# Patient Record
Sex: Female | Born: 1940 | Race: White | Hispanic: No | Marital: Married | Smoking: Never smoker
Health system: Southern US, Community
[De-identification: ages and names within clinical notes are randomized; demographics above are authoritative.]

## PROBLEM LIST (undated history)

## (undated) DIAGNOSIS — Z923 Personal history of irradiation: Secondary | ICD-10-CM

## (undated) DIAGNOSIS — Z9221 Personal history of antineoplastic chemotherapy: Secondary | ICD-10-CM

## (undated) DIAGNOSIS — I079 Rheumatic tricuspid valve disease, unspecified: Secondary | ICD-10-CM

## (undated) DIAGNOSIS — I10 Essential (primary) hypertension: Secondary | ICD-10-CM

## (undated) DIAGNOSIS — D649 Anemia, unspecified: Secondary | ICD-10-CM

## (undated) DIAGNOSIS — E785 Hyperlipidemia, unspecified: Secondary | ICD-10-CM

## (undated) DIAGNOSIS — H5030 Unspecified intermittent heterotropia: Secondary | ICD-10-CM

## (undated) DIAGNOSIS — E7800 Pure hypercholesterolemia, unspecified: Secondary | ICD-10-CM

## (undated) DIAGNOSIS — D508 Other iron deficiency anemias: Secondary | ICD-10-CM

## (undated) DIAGNOSIS — E039 Hypothyroidism, unspecified: Secondary | ICD-10-CM

## (undated) DIAGNOSIS — G7000 Myasthenia gravis without (acute) exacerbation: Secondary | ICD-10-CM

## (undated) DIAGNOSIS — M5116 Intervertebral disc disorders with radiculopathy, lumbar region: Secondary | ICD-10-CM

## (undated) DIAGNOSIS — Z853 Personal history of malignant neoplasm of breast: Secondary | ICD-10-CM

## (undated) DIAGNOSIS — Z96653 Presence of artificial knee joint, bilateral: Secondary | ICD-10-CM

## (undated) DIAGNOSIS — M5440 Lumbago with sciatica, unspecified side: Secondary | ICD-10-CM

## (undated) DIAGNOSIS — M1990 Unspecified osteoarthritis, unspecified site: Secondary | ICD-10-CM

## (undated) DIAGNOSIS — M5412 Radiculopathy, cervical region: Secondary | ICD-10-CM

## (undated) DIAGNOSIS — M5417 Radiculopathy, lumbosacral region: Secondary | ICD-10-CM

## (undated) DIAGNOSIS — I82401 Acute embolism and thrombosis of unspecified deep veins of right lower extremity: Secondary | ICD-10-CM

## (undated) DIAGNOSIS — Z01810 Encounter for preprocedural cardiovascular examination: Secondary | ICD-10-CM

## (undated) DIAGNOSIS — C50911 Malignant neoplasm of unspecified site of right female breast: Secondary | ICD-10-CM

## (undated) DIAGNOSIS — R7989 Other specified abnormal findings of blood chemistry: Secondary | ICD-10-CM

## (undated) DIAGNOSIS — E049 Nontoxic goiter, unspecified: Secondary | ICD-10-CM

## (undated) DIAGNOSIS — M8580 Other specified disorders of bone density and structure, unspecified site: Secondary | ICD-10-CM

## (undated) DIAGNOSIS — K2950 Unspecified chronic gastritis without bleeding: Secondary | ICD-10-CM

## (undated) MED ORDER — DICLOFENAC 1 % TOPICAL GEL
1 % | Freq: Four times a day (QID) | CUTANEOUS | Status: DC
Start: ? — End: 2014-02-10

## (undated) MED ORDER — WARFARIN 2 MG TAB
2 mg | ORAL_TABLET | ORAL | Status: DC
Start: ? — End: 2012-11-03

## (undated) MED ORDER — SYNTHROID 112 MCG TABLET
112 mcg | ORAL_TABLET | ORAL | Status: DC
Start: ? — End: 2013-05-06

## (undated) MED ORDER — HYDROCODONE-ACETAMINOPHEN 10-650 MG TAB
10-650 mg | ORAL_TABLET | ORAL | Status: DC | PRN
Start: ? — End: 2012-09-05

## (undated) MED ORDER — SYNTHROID 112 MCG TABLET
112 mcg | ORAL_TABLET | ORAL | Status: DC
Start: ? — End: 2013-05-14

## (undated) MED ORDER — HYDROCODONE-ACETAMINOPHEN 5 MG-325 MG TAB
5-325 mg | ORAL_TABLET | Freq: Four times a day (QID) | ORAL | Status: DC | PRN
Start: ? — End: 2012-09-05

## (undated) MED ORDER — LEVOTHYROXINE 112 MCG TAB
112 mcg | ORAL_TABLET | Freq: Every day | ORAL | Status: DC
Start: ? — End: 2013-07-30

## (undated) MED ORDER — COLESEVELAM 625 MG TAB
625 mg | ORAL_TABLET | Freq: Two times a day (BID) | ORAL | Status: DC
Start: ? — End: 2013-11-19

## (undated) MED ORDER — HYDROCODONE-ACETAMINOPHEN 10 MG-325 MG TAB
10-325 mg | ORAL_TABLET | ORAL | Status: DC | PRN
Start: ? — End: 2012-11-03

## (undated) MED ORDER — LEVOTHYROXINE 112 MCG TAB
112 mcg | ORAL_TABLET | ORAL | Status: DC
Start: ? — End: 2014-07-22

## (undated) MED ORDER — ONDANSETRON 4 MG TAB, RAPID DISSOLVE
4 mg | ORAL_TABLET | Freq: Three times a day (TID) | ORAL | Status: DC | PRN
Start: ? — End: 2012-11-03

## (undated) MED ORDER — SYNTHROID 112 MCG TABLET
112 mcg | ORAL_TABLET | Freq: Every day | ORAL | Status: DC
Start: ? — End: 2008-04-07

## (undated) MED ORDER — SYNTHROID 112 MCG TABLET
112 mcg | ORAL_TABLET | ORAL | Status: DC
Start: ? — End: 2012-11-03

## (undated) MED ORDER — MECLIZINE 25 MG TAB
25 mg | ORAL_TABLET | Freq: Three times a day (TID) | ORAL | Status: DC | PRN
Start: ? — End: 2012-08-15

## (undated) MED ORDER — MECLIZINE 25 MG TAB
25 mg | ORAL_TABLET | Freq: Three times a day (TID) | ORAL | Status: DC | PRN
Start: ? — End: 2012-11-03

## (undated) MED ORDER — DESONIDE 0.05 % LOTION
0.05 % | Freq: Two times a day (BID) | CUTANEOUS | Status: DC
Start: ? — End: 2013-05-06

## (undated) MED ORDER — DULOXETINE 30 MG CAP, DELAYED RELEASE
30 mg | ORAL_CAPSULE | Freq: Every day | ORAL | Status: DC
Start: ? — End: 2012-11-03

## (undated) MED ORDER — DESONIDE 0.05 % OINTMENT
0.05 % | Freq: Two times a day (BID) | CUTANEOUS | Status: AC
Start: ? — End: 2008-10-04

## (undated) MED ORDER — DESONIDE 0.05 % OINTMENT
0.05 % | Freq: Two times a day (BID) | CUTANEOUS | Status: DC
Start: ? — End: 2014-12-16

## (undated) MED ORDER — DICLOFENAC 1 % TOPICAL GEL
1 % | Freq: Four times a day (QID) | CUTANEOUS | Status: DC
Start: ? — End: 2012-09-05

## (undated) MED ORDER — WARFARIN 2 MG TAB
2 mg | ORAL_TABLET | ORAL | Status: DC
Start: ? — End: 2012-09-05

## (undated) MED ORDER — AZITHROMYCIN 250 MG TAB
250 mg | ORAL_TABLET | ORAL | Status: AC
Start: ? — End: 2013-07-04

---

## 1991-08-14 HISTORY — PX: REDUCTION MAMMAPLASTY: SUR839

## 2001-08-31 LAB — OCCULT BLOOD, STOOL: Occult Blood, External: NEGATIVE

## 2001-08-31 LAB — HM PAP SMEAR

## 2003-09-01 LAB — HM COLONOSCOPY: Colonoscopy, External: NEGATIVE

## 2005-08-31 LAB — HM DEXA SCAN

## 2006-08-31 LAB — HM MAMMOGRAPHY

## 2007-10-10 ENCOUNTER — Ambulatory Visit

## 2007-10-10 NOTE — Progress Notes (Signed)
HISTORY OF PRESENT ILLNESS  Jillian Bean is a 66 y.o. female.  HPI       Jillian Bean is seen for well-woman check.    Issues:  1.  Hypothyroidism, TSH 8.3 in December, dose increased to 0.112 MG from 0.1.  She feels well, energy is good.  Due for follow-up.     2.  Ongoing trouble with leg pain keeping her from normal exercise.  It was disrupting sleep in December.  Pain in right leg has improved with course of physical therapy.  MRI of spine showed mild disc bulge, L2-3 but nothing severe.  Dr. Lodema Hong did not think it was back etiology.  Feldene I gave her was helpful, but she is off of it now.  She still has trouble with right knee swelling.  She has seen Dr. Georgina Snell and wonders if she should pursue injections for the knee.  She still has limited exercise.     Preventative Care:  Bone density, December, 2007, T score of 0.3 in spine and -2.1 in hip.  She chose not to take Fosamax at that time.  She does take calcium and vitamin D.  Stress echo, August, 2003, normal.  Colonoscopy, July, 2005, Dr. Vivien Rossetti, normal.  Mammograms, January, 2009, negative.  Status post partial hysterectomy.  Pap smear, January, 2008, normal.  Tetanus booster, January, 2007.           REVIEW OF SYSTEMS  Review of Systems   Constitutional: Negative for fever, chills, weight loss, malaise/fatigue and diaphoresis.   HENT: Negative for hearing loss, ear pain, congestion, sore throat, neck pain and tinnitus.    Eyes: Negative for blurred vision and double vision.   Respiratory: Negative for cough, hemoptysis, sputum production, shortness of breath and wheezing.    Cardiovascular: Negative for chest pain, palpitations, orthopnea, claudication, leg swelling and PND.   Gastrointestinal: Negative for heartburn, nausea, vomiting, abdominal pain, diarrhea, constipation and blood in stool.   Genitourinary: Negative for dysuria, urgency, frequency, hematuria and flank pain.    Musculoskeletal: Positive for back pain and joint pain. Negative for myalgias.   Skin: Negative.    Neurological: Negative for dizziness, tingling, sensory change, speech change, focal weakness, seizures, loss of consciousness, weakness and headaches.   Endo/Heme/Allergies: Negative for environmental allergies and polydipsia.  Does not bruise/bleed easily.   Psychiatric/Behavioral: Negative for depression, suicidal ideas, memory loss and substance abuse.  The patient is not nervous/anxious and does not have insomnia.        PHYSICAL EXAM  Physical Exam   Nursing note and vitals reviewed.  Constitutional: She is oriented. She appears well-developed and well-nourished.   HENT:   Head: Normocephalic and atraumatic.   Right Ear: External ear normal.   Left Ear: External ear normal.   Nose: Nose normal.   Mouth/Throat: Oropharynx is clear and moist.   Eyes: Conjunctivae and extraocular motions are normal. Pupils are equal, round, and reactive to light.   Neck: Normal range of motion. Neck supple. Carotid bruit is not present. No thyromegaly present.   Cardiovascular: Normal rate, regular rhythm, S1 normal, S2 normal, normal heart sounds and intact distal pulses.    Pulmonary/Chest: Effort normal and breath sounds normal.   Abdominal: Soft. Normal appearance and bowel sounds are normal. There is no organomegaly. No tenderness.   Genitourinary:        Bilateral breast scars from prior surgery but no masses, axillary nodes or discharge   Musculoskeletal: Normal range of motion.  Right knee with swelling and crepitus but not red or warm   Neurological: She is alert and oriented.   Skin: Skin is warm, dry and intact.   Psychiatric: She has a normal mood and affect. Her behavior is normal. Judgment and thought content normal.       ASSESSMENT and PLAN  Jillian Bean was seen today for well woman.    Diagnoses and associated orders for this visit:    - Osteopenia  - Dexa w vertebral assessment -- Future    - Hypothyroidism   - Tsh -- Future  - Synthroid 112 mcg tab -- take 1 Tab by mouth daily.    - Eczema  - Desonide 0.05 % ointment -- apply 1 Each to affected area two (2) times a day. Use prn    Dyslipedemia-stable lipids indec on pravachol-will not repeat levels  Hemeoccult cards given  Arthritis-to see Dr Georgina Snell

## 2007-10-11 LAB — TSH 3RD GENERATION: TSH: 3.43 u[IU]/mL (ref 0.35–5.5)

## 2007-10-11 NOTE — Progress Notes (Signed)
Quick Note:    .not  ______

## 2008-01-08 NOTE — Progress Notes (Signed)
Quick Note:    Notified by letter.    ______

## 2008-04-07 MED ORDER — SYNTHROID 112 MCG TABLET
112 mcg | ORAL_TABLET | Freq: Every day | ORAL | Status: DC
Start: 2008-04-07 — End: 2008-04-15

## 2008-04-07 MED ORDER — PRAVASTATIN 20 MG TAB
20 mg | ORAL_TABLET | Freq: Every day | ORAL | Status: DC
Start: 2008-04-07 — End: 2008-11-05

## 2008-04-07 NOTE — Progress Notes (Signed)
HISTORY OF PRESENT ILLNESS  Jillian Bean is a 67 y.o. female.  HPI       Patient is seen for followup of hypothyroidism.   Thyroid ROS: denies fatigue, weight changes, heat/cold intolerance, bowel/skin changes or CVS symptoms. Notes hair loss has resolved in the last few months. She is tolerating pravachol with no myalgias and on co q 10.Notes redness of legs-nt painful or itchy.No new sxs. Did get steroid shots for knees and feels ok with aleve bid.      Review of Systems   Constitutional: Negative for fever, chills and weight loss.   Respiratory: Negative for cough, shortness of breath and wheezing.    Cardiovascular: Negative for chest pain, palpitations, orthopnea, leg swelling and PND.   Gastrointestinal: Negative for heartburn and nausea.   Musculoskeletal: Positive for joint pain (knees). Negative for myalgias.   Skin: Negative for rash and itching.   Neurological: Negative for dizziness and headaches.       Physical Exam   Nursing note and vitals reviewed.  Constitutional: She is oriented. She appears well-developed and well-nourished.   HENT:   Head: Normocephalic and atraumatic.   Neck: Normal range of motion. Neck supple. Carotid bruit is not present. No thyromegaly present.   Cardiovascular: Normal rate, regular rhythm, S1 normal, S2 normal, normal heart sounds and intact distal pulses.    No murmur heard.  Pulmonary/Chest: Effort normal and breath sounds normal. No respiratory distress. She has no wheezes. She has no rales.   Musculoskeletal: She exhibits no edema.   Neurological: She is alert and oriented.   Skin:        Mild venous stasis of legs, no edema and DP 2 plus   Psychiatric: She has a normal mood and affect. Her behavior is normal.       ASSESSMENT and PLAN  Jillian Bean was seen today for follow-up, medication refill and knee pain.    Diagnoses and associated orders for this visit:    - Hypercholesteremia  - Lipid panel -- Future  - Metabolic panel, comprehensive -- Future   - Pravastatin 20 mg tab -- Take 1 Tab by mouth daily.    - Oa (osteoarthritis)  - No associated orders    - Hypothyroidism  - Tsh -- Future  - Synthroid 112 mcg tab -- Take 1 Tab by mouth daily.    - Venous stasis  Reassured re diagnosis, reviewed preventive measures  appt in 6 mo

## 2008-04-15 ENCOUNTER — Encounter

## 2008-04-15 MED ORDER — SYNTHROID 112 MCG TABLET
112 mcg | ORAL_TABLET | Freq: Every day | ORAL | Status: DC
Start: 2008-04-15 — End: 2008-11-05

## 2008-04-21 NOTE — Telephone Encounter (Signed)
Haven't heard anything about recent lab results [phone]

## 2008-04-21 NOTE — Telephone Encounter (Signed)
Advised I would find out about labs and call her back.

## 2008-04-22 NOTE — Telephone Encounter (Signed)
stephaine in lab notified we have missing labs she will work on getting the results.

## 2008-04-28 LAB — METABOLIC PANEL, COMPREHENSIVE
A-G Ratio: 1.8 (ref 1.1–2.2)
ALT (SGPT): 30 U/L (ref 30–65)
AST (SGOT): 17 U/L (ref 15–37)
Albumin: 4.2 g/dL (ref 3.5–5.0)
Alk. phosphatase: 99 U/L (ref 50–136)
Anion gap: 8 mmol/L (ref 5–15)
BUN/Creatinine ratio: 14 (ref 12–20)
BUN: 14 MG/DL (ref 6–20)
Bilirubin, total: 0.5 MG/DL (ref <1.0)
CO2: 29 MMOL/L (ref 21–32)
Calcium: 9 MG/DL (ref 8.5–10.1)
Chloride: 105 MMOL/L (ref 97–108)
Creatinine: 1 MG/DL (ref 0.6–1.3)
GFR est AA: >60 mL/min/{1.73_m2} (ref >60)
GFR est non-AA: 59 mL/min/{1.73_m2} — ABNORMAL LOW (ref >60)
Globulin: 2.4 g/dL (ref 2.0–4.0)
Glucose: 93 MG/DL (ref 50–100)
Potassium: 4.5 MMOL/L (ref 3.5–5.1)
Protein, total: 6.6 g/dL (ref 6.4–8.4)
Sodium: 142 MMOL/L (ref 136–145)

## 2008-04-28 LAB — TSH 3RD GENERATION: TSH: 6.25 u[IU]/mL — ABNORMAL HIGH (ref 0.35–5.5)

## 2008-04-28 LAB — LIPID PANEL
CHOL/HDL Ratio: 3.7 (ref 0–5.0)
Cholesterol, total: 200 MG/DL — ABNORMAL HIGH (ref <200)
HDL Cholesterol: 54 MG/DL (ref 40–60)
LDL, calculated: 125.6 MG/DL — ABNORMAL HIGH (ref 0–100)
Triglyceride: 102 MG/DL (ref 30–200)
VLDL, calculated: 20.4 MG/DL

## 2008-04-29 NOTE — Progress Notes (Signed)
Quick Note:    Please let her know on current thryoid dose of 112 mcg she is slightly under-replaced-advise inc to 8 pills per week and repeat level in 3 mo-other labs are ok  ______

## 2008-04-30 NOTE — Progress Notes (Signed)
Quick Note:    Notified by letter.    ______

## 2008-05-03 NOTE — Progress Notes (Signed)
Quick Note:    Patient notified by phone and agrees to plan  ______

## 2008-07-30 ENCOUNTER — Ambulatory Visit

## 2008-07-30 MED ORDER — FEXOFENADINE 180 MG TAB
180 mg | ORAL_TABLET | Freq: Every day | ORAL | Status: DC
Start: 2008-07-30 — End: 2008-11-05

## 2008-07-30 NOTE — Progress Notes (Signed)
HISTORY OF PRESENT ILLNESS  Jillian Bean is a 67 y.o. female.  HPI       Patient is seen for followup of hypothyroidism.   Thyroid ROS: denies fatigue, weight changes, heat/cold intolerance, bowel/skin changes or CVS symptoms. Her tsh was elevated in September and she is now on synthroid 112 8 days per week.Has noted better hair less hair loss with this dose. She is having left knee pain and has djd and is controlling with aleve bid and tylenol prn. Needs allegra refill which works well.       Review of Systems   Constitutional: Negative for fever, chills and weight loss.   Respiratory: Negative for cough, shortness of breath and wheezing.    Cardiovascular: Negative for chest pain, palpitations, orthopnea, leg swelling and PND.   Gastrointestinal: Negative for heartburn and nausea.   Musculoskeletal: Positive for joint pain. Negative for myalgias.   Neurological: Negative for dizziness and headaches.       Physical Exam   Nursing note and vitals reviewed.  Constitutional: She is oriented. She appears well-developed and well-nourished.   HENT:   Head: Normocephalic and atraumatic.   Neck: Normal range of motion. Neck supple. Carotid bruit is not present. No thyromegaly present.   Cardiovascular: Normal rate, regular rhythm, S1 normal, S2 normal, normal heart sounds and intact distal pulses.    No murmur heard.  Pulmonary/Chest: Effort normal and breath sounds normal. No respiratory distress. She has no wheezes. She has no rales.   Musculoskeletal: She exhibits no edema.   Neurological: She is alert and oriented.   Psychiatric: She has a normal mood and affect. Her behavior is normal.       ASSESSMENT and PLAN  Jillian Bean was seen today for thyroid problem, cholesterol problem and medication refill.    Diagnoses and associated orders for this visit:    Hypothyroid  - TSH, 3RD GENERATION; Future    Hypercholesterolemia  - LIPID PANEL; Future  - ALT; Future  - AST; Future     Oa (osteoarthritis)-Dr Kiritsis suggested for her knee-she anticipates needing surgery-still does the elliptical     Allergic rhinitis  - fexofenadine (ALLEGRA) tablet; Take 1 Tab by mouth daily.    Other Orders  - COENZYME Q10 10 MG CAP; Take  by mouth.    appt in 6 mo sooner prn

## 2008-07-31 LAB — LIPID PANEL
CHOL/HDL Ratio: 3.1 (ref 0–5.0)
Cholesterol, total: 182 MG/DL (ref <200)
HDL Cholesterol: 58 MG/DL (ref 40–60)
LDL, calculated: 107.8 MG/DL — ABNORMAL HIGH (ref 0–100)
Triglyceride: 81 MG/DL (ref 30–200)
VLDL, calculated: 16.2 MG/DL

## 2008-07-31 LAB — ALT: ALT (SGPT): 32 U/L (ref 30–65)

## 2008-07-31 LAB — AST: AST (SGOT): 18 U/L (ref 15–37)

## 2008-07-31 LAB — TSH 3RD GENERATION: TSH: 2.2 u[IU]/mL (ref 0.35–5.5)

## 2008-07-31 NOTE — Progress Notes (Signed)
Quick Note:    Notified by letter.    ______

## 2008-09-08 NOTE — Progress Notes (Addendum)
Quick Note:    Normal paper letter sent  ______

## 2008-11-05 ENCOUNTER — Ambulatory Visit

## 2008-11-05 LAB — LIPID PANEL
CHOL/HDL Ratio: 3 (ref 0–5.0)
Cholesterol, total: 186 MG/DL (ref <200)
HDL Cholesterol: 61 MG/DL — ABNORMAL HIGH (ref 40–60)
LDL, calculated: 106 MG/DL — ABNORMAL HIGH (ref 0–100)
Triglyceride: 95 MG/DL (ref 30–200)
VLDL, calculated: 19 MG/DL

## 2008-11-05 LAB — TSH 3RD GENERATION: TSH: 0.53 u[IU]/mL (ref 0.36–3.74)

## 2008-11-05 LAB — METABOLIC PANEL, COMPREHENSIVE
A-G Ratio: 1.6 (ref 1.1–2.2)
ALT (SGPT): 37 U/L (ref 30–65)
AST (SGOT): 15 U/L (ref 15–37)
Albumin: 3.9 g/dL (ref 3.5–5.0)
Alk. phosphatase: 88 U/L (ref 50–136)
Anion gap: 9 mmol/L (ref 5–15)
BUN/Creatinine ratio: 21 — ABNORMAL HIGH (ref 12–20)
BUN: 17 MG/DL (ref 6–20)
Bilirubin, total: 0.3 MG/DL (ref 0.2–1.0)
CO2: 30 MMOL/L (ref 21–32)
Calcium: 8.8 MG/DL (ref 8.5–10.1)
Chloride: 108 MMOL/L (ref 97–108)
Creatinine: 0.8 MG/DL (ref 0.6–1.3)
GFR est AA: >60 mL/min/{1.73_m2} (ref >60)
GFR est non-AA: >60 mL/min/{1.73_m2} (ref >60)
Globulin: 2.5 g/dL (ref 2.0–4.0)
Glucose: 87 MG/DL (ref 50–100)
Potassium: 4.5 MMOL/L (ref 3.5–5.1)
Protein, total: 6.4 g/dL (ref 6.4–8.4)
Sodium: 147 MMOL/L — ABNORMAL HIGH (ref 136–145)

## 2008-11-05 LAB — CBC WITH AUTOMATED DIFF
ABS. BASOPHILS: 0 10*3/uL (ref 0.0–0.1)
ABS. EOSINOPHILS: 0.1 10*3/uL (ref 0.0–0.4)
ABS. LYMPHOCYTES: 1.1 10*3/uL (ref 0.8–3.5)
ABS. MONOCYTES: 0.4 10*3/uL (ref 0.0–1.0)
ABS. NEUTROPHILS: 3.4 10*3/uL (ref 1.8–8.0)
BASOPHILS: 0 % (ref 0–1)
EOSINOPHILS: 1 % (ref 0–7)
HCT: 36.5 % (ref 35.0–47.0)
HGB: 11.6 g/dL (ref 11.5–16.0)
LYMPHOCYTES: 23 % (ref 12–49)
MCH: 26.4 PG (ref 26.0–34.0)
MCHC: 31.8 g/dL (ref 30.0–36.5)
MCV: 83 FL (ref 80.0–99.0)
MONOCYTES: 7 % (ref 5–13)
NEUTROPHILS: 69 % (ref 32–75)
PLATELET: 346 10*3/uL (ref 150–400)
RBC: 4.4 M/uL (ref 3.80–5.20)
RDW: 14.4 % (ref 11.5–14.5)
WBC: 5 10*3/uL (ref 3.6–11.0)

## 2008-11-05 LAB — URINALYSIS W/ RFLX MICROSCOPIC
Bilirubin: NEGATIVE
Blood: NEGATIVE
Glucose: NEGATIVE MG/DL
Ketone: NEGATIVE MG/DL
Leukocyte Esterase: NEGATIVE
Nitrites: NEGATIVE
Protein: NEGATIVE MG/DL
Specific gravity: 1.018 (ref 1.003–1.030)
Urobilinogen: 0.2 EU/DL (ref 0.2–1.0)
pH (UA): 5.5 (ref 5.0–8.0)

## 2008-11-05 MED ORDER — PRAVASTATIN 20 MG TAB
20 mg | ORAL_TABLET | Freq: Every day | ORAL | Status: DC
Start: 2008-11-05 — End: 2009-09-08

## 2008-11-05 MED ORDER — SYNTHROID 112 MCG TABLET
112 mcg | ORAL_TABLET | Freq: Every day | ORAL | Status: DC
Start: 2008-11-05 — End: 2009-09-08

## 2008-11-05 MED ORDER — FEXOFENADINE 180 MG TAB
180 mg | ORAL_TABLET | Freq: Every day | ORAL | Status: DC
Start: 2008-11-05 — End: 2009-09-08

## 2008-11-05 NOTE — Progress Notes (Signed)
HISTORY OF PRESENT ILLNESS  Jillian Bean is a 68 y.o. female.  HPI  Jillian Bean is seen for a general physical and follow up on thyroid.     Preventive Care: DEXA in May 2009, T score of -.5 in spine, -2.1 in the hip, this was actually improved from prior study.  She gets regular mammograms and Pap smears, over the last years have all been normal.  Colonoscopy 2005 Dr. Vivien Rossetti, due for follow up this summer.  Tetanus booster 2007, Pneumovax 2007.    Second Issue:  Knee pain, did see Dr. Dorina Hoyer, had steroid shots which were helpful.  Thinking about SynVisc injections.  She notes occasional tingling in her feet and some trouble with hard stools, otherwise feels well.     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  She is married.  No tobacco, no alcohol.        Review of Systems   Gastrointestinal: Positive for constipation.   Neurological: Positive for tingling (in feet occasionally).   All other systems reviewed and are negative.        Physical Exam   Nursing note and vitals reviewed.  Constitutional: She is oriented. She appears well-developed and well-nourished.   HENT:   Head: Normocephalic and atraumatic.   Right Ear: External ear normal.   Left Ear: External ear normal.   Nose: Nose normal.   Mouth/Throat: Oropharynx is clear and moist.   Eyes: Conjunctivae and extraocular motions are normal. Pupils are equal, round, and reactive to light.   Neck: Normal range of motion. Neck supple. Carotid bruit is not present. No thyromegaly present.   Cardiovascular: Normal rate, regular rhythm, S1 normal, S2 normal, normal heart sounds and intact distal pulses.    Pulmonary/Chest: Effort normal and breath sounds normal.   Abdominal: Soft. Normal appearance and bowel sounds are normal. She exhibits no mass. There is no organomegaly. No tenderness.   Genitourinary:        Breast exam no masses axillary nodes or discharge    Musculoskeletal: Normal range of motion. She exhibits no edema and no tenderness.        oa changes of knees bialterally   Lymphadenopathy:     She has no cervical adenopathy.   Neurological: She is alert and oriented. She has normal reflexes. Coordination normal.   Skin: Skin is warm, dry and intact.   Psychiatric: She has a normal mood and affect. Her behavior is normal. Judgment and thought content normal.       ASSESSMENT and PLAN  Jillian Bean was seen today for physical, tingling, constipation and medication refill.    Diagnoses and associated orders for this visit:    Well woman exam (no gynecological exam)  - CBC W/ AUTOMATED DIFF; Future  - METABOLIC PANEL, COMPREHENSIVE; Future  - URINALYSIS W/ RFLX MICROSCOPIC; Future    Hypercholesteremia  - LIPID PANEL; Future  - pravastatin (PRAVACHOL) tablet; Take 1 Tab by mouth daily.    Oa (osteoarthritis)    Hypothyroidism  - TSH, 3RD GENERATION; Future  - SYNTHROID 112 MCG TAB; Take 1 Tab by mouth daily.    Doe (dyspnea on exertion)-mild and not significantly different from last year-ekg ok  - AMB POC EKG ROUTINE W/ 12 LEADS, INTER & REP  - XR CHEST PA AND LATERAL; Future    Allergic rhinitis  - fexofenadine (ALLEGRA) tablet; Take 1 Tab by mouth daily.

## 2008-11-07 NOTE — Progress Notes (Signed)
Quick Note:    Can you notify her of normal cxr  ______

## 2008-11-08 NOTE — Progress Notes (Signed)
Quick Note:    Patient notified by phone  ______

## 2008-11-13 NOTE — Progress Notes (Signed)
Quick Note:    Notified by letter.    ______

## 2009-02-23 MED ORDER — MELOXICAM 7.5 MG TAB
7.5 mg | ORAL_TABLET | Freq: Every day | ORAL | Status: DC
Start: 2009-02-23 — End: 2009-04-27

## 2009-02-23 NOTE — Progress Notes (Signed)
HISTORY OF PRESENT ILLNESS  Jillian Bean is a 68 y.o. female.  HPI  Notes started water aerobics in may-was having pain in the back of her legs and did see Dr Jillian Bean who treated her with steroids which helped he advised mri but she did not do this due to anxiety. Does note her legs are better although has known djd of knees and has seen Dr Jillian Bean who offered TKR-she tried 1/2 of a 75 mg diclofenac bid and this helped the swelling but not the pain over 1 week of use.Feldene in the past made her feel fluttery and red face. Aleve helps sxs-no falls.  Review of Systems   HENT: Positive for neck pain.    Gastrointestinal: Negative for heartburn, nausea, vomiting, abdominal pain and blood in stool.   Musculoskeletal: Positive for back pain. Negative for falls.       Physical Exam   Nursing note and vitals reviewed.  Constitutional: She is oriented to person, place, and time. She appears well-developed and well-nourished.   HENT:   Head: Normocephalic and atraumatic.   Neck: Normal range of motion. Neck supple. Carotid bruit is not present. No thyromegaly present.   Cardiovascular: Normal rate, regular rhythm, S1 normal, S2 normal, normal heart sounds and intact distal pulses.    No murmur heard.  Pulmonary/Chest: Effort normal and breath sounds normal. No respiratory distress. She has no wheezes. She has no rales.   Musculoskeletal: Normal range of motion. She exhibits no edema and no tenderness.   Neurological: She is alert and oriented to person, place, and time. She has normal reflexes. Coordination normal.   Psychiatric: She has a normal mood and affect. Her behavior is normal.       ASSESSMENT and PLAN  Jillian Bean was seen today for neck pain.    Diagnoses and associated orders for this visit:    Neck pain-suspect djd as present in knees and back-no red flags of disc rupture-trial of low dose mobic  Side effects possible reviewed in detail    - XR SPINE CERV SINGLE VIEW CROSS TABLE LAT; Future   - meloxicam (MOBIC) tablet; Take 1 Tab by mouth daily for 360 days.    Other Orders  - TUMS PO; Take  by mouth.    Follow up if signs and symptoms worsen or change. After hours number given.

## 2009-03-01 NOTE — Progress Notes (Signed)
Quick Note:    Notify nothing acute on cervical spine films  ______

## 2009-03-03 NOTE — Progress Notes (Signed)
Quick Note:    Notified husband of djd -trial of mobic and appt if not helping  ______

## 2009-03-04 NOTE — Progress Notes (Signed)
Quick Note:    Patient husband notified by phone  ______

## 2009-04-27 ENCOUNTER — Ambulatory Visit

## 2009-04-27 NOTE — Progress Notes (Signed)
HISTORY OF PRESENT ILLNESS  Jillian Bean is a 68 y.o. female.  HPI    1. Seen at follow up concerning hypothyroidism.  She is on 112 mcg dose but two tabs on Sunday.  She notes increased hair loss recently and is worried this is from the thyroid.  2. She has hypercholesterolemia and is tolerating Pravachol 20 mg daily.  Due for levels, no myalgias.  3. Osteoarthritis of knees.  Has had cortisone injection by Dr. Dorina Hoyer.  Mobic did not help as much as Aleve does.  She is anticipating joint replacement in the new year.    MedDATA/jal      Review of Systems   Constitutional: Negative for fever, chills and weight loss.   Respiratory: Negative for cough, shortness of breath and wheezing.    Cardiovascular: Negative for chest pain, palpitations, orthopnea, leg swelling and PND.   Gastrointestinal: Negative for heartburn, nausea and abdominal pain.   Musculoskeletal: Positive for joint pain. Negative for myalgias.   Neurological: Negative for dizziness and headaches.       Physical Exam   Nursing note and vitals reviewed.  Constitutional: She is oriented to person, place, and time. She appears well-developed and well-nourished.   HENT:   Head: Normocephalic and atraumatic.   Neck: Normal range of motion. Neck supple. Carotid bruit is not present. No thyromegaly present.   Cardiovascular: Normal rate, regular rhythm, S1 normal, S2 normal, normal heart sounds and intact distal pulses.    No murmur heard.  Pulmonary/Chest: Effort normal and breath sounds normal. No respiratory distress. She has no wheezes. She has no rales.   Musculoskeletal: She exhibits no edema.   Neurological: She is alert and oriented to person, place, and time.   Psychiatric: She has a normal mood and affect. Her behavior is normal.       ASSESSMENT and PLAN  Jillian Bean was seen today for follow-up, thyroid problem and cholesterol problem.    Diagnoses and associated orders for this visit:    Oa (osteoarthritis)    Hypothyroidism   - TSH, 3RD GENERATION; Future    Hypercholesterolemia  - LIPID PANEL; Future  - METABOLIC PANEL, COMPREHENSIVE; Future    Other Orders  - naproxen sodium (ALEVE) 220 mg tablet; Take 220 mg by mouth two (2) times daily (with meals).    Side effects possible reviewed in detail  appt in 84mo sooner prn  Flu shot advised this fall.

## 2009-04-28 LAB — LIPID PANEL
CHOL/HDL Ratio: 3.3 (ref 0–5.0)
Cholesterol, total: 197 MG/DL (ref <200)
HDL Cholesterol: 60 MG/DL
LDL, calculated: 113.2 MG/DL — ABNORMAL HIGH (ref 0–100)
Triglyceride: 119 MG/DL (ref <150)
VLDL, calculated: 23.8 MG/DL

## 2009-04-28 LAB — METABOLIC PANEL, COMPREHENSIVE
A-G Ratio: 1.2 (ref 1.1–2.2)
ALT (SGPT): 25 U/L (ref 12–78)
AST (SGOT): 20 U/L (ref 15–37)
Albumin: 4.1 g/dL (ref 3.5–5.0)
Alk. phosphatase: 101 U/L (ref 50–136)
Anion gap: 9 mmol/L (ref 5–15)
BUN/Creatinine ratio: 16 (ref 12–20)
BUN: 14 MG/DL (ref 6–20)
Bilirubin, total: 0.3 MG/DL (ref 0.2–1.0)
CO2: 28 MMOL/L (ref 21–32)
Calcium: 8.8 MG/DL (ref 8.5–10.1)
Chloride: 102 MMOL/L (ref 97–108)
Creatinine: 0.9 MG/DL (ref 0.6–1.3)
GFR est AA: >60 mL/min/{1.73_m2} (ref >60)
GFR est non-AA: >60 mL/min/{1.73_m2} (ref >60)
Globulin: 3.4 g/dL (ref 2.0–4.0)
Glucose: 112 MG/DL — ABNORMAL HIGH (ref 65–100)
Potassium: 4.2 MMOL/L (ref 3.5–5.1)
Protein, total: 7.5 g/dL (ref 6.4–8.2)
Sodium: 139 MMOL/L (ref 136–145)

## 2009-04-28 LAB — TSH 3RD GENERATION: TSH: 2.17 u[IU]/mL (ref 0.36–3.74)

## 2009-04-29 NOTE — Progress Notes (Signed)
Quick Note:    Notified by letter.    ______

## 2009-05-16 MED ORDER — ESOMEPRAZOLE MAGNESIUM 40 MG CAP, DELAYED RELEASE
40 mg | ORAL_CAPSULE | Freq: Every day | ORAL | Status: DC
Start: 2009-05-16 — End: 2009-07-13

## 2009-05-16 NOTE — Progress Notes (Signed)
HISTORY OF PRESENT ILLNESS  Jillian Bean is a 68 y.o. female.  HPI  Seen with GI symptoms. Notes about a week and a-half ago before traveling she was having some epigastric discomfort,  heartburn.  Took Pepto Bismol and the stools got dark, but then this cleared.  She has had some lower abdominal cramping. No blood in stool.  No nausea/vomiting, hematemesis, no fever or chills, no dysuria or hematuria.  She does take Aleve for osteoarthritis.  She has been on no recent antibiotics.  He appetite is good.     MedDATA/rac        Review of Systems   Constitutional: Negative for fever, chills and weight loss.   Gastrointestinal: Positive for heartburn and abdominal pain. Negative for vomiting, diarrhea, blood in stool and melena.   Genitourinary: Negative for dysuria and hematuria.       Physical Exam   Nursing note and vitals reviewed.  Constitutional: She is oriented to person, place, and time. She appears well-developed and well-nourished.   HENT:   Head: Normocephalic and atraumatic.   Neck: Normal range of motion. Neck supple.   Cardiovascular: Normal rate, regular rhythm, S1 normal, S2 normal, normal heart sounds and intact distal pulses.    No murmur heard.  Pulmonary/Chest: Effort normal and breath sounds normal. No respiratory distress. She has no wheezes. She has no rales.   Abdominal: Soft. Bowel sounds are normal. She exhibits no distension and no mass. Tenderness (mild in rlq and epigastrium) is present. She has no rebound.   Musculoskeletal: She exhibits no edema.   Neurological: She is alert and oriented to person, place, and time.   Psychiatric: She has a normal mood and affect. Her behavior is normal.       ASSESSMENT and PLAN  Jillian Bean was seen today for epigastric pain.    Diagnoses and associated orders for this visit:    Gerd (gastroesophageal reflux disease)-stop the aleve and start nexium  - esomeprazole (NEXIUM) 40 mg capsule; Take 1 Cap by mouth daily for 360 days.     Abdominal cramping-may be from above-colonoscopy 2 mo ago was normal and no diverticulosis seen    Oa (osteoarthritis)-tylenol prn only  Follow up if signs and symptoms worsen or change. After hours number given.

## 2009-05-26 NOTE — Telephone Encounter (Signed)
[facility] 05-26-09 9:57am mhouchens Dr Darl Pikes pt dob [id number]-2041 pt requesting the name and phone number of a neurologist. Best number to reach pt (h) [phone]

## 2009-05-26 NOTE — Telephone Encounter (Signed)
epps and gatusalo numbers given

## 2009-07-08 NOTE — Telephone Encounter (Signed)
Called pt left a message for her to contact office regarding symptoms. Left a message.

## 2009-07-13 NOTE — Telephone Encounter (Signed)
Pt called to state she stopped nexium due to possible diarrhea from that med. She will have knee surgery by Dr. Dorina Hoyer and will this office if pre op clearance is need for January surgery. Pt thinks ortho will be doing that but will call to clarify.

## 2009-07-13 NOTE — Telephone Encounter (Addendum)
I am happy to see her for preop if she needs clearance from me she needs appt-thanks

## 2009-07-25 ENCOUNTER — Encounter

## 2009-08-18 LAB — METABOLIC PANEL, COMPREHENSIVE
A-G Ratio: 1.1 (ref 1.1–2.2)
ALT (SGPT): 33 U/L (ref 12–78)
AST (SGOT): 18 U/L (ref 15–37)
Albumin: 3.9 g/dL (ref 3.5–5.0)
Alk. phosphatase: 110 U/L (ref 50–136)
Anion gap: 7 mmol/L (ref 5–15)
BUN/Creatinine ratio: 12 (ref 12–20)
BUN: 11 MG/DL (ref 6–20)
Bilirubin, total: 0.2 MG/DL (ref 0.2–1.0)
CO2: 32 MMOL/L (ref 21–32)
Calcium: 9.4 MG/DL (ref 8.5–10.1)
Chloride: 101 MMOL/L (ref 97–108)
Creatinine: 0.9 MG/DL (ref 0.6–1.3)
GFR est AA: >60 mL/min/{1.73_m2} (ref >60)
GFR est non-AA: >60 mL/min/{1.73_m2} (ref >60)
Globulin: 3.5 g/dL (ref 2.0–4.0)
Glucose: 111 MG/DL — ABNORMAL HIGH (ref 65–100)
Potassium: 3.5 MMOL/L (ref 3.5–5.1)
Protein, total: 7.4 g/dL (ref 6.4–8.2)
Sodium: 140 MMOL/L (ref 136–145)

## 2009-08-18 LAB — URINALYSIS W/ RFLX MICROSCOPIC
Bilirubin: NEGATIVE
Blood: NEGATIVE
Glucose: NEGATIVE MG/DL
Ketone: NEGATIVE MG/DL
Leukocyte Esterase: NEGATIVE
Nitrites: NEGATIVE
Protein: NEGATIVE MG/DL
Specific gravity: 1.011 (ref 1.003–1.030)
Urobilinogen: 0.2 EU/DL (ref 0.2–1.0)
pH (UA): 6.5 (ref 5.0–8.0)

## 2009-08-18 LAB — CBC WITH AUTOMATED DIFF
ABS. BASOPHILS: 0 10*3/uL (ref 0.0–0.1)
ABS. EOSINOPHILS: 0.2 10*3/uL (ref 0.0–0.4)
ABS. LYMPHOCYTES: 1.2 10*3/uL (ref 0.8–3.5)
ABS. MONOCYTES: 0.3 10*3/uL (ref 0.0–1.0)
ABS. NEUTROPHILS: 4.5 10*3/uL (ref 1.8–8.0)
BASOPHILS: 1 % (ref 0–1)
EOSINOPHILS: 3 % (ref 0–7)
HCT: 35.1 % (ref 35.0–47.0)
HGB: 10.9 g/dL — ABNORMAL LOW (ref 11.5–16.0)
LYMPHOCYTES: 19 % (ref 12–49)
MCH: 26.1 PG (ref 26.0–34.0)
MCHC: 31.1 g/dL (ref 30.0–36.5)
MCV: 84.2 FL (ref 80.0–99.0)
MONOCYTES: 5 % (ref 5–13)
NEUTROPHILS: 72 % (ref 32–75)
PLATELET: 360 10*3/uL (ref 150–400)
RBC: 4.17 M/uL (ref 3.80–5.20)
RDW: 13.6 % (ref 11.5–14.5)
WBC: 6.2 10*3/uL (ref 3.6–11.0)

## 2009-08-18 LAB — PROTHROMBIN TIME + INR
INR: 1 (ref 0.9–1.1)
Prothrombin time: 10.4 SECS (ref 9.0–11.0)

## 2009-08-18 LAB — PTT: aPTT: 28.7 s (ref 24.0–33.0)

## 2009-08-19 LAB — CULTURE, MRSA

## 2009-08-20 LAB — CULTURE, URINE
Colonies Counted: <1000
Colony Count: <1000
Culture result:: NO GROWTH
Culture: NO GROWTH

## 2009-08-22 LAB — HGB & HCT
HCT: 30.4 % — ABNORMAL LOW (ref 35.0–47.0)
HGB: 9.6 g/dL — ABNORMAL LOW (ref 11.5–16.0)

## 2009-08-22 NOTE — Op Note (Signed)
Op  Notes filed by Delfin Edis, MD at 08/24/09 0740                Author: Delfin Edis, MD  Service: --  Author Type: Physician       Filed: 08/24/09 0740  Date of Service: 08/22/09 1225  Status: Addendum          Editor: Delfin Edis, MD (Physician)          <!--EPICS--> Name:      Jillian Bean, Jillian Bean<BR> MR #:      [id number]                    Surgeon:        Waynette Buttery, M.D.<BR> Account #: [id number]                  Surgery Date:   08/22/2009<BR> DOB:       Sep 10, 1940<BR> Age:       69                           Location:       [id number]<BR> <BR>                              OPERATIVE REPORT<BR> <BR> <BR> PREOPERATIVE DIAGNOSIS:  Left knee osteoarthritis.<BR>  <BR> POSTOPERATIVE DIAGNOSIS:  Left knee osteoarthritis.<BR> <BR> OPERATIVE PROCEDURE:  Left Biomet Vanguard total knee arthroplasty with<BR> Bean-poly.<BR> <BR> SURGEON:  Blima Rich Kaion Tisdale, MD<BR> <BR> ASSISTANT:  Noah Charon, NP<BR> <BR> ANESTHESIA:   General with continuous femoral block.<BR> <BR> COMPLICATIONS:  None.<BR> <BR> ESTIMATED BLOOD LOSS:  5 mL.<BR> <BR> INDICATION:  The patient is a 69 year old female who has had a long history<BR> of left knee pain. She has failed conservative treatment  and presents for<BR> definitive operative care.<BR> <BR> PROCEDURE:  After being informed of the risks and benefits, which include,<BR> but are not limited to, bleeding, infection, neurovascular damage, wound<BR> complications, and pain and stiffness  of the knee, the patient consented<BR> for the procedure.<BR> <BR> With adequate general anesthesia, the left knee was prepped and draped in a<BR> sterile fashion. A standard midline incision was then made and a medial<BR> arthrotomy was performed. The  patella was everted and 9 mm was resected to<BR> accommodate a 34-mm patellar button. Three lug holes were then drilled and<BR> a patella trial was then placed. External tibial alignment guide was then<BR> brought proximally and pinned into  position.  The tibial cut was performed<BR> with the oscillating saw. The cut portion of the tibia was removed, and the<BR> tibia was sized to a 71. The distal femur was then entered with a drill and<BR> the distal femoral alignment rod and cutting block were pinned  into<BR> position. The distal femoral cut was then performed with the oscillating<BR> saw. The femur was sized to a 70 and the 4-in-1 cutting block and box<BR> cutting guides were used to finalize preparation of the femur. The trials<BR> were then placed  and with a 16-mm bearing shift, she had full extension,<BR> full flexion, and equal ligamentous balancing throughout the range of<BR> motion. We ended up with a 16 because we did have to re-cut the tibia. It<BR> was initially cut in a slight bit of valgus,  and we wanted to make sure<BR> that we had her anatomic. The appropriate rotation of the tibia was marked,<BR> and the keel punch was used to finalize the preparation  of the proximal<BR> tibia. All bony surfaces were then irrigated and dried meticulously.<BR>  <BR> The final implants were then cemented into position using third generation<BR> cementing techniques. The final 16-mm Bean-poly bearing was clipped into<BR> position on a clean and dry tibial blase plate. The knee was then irrigated<BR> and the extensor  retinaculum was then closed. The skin was closed in<BR> layers. Sterile dressings were applied, and the patient was awakened and<BR> taken to the recovery room in stable condition. There were no<BR> complications.<BR> <BR> <BR> <BR> <BR> Bean-Signed By<BR>  Waynette Buttery, M.D. 08/24/2009 07:40<BR> Waynette Buttery, M.D.<BR> <BR> cc:   Waynette Buttery, M.D.<BR> <BR> <BR> <BR> PGK/WMX; D: 08/22/2009  9:42 A; T: 08/22/2009 12:25 P; Doc# [id number]; Job#<BR> [id number]<BJ> <!--EPICE-->

## 2009-08-22 NOTE — Op Note (Addendum)
Name: Jillian Bean, Jillian Bean  MR #: [id number] Surgeon: Waynette Buttery, M.D.  Account #: [id number] Surgery Date: 08/22/2009  DOB: 01-Aug-1941  Age: 69 Location: [id number]     OPERATIVE REPORT      PREOPERATIVE DIAGNOSIS: Left knee osteoarthritis.    POSTOPERATIVE DIAGNOSIS: Left knee osteoarthritis.    OPERATIVE PROCEDURE: Left Biomet Vanguard total knee arthroplasty with  E-poly.    SURGEON: Gena Fray, MD    ASSISTANT: Noah Charon, NP    ANESTHESIA: General with continuous femoral block.    COMPLICATIONS: None.    ESTIMATED BLOOD LOSS: 5 mL.    INDICATION: The patient is a 69 year old female who has had a long history  of left knee pain. She has failed conservative treatment and presents for  definitive operative care.    PROCEDURE: After being informed of the risks and benefits, which include,  but are not limited to, bleeding, infection, neurovascular damage, wound  complications, and pain and stiffness of the knee, the patient consented  for the procedure.    With adequate general anesthesia, the left knee was prepped and draped in a  sterile fashion. A standard midline incision was then made and a medial  arthrotomy was performed. The patella was everted and 9 mm was resected to  accommodate a 34-mm patellar button. Three lug holes were then drilled and  a patella trial was then placed. External tibial alignment guide was then  brought proximally and pinned into position. The tibial cut was performed  with the oscillating saw. The cut portion of the tibia was removed, and the  tibia was sized to a 71. The distal femur was then entered with a drill and  the distal femoral alignment rod and cutting block were pinned into  position. The distal femoral cut was then performed with the oscillating  saw. The femur was sized to a 70 and the 4-in-1 cutting block and box  cutting guides were used to finalize preparation of the femur. The trials   were then placed and with a 16-mm bearing shift, she had full extension,  full flexion, and equal ligamentous balancing throughout the range of  motion. We ended up with a 16 because we did have to re-cut the tibia. It  was initially cut in a slight bit of valgus, and we wanted to make sure  that we had her anatomic. The appropriate rotation of the tibia was marked,  and the keel punch was used to finalize the preparation of the proximal  tibia. All bony surfaces were then irrigated and dried meticulously.    The final implants were then cemented into position using third generation  cementing techniques. The final 16-mm E-poly bearing was clipped into  position on a clean and dry tibial blase plate. The knee was then irrigated  and the extensor retinaculum was then closed. The skin was closed in  layers. Sterile dressings were applied, and the patient was awakened and  taken to the recovery room in stable condition. There were no  complications.          E-Signed By  Waynette Buttery, M.D. 08/24/2009 07:40  Waynette Buttery, M.D.    cc: Waynette Buttery, M.D.        PGK/WMX; D: 08/22/2009 9:42 A; T: 08/22/2009 12:25 P; Doc# [id number]; Job#  [id number]

## 2009-08-22 NOTE — Consults (Addendum)
Name: Jillian Bean, Jillian Bean Admitted: 08/22/2009  MR #: [id number] DOB: [birth year]  Account #: [id number] Age: 69  Consultant: Berkeley Veldman P. Robby Sermon, MD Location: [phone]     CONSULTATION REPORT    DATE OF CONSULTATION: 08/22/2009      ATTENDING PHYSICIAN: Dr. Clois Dupes    CONSULTING PHYSICIAN: Dr. Renae Fickle Kiritsis    REASON FOR CONSULT: To manage the patient's medical problems.    CHIEF COMPLAINT: Left knee pain for the past 3 years, status post left  total knee replacement, postoperative day zero.    The patient is a 69 year old, Caucasian female, status post left total knee  replacement, postop day zero. [facility] has been  consulted to manage the patient's medical problems as listed below.    PAST MEDICAL HISTORY: Osteoarthritis, hyperlipidemia, hypothyroidism, neck  spasms.    MEDICATIONS  1. Synthroid 112 mcg q.a.m.  2. Allegra 180 mg q.a.m. p.r.n. seasonal allergies.  3. Multivitamins 1 tablet p.o. daily.  4. Vitamin D3 1000 units q.a.m.  5. Tums EX 2 tabs q.a.m.  6. Coenzyme Q-10 100 mg 1 tab q.a.m.  7. Aspirin 81 mg 1 tab q.a.m.  8. Amrix 15 mg one tablet q.p.m.  9. Pravastatin 20 mg one tablet q.p.m.  10. Colace 100 mg 1 tablet q.p.m.  11. Tylenol 500 mg two tablets q.6 hours p.r.n. headache.    ALLERGIES: YELLOW DYE #5 (HIVES).    PAST SURGICAL HISTORY: Partial hysterectomy in 1973, breast reduction in  1993, left knee arthroscopy for torn meniscus in 2008, and oral surgery.    SOCIAL HISTORY: The patient is married and works as a Building control surveyor at [employer]. Denies smoking, alcohol, or substance  abuse.    FAMILY HISTORY: Mother diagnosed with breast cancer at 33 years-old, which  metastasized to the bones. Mother deceased at 85 years old. Father is  deceased at 25 year old due to MI. Brother with history of multiple  sclerosis.    PREVENTIVE MEDICINE  1. PCP is Dr. Golda Acre.  2. Immunizations are up to date as per patient.   3. Thyroid levels are checked every 6 months, due for a recheck in  February.  4. Lipids are checked every 6 months, also due for a recheck in February.  5. Bone mineral density. Patient does not remember exact date, but states  that previous one was normal.  6. Colonoscopy, August 2010, with normal findings.  7. Mammograms, January 2011, with no abnormal finding.    REVIEW OF SYSTEMS  HEAD: The patient denies headache, no acute visual changes.  EYES: Patient wears contact lenses. Denies blurriness or increased  tearing.  EARS: Denies hearing loss or dizziness.  ENT: Patient complains of sore throat due to intubation from surgery, no  swollen neck.  CARDIOVASCULAR: No history of hypertension, no murmurs, no palpitations,  or chest pain.  RESPIRATORY: Denies shortness of breath, wheezing, or coughing.  GI: Denies abdominal pain, nausea, vomiting. Bowel movements are normal  with hard stools, which the patient takes Colace for.  URINARY: Denies dysuria, frequency, or urgency.  NEUROLOGIC: Denies loss of sensation. No tremors or weakness.    PHYSICAL EXAMINATION  VITAL SIGNS: Temperature 98.4, pulse 77, respirations 16, blood pressure  148/68, O2 saturation 98% on 2 L of oxygen.  GENERAL: The patient is lying in bed comfortably, not in any acute  distress.  EYES: Normal, pink conjunctive.  LUNGS: Clear to auscultation bilaterally.  HEART: Regular rate and rhythm. Normal S1, S2. No murmurs heard.  ABDOMEN: Soft, nontender, nondistended with positive bowel sounds.  EXTREMITIES: No edema or calf tenderness, compression stockings intact  bilaterally.  NEUROLOGIC: Strength 5/5 in upper extremities bilaterally, 5/5 strength in  the right lower extremity. Left lower extremity not assessed due to current  postoperative state. Biceps DTRs 2+ bilaterally.    ASSESSMENTS AND PLANS: The patient is a 69 year old, Caucasian female,  status post left total knee replacement. Today is postoperative day zero.   [facility] will be managing patient's medical problems  including hyperlipidemia, hypothyroidism, neck spasms.  1. Hyperlipidemia. We will continue pravastatin 20 mg q.p.m. We will not  recheck lipid panel since this is followed up with PCP.  2. Hypothyroidism. We will continue Synthroid 112 mcg one tab q.a.m. The  patient will recheck thyroid levels with PCP, due for a recheck in  February.  3. Neck spasms. We will continue Amrix 15 mg q.p.m. for her neck spasms.  4. Gastrointestinal prophylaxis. We will start Protonix 40 mg p.o.    DICTATED BY: Vernia Buff. Rana Snare, MD        Reviewed on 08/22/2009 3:51 PM      E-Signed By  Perfecto Kingdom. Marlisa Caridi, MD 08/31/2009 10:27    Onnika Siebel P. Robby Sermon, MD    cc: Linda Grimmer P. Robby Sermon, MD        GPH/WMX; D: 08/22/2009 2:13 P; T: 08/22/2009 3:19 P; DOC# [id number]; Job#  [id number]

## 2009-08-23 LAB — METABOLIC PANEL, BASIC
Anion gap: 8 mmol/L (ref 5–15)
BUN/Creatinine ratio: 9 — ABNORMAL LOW (ref 12–20)
BUN: 7 MG/DL (ref 6–20)
CO2: 29 MMOL/L (ref 21–32)
Calcium: 8.3 MG/DL — ABNORMAL LOW (ref 8.5–10.1)
Chloride: 99 MMOL/L (ref 97–108)
Creatinine: 0.8 MG/DL (ref 0.6–1.3)
GFR est AA: >60 mL/min/{1.73_m2} (ref >60)
GFR est non-AA: >60 mL/min/{1.73_m2} (ref >60)
Glucose: 134 MG/DL — ABNORMAL HIGH (ref 65–100)
Potassium: 3.5 MMOL/L (ref 3.5–5.1)
Sodium: 136 MMOL/L (ref 136–145)

## 2009-08-23 LAB — CBC W/O DIFF
HCT: 26.9 % — ABNORMAL LOW (ref 35.0–47.0)
HGB: 8.5 g/dL — ABNORMAL LOW (ref 11.5–16.0)
MCH: 26.2 PG (ref 26.0–34.0)
MCHC: 31.6 g/dL (ref 30.0–36.5)
MCV: 82.8 FL (ref 80.0–99.0)
PLATELET: 245 10*3/uL (ref 150–400)
RBC: 3.25 M/uL — ABNORMAL LOW (ref 3.80–5.20)
RDW: 13.4 % (ref 11.5–14.5)
WBC: 5.8 10*3/uL (ref 3.6–11.0)

## 2009-08-23 LAB — PROTHROMBIN TIME + INR
INR: 1.1 (ref 0.9–1.1)
Prothrombin time: 11.1 SECS — ABNORMAL HIGH (ref 9.0–11.0)

## 2009-08-24 LAB — CBC W/O DIFF
HCT: 26.7 % — ABNORMAL LOW (ref 35.0–47.0)
HGB: 8.4 g/dL — ABNORMAL LOW (ref 11.5–16.0)
MCH: 26.3 PG (ref 26.0–34.0)
MCHC: 31.5 g/dL (ref 30.0–36.5)
MCV: 83.4 FL (ref 80.0–99.0)
PLATELET: 299 10*3/uL (ref 150–400)
RBC: 3.2 M/uL — ABNORMAL LOW (ref 3.80–5.20)
RDW: 13.6 % (ref 11.5–14.5)
WBC: 7.7 10*3/uL (ref 3.6–11.0)

## 2009-08-24 LAB — PROTHROMBIN TIME + INR
INR: 1.9 — ABNORMAL HIGH (ref 0.9–1.1)
Prothrombin time: 19.1 SECS — ABNORMAL HIGH (ref 9.0–11.0)

## 2009-08-24 LAB — METABOLIC PANEL, BASIC
Anion gap: 3 mmol/L — ABNORMAL LOW (ref 5–15)
BUN/Creatinine ratio: 10 — ABNORMAL LOW (ref 12–20)
BUN: 8 MG/DL (ref 6–20)
CO2: 32 MMOL/L (ref 21–32)
Calcium: 8.1 MG/DL — ABNORMAL LOW (ref 8.5–10.1)
Chloride: 100 MMOL/L (ref 97–108)
Creatinine: 0.8 MG/DL (ref 0.6–1.3)
GFR est AA: >60 mL/min/{1.73_m2} (ref >60)
GFR est non-AA: >60 mL/min/{1.73_m2} (ref >60)
Glucose: 120 MG/DL — ABNORMAL HIGH (ref 65–100)
Potassium: 3.5 MMOL/L (ref 3.5–5.1)
Sodium: 135 MMOL/L — ABNORMAL LOW (ref 136–145)

## 2009-08-26 NOTE — Procedures (Signed)
Procedures filed by Adah Salvage, MD at 08/31/09 [phone]                Author: Adah Salvage, MD  Service: --  Author Type: Physician       Filed: 08/31/09 0914  Date of Service: 08/26/09 0639  Status: Addendum          Editor: Adah Salvage, MD (Physician)            Procedure Orders        1. US DUPLEX LOWER EXT VEIN Sherre Poot [[id number]] ordered by  at 08/26/09 0639                         <!--EPICS-->                   [place] [facility]<BR>                         [phone]<BR>                             [place], [place] [address]<BR> <BR> <BR> Name:      Jillian Bean, Jillian Bean           Service Date:    08/24/2009<BR> DOB:       [birth year]                Ordered by:<BR> MR #       [id number]                 Location:        [id number]<BR> Sex:       F                          Age:             68<BR> Billing #: [id number]              Date of Adm:     08/22/2009<BR> <BR> <BR>                        LOWER EXTREMITY VENOUS DUPLEX<BR> <BR> <BR> REASON FOR PROCEDURE:  Leg pain.<BR> <BR> FINDINGS:  Pulsed  Doppler spectral analysis shows fully competent deep and<BR> superficial systems. B-mode color imaging shows fully compressible vessels<BR> throughout without thrombosis or obstruction.<BR> <BR> SUMMARY:  Negative lower extremity venous duplex for thrombosis  or<BR> significant reflux.<BR> <BR> E-Signed By<BR> Alvina Filbert, MD 08/31/2009 09:14<BR> Alvina Filbert, MD<BR> <BR> cc:   Waynette Buttery, M.D.<BR>       Alvina Filbert, MD<BR> <BR> <BR> <BR> MW/WMX; D: 08/25/2009  8:36 A; T: 08/26/2009  6:39 A; DOC# [id number];  JOB#<BR> [id number]<BR> <!--EPICE-->

## 2009-08-26 NOTE — Procedures (Addendum)
[place] [facility]   [address]   [place], [place] [address]      Name: Jillian Bean, Jillian Bean Service Date: 08/24/2009  DOB: 19-Jun-1941 Ordered by:  MR # [id number] Location: [id number]  Sex: F Age: 69  Billing #: [id number] Date of Adm: 08/22/2009       LOWER EXTREMITY VENOUS DUPLEX      REASON FOR PROCEDURE: Leg pain.    FINDINGS: Pulsed Doppler spectral analysis shows fully competent deep and  superficial systems. B-mode color imaging shows fully compressible vessels  throughout without thrombosis or obstruction.    SUMMARY: Negative lower extremity venous duplex for thrombosis or  significant reflux.    E-Signed By  Alvina Filbert, MD 08/31/2009 09:14  Alvina Filbert, MD    cc: Waynette Buttery, M.D.   Alvina Filbert, MD        MW/WMX; D: 08/25/2009 8:36 A; T: 08/26/2009 6:39 A; DOC# [id number]; JOB#  [id number]

## 2009-09-08 ENCOUNTER — Encounter

## 2009-09-08 MED ORDER — PRAVASTATIN 20 MG TAB
20 mg | ORAL_TABLET | Freq: Every day | ORAL | Status: DC
Start: 2009-09-08 — End: 2009-12-16

## 2009-09-08 MED ORDER — FEXOFENADINE 180 MG TAB
180 mg | ORAL_TABLET | Freq: Every day | ORAL | Status: AC
Start: 2009-09-08 — End: 2009-12-07

## 2009-09-08 MED ORDER — SYNTHROID 112 MCG TABLET
112 mcg | ORAL_TABLET | Freq: Every day | ORAL | Status: DC
Start: 2009-09-08 — End: 2009-12-16

## 2009-09-08 NOTE — Telephone Encounter (Signed)
Sorry we mail all rx's for mail order to patient's now. We are unable to fax them or call them

## 2009-09-08 NOTE — Telephone Encounter (Signed)
Per patient she needs the following medication to go to mail order please be sure you put the dob, address, and please put the following . Per patient she did not know if this is fax or phone no and it is  [phone] please put down [phone]

## 2009-09-29 NOTE — Progress Notes (Signed)
HISTORY OF PRESENT ILLNESS  Jillian Bean is a 69 y.o. female.  HPI  Seen at follow up.    Issues:  1. She had a left total knee replacement January 10th.  She did well.  Dr. Dorina Hoyer has been happy with her outcome.  She is able to walk downstairs but uses a cane.  She is going back to work this week.  She has no pain at this point.  Only uses Tylenol.  2. She had a cervical spine MRI by Dr. Jettie Pagan.  Has cervical DJD.  Is using muscle relaxers at night which have helped.  Had an incidental finding of her thyroid goiter and will do an ultrasound to follow up.   3. Mild anemia.  Preoperative level was 10.9.  Postop hemoglobin was 9.6.  She is on no iron because it constipates her.  She does have some fatigue.      MedDATA/leh          Review of Systems   Constitutional: Positive for malaise/fatigue. Negative for fever and chills.   Respiratory: Negative for shortness of breath.    Cardiovascular: Negative for chest pain.   Gastrointestinal: Negative for abdominal pain, blood in stool and melena.   Musculoskeletal: Positive for myalgias (around neck spasms). Negative for joint pain.   Neurological: Negative for tremors and sensory change.       Physical Exam   Nursing note and vitals reviewed.  Constitutional: She is oriented to person, place, and time. She appears well-developed and well-nourished.   HENT:   Head: Normocephalic and atraumatic.   Neck: Normal range of motion. Neck supple. Carotid bruit is not present. No thyromegaly present.   Cardiovascular: Normal rate, regular rhythm, S1 normal, S2 normal, normal heart sounds and intact distal pulses.    No murmur heard.  Pulmonary/Chest: Effort normal and breath sounds normal. No respiratory distress. She has no wheezes. She has no rales.   Musculoskeletal: She exhibits no edema.   Neurological: She is alert and oriented to person, place, and time.   Psychiatric: She has a normal mood and affect. Her behavior is normal.       ASSESSMENT and PLAN   Jillian Bean was seen today for follow-up, cholesterol problem and medication refill.    Diagnoses and associated orders for this visit:    Thyroid goiter-incidental on mri-no nodules felt- US THYROID / PARATHYROID; Future    Anemia-mild hgb 10.9 preoop and 9.7 post op-repeat levels  - CBC WITH AUTOMATED DIFF  - FERRITIN    Oa (osteoarthritis)  - METABOLIC PANEL, COMPREHENSIVE    Hypercholesterolemia  - LIPID PANEL

## 2009-09-30 LAB — METABOLIC PANEL, COMPREHENSIVE
A-G Ratio: 1.8 (ref 1.1–2.5)
ALT (SGPT): 11 IU/L (ref 0–40)
AST (SGOT): 15 IU/L (ref 0–40)
Albumin: 4.4 g/dL (ref 3.6–4.8)
Alk. phosphatase: 80 IU/L (ref 25–165)
BUN/Creatinine ratio: 16 (ref 11–26)
BUN: 14 mg/dL (ref 8–27)
Bilirubin, total: 0.3 mg/dL (ref 0.0–1.2)
CO2: 25 mmol/L (ref 20–32)
Calcium: 9.3 mg/dL (ref 8.6–10.2)
Chloride: 103 mmol/L (ref 97–108)
Creatinine: 0.86 mg/dL (ref 0.57–1.00)
GFR est AA: >59 mL/min/{1.73_m2} (ref >59)
GFR est non-AA: >59 mL/min/{1.73_m2} (ref >59)
GLOBULIN, TOTAL: 2.5 g/dL (ref 1.5–4.5)
Glucose: 99 mg/dL (ref 65–99)
Potassium: 4.1 mmol/L (ref 3.5–5.2)
Protein, total: 6.9 g/dL (ref 6.0–8.5)
Sodium: 141 mmol/L (ref 135–145)

## 2009-09-30 LAB — FERRITIN: Ferritin: 188 ng/mL — ABNORMAL HIGH (ref 13–150)

## 2009-09-30 LAB — CBC WITH AUTOMATED DIFF
ABS. BASOPHILS: 0 10*3/uL (ref 0.0–0.2)
ABS. EOSINOPHILS: 0.1 10*3/uL (ref 0.0–0.4)
ABS. IMM. GRANS.: 0 10*3/uL (ref 0.0–0.1)
ABS. LYMPHOCYTES: 1.2 10*3/uL (ref 0.7–4.5)
ABS. MONOCYTES: 0.3 10*3/uL (ref 0.1–1.0)
ABS. NEUTROPHILS: 3.8 10*3/uL (ref 1.8–7.8)
BASOPHILS: 1 % (ref 0–3)
EOSINOPHILS: 2 % (ref 0–7)
HCT: 33.5 % — ABNORMAL LOW (ref 34.0–44.0)
HGB: 10.2 g/dL — ABNORMAL LOW (ref 11.5–15.0)
IMMATURE GRANULOCYTES: 0 % (ref 0–1)
LYMPHOCYTES: 22 % (ref 14–46)
MCH: 25.7 pg — ABNORMAL LOW (ref 27.0–34.0)
MCHC: 30.4 g/dL — ABNORMAL LOW (ref 32.0–36.0)
MCV: 84 fL (ref 80–98)
MONOCYTES: 6 % (ref 4–13)
NEUTROPHILS: 69 % (ref 40–74)
PLATELET: 385 10*3/uL (ref 140–415)
RBC: 3.97 x10E6/uL (ref 3.80–5.10)
RDW: 15.7 % — ABNORMAL HIGH (ref 11.7–15.0)
WBC: 5.5 10*3/uL (ref 4.0–10.5)

## 2009-09-30 LAB — LIPID PANEL
Cholesterol, total: 168 mg/dL (ref 100–199)
HDL Cholesterol: 58 mg/dL (ref >39)
LDL, calculated: 87 mg/dL (ref 0–99)
Triglyceride: 116 mg/dL (ref 0–149)
VLDL, calculated: 23 mg/dL (ref 5–40)

## 2009-10-03 NOTE — Telephone Encounter (Signed)
Discussed anemia-recent surgery-likely still related to this-has trouble with iron causing constipation-advised dietary sources and appt in 31mo to repeat and check b12 levels as well then -other labs are good

## 2009-10-03 NOTE — Progress Notes (Addendum)
Quick Note:    Notified by letter.    ______

## 2009-10-10 NOTE — Progress Notes (Signed)
Quick Note:    Notified of dominant nodule on us-advised endocrine eval and she will make appt and let us know when so we can forward records  ______

## 2009-10-10 NOTE — Progress Notes (Signed)
Quick Note:    Ok - noted  ______

## 2009-10-21 ENCOUNTER — Telehealth

## 2009-10-21 NOTE — Telephone Encounter (Signed)
Patient has appt scheduled with Dr. Marcy Panning - needs referral and records sent to them

## 2009-11-25 NOTE — Telephone Encounter (Addendum)
This is not a referral request so I am also unsure as to what is the next step for this pt's lab results. There is not a telephone encounter indicating this information.

## 2009-11-25 NOTE — Telephone Encounter (Addendum)
Should not need a referral has ppo - faxed records as requested

## 2009-12-16 MED ORDER — PRAVASTATIN 20 MG TAB
20 mg | ORAL_TABLET | Freq: Every day | ORAL | Status: DC
Start: 2009-12-16 — End: 2010-04-13

## 2009-12-16 MED ORDER — SYNTHROID 112 MCG TABLET
112 mcg | ORAL_TABLET | Freq: Every day | ORAL | Status: DC
Start: 2009-12-16 — End: 2010-04-13

## 2009-12-16 NOTE — Progress Notes (Signed)
HISTORY OF PRESENT ILLNESS  Jillian Bean is a 69 y.o. female.  HPI  "Jillian Bean" comes in for follow up.    Issues:    1. Thyroid nodules.  She has worked with Dr. Marcy Panning, had an aspiration which was negative.  Will have a six month follow up ultrasound.  She is due for refill on her Synthroid.  2. Hypercholesterolemia.  Tolerating Pravachol well.  Due for labs.     3. Neck pain with cervical DJD.  Saw Dr. Jettie Pagan.  Has Amrix muscle relaxer she uses about once a week.  Tells me that she would like to have a full Pap smear with her physical in September.  Aware that this is outside of guidelines for screening, but I am happy to do this for her.  She is concerned because she still has intact ovaries.         MedDATA/pag       Review of Systems   Constitutional: Negative for fever, chills and weight loss.   HENT: Positive for neck pain.    Respiratory: Negative for cough, shortness of breath and wheezing.    Cardiovascular: Negative for chest pain, palpitations, orthopnea, leg swelling and PND.   Gastrointestinal: Negative for heartburn and nausea.   Musculoskeletal: Negative for myalgias.   Neurological: Negative for dizziness and headaches.       Physical Exam   Nursing note and vitals reviewed.  Constitutional: She is oriented to person, place, and time. She appears well-developed and well-nourished.   HENT:   Head: Normocephalic and atraumatic.   Neck: Normal range of motion. Neck supple. Carotid bruit is not present. No thyromegaly present.   Cardiovascular: Normal rate, regular rhythm, S1 normal, S2 normal, normal heart sounds and intact distal pulses.    No murmur heard.  Pulmonary/Chest: Effort normal and breath sounds normal. No respiratory distress. She has no wheezes. She has no rales.   Musculoskeletal: She exhibits no edema.   Neurological: She is alert and oriented to person, place, and time.   Psychiatric: She has a normal mood and affect. Her behavior is normal.       ASSESSMENT and PLAN   Alithia was seen today for follow-up, thyroid problem and medication refill.    Diagnoses and associated orders for this visit:    Hypothyroidism  - SYNTHROID 112 mcg tablet; Take 1 Tab by mouth daily for 90 doses.    Hypercholesteremia  - pravastatin (PRAVACHOL) 20 mg tablet; Take 1 Tab by mouth daily for 90 doses.  - LIPID PANEL  - METABOLIC PANEL, COMPREHENSIVE    Oa (osteoarthritis)-cervical djd-cont prn muscle relaxer    Other Orders  - Cyclobenzaprine (AMRIX) 15 mg Cp24 SR capsule; Take 15 mg by mouth daily.      appt for cpe in sept

## 2009-12-17 LAB — METABOLIC PANEL, COMPREHENSIVE
A-G Ratio: 1.8 (ref 1.1–2.5)
ALT (SGPT): 12 IU/L (ref 0–40)
AST (SGOT): 16 IU/L (ref 0–40)
Albumin: 4.3 g/dL (ref 3.5–4.8)
Alk. phosphatase: 82 IU/L (ref 25–165)
BUN/Creatinine ratio: 14 (ref 11–26)
BUN: 12 mg/dL (ref 8–27)
Bilirubin, total: 0.3 mg/dL (ref 0.0–1.2)
CO2: 24 mmol/L (ref 20–32)
Calcium: 9.1 mg/dL (ref 8.6–10.2)
Chloride: 104 mmol/L (ref 97–108)
Creatinine: 0.88 mg/dL (ref 0.57–1.00)
GFR est AA: 77 mL/min/{1.73_m2} (ref >59)
GFR est non-AA: 67 mL/min/{1.73_m2} (ref >59)
GLOBULIN, TOTAL: 2.4 g/dL (ref 1.5–4.5)
Glucose: 89 mg/dL (ref 65–99)
Potassium: 4.2 mmol/L (ref 3.5–5.2)
Protein, total: 6.7 g/dL (ref 6.0–8.5)
Sodium: 141 mmol/L (ref 135–145)

## 2009-12-17 LAB — LIPID PANEL
Cholesterol, total: 175 mg/dL (ref 100–199)
HDL Cholesterol: 59 mg/dL (ref >39)
LDL, calculated: 98 mg/dL (ref 0–99)
Triglyceride: 92 mg/dL (ref 0–149)
VLDL, calculated: 18 mg/dL (ref 5–40)

## 2009-12-20 NOTE — Progress Notes (Addendum)
Quick Note:    Message sent about labs    ______

## 2010-03-27 MED ORDER — CYCLOBENZAPRINE SR 15 MG 24 HR CAP
15 mg | ORAL_CAPSULE | Freq: Every day | ORAL | Status: DC
Start: 2010-03-27 — End: 2011-04-10

## 2010-03-27 NOTE — Progress Notes (Signed)
.Here for what she calls sciatic pain.    Current outpatient prescriptions   Medication Sig   ??? Cyclobenzaprine (AMRIX) 15 mg Cp24 SR capsule Take 15 mg by mouth daily.   ??? pravastatin (PRAVACHOL) 20 mg tablet Take 1 Tab by mouth daily for 90 doses.   ??? SYNTHROID 112 mcg tablet Take 1 Tab by mouth daily for 90 doses.   ??? fexofenadine (ALLEGRA) 180 mg tablet Take 1 Tab by mouth daily for 90 doses.   ??? CALCIUM CARBONATE, ZOX0960, (TUMS PO) Take  by mouth.   ??? coenzyme q10 (CO Q-10) 10 mg Cap Take  by mouth.   ??? cholecalciferol, vitamin d3, (VITAMIN D) 1,000 unit tablet Take  by mouth daily.   ??? ASPIR-81 PO take 81 mg by mouth daily.       Allergies   Allergen Reactions   ??? Yellow Dye Hives   ??? Celebrex (Celecoxib) Other (comments)     Aggitation   ??? Levaquin (Levofloxacin) Palpitations   ??? Zocor (Simvastatin) Other (comments)     Myalgias     ??? Nexium (Esomeprazole Magnesium) Diarrhea       History   Substance Use Topics   ??? Smoking status: Never Smoker    ??? Smokeless tobacco: Never Used   ??? Alcohol Use: No       Past Medical History   Diagnosis Date   ??? Plantar fasciitis 09/01/2007   ??? Colonic polyps 08/31/1998   ??? OA (osteoarthritis) 09/01/2007   ??? Back pain 09/01/2007   ??? Hypercholesteremia 09/01/2007   ??? Hypothyroidism 09/01/2003   ??? S/P colonoscopy 10/10/2007   ??? 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 Hx gyn      Hysterectomy in her 57's   ??? Breast surgery procedure unlisted 1993     Breast Reduction   ??? Endoscopy, colon, diagnostic      7/05 Dr Vivien Rossetti   ??? Hx orthopaedic 08/22/09     left TKR      Nice ldy I have seen in the past here for a new issue.  Here for low back pain.  Last seen in December 2010 for neck pain and we tried PT and Amrix.  Consideration was to be given for her to see Dr. Cathe Mons if things did not get better.  She was doing well until recently.  Had left knee surgery and that went well, done by Dr. Dorina Hoyer..  Now having pain right low back pain that starts in the buttocks and radiates down below the knee.  Had this 2 years ago and did therapy and got better.  Favoring the right leg when walking.  She needs the right one replaced.  Difficult getting comfortable.  When cortisone shot in knee wore off she started having this pain.      Neck better.  She has some stiffness at times, but no bad spasms.  No focal weakness.  She has had no loss of bowel or bladder.      Examination  BP 118/82   Pulse 80   Resp 16   Ht 5\' 7"  (1.702 m)   Wt 154 lb (69.854 kg)   BMI 24.12 kg/m2  Pleasant lady, appropriately dressed and groomed.  Anicteric.  Normal speech, language and cognition.  She has full strength in the extremities.  Symmetric DTR.  She does  favor that right leg when ambulating.  No tenderness to palpation of the lubar region and no point tenderness to the lumbar spine.  She is tender to palpation in the sciatic notch right.      Impression/Plan  Sciatica right exacerbated by her abnormal gait.  She is currently seeing Ortho about getting her right knee fixed.  She would like to rty th eAmrix again to see if that helps the back.  Will try that and if no help, will proceed with imaging. She will let us know how she does and we will decide how to proceed dependent upon how she does.  Rx and samples of Amrix given.  She is familiar with side effects, namelt somnolence from previous and she will take this about dinner time to minimize side effects.    Will await her call to decide upon follow-up.        Examination

## 2010-04-13 MED ORDER — ZOSTER VACCINE LIVE (PF) 19,400 UNIT SUB-Q SOLN
19400 unit/0.65 mL | Freq: Once | SUBCUTANEOUS | Status: AC
Start: 2010-04-13 — End: 2010-04-13

## 2010-04-13 MED ORDER — PRAVASTATIN 20 MG TAB
20 mg | ORAL_TABLET | Freq: Every day | ORAL | Status: DC
Start: 2010-04-13 — End: 2012-11-03

## 2010-04-13 MED ORDER — SYNTHROID 112 MCG TABLET
112 mcg | ORAL_TABLET | Freq: Every day | ORAL | Status: DC
Start: 2010-04-13 — End: 2010-07-04

## 2010-04-13 NOTE — Patient Instructions (Signed)
A Healthy Lifestyle: After Your Visit  Your Care Instructions  A healthy lifestyle can help you feel good, stay at a healthy weight, and have plenty of energy for both work and play. A healthy lifestyle is something you can share with your whole family.  A healthy lifestyle also can lower your risk for serious health problems, such as high blood pressure, heart disease, and diabetes.  You can follow a few steps listed below to improve your health and the health of your family.  Follow-up care is a key part of your treatment and safety. Be sure to make and go to all appointments, and call your doctor if you are having problems. It???s also a good idea to know your test results and keep a list of the medicines you take.  How can you care for yourself at home?  ?? Do not eat too much sugar, fat, or fast foods. You can still have dessert and treats now and then. The goal is moderation.   ?? Start small to improve your eating habits. Pay attention to portion sizes, drink less juice and soda pop, and eat more fruits and vegetables.   ?? Eat a healthy amount of food. A 3-ounce serving of meat, for example, is about the size of a deck of cards. Fill the rest of your plate with vegetables and whole grains.   ?? Limit the amount of soda and sports drinks you have every day. Drink more water when you are thirsty.   ?? Eat at least 5 servings of fruits and vegetables every day. It may seem like a lot, but it is not hard to reach this goal. A serving or helping is 1 piece of fruit, 1 cup of vegetables, or 2 cups of leafy, raw vegetables. Have an apple or some carrot sticks as an afternoon snack instead of a candy bar. Try to have fruits and/or vegetables at every meal.   ?? Make exercise part of your daily routine. You may want to start with simple activities, such as walking, bicycling, or slow swimming. Try to be active 30 to 60 minutes every day. You do not need to do all 30 to 60 minutes all at once. For example, you can exercise 3 times a day for 10 or 20 minutes. Moderate exercise is safe for most people, but it is always a good idea to talk to your doctor before starting an exercise program.   ?? Keep moving. Mow the lawn, work in the garden, or clean your house. Take the stairs instead of the elevator at work.   ?? If you smoke, quit. People who smoke have an increased risk for heart attack, stroke, cancer, and other lung illnesses. Quitting is hard, but there are ways to boost your chance of quitting tobacco for good.   ?? Use nicotine gum, patches, or lozenges.   ?? Ask your doctor about stop-smoking programs and medicines.   ?? Keep trying.  In addition to reducing your risk of diseases in the future, you will notice some benefits soon after you stop using tobacco. If you have shortness of breath or asthma symptoms, they will likely get better within a few weeks after you quit.   ?? Limit how much alcohol you drink. Moderate amounts of alcohol (up to 2 drinks a day for men, 1 drink a day for women) are okay. But drinking too much can lead to liver problems, high blood pressure, and other health problems.  Family health  If   you have a family, there are many things you can do together to improve your health.  ?? Eat meals together as a family as often as possible.   ?? Eat healthy foods. This includes fruits, vegetables, lean meats and dairy, and whole grains.   ?? Include your family in your fitness plan. Most people think of activities such as jogging or tennis as the way to fitness, but there are many ways you and your family can be more active. Anything that makes you breathe hard and gets your heart pumping is exercise. Here are some tips:    ?? Walk to do errands or to take your child to school or the bus.   ?? Go for a family bike ride after dinner instead of watching TV.    Where can you learn more?   Go to http://www.healthwise.net/[facility]  Enter U807 in the search box to learn more about "A Healthy Lifestyle: After Your Visit."   ?? 2006-2011 Healthwise[employer] Inc[employer]. Care instructions adapted under license by [facility] (which disclaims liability or warranty for this information). This care instruction is for use with your licensed healthcare professional. If you have questions about a medical condition or this instruction, always ask your healthcare professional. Healthwise, Incorporated disclaims any warranty or liability for your use of this information.  Content Version: 9.0.56994; Last Revised: May 26, 2009

## 2010-04-13 NOTE — Progress Notes (Signed)
Subjective:   69 y.o. female for annual routine Pap and checkup.      Social History: single partner, contraception - post menopausal status.  Pertinent past medical hstory: partial hysterectomy, colonoscopy 8/10, mammogram 1/11, td 2007, pneumovax 2007. dexa 5/09.    Patient Active Problem List   Diagnoses Date Noted   ??? DJD (degenerative joint disease), cervical [722.4H] 12/16/2009   ??? DJD (Degenerative Joint Disease) of Knee [715.96L] 04/07/2008   ??? S/P Colonoscopy [V45.89NH] 10/10/2007   ??? Hypercholesterolemia [272.0J] 09/01/2007   ??? OA (Osteoarthritis) [715.90BA] 09/01/2007   ??? Back Pain [724.5E] 09/01/2007   ??? Plantar Fasciitis [728.48F] 09/01/2007   ??? Hypothyroidism [244.9AP] 09/01/2003   ??? Colonic Polyps [211.3N] 08/31/1998       Current outpatient prescriptions   Medication Sig Dispense Refill   ??? varicella zoster vacine live (VARICELLA-ZOSTER VIRUS INFECTION PROPHYLAXIS) 19,400 unit SolR 1 Dose by SubCUTAneous route once for 1 dose.  1 Dose  0   ??? SYNTHROID 112 mcg tablet Take 1 Tab by mouth daily for 90 doses.  90 Tab  3   ??? pravastatin (PRAVACHOL) 20 mg tablet Take 1 Tab by mouth daily for 90 doses.  90 Tab  3   ??? Cyclobenzaprine (AMRIX) 15 mg Cp24 SR capsule Take 1 Cap by mouth daily. Take at dinner time  30 Cap  3   ??? CALCIUM CARBONATE, CAL1163, (TUMS PO) Take  by mouth.       ??? coenzyme q10 (CO Q-10) 10 mg Cap Take  by mouth.       ??? cholecalciferol, vitamin d3, (VITAMIN D) 1,000 unit tablet Take  by mouth daily.       ??? ASPIR-81 PO take 81 mg by mouth daily.       ??? DISCONTD: pravastatin (PRAVACHOL) 20 mg tablet Take 1 Tab by mouth daily for 90 doses.  90 Tab  1   ??? DISCONTD: SYNTHROID 112 mcg tablet Take 1 Tab by mouth daily for 90 doses.  90 Tab  1   ??? fexofenadine (ALLEGRA) 180 mg tablet Take 1 Tab by mouth daily for 90 doses.  90 Tab  0       Allergies   Allergen Reactions   ??? Yellow Dye Hives   ??? Celebrex (Celecoxib) Other (comments)     Aggitation   ??? Levaquin (Levofloxacin) Palpitations    ??? Zocor (Simvastatin) Other (comments)     Myalgias     ??? Nexium (Esomeprazole Magnesium) Diarrhea       Past Medical History   Diagnosis Date   ??? Plantar fasciitis 09/01/2007   ??? Colonic polyps 08/31/1998   ??? OA (osteoarthritis) 09/01/2007   ??? Back pain 09/01/2007   ??? Hypercholesteremia 09/01/2007   ??? Hypothyroidism 09/01/2003   ??? S/P colonoscopy 10/10/2007   ??? 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 Hx gyn      Hysterectomy in her 76's   ??? Breast surgery procedure unlisted 1993     Breast Reduction   ??? Endoscopy, colon, diagnostic      7/05 Dr Vivien Rossetti   ??? Hx orthopaedic 08/22/09     left TKR   ??? Hx heent 04/07/10     thyroid biopsy neg dr Suszanne Finch       Family History   Problem Relation Age of Onset   ??? Cancer  Mother      Breast   ??? Heart Failure Father      MI   ??? Seizures Brother      ms       History   Substance Use Topics   ??? Smoking status: Never Smoker    ??? Smokeless tobacco: Never Used   ??? Alcohol Use: No          ROS:  Feeling well. No dyspnea or chest pain on exertion.  No abdominal pain, change in bowel habits, black or bloody stools.  No urinary tract symptoms. GYN ROS: no breast pain or new or enlarging lumps on self exam, no vaginal bleeding, no discharge or pelvic pain. No neurological complaints.    Objective:   BP 105/64   Pulse 90   Resp 16   Ht 5\' 7"  (1.702 m)   Wt 149 lb 3.2 oz (67.677 kg)   BMI 23.37 kg/m2   LMP 04/13/2010  The patient appears well, alert, oriented x 3, in no distress.  ENT normal.  Neck supple. No adenopathy or thyromegaly. PERLA. Lungs are clear, good air entry, no wheezes, rhonchi or rales. S1 and S2 normal, no murmurs, regular rate and rhythm. Abdomen soft without tenderness, guarding, mass or organomegaly. Extremities show no edema, normal peripheral pulses. Neurological is normal, no focal findings.     BREAST EXAM: breasts appear normal, no suspicious masses, no skin or nipple changes or axillary nodes    PELVIC EXAM: VULVA: normal appearing vulva with no masses, tenderness or lesions, VAGINA: normal appearing vagina with normal color and discharge, no lesions, CERVIX: surgically absent, PAP: Pap smear done today, thin-prep method    Assessment/Plan:   well woman  mammogram  pap smear  counseled on breast self exam, mammography screening and adequate intake of calcium and vitamin D  return annually or prn  .  HISTORY OF PRESENT ILLNESS  Jillian Bean is a 69 y.o. female.  HPI  Seen for well woman visit and to follow up on several medical issues.  She has had another thyroid nodule biopsied by Dr. Marcy Panning which was fortunately benign. She feels well.  She has not had a TSH checked recently. She remains on Pravachol for lipids and is due for levels. She is using Amrix muscle relaxer from Dr. Jettie Pagan for help with muscle spasms in neck and back and this has been useful.     Social History: Still works at [employer].  Thinking of retiring. She is a Psychiatrist role. She does not smoke. No alcohol.     MedDATA/ruc      Review of Systems   Constitutional: Negative for fever, chills and weight loss.   HENT: Positive for neck pain.    Respiratory: Negative for cough, shortness of breath and wheezing.    Cardiovascular: Negative for chest pain, palpitations, orthopnea, leg swelling and PND.   Gastrointestinal: Negative for heartburn, nausea and constipation.   Musculoskeletal: Positive for myalgias.   Neurological: Negative for dizziness and headaches.   All other systems reviewed and are negative.        Physical Exam   Nursing note and vitals reviewed.  Constitutional: She is oriented to person, place, and time. She appears well-developed and well-nourished.   HENT:   Head: Normocephalic and atraumatic.   Right Ear: External ear normal.   Left Ear: External ear normal.   Nose: Nose normal.    Mouth/Throat: Oropharynx is clear and moist.   Eyes: Conjunctivae and  EOM are normal. Pupils are equal, round, and reactive to light.   Neck: Normal range of motion. Neck supple. Carotid bruit is not present. Thyromegaly (mild right greater than left lobe) present.   Cardiovascular: Normal rate, regular rhythm, S1 normal, S2 normal, normal heart sounds and intact distal pulses.    Pulmonary/Chest: Effort normal and breath sounds normal.   Abdominal: Soft. Normal appearance and bowel sounds are normal. There is no hepatosplenomegaly. No tenderness.   Musculoskeletal: Normal range of motion. She exhibits no edema.   Lymphadenopathy:     She has no cervical adenopathy.   Neurological: She is alert and oriented to person, place, and time.   Skin: Skin is warm, dry and intact.   Psychiatric: She has a normal mood and affect. Her behavior is normal. Judgment and thought content normal.       ASSESSMENT and PLAN  Jillian Bean was seen today for well woman and medication refill.    Diagnoses and associated orders for this visit:    Well woman exam with routine gynecological exam  - varicella zoster vacine live (VARICELLA-ZOSTER VIRUS INFECTION PROPHYLAXIS) 19,400 unit SolR; 1 Dose by SubCUTAneous route once for 1 dose.  - CBC WITH AUTOMATED DIFF  - PAP, LIQUID BASED, MANUAL SCREEN    Hypercholesteremia  - LIPID PANEL  - METABOLIC PANEL, COMPREHENSIVE  - pravastatin (PRAVACHOL) 20 mg tablet; Take 1 Tab by mouth daily for 90 doses.    Hypothyroidism  - TSH, 3RD GENERATION  - SYNTHROID 112 mcg tablet; Take 1 Tab by mouth daily for 90 doses.    Oa (osteoarthritis)            lab results and schedule of future lab studies reviewed with patient  reviewed diet, exercise and weight control  reviewed medications and side effects in detail

## 2010-04-14 ENCOUNTER — Telehealth

## 2010-04-14 LAB — CBC WITH AUTOMATED DIFF
ABS. BASOPHILS: 0 10*3/uL (ref 0.0–0.2)
ABS. EOSINOPHILS: 0.1 10*3/uL (ref 0.0–0.4)
ABS. IMM. GRANS.: 0 10*3/uL (ref 0.0–0.1)
ABS. LYMPHOCYTES: 1.1 10*3/uL (ref 0.7–4.5)
ABS. MONOCYTES: 0.4 10*3/uL (ref 0.1–1.0)
ABS. NEUTROPHILS: 3.8 10*3/uL (ref 1.8–7.8)
BASOPHILS: 1 % (ref 0–3)
EOSINOPHILS: 2 % (ref 0–7)
HCT: 33.7 % — ABNORMAL LOW (ref 34.0–44.0)
HGB: 10.8 g/dL — ABNORMAL LOW (ref 11.5–15.0)
IMMATURE GRANULOCYTES: 0 % (ref 0–2)
LYMPHOCYTES: 20 % (ref 14–46)
MCH: 26 pg — ABNORMAL LOW (ref 27.0–34.0)
MCHC: 32 g/dL (ref 32.0–36.0)
MCV: 81 fL (ref 80–98)
MONOCYTES: 7 % (ref 4–13)
NEUTROPHILS: 70 % (ref 40–74)
PLATELET: 382 10*3/uL (ref 140–415)
RBC: 4.15 x10E6/uL (ref 3.80–5.10)
RDW: 14.4 % (ref 11.7–15.0)
WBC: 5.4 10*3/uL (ref 4.0–10.5)

## 2010-04-14 LAB — METABOLIC PANEL, COMPREHENSIVE
A-G Ratio: 1.6 (ref 1.1–2.5)
ALT (SGPT): 10 IU/L (ref 0–40)
AST (SGOT): 17 IU/L (ref 0–40)
Albumin: 4.2 g/dL (ref 3.5–4.8)
Alk. phosphatase: 88 IU/L (ref 25–165)
BUN/Creatinine ratio: 15 (ref 11–26)
BUN: 13 mg/dL (ref 8–27)
Bilirubin, total: 0.3 mg/dL (ref 0.0–1.2)
CO2: 27 mmol/L (ref 20–32)
Calcium: 9.1 mg/dL (ref 8.6–10.2)
Chloride: 102 mmol/L (ref 97–108)
Creatinine: 0.89 mg/dL (ref 0.57–1.00)
GFR est AA: 76 mL/min/{1.73_m2} (ref >59)
GFR est non-AA: 66 mL/min/{1.73_m2} (ref >59)
GLOBULIN, TOTAL: 2.6 g/dL (ref 1.5–4.5)
Glucose: 93 mg/dL (ref 65–99)
Potassium: 3.9 mmol/L (ref 3.5–5.2)
Protein, total: 6.8 g/dL (ref 6.0–8.5)
Sodium: 141 mmol/L (ref 135–145)

## 2010-04-14 LAB — LIPID PANEL
Cholesterol, total: 172 mg/dL (ref 100–199)
HDL Cholesterol: 58 mg/dL (ref >39)
LDL, calculated: 93 mg/dL (ref 0–99)
Triglyceride: 103 mg/dL (ref 0–149)
VLDL, calculated: 21 mg/dL (ref 5–40)

## 2010-04-14 LAB — TSH 3RD GENERATION: TSH: 6.18 u[IU]/mL — ABNORMAL HIGH (ref 0.450–4.500)

## 2010-04-14 NOTE — Progress Notes (Addendum)
Quick Note:    Discussed repeat tsh and if still high then inc synthroid to 125 mcg dose  ______

## 2010-04-14 NOTE — Telephone Encounter (Signed)
Discussed she is on synthroid 112 2 on Sunday adn tsh 6-will repeat levels and if still high will inc dose to 125 mcg

## 2010-04-21 NOTE — Progress Notes (Signed)
Quick Note:    Notified by letter.    ______

## 2010-04-23 LAB — T4F: T4,Free (Direct): 1.37 ng/dL (ref 0.82–1.77)

## 2010-04-23 LAB — TSH RFX ON ABNORMAL TO FREE T4: TSH: 6.4 u[IU]/mL — ABNORMAL HIGH (ref 0.450–4.500)

## 2010-04-24 ENCOUNTER — Telehealth

## 2010-04-24 NOTE — Telephone Encounter (Addendum)
Quick Note:    Inc dose and appt in 5mo  ______

## 2010-04-24 NOTE — Telephone Encounter (Signed)
Inc to synthroid 112 every day but 2 on Tuesday and sun day as she just refilled her rx -see me in 96mo and labs then

## 2010-04-25 LAB — AMB POC FECAL BLOOD, OCCULT, QL 3 CARDS: Hemoccult (POC): NEGATIVE

## 2010-04-25 NOTE — Progress Notes (Addendum)
Addended by: Shane Crutch T on: 04/25/2010      Modules accepted: Orders

## 2010-07-04 MED ORDER — SYNTHROID 112 MCG TABLET
112 mcg | ORAL_TABLET | Freq: Every day | ORAL | Status: DC
Start: 2010-07-04 — End: 2010-08-25

## 2010-07-04 MED ORDER — DICLOFENAC 1 % TOPICAL GEL
1 % | Freq: Four times a day (QID) | CUTANEOUS | Status: DC
Start: 2010-07-04 — End: 2011-05-04

## 2010-07-04 NOTE — Progress Notes (Signed)
HISTORY OF PRESENT ILLNESS  Jillian Bean is a 69 y.o. female.  HPI  Patient is seen for followup of hypothyroidism.   Thyroid ROS: denies , weight changes, heat/cold intolerance, bowel/skin changes or CVS symptoms.notes ongoing fatigue despite inc in synthroid dose to 112 every day but 2 on 2 days a week.  Notes pain in both first mcp joints and trouble with opening jars.    Review of Systems   Constitutional: Positive for malaise/fatigue. Negative for fever, chills and weight loss.   Gastrointestinal: Negative for heartburn and nausea.   Musculoskeletal: Positive for joint pain. Negative for myalgias.   Neurological: Negative for dizziness and headaches.       Physical Exam   Nursing note and vitals reviewed.  Constitutional: She is oriented to person, place, and time. She appears well-developed and well-nourished.   Musculoskeletal:        Swelling in first mcp joints and tender over joints, not red or warm   Neurological: She is alert and oriented to person, place, and time.   Psychiatric: She has a normal mood and affect. Her behavior is normal. Judgment and thought content normal.       ASSESSMENT and PLAN  [name] was seen today for follow-up.    Diagnoses and associated orders for this visit:    Hypothyroidism-likely will need to inc dose further  - SYNTHROID 112 mcg tablet; Take 1 Tab by mouth daily for 90 doses.  - TSH, 3RD GENERATION    Djd (degenerative joint disease)  - REFERRAL TO ORTHOPEDICS  - Diclofenac Sodium (VOLTAREN) 1 % topical gel; Apply 2 g to affected area four (4) times daily.

## 2010-07-05 LAB — TSH 3RD GENERATION: TSH: 2.06 u[IU]/mL (ref 0.450–4.500)

## 2010-07-05 NOTE — Progress Notes (Addendum)
Quick Note:    Message sent about labs  e    ______

## 2010-07-19 ENCOUNTER — Telehealth

## 2010-07-19 NOTE — Telephone Encounter (Signed)
Plz call pt ver Work# RE: lab results

## 2010-07-19 NOTE — Telephone Encounter (Signed)
Plz call pt ver work# calling about a med question on how to take her meds. Call ver work#

## 2010-07-19 NOTE — Telephone Encounter (Signed)
Reviewed tsh cont dose

## 2010-07-19 NOTE — Telephone Encounter (Signed)
Patient just spoke to Dr. Darl Pikes - she is ok now -

## 2010-07-28 NOTE — Telephone Encounter (Signed)
Mrs Rosemeyer would like to speak with nurse regarding her mail order pharmacy on her synthroid. They will not fill it because they told her Dr Darl Pikes had called in 30 days already. She try to tell them she did not get the whole 30 day supply. She is getting ready to leave for christmas and she is not going have enough to last her. She will be back to work on Monday at 8 am her no will be [phone]

## 2010-07-31 NOTE — Telephone Encounter (Signed)
Patient tried sending 32 tab rx to mail order - they won't fill it - advised I will call it into [facility] - when she returns from vac she will call me and we will send a new rx to mail order - agrees to plan

## 2010-08-25 MED ORDER — DESONIDE 0.05 % LOTION
0.05 % | Freq: Two times a day (BID) | CUTANEOUS | Status: DC
Start: 2010-08-25 — End: 2011-04-10

## 2010-08-25 MED ORDER — SYNTHROID 112 MCG TABLET
112 mcg | ORAL_TABLET | Freq: Every day | ORAL | Status: DC
Start: 2010-08-25 — End: 2011-05-04

## 2010-08-25 NOTE — Progress Notes (Signed)
HISTORY OF PRESENT ILLNESS  Jillian Bean is a 70 y.o. female.  HPI  Patient is seen for followup of hypothyroidism.   Thyroid ROS: denies fatigue, weight changes, heat/cold intolerance, bowel/skin changes or CVS symptoms. Now on synthroid 112 mcg 9 pills per week and tsh 2 in nov.  Needs steroid cream for prn use for ear dermatitis.    Review of Systems   Constitutional: Negative for fever, chills, weight loss and malaise/fatigue.   Gastrointestinal: Negative for constipation.   Skin: Positive for itching. Negative for rash.       Physical Exam   Nursing note and vitals reviewed.  Constitutional: She is oriented to person, place, and time. She appears well-developed and well-nourished.   HENT:   Head: Normocephalic and atraumatic.   Neck: Normal range of motion. Neck supple. Carotid bruit is not present. No thyromegaly present.   Cardiovascular: Normal rate, regular rhythm, S1 normal, S2 normal, normal heart sounds and intact distal pulses.    No murmur heard.  Pulmonary/Chest: Effort normal and breath sounds normal. No respiratory distress. She has no wheezes. She has no rales.   Musculoskeletal: She exhibits no edema.   Neurological: She is alert and oriented to person, place, and time.   Psychiatric: She has a normal mood and affect. Her behavior is normal.       ASSESSMENT and PLAN  Jillian Bean was seen today for follow-up and thyroid problem.    Diagnoses and associated orders for this visit:    Hypothyroidism  - SYNTHROID 112 mcg tablet; Take 1 Tab by mouth daily for 90 doses. Extra pill 2 days a week for a total of 9 per week    Dermatitis  - desonide (TRIDESILON) 0.05 % topical lotion; Apply  to affected area two (2) times a day.

## 2010-09-18 MED ORDER — LORAZEPAM 1 MG TAB
1 mg | ORAL_TABLET | ORAL | Status: DC
Start: 2010-09-18 — End: 2010-10-04

## 2010-09-18 NOTE — Progress Notes (Signed)
Date:  September 18, 2010    Name:  Jillian Bean  DOB:  10/27/1940  MRN:  [id number]     PCP:  Clarene Critchley, MD    Chief Complaint   Patient presents with   ??? Follow-up     HISTORY OF PRESENT ILLNESS: Jillian Bean presents today for follow up evaluation of pain that runs down the back of both legs, the right is worse than the left. This is described as a tight feeling that is painful. It is not a sharp shooting pain. It is more like a deep aching pain. When she first wakes up in the morning it is very tight. Once she gets going it does loosen up, but will tighten back up if she sits for any length of time. It does not seem to improve much until after she takes Tylenol. It feels like a muscular issue and feels chronic. She has tried to do her physical therapy exercises for sciatica which do not seem to have helped. She has some lower back pain which started about the first of the year. She indicates that she was getting out of the car and it seemed like her lower back just caught. It has not changed since it has started. She started having pain in her legs after this.      Current Outpatient Prescriptions   Medication Sig   ??? desonide (TRIDESILON) 0.05 % topical lotion Apply  to affected area two (2) times a day.   ??? SYNTHROID 112 mcg tablet Take 1 Tab by mouth daily for 90 doses. Extra pill 2 days a week for a total of 9 per week   ??? Diclofenac Sodium (VOLTAREN) 1 % topical gel Apply 2 g to affected area four (4) times daily.   ??? Cyclobenzaprine (AMRIX) 15 mg Cp24 SR capsule Take 1 Cap by mouth daily. Take at dinner time   ??? CALCIUM CARBONATE, [id number], (TUMS PO) Take  by mouth.   ??? coenzyme q10 (CO Q-10) 10 mg Cap Take  by mouth.   ??? cholecalciferol, vitamin d3, (VITAMIN D) 1,000 unit tablet Take  by mouth daily.   ??? ASPIR-81 PO take 81 mg by mouth daily.   ??? pravastatin (PRAVACHOL) 20 mg tablet Take 1 Tab by mouth daily for 90 doses.    ??? fexofenadine (ALLEGRA) 180 mg tablet Take 1 Tab by mouth daily for 90 doses.     Allergies   Allergen Reactions   ??? Celebrex (Celecoxib) Other (comments)     Aggitation   ??? Levaquin (Levofloxacin) Palpitations   ??? Nexium (Esomeprazole Magnesium) Diarrhea   ??? Yellow Dye Hives   ??? Zocor (Simvastatin) Other (comments)     Myalgias       Past Medical History   Diagnosis Date   ??? Plantar fasciitis 09/01/2007   ??? Colonic polyps 08/31/1998   ??? OA (osteoarthritis) 09/01/2007   ??? Back pain 09/01/2007   ??? Hypercholesteremia 09/01/2007   ??? Hypothyroidism 09/01/2003   ??? S/P colonoscopy 10/10/2007   ??? 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 DJD (degenerative joint disease), cervical 12/16/2009     Past Surgical History   Procedure Date   ??? Hx gyn      Hysterectomy in her 33's   ??? Breast surgery procedure unlisted 1993     Breast Reduction   ??? Endoscopy, colon, diagnostic      7/05 Dr Vivien Rossetti   ??? Hx orthopaedic 08/22/09  left TKR   ??? Hx heent 04/07/10     thyroid biopsy neg dr Suszanne Finch     History     Social History   ??? Marital Status: Married     Spouse Name: N/A     Number of Children: N/A   ??? Years of Education: N/A     Occupational History   ??? Not on file.     Social History Main Topics   ??? Smoking status: Never Smoker    ??? Smokeless tobacco: Never Used   ??? Alcohol Use: No   ??? Drug Use: No   ??? Sexually Active: Not Currently     Other Topics Concern   ??? Not on file     Social History Narrative   ??? No narrative on file     Family History   Problem Relation Age of Onset   ??? Cancer Mother      Breast   ??? Heart Failure Father      MI   ??? Seizures Brother      ms       PHYSICAL EXAMINATION:    Visit Vitals   Item Reading   ??? BP 128/78   ??? Pulse 62   ??? Resp 12   ??? Ht 5\' 7"  (1.702 m)   ??? Wt 155 lb (70.308 kg)   ??? BMI 24.28 kg/m2   ??? SpO2 99%     General:  Well defined, nourished, and groomed individual in no acute distress.     Neck: Supple, nontender, thyroid within normal limits, no JVD, no bruits, no pain with resistance to active range of motion.    Heart: Regular rate and rhythm, no murmurs, rub, or gallop.  Normal S1S2.  Lungs:  Clear to auscultation bilaterally with equal chest expansion, no cough, no wheeze  Musculoskeletal:  Extremities revealed no edema and had full range of motion of joints.    Psych:  Good mood and bright affect    NEUROLOGICAL EXAMINATION:     Mental Status:   Alert and oriented to person, place, and time with recent and remote memory intact.  Attention span and concentration are normal. Speech is fluent with a full fund of knowledge.      Cranial Nerves:    II, III, IV, VI:  Visual acuity grossly intact. Visual fields are normal.    Pupils are equal, round, and reactive to light and accommodation.    Extra-ocular movements are full and fluid.  Fundoscopic exam was benign, no ptosis or nystagmus.   V-XII: Hearing is grossly intact.  Facial features are symmetric, with normal sensation and strength.  The palate rises symmetrically and the tongue protrudes midline.  Sternocleidomastoids 5/5.      Motor Examination: Normal tone, bulk, and strength, 5/5 muscle strength throughout.        Sensory exam:  Normal throughout to pinprick, temperature, and vibration sense.  .      Coordination:  Finger to nose and rapid arm movement testing was normal.   No resting or intention tremor    Gait and Station:  Steady while walking on toes, heels, and with tandem walking.  Normal arm swing.  No Rhomberg or pronator drift.   No muscle wasting or fasiculations noted.      Reflexes:  DTRs are asymmetrical in the lower extremities with the left being greater than the right. Toes downgoing.      ASSESSMENT AND PLAN  1. Low back pain radiating to  both legs  MRI SPINE LUMBAR WITHOUT CONTRAST, LORazepam (ATIVAN) 1 mg tablet   2. Back pain  MRI SPINE LUMBAR WITHOUT CONTRAST, LORazepam (ATIVAN) 1 mg tablet    3. Abnormal reflexes  MRI SPINE LUMBAR WITHOUT CONTRAST, LORazepam (ATIVAN) 1 mg tablet     During today's interview, Jillian Bean indicates that she developed acute back pain at the beginning of the year with worsening pain radiating down her legs over time. With this history and abnormal reflexes, she will be sent for an MRI of her lumbar spine. We did discuss the potential for additional metabolic studies pending this study since she describes the discomfort as an aching or muscular pain. She was in agreement with this plan.    Cydni Reddoch A. Chestine Spore, ACNP

## 2010-09-18 NOTE — Progress Notes (Signed)
Pt presents for leg pain.  She takes Amrix "but not every day".  She believes it to be caused by her Sciatica.

## 2010-10-04 NOTE — Progress Notes (Signed)
Patient presents today to speak with doctor about most recent MRI results.

## 2010-10-05 NOTE — Telephone Encounter (Addendum)
Called Dr. Edwin Dada office was given a new fax number [phone]. Faxed office notes manually. Called and Lm for patient that it had been done.

## 2010-10-05 NOTE — Progress Notes (Signed)
Date:  October 04, 2010    Name:  Jillian Bean  DOB:  07/08/1941  MRN:  [id number]     PCP:  Clarene Critchley, MD    Chief Complaint   Patient presents with   ??? Results     MRI     HISTORY OF PRESENT ILLNESS: Ms. Abundis presents today for further evaluation and treatment following MRI. The MRI does demonstrate mild stenosis and degenerative changes but nothing that appears surgical. She continues to have pain in her back with pain radiating into her leg.    Current Outpatient Prescriptions   Medication Sig   ??? pravastatin (PRAVACHOL) 20 mg tablet Take 20 mg by mouth daily.     ??? fexofenadine (ALLEGRA) 180 mg tablet Take  by mouth daily.     ??? acetaminophen (TYLENOL) 325 mg tablet Take  by mouth every six (6) hours as needed.     ??? desonide (TRIDESILON) 0.05 % topical lotion Apply  to affected area two (2) times a day.   ??? SYNTHROID 112 mcg tablet Take 1 Tab by mouth daily for 90 doses. Extra pill 2 days a week for a total of 9 per week   ??? Diclofenac Sodium (VOLTAREN) 1 % topical gel Apply 2 g to affected area four (4) times daily.   ??? Cyclobenzaprine (AMRIX) 15 mg Cp24 SR capsule Take 1 Cap by mouth daily. Take at dinner time   ??? CALCIUM CARBONATE, [id number], (TUMS PO) Take  by mouth daily.   ??? coenzyme q10 (CO Q-10) 10 mg Cap Take  by mouth.   ??? cholecalciferol, vitamin d3, (VITAMIN D) 1,000 unit tablet Take  by mouth daily.   ??? ASPIR-81 PO take 81 mg by mouth daily.   ??? pravastatin (PRAVACHOL) 20 mg tablet Take 1 Tab by mouth daily for 90 doses.     Allergies   Allergen Reactions   ??? Celebrex (Celecoxib) Other (comments)     Aggitation   ??? Levaquin (Levofloxacin) Palpitations   ??? Nexium (Esomeprazole Magnesium) Diarrhea   ??? Yellow Dye Hives   ??? Zocor (Simvastatin) Other (comments)     Myalgias       Past Medical History   Diagnosis Date   ??? Plantar fasciitis 09/01/2007   ??? Colonic polyps 08/31/1998   ??? OA (osteoarthritis) 09/01/2007   ??? Back pain 09/01/2007   ??? Hypercholesteremia 09/01/2007    ??? Hypothyroidism 09/01/2003   ??? S/P colonoscopy 10/10/2007   ??? 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 DJD (degenerative joint disease), cervical 12/16/2009     Past Surgical History   Procedure Date   ??? Hx gyn      Hysterectomy in her 89's   ??? Breast surgery procedure unlisted 1993     Breast Reduction   ??? Endoscopy, colon, diagnostic      7/05 Dr Vivien Rossetti   ??? Hx orthopaedic 08/22/09     left TKR   ??? Hx heent 04/07/10     thyroid biopsy neg dr Suszanne Finch     History     Social History   ??? Marital Status: Married     Spouse Name: N/A     Number of Children: N/A   ??? Years of Education: N/A     Occupational History   ??? Not on file.     Social History Main Topics   ??? Smoking status: Never Smoker    ??? Smokeless tobacco: Never Used   ??? Alcohol  Use: No   ??? Drug Use: No   ??? Sexually Active: Not Currently     Other Topics Concern   ??? Not on file     Social History Narrative   ??? No narrative on file     Family History   Problem Relation Age of Onset   ??? Cancer Mother      Breast   ??? Heart Failure Father      MI   ??? Seizures Brother      ms       PHYSICAL EXAMINATION:    Visit Vitals   Item Reading   ??? BP 120/82   ??? Pulse 94   ??? Temp(Src) 98.4 ??F (36.9 ??C) (Oral)   ??? Ht 5\' 7"  (1.702 m)   ??? Wt 156 lb (70.761 kg)   ??? BMI 24.43 kg/m2   ??? SpO2 98%   General: Well defined, nourished, and groomed individual in no acute distress.   Neck: Supple, nontender, thyroid within normal limits, no JVD, no bruits, no pain with resistance to active range of motion.   Heart: Regular rate and rhythm, no murmurs, rub, or gallop. Normal S1S2.   Lungs: Clear to auscultation bilaterally with equal chest expansion, no cough, no wheeze   Musculoskeletal: Extremities revealed no edema and had full range of motion of joints.   Psych: Good mood and bright affect   NEUROLOGICAL EXAMINATION:    Mental Status: Alert and oriented to person, place, and time with recent and remote memory intact. Attention span and concentration are normal. Speech is fluent with a full fund of knowledge.   Cranial Nerves:   II, III, IV, VI: Visual acuity grossly intact. Visual fields are normal.   Pupils are equal, round, and reactive to light and accommodation.   Extra-ocular movements are full and fluid. Fundoscopic exam was benign, no ptosis or nystagmus.   V-XII: Hearing is grossly intact. Facial features are symmetric, with normal sensation and strength. The palate rises symmetrically and the tongue protrudes midline. Sternocleidomastoids 5/5.   Motor Examination: Normal tone, bulk, and strength, 5/5 muscle strength throughout.   Sensory exam: Normal throughout to pinprick, temperature, and vibration sense. .   Coordination: Finger to nose and rapid arm movement testing was normal. No resting or intention tremor   Gait and Station: Steady while walking. Normal arm swing. No pronator drift. No muscle wasting or fasiculations noted.   Reflexes: DTRs are asymmetrical in the lower extremities with the left being greater than the right.       ASSESSMENT AND PLAN  1. Lumbar stenosis  REFERRAL TO PAIN MANAGEMENT   2. Back pain  REFERRAL TO PAIN MANAGEMENT     Ms. Chuang had an MRI of her lumbar spine completed which demonstrated some mild lumbar stenosis and degenerative changes. These issues do not appear to be surgical. We did discuss her results and she was offered physical therapy for conservative therapy. However, she indicated that she has had physical therapy in the past for this which made her knees hurt more. As such, she was referred to Dr. Jolaine Click or associate for possible ESI. Follow up as needed.    Deacon Gadbois A. Chestine Spore, ACNP

## 2010-10-05 NOTE — Telephone Encounter (Signed)
She is calling because [place] referred her to Dr. Jacqulyn Bath. His office is stating that they have not received her referral from Ms. Clark yet. She is checking on this.

## 2011-04-10 NOTE — Progress Notes (Signed)
Date:  April 10, 2011     Name:  Jillian Bean  DOB:  August 08, 1941  MRN:  [id number]     PCP:  Clarene Critchley    Chief Complaint   Patient presents with   ??? Back Pain     follow up;      Current Outpatient Prescriptions   Medication Sig   ??? pravastatin (PRAVACHOL) 20 mg tablet Take 20 mg by mouth daily.     ??? fexofenadine (ALLEGRA) 180 mg tablet Take  by mouth daily.     ??? Diclofenac Sodium (VOLTAREN) 1 % topical gel Apply 2 g to affected area four (4) times daily.   ??? CALCIUM CARBONATE, [id number], (TUMS PO) Take  by mouth daily.   ??? coenzyme q10 (CO Q-10) 10 mg Cap Take  by mouth.   ??? cholecalciferol, vitamin d3, (VITAMIN D) 1,000 unit tablet Take  by mouth daily.   ??? ASPIR-81 PO take 81 mg by mouth daily.   ??? SYNTHROID 112 mcg tablet Take 1 Tab by mouth daily for 90 doses. Extra pill 2 days a week for a total of 9 per week   ??? pravastatin (PRAVACHOL) 20 mg tablet Take 1 Tab by mouth daily for 90 doses.     Allergies   Allergen Reactions   ??? Celebrex (Celecoxib) Other (comments)     Aggitation   ??? Levaquin (Levofloxacin) Palpitations   ??? Nexium (Esomeprazole Magnesium) Diarrhea   ??? Yellow Dye Hives   ??? Zocor (Simvastatin) Other (comments)     Myalgias       Past Medical History   Diagnosis Date   ??? Plantar fasciitis 09/01/2007   ??? Colonic polyps 08/31/1998   ??? OA (osteoarthritis) 09/01/2007   ??? Back pain 09/01/2007   ??? Hypercholesteremia 09/01/2007   ??? Hypothyroidism 09/01/2003   ??? S/P colonoscopy 10/10/2007   ??? 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 DJD (degenerative joint disease), cervical 12/16/2009     Past Surgical History   Procedure Date   ??? Hx gyn      Hysterectomy in her 87's   ??? Pr breast surgery procedure unlisted 1993     Breast Reduction   ??? Endoscopy, colon, diagnostic      7/05 Dr Vivien Rossetti   ??? Hx orthopaedic 08/22/09     left TKR   ??? Hx heent 04/07/10     thyroid biopsy neg dr Suszanne Finch     History     Social History   ??? Marital Status: Married     Spouse Name: N/A      Number of Children: N/A   ??? Years of Education: N/A     Occupational History   ??? Not on file.     Social History Main Topics   ??? Smoking status: Never Smoker    ??? Smokeless tobacco: Never Used   ??? Alcohol Use: No   ??? Drug Use: No   ??? Sexually Active: Not Currently     Other Topics Concern   ??? Not on file     Social History Narrative   ??? No narrative on file     Family History   Problem Relation Age of Onset   ??? Cancer Mother      Breast   ??? Heart Failure Father      MI   ??? Seizures Brother      ms       PHYSICAL EXAMINATION:    Visit  Vitals   Item Reading   ??? BP 128/81   ??? Pulse 76   ??? Ht 5\' 7"  (1.702 m)   ??? Wt 160 lb 3.2 oz (72.666 kg)   ??? BMI 25.09 kg/m2   ??? SpO2 98%         ASSESSMENT AND PLAN  1. Chronic low back pain    2. DJD (degenerative joint disease) of knee       Jillian Bean was seen today for consultation of a possible procedure suggested by the spine specialist she has been seeing. She indicated that she has had several ESI which were not beneficial. She had a facet joint injection which helped for 4-5 days and the pain in her back that radiates down her right leg gradually came back. Due to the response to the facet joint injections, it was suggested that she try a medial branch nerve block using a radiofrequency neurotomy. We discussed this procedure and how it may provide some relief from the pain for a longer period of time. Some of her concern was that the facet joint injections were successful for such a short period time that she was concerned that this procedure would not work either. As explained to her with the best of my knowledge regarding this procedure, it sounds as though there is a good chance that this will be beneficial due to the response of the facet joint injections. If this provides her between 9 and 14 months of relief from this pain, then she may be able to concentrate more fully on having her knee pain treated. About 40 minutes was spent with Jillian Bean discussing this procedure and all of her questions were answered to the best of my ability. I would defer more detailed explanation to her pain management provider.     Havyn Ramo A. Chestine Spore, ACNP

## 2011-04-10 NOTE — Progress Notes (Signed)
Patient presented today in follow up for back pain. She has been going to [facility] for injections and would like to speak with someone about the procedure that they are recommending.

## 2011-05-01 MED ORDER — DULOXETINE 30 MG CAP, DELAYED RELEASE
30 mg | ORAL_CAPSULE | Freq: Every day | ORAL | Status: DC
Start: 2011-05-01 — End: 2011-05-04

## 2011-05-01 MED ORDER — DULOXETINE 30 MG CAP, DELAYED RELEASE
30 mg | ORAL_CAPSULE | Freq: Every day | ORAL | Status: DC
Start: 2011-05-01 — End: 2011-07-02

## 2011-05-01 NOTE — Progress Notes (Signed)
Chief Complaint   Patient presents with   ??? Back Pain     Follow up     Current Outpatient Prescriptions   Medication Sig   ??? pravastatin (PRAVACHOL) 20 mg tablet Take 20 mg by mouth daily.     ??? fexofenadine (ALLEGRA) 180 mg tablet Take  by mouth daily.     ??? Diclofenac Sodium (VOLTAREN) 1 % topical gel Apply 2 g to affected area four (4) times daily.   ??? CALCIUM CARBONATE, [id number], (TUMS PO) Take  by mouth daily.   ??? coenzyme q10 (CO Q-10) 10 mg Cap Take  by mouth.   ??? cholecalciferol, vitamin d3, (VITAMIN D) 1,000 unit tablet Take  by mouth daily.   ??? ASPIR-81 PO take 81 mg by mouth daily.   ??? SYNTHROID 112 mcg tablet Take 1 Tab by mouth daily for 90 doses. Extra pill 2 days a week for a total of 9 per week   ??? pravastatin (PRAVACHOL) 20 mg tablet Take 1 Tab by mouth daily for 90 doses.     Allergies   Allergen Reactions   ??? Celebrex (Celecoxib) Other (comments)     Aggitation   ??? Levaquin (Levofloxacin) Palpitations   ??? Nexium (Esomeprazole Magnesium) Diarrhea   ??? Yellow Dye Hives   ??? Zocor (Simvastatin) Other (comments)     Myalgias       History   Substance Use Topics   ??? Smoking status: Never Smoker    ??? Smokeless tobacco: Never Used   ??? Alcohol Use: No     Here for back pain.  Saw [facility] about a month ago  Seeing pain mgt  Getting injections  Reviewed notes from 9/11 visit when they did ESI and she felt fine for 2 days  She is complaining of knee soreness   She had multiple questions about medial branch blocks and RF ablation  We discussed stimulators as well  Taking Tylenol for pain and afraid about her liver  We discussed Cymbalta and its indications and potential side effects  Will start 30mg  Qd for now and see how that does over the next 8 weeks and make adjustments if needed at that point      All 30 minutes spent discussing above

## 2011-05-01 NOTE — Progress Notes (Signed)
Follow up back/ leg pain. Pain is 6/10 today after medication. Has been receiving injections at [facility].

## 2011-05-04 MED ORDER — SYNTHROID 112 MCG TABLET
112 mcg | ORAL_TABLET | Freq: Every day | ORAL | Status: DC
Start: 2011-05-04 — End: 2011-10-29

## 2011-05-04 MED ORDER — PRAVASTATIN 20 MG TAB
20 mg | ORAL_TABLET | Freq: Every day | ORAL | Status: DC
Start: 2011-05-04 — End: 2012-03-27

## 2011-05-04 MED ORDER — DICLOFENAC 1 % TOPICAL GEL
1 % | Freq: Four times a day (QID) | CUTANEOUS | Status: DC
Start: 2011-05-04 — End: 2012-05-06

## 2011-05-04 MED ORDER — DESONIDE 0.05 % LOTION
0.05 % | Freq: Two times a day (BID) | CUTANEOUS | Status: DC
Start: 2011-05-04 — End: 2012-05-06

## 2011-05-04 NOTE — Progress Notes (Signed)
HISTORY OF PRESENT ILLNESS  Jillian Bean is a 70 y.o. female.  HPI  Patient is seen for followup of hypothyroidism.   Thyroid ROS: denies fatigue, weight changes, heat/cold intolerance, bowel/skin changes or CVS symptoms.  Has back pain and has tried epidural and facet joint injections with dr Lasandra Beech and pain returns in several days-she is considering a block procedure but has just started cymbalta 30 mg every day to try to help with chronic pain-so far not much difference. Is using tylenol prn.very active-this summer went to [facility].  Tolerates her pravachol and no myalgias. Uses topical voltaren on hands with good results.  Review of Systems   Constitutional: Negative for fever, chills and weight loss.   Respiratory: Negative for cough, shortness of breath and wheezing.    Cardiovascular: Negative for chest pain, palpitations, orthopnea, leg swelling and PND.   Gastrointestinal: Negative for heartburn and nausea.   Musculoskeletal: Positive for back pain and joint pain. Negative for myalgias.   Neurological: Negative for dizziness and headaches.       Physical Exam   Nursing note and vitals reviewed.  Constitutional: She is oriented to person, place, and time. She appears well-developed and well-nourished.   HENT:   Head: Normocephalic and atraumatic.   Neck: Normal range of motion. Neck supple. Carotid bruit is not present. No thyromegaly present.   Cardiovascular: Normal rate, regular rhythm, S1 normal, S2 normal, normal heart sounds and intact distal pulses.    No murmur heard.  Pulmonary/Chest: Effort normal and breath sounds normal. No respiratory distress. She has no wheezes. She has no rales.   Musculoskeletal: She exhibits no edema.        Healed left tk scar but good rom of knees and hips   Neurological: She is alert and oriented to person, place, and time.   Psychiatric: She has a normal mood and affect. Her behavior is normal.       ASSESSMENT and PLAN   Jillian Bean was seen today for well woman and medication refill.    Diagnoses and associated orders for this visit:      Oa (osteoarthritis)  Chronic back pain sp injections and considering a nerve block -cont the cymbalta and discussed could titrate dose up and cont tylenol prn  Hypercholesterolemia  - METABOLIC PANEL, COMPREHENSIVE  - LIPID PANEL  - TSH, 3RD GENERATION    Hypothyroidism  - DEXA BONE DENSITY STUDY AXIAL; Future  - SYNTHROID 112 mcg tablet; Take 1 Tab by mouth daily for 90 doses. Extra pill 2 days a week for a total of 9 per week  - VITAMIN D, 25-HYDROXY, TOTAL  - CBC WITH AUTOMATED DIFF    Djd (degenerative joint disease)  - diclofenac (VOLTAREN) 1 % topical gel; Apply 2 g to affected area four (4) times daily.    Dermatitis  - desonide (TRIDESILON) 0.05 % topical lotion; Apply  to affected area two (2) times a day.    Other Orders  - pravastatin (PRAVACHOL) 20 mg tablet; Take 1 Tab by mouth daily.

## 2011-05-04 NOTE — Progress Notes (Signed)
Subjective:   69 y.o. female for annual routine Pap and checkup.  Patient's last menstrual period was 04/13/2010.    Social History: single partner, contraception - status post hysterectomy.  Pertinent past medical hstory: colonoscopy in 8/10 5 year repeat advised,mammogram 1/12,td and pneumovax 2007, last dexa in 2009.    Patient Active Problem List   Diagnoses Date Noted   ??? 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 S/P colonoscopy 10/10/2007   ??? Hypercholesterolemia 09/01/2007   ??? OA (Osteoarthritis) 09/01/2007   ??? Back Pain 09/01/2007   ??? Plantar Fasciitis 09/01/2007   ??? Hypothyroidism 09/01/2003   ??? Colonic Polyps 08/31/1998     Current Outpatient Prescriptions   Medication Sig Dispense Refill   ??? SYNTHROID 112 mcg tablet Take 1 Tab by mouth daily for 90 doses. Extra pill 2 days a week for a total of 9 per week  114 Tab  3   ??? diclofenac (VOLTAREN) 1 % topical gel Apply 2 g to affected area four (4) times daily.  100 g  2   ??? pravastatin (PRAVACHOL) 20 mg tablet Take 1 Tab by mouth daily.  90 Tab  3   ??? desonide (TRIDESILON) 0.05 % topical lotion Apply  to affected area two (2) times a day.  59 mL  2   ??? DULoxetine (CYMBALTA) 30 mg capsule Take 1 Cap by mouth daily.  30 Cap  3   ??? fexofenadine (ALLEGRA) 180 mg tablet Take  by mouth daily.         ??? CALCIUM CARBONATE, [id number], (TUMS PO) Take  by mouth daily.       ??? coenzyme q10 (CO Q-10) 10 mg Cap Take  by mouth.       ??? cholecalciferol, vitamin d3, (VITAMIN D) 1,000 unit tablet Take  by mouth daily.       ??? ASPIR-81 PO take 81 mg by mouth daily.       ??? DISCONTD: pravastatin (PRAVACHOL) 20 mg tablet Take 20 mg by mouth daily.         ??? DISCONTD: SYNTHROID 112 mcg tablet Take 1 Tab by mouth daily for 90 doses. Extra pill 2 days a week for a total of 9 per week  114 Tab  3   ??? pravastatin (PRAVACHOL) 20 mg tablet Take 1 Tab by mouth daily for 90 doses.  90 Tab  3     Allergies    Allergen Reactions   ??? Celebrex (Celecoxib) Other (comments)     Aggitation   ??? Levaquin (Levofloxacin) Palpitations   ??? Nexium (Esomeprazole Magnesium) Diarrhea   ??? Yellow Dye Hives   ??? Zocor (Simvastatin) Other (comments)     Myalgias       Past Medical History   Diagnosis Date   ??? Plantar fasciitis 09/01/2007   ??? Colonic polyps 08/31/1998   ??? OA (osteoarthritis) 09/01/2007   ??? Back pain 09/01/2007   ??? Hypercholesteremia 09/01/2007   ??? Hypothyroidism 09/01/2003   ??? S/P colonoscopy 10/10/2007   ??? 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 DJD (degenerative joint disease), cervical 12/16/2009     Past Surgical History   Procedure Date   ??? Hx gyn      Hysterectomy in her 43's   ??? Pr breast surgery procedure unlisted 1993     Breast Reduction   ??? Hx orthopaedic 08/22/09     left TKR   ???  Hx heent 04/07/10     thyroid biopsy neg dr Suszanne Finch   ??? Endoscopy, colon, diagnostic 7/12/18/08     7/05 Dr Vivien Rossetti     Family History   Problem Relation Age of Onset   ??? Cancer Mother      Breast   ??? Heart Failure Father      MI   ??? Seizures Brother      ms     History   Substance Use Topics   ??? Smoking status: Never Smoker    ??? Smokeless tobacco: Never Used   ??? Alcohol Use: No        ROS:  Feeling well. No dyspnea or chest pain on exertion.  No abdominal pain, change in bowel habits, black or bloody stools.  No urinary tract symptoms. GYN ROS: no breast pain or new or enlarging lumps on self exam, no vaginal bleeding, no discharge or pelvic pain. No neurological complaints.    Objective:   BP 122/50   Pulse 81   Temp(Src) 98.5 ??F (36.9 ??C) (Oral)   Resp 18   Ht 5\' 7"  (1.702 m)   Wt 157 lb (71.215 kg)   BMI 24.59 kg/m2   LMP 04/13/2010  The patient appears well, alert, oriented x 3, in no distress.   ENT normal.  Neck supple. No adenopathy or thyromegaly. PERLA. Lungs are clear, good air entry, no wheezes, rhonchi or rales. S1 and S2 normal, no murmurs, regular rate and rhythm. Abdomen soft without tenderness, guarding, mass or organomegaly. Extremities show no edema, normal peripheral pulses. Neurological is normal, no focal findings.    BREAST EXAM: breasts appear normal, no suspicious masses, no skin or nipple changes or axillary nodes    PELVIC EXAM: VULVA: normal appearing vulva with no masses, tenderness or lesions, VAGINA: normal appearing vagina with normal color and discharge, no lesions, CERVIX: surgically absent, UTERUS: surgically absent, vaginal cuff well healed, ADNEXA: surgically absent bilateral, PAP: Pap smear done today, thin-prep method    Assessment/Plan:   well woman  mammogram  pap smear  counseled on breast self exam and adequate intake of calcium and vitamin D  return annually or prn  Necola was seen today for well woman and medication refill.    Diagnoses and associated orders for this visit:    Well woman exam  - URINALYSIS W/ RFLX MICROSCOPIC  - AMB POC EKG ROUTINE W/ 12 LEADS, INTER & REP  - PAP, LIQUID BASED, MANUAL SCREEN  dexa ordered, flu shot in october  Oa (osteoarthritis)    Hypercholesterolemia  - METABOLIC PANEL, COMPREHENSIVE  - LIPID PANEL  - TSH, 3RD GENERATION    Hypothyroidism  - DEXA BONE DENSITY STUDY AXIAL; Future  - SYNTHROID 112 mcg tablet; Take 1 Tab by mouth daily for 90 doses. Extra pill 2 days a week for a total of 9 per week  - VITAMIN D, 25-HYDROXY, TOTAL  - CBC WITH AUTOMATED DIFF    Djd (degenerative joint disease)  - diclofenac (VOLTAREN) 1 % topical gel; Apply 2 g to affected area four (4) times daily.    Dermatitis  - desonide (TRIDESILON) 0.05 % topical lotion; Apply  to affected area two (2) times a day.    Other Orders  - pravastatin (PRAVACHOL) 20 mg tablet; Take 1 Tab by mouth daily.      Marland Kitchen

## 2011-05-05 LAB — METABOLIC PANEL, COMPREHENSIVE
A-G Ratio: 1.4 (ref 1.1–2.5)
ALT (SGPT): 12 IU/L (ref 0–40)
AST (SGOT): 14 IU/L (ref 0–40)
Albumin: 4.1 g/dL (ref 3.5–4.8)
Alk. phosphatase: 90 IU/L (ref 25–165)
BUN/Creatinine ratio: 19 (ref 11–26)
BUN: 14 mg/dL (ref 8–27)
Bilirubin, total: 0.3 mg/dL (ref 0.0–1.2)
CO2: 25 mmol/L (ref 20–32)
Calcium: 9.1 mg/dL (ref 8.6–10.2)
Chloride: 100 mmol/L (ref 97–108)
Creatinine: 0.74 mg/dL (ref 0.57–1.00)
GFR est non-AA: 82 mL/min/{1.73_m2} (ref >59)
GLOBULIN, TOTAL: 2.9 g/dL (ref 1.5–4.5)
Glucose: 97 mg/dL (ref 65–99)
Potassium: 4 mmol/L (ref 3.5–5.2)
Protein, total: 7 g/dL (ref 6.0–8.5)
Sodium: 137 mmol/L (ref 134–144)
eGFR If African American: 94 mL/min/{1.73_m2} (ref >59)

## 2011-05-05 LAB — LIPID PANEL
Cholesterol, total: 181 mg/dL (ref 100–199)
HDL Cholesterol: 63 mg/dL (ref >39)
LDL, calculated: 97 mg/dL (ref 0–99)
Triglyceride: 104 mg/dL (ref 0–149)
VLDL, calculated: 21 mg/dL (ref 5–40)

## 2011-05-05 LAB — CBC WITH AUTOMATED DIFF
ABS. BASOPHILS: 0 10*3/uL (ref 0.0–0.2)
ABS. EOSINOPHILS: 0.1 10*3/uL (ref 0.0–0.4)
ABS. IMM. GRANS.: 0 10*3/uL (ref 0.0–0.1)
ABS. MONOCYTES: 0.5 10*3/uL (ref 0.1–1.0)
ABS. NEUTROPHILS: 6.1 10*3/uL (ref 1.8–7.8)
Abs Lymphocytes: 1.1 10*3/uL (ref 0.7–4.5)
BASOPHILS: 0 % (ref 0–3)
EOSINOPHILS: 2 % (ref 0–7)
HCT: 32.7 % — ABNORMAL LOW (ref 34.0–46.6)
HGB: 9.7 g/dL — ABNORMAL LOW (ref 11.1–15.9)
IMMATURE GRANULOCYTES: 0 % (ref 0–2)
Lymphocytes: 14 % (ref 14–46)
MCH: 24.1 pg — ABNORMAL LOW (ref 26.6–33.0)
MCHC: 29.7 g/dL — ABNORMAL LOW (ref 31.5–35.7)
MCV: 81 fL (ref 79–97)
MONOCYTES: 6 % (ref 4–13)
NEUTROPHILS: 78 % — ABNORMAL HIGH (ref 40–74)
PLATELET: 445 10*3/uL — ABNORMAL HIGH (ref 140–415)
RBC: 4.03 x10E6/uL (ref 3.77–5.28)
RDW: 15.8 % — ABNORMAL HIGH (ref 12.3–15.4)
WBC: 7.9 10*3/uL (ref 4.0–10.5)

## 2011-05-05 LAB — TSH 3RD GENERATION: TSH: 2.32 u[IU]/mL (ref 0.450–4.500)

## 2011-05-07 LAB — URINALYSIS W/ RFLX MICROSCOPIC

## 2011-05-08 NOTE — Telephone Encounter (Signed)
Verdell Face at [facility] calling to advise correct ICD9 diagnosis code needed for pt's pap specimen in order to be processed. Please call Raynelle Fanning: [phone] W.

## 2011-05-08 NOTE — Telephone Encounter (Signed)
V72.31 dx code - called

## 2011-05-09 NOTE — Progress Notes (Signed)
Quick Note:    Notified by letter.    ______

## 2011-05-10 LAB — VITAMIN D, 25-HYDROXY, TOTAL: Vitamin D 25-Hydroxy: 50 ng/mL

## 2011-05-15 ENCOUNTER — Telehealth

## 2011-05-15 NOTE — Telephone Encounter (Signed)
Patient call in states she is returning Dr Verlee Monte call from Monday 05/14/11.  ver # [phone]

## 2011-05-15 NOTE — Telephone Encounter (Signed)
Notified of anemia-this is stable from 2011 but not clear why -no obvious blood loss-she will make appt with dr Vivien Rossetti for gi eval and we will send her orders for labs to check b12 and iron levels

## 2011-05-17 NOTE — Progress Notes (Signed)
Quick Note:    Notified of anemia and eval needed  ______

## 2011-06-01 NOTE — Telephone Encounter (Signed)
Pt would like a call with her dexa results [phone]

## 2011-06-01 NOTE — Telephone Encounter (Signed)
Pt advised of letter that was sent for results and verbal given from letter.

## 2011-06-01 NOTE — Telephone Encounter (Signed)
Message left for patient regarding Cymbalta. Per her last visit on 05/11/11, she is to stay on the current dose for 8 weeks.

## 2011-06-01 NOTE — Progress Notes (Signed)
Quick Note:    Notified by letter.  Vit d and calcium and appt  ______

## 2011-06-02 LAB — IRON PROFILE
Iron % saturation: 6 % — CL (ref 15–55)
Iron: 13 ug/dL — ABNORMAL LOW (ref 35–155)
TIBC: 207 ug/dL — ABNORMAL LOW (ref 250–450)
UIBC: 194 ug/dL (ref 150–375)

## 2011-06-02 LAB — VITAMIN B12 & FOLATE
Folate: 17.2 ng/mL (ref >3.0)
Vitamin B12: 1109 pg/mL — ABNORMAL HIGH (ref 211–946)

## 2011-06-02 LAB — FERRITIN: Ferritin: 169 ng/mL — ABNORMAL HIGH (ref 13–150)

## 2011-06-03 NOTE — Telephone Encounter (Signed)
Quick Note:    i have asked her to see dr sobieski-can you call her and check on when she has made that appt and send him lab results? thanks  ______

## 2011-06-07 NOTE — Telephone Encounter (Signed)
Quick Note:    Has appointment on nov 16th at 1pm - records faxed  ______

## 2011-07-02 MED ORDER — DULOXETINE 60 MG CAP, DELAYED RELEASE
60 mg | ORAL_CAPSULE | Freq: Every day | ORAL | Status: DC
Start: 2011-07-02 — End: 2011-09-27

## 2011-07-02 NOTE — Progress Notes (Signed)
Follow up for back and leg pain. Started Cymbalta last visit with minimal improvement. Is seeing pain specialist.

## 2011-07-02 NOTE — Progress Notes (Signed)
[facility]            Chief Complaint   Patient presents with   ??? Back Pain     Follow up     Current Outpatient Prescriptions   Medication Sig Dispense Refill   ??? SYNTHROID 112 mcg tablet Take 1 Tab by mouth daily for 90 doses. Extra pill 2 days a week for a total of 9 per week  114 Tab  3   ??? diclofenac (VOLTAREN) 1 % topical gel Apply 2 g to affected area four (4) times daily.  100 g  2   ??? pravastatin (PRAVACHOL) 20 mg tablet Take 1 Tab by mouth daily.  90 Tab  3   ??? desonide (TRIDESILON) 0.05 % topical lotion Apply  to affected area two (2) times a day.  59 mL  2   ??? DULoxetine (CYMBALTA) 30 mg capsule Take 1 Cap by mouth daily.  30 Cap  3   ??? fexofenadine (ALLEGRA) 180 mg tablet Take  by mouth daily.         ??? pravastatin (PRAVACHOL) 20 mg tablet Take 1 Tab by mouth daily for 90 doses.  90 Tab  3   ??? CALCIUM CARBONATE, [id number], (TUMS PO) Take  by mouth daily.       ??? coenzyme q10 (CO Q-10) 10 mg Cap Take  by mouth.       ??? cholecalciferol, vitamin d3, (VITAMIN D) 1,000 unit tablet Take  by mouth daily.       ??? ASPIR-81 PO take 81 mg by mouth daily.         Allergies   Allergen Reactions   ??? Celebrex (Celecoxib) Other (comments)     Aggitation   ??? Levaquin (Levofloxacin) Palpitations   ??? Nexium (Esomeprazole Magnesium) Diarrhea   ??? Yellow Dye Hives   ??? Zocor (Simvastatin) Other (comments)     Myalgias       History   Substance Use Topics   ??? Smoking status: Never Smoker    ??? Smokeless tobacco: Never Used   ??? Alcohol Use: No      Here for follow up for back pain.  She is also seeing pain management.  We started her on low dose Cymbalta about 8 weeks ago and plan was to see how that did and decide about increasing to 60mg  daily.  Since last visit, she notes there has been no change in her sxs.  She tolerated that well.  Still seeing pain mgt and she notes she just had 2 injections in the SI joint and she notes she still has to take the pain meds.  She has several questions regarding what her next steps would be and she wanted to know about medial branch blocks and she failed the trial.  She wonders how much of her pain is her hip.  She sees Dr. Dorina Hoyer about the hip and he did not think it was her hip.      ROS  Pain as stated.  Difficult to sleep.      Examination  BP 120/78   Pulse 81   Ht 5\' 7"  (1.702 m)   Wt 155 lb (70.308 kg)   BMI 24.28 kg/m2   SpO2 97%   LMP 04/13/2010  Awake, alert and oriented.  Pleasant.  No icterus    Imp/Plan  Chronic low back pain  Cont with Dr. Lasandra Beech for pain mgt  Try increasing Cymbalta to 60mg  daily and discussed  potential for GI side effects with increase  Follow in 8 weeks

## 2011-07-02 NOTE — Telephone Encounter (Signed)
Pt would like a name and no of orthopedic for her Right hip. Work Medical laboratory scientific officer

## 2011-07-02 NOTE — Telephone Encounter (Signed)
[facility] to call for appointment.

## 2011-09-27 MED ORDER — DULOXETINE 60 MG CAP, DELAYED RELEASE
60 mg | ORAL_CAPSULE | Freq: Every day | ORAL | Status: DC
Start: 2011-09-27 — End: 2012-03-27

## 2011-09-27 NOTE — Progress Notes (Signed)
[facility] [place], [facility]  [address]  [place], [place] [address]  [phone] Fax              Chief Complaint   Patient presents with   ??? Back Pain     Follow up     Current Outpatient Prescriptions   Medication Sig Dispense Refill   ??? PV W-O CAL/FERROUS FUMARATE/FA (M-VIT PO) Take  by mouth.         ??? DULoxetine (CYMBALTA) 60 mg capsule Take 1 Cap by mouth daily.  90 Cap  3   ??? diclofenac (VOLTAREN) 1 % topical gel Apply 2 g to affected area four (4) times daily.  100 g  2   ??? pravastatin (PRAVACHOL) 20 mg tablet Take 1 Tab by mouth daily.  90 Tab  3   ??? fexofenadine (ALLEGRA) 180 mg tablet Take  by mouth daily.         ??? CALCIUM CARBONATE, [id number], (TUMS PO) Take  by mouth daily.       ??? coenzyme q10 (CO Q-10) 10 mg Cap Take  by mouth.       ??? cholecalciferol, vitamin d3, (VITAMIN D) 1,000 unit tablet Take  by mouth daily.       ??? ASPIR-81 PO take 81 mg by mouth daily.       ??? SYNTHROID 112 mcg tablet Take 1 Tab by mouth daily for 90 doses. Extra pill 2 days a week for a total of 9 per week  114 Tab  3   ??? desonide (TRIDESILON) 0.05 % topical lotion Apply  to affected area two (2) times a day.  59 mL  2   ??? pravastatin (PRAVACHOL) 20 mg tablet Take 1 Tab by mouth daily for 90 doses.  90 Tab  3     Allergies   Allergen Reactions   ??? Celebrex (Celecoxib) Other (comments)     Aggitation   ??? Levaquin (Levofloxacin) Palpitations   ??? Nexium (Esomeprazole Magnesium) Diarrhea   ??? Yellow Dye Hives   ??? Zocor (Simvastatin) Other (comments)     Myalgias       History   Substance Use Topics   ??? Smoking status: Never Smoker    ??? Smokeless tobacco: Never Used   ??? Alcohol Use: No     Here for follow up back pain.  She has started to see Dr. Doylene Bode and notes she is doing much better. She notes she has stopped with Dr Lasandra Beech and he told her he had tried everything he could do.  She notes he goes in at a different angle with the injection and she had relief with the  1st injection and after the 2nd 75% reduction in her pain.  Still has some radic pain right but tolerable. Needs knee replacement.    Imp/Plan  Back pain chronic  Better with interventions as above  Keep Cymbalta to help with residual discomfort  Follow in about 6 months

## 2011-09-27 NOTE — Progress Notes (Signed)
Follow up for back pain. Has switched to new pain management doctor, Dr. Reed Breech and has good results.

## 2011-10-19 ENCOUNTER — Telehealth

## 2011-10-19 NOTE — Progress Notes (Signed)
HISTORY OF PRESENT ILLNESS  Jillian Bean is a 71 y.o. female.  HPI  Follow-up.     Issues:    1.  Back pain.  Now working with Dr. Doylene Bode and has now had three injections, most recently yesterday and is feeling better, also on Cymbalta for pain.    2. Hyperthyroidism.  On Synthroid 112 now due for labs.    3. Anemia.  Negative GI and bowel.  Last hemoglobin up to 10.6 with Dr. Vivien Rossetti, still on iron three times a day and will follow-up labs with him.   4. Dyslipidemia on Pravachol 20 mg, no myalgias.      MedDATA/dlr                Review of Systems   Constitutional: Negative for fever, chills and weight loss.   Respiratory: Negative for cough, shortness of breath and wheezing.    Cardiovascular: Negative for chest pain, palpitations, orthopnea, leg swelling and PND.   Gastrointestinal: Negative for heartburn, nausea, abdominal pain, diarrhea and blood in stool.   Musculoskeletal: Positive for back pain. Negative for myalgias.   Neurological: Negative for dizziness and headaches.       Physical Exam   Nursing note and vitals reviewed.  Constitutional: She is oriented to person, place, and time. She appears well-developed and well-nourished.   HENT:   Head: Normocephalic and atraumatic.   Neck: Normal range of motion. Neck supple. Carotid bruit is not present. No thyromegaly present.   Cardiovascular: Normal rate, regular rhythm, S1 normal, S2 normal, normal heart sounds and intact distal pulses.    No murmur heard.  Pulmonary/Chest: Effort normal and breath sounds normal. No respiratory distress. She has no wheezes. She has no rales.   Musculoskeletal: She exhibits no edema.   Neurological: She is alert and oriented to person, place, and time.   Psychiatric: She has a normal mood and affect. Her behavior is normal.       ASSESSMENT and PLAN  Jillian Bean was seen today for follow-up.    Diagnoses and associated orders for this visit:    Back pain    Anemia-neg gi eval-hgb 10.6 last week cont iron     Hypercholesterolemia  - METABOLIC PANEL, COMPREHENSIVE  - LIPID PANEL    Hypothyroidism  - TSH, 3RD GENERATION

## 2011-10-19 NOTE — Progress Notes (Signed)
"  REVIEWED RECORD IN PREPARATION FOR VISIT AND HAVE OBTAINED NECESSARY DOCUMENTATION"

## 2011-10-19 NOTE — Telephone Encounter (Signed)
Message copied by Joycelyn Schmid on Fri Oct 19, 2011  4:45 PM  ------       Message from: Eulah Pont       Created: Fri Oct 19, 2011  4:06 PM       Regarding: Dr. Jacquelyne Balint         Pt is requesting refills for Rx Synthroid 0.112 mg a 90 day supply for mail order sent to pt's home.  Pt can be reached at [phone].

## 2011-10-20 LAB — METABOLIC PANEL, COMPREHENSIVE
A-G Ratio: 1.5 (ref 1.1–2.5)
ALT (SGPT): 14 IU/L (ref 0–32)
AST (SGOT): 18 IU/L (ref 0–40)
Albumin: 4.1 g/dL (ref 3.5–4.8)
Alk. phosphatase: 100 IU/L (ref 25–165)
BUN/Creatinine ratio: 19 (ref 11–26)
BUN: 12 mg/dL (ref 8–27)
Bilirubin, total: 0.2 mg/dL (ref 0.0–1.2)
CO2: 25 mmol/L (ref 20–32)
Calcium: 9.2 mg/dL (ref 8.6–10.2)
Chloride: 103 mmol/L (ref 97–108)
Creatinine: 0.63 mg/dL (ref 0.57–1.00)
GFR est non-AA: 91 mL/min/{1.73_m2} (ref >59)
GLOBULIN, TOTAL: 2.8 g/dL (ref 1.5–4.5)
Glucose: 114 mg/dL — ABNORMAL HIGH (ref 65–99)
Potassium: 3.9 mmol/L (ref 3.5–5.2)
Protein, total: 6.9 g/dL (ref 6.0–8.5)
Sodium: 144 mmol/L (ref 134–144)
eGFR If African American: 104 mL/min/{1.73_m2} (ref >59)

## 2011-10-20 LAB — LIPID PANEL
Cholesterol, total: 184 mg/dL (ref 100–199)
HDL Cholesterol: 64 mg/dL (ref >39)
LDL, calculated: 108 mg/dL — ABNORMAL HIGH (ref 0–99)
Triglyceride: 60 mg/dL (ref 0–149)
VLDL, calculated: 12 mg/dL (ref 5–40)

## 2011-10-20 LAB — TSH 3RD GENERATION: TSH: 0.62 u[IU]/mL (ref 0.450–4.500)

## 2011-10-20 NOTE — Telephone Encounter (Signed)
Quick Note:    Message sent about labs    ______

## 2011-10-29 ENCOUNTER — Encounter

## 2011-10-29 MED ORDER — SYNTHROID 112 MCG TABLET
112 mcg | ORAL_TABLET | Freq: Every day | ORAL | Status: DC
Start: 2011-10-29 — End: 2011-11-05

## 2011-10-29 NOTE — Telephone Encounter (Signed)
Please mail script to patient so she can send to mail order. Gets 9 pills a week would like 90 day script..requested last week

## 2011-11-05 ENCOUNTER — Telehealth

## 2011-11-05 MED ORDER — SYNTHROID 112 MCG TABLET
112 mcg | ORAL_TABLET | Freq: Every day | ORAL | Status: DC
Start: 2011-11-05 — End: 2011-11-12

## 2011-11-05 NOTE — Telephone Encounter (Signed)
Message copied by Hazeline Junker on Mon Nov 05, 2011 11:33 AM  ------       Message from: Diona Browner       Created: Mon Nov 05, 2011 11:27 AM       Regarding: Dr. Jacquelyne Balint         Patient is calling to check the status of her Rx for Synthroid that is to be mailed to her home address, pt submitted the requests 2 weeks ago. The best number for the patient is [phone].

## 2011-11-05 NOTE — Telephone Encounter (Signed)
Mailed new script to pt.

## 2011-11-12 ENCOUNTER — Encounter

## 2011-11-12 MED ORDER — SYNTHROID 112 MCG TABLET
112 mcg | ORAL_TABLET | Freq: Every day | ORAL | Status: DC
Start: 2011-11-12 — End: 2012-08-28

## 2011-11-12 NOTE — Telephone Encounter (Signed)
Pt still has not received script in mail. Here to pickup copy

## 2011-12-27 NOTE — Progress Notes (Signed)
"  REVIEWED RECORD IN PREPARATION FOR VISIT AND HAVE OBTAINED NECESSARY DOCUMENTATION"

## 2011-12-27 NOTE — Progress Notes (Signed)
HISTORY OF PRESENT ILLNESS  Jillian Bean is a 71 y.o. female.  HPI  She did see dr Vivien Rossetti and he did an egd and did not find a source of anemia-she notes in last 2 weeks increased diarrhea -in am 3 loose stools black colored on iron. Eats and then 1 hour later loose stool.  Again has trouble after dinner. She had a colonoscopy sev years ago but not in last year. She did try immodium this weekend with good results. No fevers. No brbpr.   Review of Systems   Constitutional: Negative for fever and malaise/fatigue.   Respiratory: Negative for shortness of breath.    Cardiovascular: Negative for chest pain.   Gastrointestinal: Positive for abdominal pain and diarrhea. Negative for nausea, vomiting, blood in stool and melena.       Physical Exam   Nursing note and vitals reviewed.  Constitutional: She is oriented to person, place, and time. She appears well-developed and well-nourished.   HENT:   Head: Normocephalic and atraumatic.   Neck: Normal range of motion. Neck supple. Carotid bruit is not present. No thyromegaly present.   Cardiovascular: Normal rate, regular rhythm, S1 normal, S2 normal, normal heart sounds and intact distal pulses.    No murmur heard.  Pulmonary/Chest: Effort normal and breath sounds normal. No respiratory distress. She has no wheezes. She has no rales.   Abdominal: Soft. Bowel sounds are normal. She exhibits no distension and no mass. There is no tenderness. There is no guarding.   Musculoskeletal: She exhibits no edema.   Neurological: She is alert and oriented to person, place, and time.   Psychiatric: She has a normal mood and affect. Her behavior is normal.       ASSESSMENT and PLAN  Jillian Bean was seen today for diarrhea.    Diagnoses and associated orders for this visit:    Diarrhea-i am concerned about anemia with diarrhea and suspect she will need a colonoscopy she made appt with dr Vivien Rossetti for early  june  - CBC WITH AUTOMATED DIFF  - TSH, 3RD GENERATION  - METABOLIC PANEL,  COMPREHENSIVE  - C DIFFICILE TOXIN A & B BY EIA  - CULTURE, STOOL  - OVA + PARASITE EXAM  - CT ABD W CONT; Future    Hypothyroidism    Anemia  - CT ABD W CONT; Future

## 2011-12-29 LAB — CBC WITH AUTOMATED DIFF
ABS. BASOPHILS: 0 10*3/uL (ref 0.0–0.2)
ABS. EOSINOPHILS: 0.1 10*3/uL (ref 0.0–0.4)
ABS. IMM. GRANS.: 0 10*3/uL (ref 0.0–0.1)
ABS. MONOCYTES: 0.7 10*3/uL (ref 0.1–1.0)
ABS. NEUTROPHILS: 5.2 10*3/uL (ref 1.8–7.8)
Abs Lymphocytes: 1.1 10*3/uL (ref 0.7–4.5)
BASOPHILS: 0 % (ref 0–3)
EOSINOPHILS: 1 % (ref 0–7)
HCT: 27.8 % — ABNORMAL LOW (ref 34.0–46.6)
HGB: 8.6 g/dL — ABNORMAL LOW (ref 11.1–15.9)
IMMATURE GRANULOCYTES: 0 % (ref 0–2)
Lymphocytes: 16 % (ref 14–46)
MCH: 24.9 pg — ABNORMAL LOW (ref 26.6–33.0)
MCHC: 30.9 g/dL — ABNORMAL LOW (ref 31.5–35.7)
MCV: 81 fL (ref 79–97)
MONOCYTES: 9 % (ref 4–13)
NEUTROPHILS: 74 % (ref 40–74)
PLATELET: 612 10*3/uL — ABNORMAL HIGH (ref 140–415)
RBC: 3.45 x10E6/uL — ABNORMAL LOW (ref 3.77–5.28)
RDW: 17.2 % — ABNORMAL HIGH (ref 12.3–15.4)
WBC: 7.2 10*3/uL (ref 4.0–10.5)

## 2011-12-29 LAB — METABOLIC PANEL, COMPREHENSIVE
A-G Ratio: 1.3 (ref 1.1–2.5)
ALT (SGPT): 12 IU/L (ref 0–32)
AST (SGOT): 14 IU/L (ref 0–40)
Albumin: 3.6 g/dL (ref 3.5–4.8)
Alk. phosphatase: 91 IU/L (ref 25–165)
BUN/Creatinine ratio: 17 (ref 11–26)
BUN: 10 mg/dL (ref 8–27)
Bilirubin, total: 0.3 mg/dL (ref 0.0–1.2)
CO2: 26 mmol/L (ref 20–32)
Calcium: 9.2 mg/dL (ref 8.6–10.2)
Chloride: 102 mmol/L (ref 97–108)
Creatinine: 0.6 mg/dL (ref 0.57–1.00)
GFR est non-AA: 91 mL/min/{1.73_m2} (ref >59)
GLOBULIN, TOTAL: 2.8 g/dL (ref 1.5–4.5)
Glucose: 100 mg/dL — ABNORMAL HIGH (ref 65–99)
Potassium: 3.5 mmol/L (ref 3.5–5.2)
Protein, total: 6.4 g/dL (ref 6.0–8.5)
Sodium: 142 mmol/L (ref 134–144)
eGFR If African American: 105 mL/min/{1.73_m2} (ref >59)

## 2011-12-29 LAB — TSH 3RD GENERATION: TSH: 3.7 u[IU]/mL (ref 0.450–4.500)

## 2011-12-30 LAB — C DIFFICILE TOXIN A & B BY EIA: C. difficile Toxins A+B: NEGATIVE

## 2011-12-30 NOTE — Progress Notes (Signed)
Quick Note:    Message needs further eval of ongoing anemia  ______

## 2012-01-01 LAB — OVA + PARASITE EXAM: Ova & Parasite exam: NONE SEEN

## 2012-01-01 LAB — CULTURE, STOOL

## 2012-01-01 MED ORDER — IOVERSOL 350 MG/ML IV SOLN
350 mg iodine/mL | Freq: Once | INTRAVENOUS | Status: AC
Start: 2012-01-01 — End: 2012-01-01
  Administered 2012-01-01: 15:00:00 via INTRAVENOUS

## 2012-01-01 NOTE — Progress Notes (Signed)
Quick Note:    Left a message looks good  ______

## 2012-01-01 NOTE — Progress Notes (Signed)
Quick Note:    Message sent about labs    ______

## 2012-03-27 MED ORDER — DULOXETINE 60 MG CAP, DELAYED RELEASE
60 mg | ORAL_CAPSULE | Freq: Every day | ORAL | Status: DC
Start: 2012-03-27 — End: 2012-03-29

## 2012-03-27 MED ORDER — DULOXETINE 60 MG CAP, DELAYED RELEASE
60 mg | ORAL_CAPSULE | Freq: Every day | ORAL | Status: DC
Start: 2012-03-27 — End: 2012-03-27

## 2012-03-27 NOTE — Progress Notes (Signed)
[facility] [place], [facility]  [address]  [place], [place] [address]  [phone] Fax              Chief Complaint   Patient presents with   ??? Back Pain     Follow up     Current Outpatient Prescriptions   Medication Sig Dispense Refill   ??? levothyroxine (SYNTHROID) 112 mcg tablet Take  by mouth Daily (before breakfast).       ??? DULoxetine (CYMBALTA) 60 mg capsule Take 1 Cap by mouth daily.  90 Cap  6   ??? PV W-O CAL/FERROUS FUMARATE/FA (M-VIT PO) Take  by mouth three (3) times daily.       ??? diclofenac (VOLTAREN) 1 % topical gel Apply 2 g to affected area four (4) times daily.  100 g  2   ??? fexofenadine (ALLEGRA) 180 mg tablet Take  by mouth daily.         ??? CALCIUM CARBONATE, [id number], (TUMS PO) Take  by mouth daily.       ??? ASPIR-81 PO take 81 mg by mouth daily.       ??? SYNTHROID 112 mcg tablet Take 1 Tab by mouth daily for 90 days. Extra pill 2 days a week for a total of 9 per week  108 Tab  1   ??? pravastatin (PRAVACHOL) 20 mg tablet Take 1 Tab by mouth daily.  90 Tab  3   ??? desonide (TRIDESILON) 0.05 % topical lotion Apply  to affected area two (2) times a day.  59 mL  2   ??? pravastatin (PRAVACHOL) 20 mg tablet Take 1 Tab by mouth daily for 90 doses.  90 Tab  3   ??? coenzyme q10 (CO Q-10) 10 mg Cap Take  by mouth.       ??? cholecalciferol, vitamin d3, (VITAMIN D) 1,000 unit tablet Take  by mouth daily.         Allergies   Allergen Reactions   ??? Celebrex (Celecoxib) Other (comments)     Aggitation   ??? Levaquin (Levofloxacin) Palpitations   ??? Nexium (Esomeprazole Magnesium) Diarrhea   ??? Yellow Dye Hives   ??? Zocor (Simvastatin) Other (comments)     Myalgias       History   Substance Use Topics   ??? Smoking status: Never Smoker    ??? Smokeless tobacco: Never Used   ??? Alcohol Use: No     Here for follow up chronic back pain  We have had her on Cymbalta  She has been seeing Dr. Doylene Bode  She is having more pain and getting spinal stim evaluation with Dr. Effie Berkshire   Tolerating her Cymbalta and thinks it is helping her  Anemic and getting Fe infusions and dx with colitis    ROS  Has had some weight loss as well.  Has been taken off her cholesterol meds.      Examination  BP 118/72   Pulse 85   Temp 98 ??F (36.7 ??C)   Ht 5\' 7"  (1.702 m)   Wt 143 lb 4.8 oz (65 kg)   BMI 22.44 kg/m2   SpO2 94%   LMP 04/13/2010  Awake, alert and oriented    Imp/Plan  Back pain  Cont per Dr. DePalma/PLacide  Cont Cymbalta  Follow prn

## 2012-03-27 NOTE — Progress Notes (Signed)
Follow up for back pain. No changes in back pain. Patient has appointment to be evaluated for spinal stimulator later this month. Patient is iron deficient and receiving iron infusions. Dx with lymphatic colitis.

## 2012-03-27 NOTE — Progress Notes (Signed)
"  REVIEWED RECORD IN PREPARATION FOR VISIT AND HAVE OBTAINED NECESSARY DOCUMENTATION"

## 2012-03-28 NOTE — Progress Notes (Signed)
HISTORY OF PRESENT ILLNESS  Jillian Bean is a 71 y.o. female.  HPI  Seen at follow-up.     Issues:  1.  Diarrhea.  She has been diagnosed with lymphocytic colitis by Dr. Vivien Rossetti.  She feels it may be related to the Pravachol because she stopped the medication and the diarrhea improved.  She restarted it and the diarrhea returned, and she is now off it again.    2.  Iron-deficiency anemia with negative GI evaluation.  She is now seeing hematology and has had two iron infusions.  She still feels tired.   3.  Dyslipidemia.  We discussed non-statin options and specifically WelChol and Zetia and fibrates.  At this point, we will hold off on starting new medications until her iron infusions have been completed.     MedDATA/jrc        Review of Systems   Constitutional: Positive for malaise/fatigue. Negative for fever, chills and weight loss.   Respiratory: Negative for cough, shortness of breath and wheezing.    Cardiovascular: Negative for chest pain, palpitations, orthopnea, leg swelling and PND.   Gastrointestinal: Negative for heartburn, nausea and diarrhea.   Musculoskeletal: Negative for myalgias.   Neurological: Negative for dizziness and headaches.       Physical Exam   Nursing note and vitals reviewed.  Constitutional: She is oriented to person, place, and time. She appears well-developed and well-nourished.   HENT:   Head: Normocephalic and atraumatic.   Neck: Normal range of motion. Neck supple. Carotid bruit is not present. No thyromegaly present.   Cardiovascular: Normal rate, regular rhythm, S1 normal, S2 normal, normal heart sounds and intact distal pulses.    No murmur heard.  Pulmonary/Chest: Effort normal and breath sounds normal. No respiratory distress. She has no wheezes. She has no rales.   Musculoskeletal: She exhibits no edema.   Neurological: She is alert and oriented to person, place, and time.   Psychiatric: She has a normal mood and affect. Her behavior is normal.       ASSESSMENT and PLAN   Lela was seen today for medication evaluation.    Diagnoses and associated orders for this visit:    Lymphocytic colitis  - VITAMIN D, 25-HYDROXY, TOTAL    Iron deficiency anemia  - CBC WITH AUTOMATED DIFF    Dyslipidemia-stay off pravachol as it seemed to cause the diarrhea-consider zetia in the future  - LIPID PANEL  - METABOLIC PANEL, COMPREHENSIVE  - TSH, 3RD GENERATION

## 2012-03-29 MED ORDER — DULOXETINE 60 MG CAP, DELAYED RELEASE
60 mg | ORAL_CAPSULE | Freq: Every day | ORAL | Status: DC
Start: 2012-03-29 — End: 2012-08-15

## 2012-04-30 LAB — CBC WITH AUTOMATED DIFF
ABS. BASOPHILS: 0 10*3/uL (ref 0.0–0.2)
ABS. EOSINOPHILS: 0.2 10*3/uL (ref 0.0–0.4)
ABS. IMM. GRANS.: 0 10*3/uL (ref 0.0–0.1)
ABS. MONOCYTES: 0.5 10*3/uL (ref 0.1–0.9)
ABS. NEUTROPHILS: 4.5 10*3/uL (ref 1.4–7.0)
Abs Lymphocytes: 0.9 10*3/uL (ref 0.7–3.1)
BASOPHILS: 1 % (ref 0–3)
EOSINOPHILS: 3 % (ref 0–5)
HCT: 30.6 % — ABNORMAL LOW (ref 34.0–46.6)
HGB: 9 g/dL — ABNORMAL LOW (ref 11.1–15.9)
IMMATURE GRANULOCYTES: 0 % (ref 0–2)
Lymphocytes: 15 % (ref 14–46)
MCH: 23.3 pg — ABNORMAL LOW (ref 26.6–33.0)
MCHC: 29.4 g/dL — ABNORMAL LOW (ref 31.5–35.7)
MCV: 79 fL (ref 79–97)
MONOCYTES: 7 % (ref 4–12)
NEUTROPHILS: 74 % (ref 40–74)
PLATELET: 465 10*3/uL — ABNORMAL HIGH (ref 155–379)
RBC: 3.87 x10E6/uL (ref 3.77–5.28)
RDW: 18.7 % — ABNORMAL HIGH (ref 12.3–15.4)
WBC: 6.1 10*3/uL (ref 3.4–10.8)

## 2012-04-30 LAB — METABOLIC PANEL, COMPREHENSIVE
A-G Ratio: 1.4 (ref 1.1–2.5)
ALT (SGPT): 11 IU/L (ref 0–32)
AST (SGOT): 13 IU/L (ref 0–40)
Albumin: 3.8 g/dL (ref 3.5–4.8)
Alk. phosphatase: 90 IU/L (ref 45–108)
BUN/Creatinine ratio: 20 (ref 11–26)
BUN: 13 mg/dL (ref 8–27)
Bilirubin, total: 0.3 mg/dL (ref 0.0–1.2)
CO2: 24 mmol/L (ref 19–28)
Calcium: 8.9 mg/dL (ref 8.6–10.2)
Chloride: 102 mmol/L (ref 97–108)
Creatinine: 0.66 mg/dL (ref 0.57–1.00)
GFR est AA: 102 mL/min/{1.73_m2} (ref >59)
GFR est non-AA: 89 mL/min/{1.73_m2} (ref >59)
GLOBULIN, TOTAL: 2.7 g/dL (ref 1.5–4.5)
Glucose: 93 mg/dL (ref 65–99)
Potassium: 4.2 mmol/L (ref 3.5–5.2)
Protein, total: 6.5 g/dL (ref 6.0–8.5)
Sodium: 141 mmol/L (ref 134–144)

## 2012-04-30 LAB — LIPID PANEL
Cholesterol, total: 161 mg/dL (ref 100–199)
HDL Cholesterol: 45 mg/dL (ref >39)
LDL, calculated: 95 mg/dL (ref 0–99)
Triglyceride: 104 mg/dL (ref 0–149)
VLDL, calculated: 21 mg/dL (ref 5–40)

## 2012-04-30 LAB — TSH 3RD GENERATION: TSH: 1.55 u[IU]/mL (ref 0.450–4.500)

## 2012-05-03 LAB — VITAMIN D, 25-HYDROXY, TOTAL: Vitamin D 25-Hydroxy: 37 ng/mL

## 2012-05-04 NOTE — Progress Notes (Signed)
Quick Note:    Message sent about labs    ______

## 2012-05-06 NOTE — Progress Notes (Signed)
Subjective:   71 y.o. female for annual medical exam.   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 Iron deficiency anemia 03/27/2012   ??? 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 S/P colonoscopy 10/10/2007   ??? Hypercholesterolemia 09/01/2007   ??? OA (Osteoarthritis) 09/01/2007   ??? Back Pain 09/01/2007   ??? Plantar Fasciitis 09/01/2007   ??? Hypothyroidism 09/01/2003   ??? Colonic Polyps 08/31/1998     Current Outpatient Prescriptions   Medication Sig Dispense Refill   ??? diclofenac (VOLTAREN) 1 % topical gel Apply 2 g to affected area four (4) times daily.  100 g  2   ??? desonide (TRIDESILON) 0.05 % topical lotion Apply  to affected area two (2) times a day.  59 mL  2   ??? SYNTHROID 112 mcg tablet 9 pills per week  100 Tab  3   ??? DULoxetine (CYMBALTA) 60 mg capsule Take 1 Cap by mouth daily.  90 Cap  3   ??? PV W-O CAL/FERROUS FUMARATE/FA (M-VIT PO) Take  by mouth three (3) times daily.       ??? CALCIUM CARBONATE, [id number], (TUMS PO) Take  by mouth daily.       ??? cholecalciferol, vitamin d3, (VITAMIN D) 1,000 unit tablet Take  by mouth daily.       ??? ASPIR-81 PO take 81 mg by mouth daily.       ??? DISCONTD: levothyroxine (SYNTHROID) 112 mcg tablet Take  by mouth Daily (before breakfast).       ??? SYNTHROID 112 mcg tablet Take 1 Tab by mouth daily for 90 days. Extra pill 2 days a week for a total of 9 per week  108 Tab  1   ??? fexofenadine (ALLEGRA) 180 mg tablet Take  by mouth daily.         ??? pravastatin (PRAVACHOL) 20 mg tablet Take 1 Tab by mouth daily for 90  doses.  90 Tab  3     Allergies   Allergen Reactions   ??? Celebrex (Celecoxib) Other (comments)     Aggitation   ??? Levaquin (Levofloxacin) Palpitations   ??? Nexium (Esomeprazole Magnesium) Diarrhea   ??? Yellow Dye Hives   ??? Zocor (Simvastatin) Other (comments)     Myalgias       Past Medical History   Diagnosis Date   ??? Plantar fasciitis 09/01/2007   ??? Colonic polyps 08/31/1998   ??? OA (osteoarthritis) 09/01/2007   ??? Back pain 09/01/2007   ??? Hypercholesteremia 09/01/2007   ??? Hypothyroidism 09/01/2003   ??? S/P colonoscopy 10/10/2007   ??? 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 Hiatal hernia 07/11/2011     Dr.sobieski   ??? 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 Hx gyn  Hysterectomy in her 30's   ??? Pr breast surgery procedure unlisted 1993     Breast Reduction   ??? Hx orthopaedic 08/22/09     left TKR   ??? Hx heent 04/07/10     thyroid biopsy neg dr Suszanne Finch   ??? Endoscopy, colon, diagnostic 7/12/18/08     7/05 Dr Vivien Rossetti     Family History   Problem Relation Age of Onset   ??? Cancer Mother      Breast   ??? Heart Failure Father      MI   ??? Seizures Brother      ms     History   Substance Use Topics   ??? Smoking status: Never Smoker    ??? Smokeless tobacco: Never Used   ??? Alcohol Use: No             ROS: Feeling generally well. No TIA's or unusual headaches, no dysphagia.  No prolonged cough. No dyspnea or chest pain on exertion.  No abdominal pain, change in bowel habits, black or bloody stools.  No urinary tract symptoms.  No new or unusual musculoskeletal symptoms.    Specific concerns today: as above main concern is limited exercise due to hip and back pain.    Objective:   The patient appears well, alert, oriented x 3, in no distress.  BP 120/53   Pulse 83   Temp 98.8 ??F (37.1 ??C) (Oral)   Resp 14   Ht 5\' 7"  (1.702 m)   Wt 143 lb (64.864 kg)   BMI 22.40 kg/m2   LMP 04/13/2010  ENT normal.  Neck  supple. No adenopathy or thyromegaly. PERLA. Lungs are clear, good air entry, no wheezes, rhonchi or rales. S1 and S2 normal, no murmurs, regular rate and rhythm. Abdomen soft without tenderness, guarding, mass or organomegaly. Extremities show no edema, normal peripheral pulses. Neurological is normal, no focal findings.  Breast no masses axillary nodes or discharge    Assessment/Plan:   Well Woman  Cont with hematology for anemia of chronic disease dr Jeannett Senior was seen today for well woman.    Diagnoses and associated orders for this visit:    Well adult exam-cont annual mammogram due in the winter and flu shot in [place]  Labs done and reviewed today-hgb 9.0 which is stable -may start on procrit soon    Djd (degenerative joint disease)  - diclofenac (VOLTAREN) 1 % topical gel; Apply 2 g to affected area four (4) times daily.    Dermatitis  - desonide (TRIDESILON) 0.05 % topical lotion; Apply  to affected area two (2) times a day.    Hypothyroidism  - SYNTHROID 112 mcg tablet; 9 pills per week

## 2012-05-06 NOTE — Progress Notes (Signed)
"  REVIEWED RECORD IN PREPARATION FOR VISIT AND HAVE OBTAINED NECESSARY DOCUMENTATION"

## 2012-06-09 NOTE — Addendum Note (Signed)
Addended by: Mahala Menghini on: 06/09/2012 11:45 AM     Modules accepted: Level of Service

## 2012-08-13 MED ADMIN — meclizine (ANTIVERT) tablet 25 mg: ORAL | @ 06:00:00 | NDC 65162044210

## 2012-08-13 MED ADMIN — ondansetron (ZOFRAN ODT) 4 mg tablet: ORAL | @ 06:00:00 | NDC 68462015740

## 2012-08-13 MED FILL — ONDANSETRON 4 MG TAB, RAPID DISSOLVE: 4 mg | ORAL | Qty: 1

## 2012-08-13 MED FILL — MECLIZINE 25 MG TAB: 25 mg | ORAL | Qty: 1

## 2012-08-13 NOTE — ED Notes (Signed)
Patient has been feeling dizzy and nauseous for a few days. Patient recently began taking procrit.

## 2012-08-13 NOTE — ED Provider Notes (Signed)
HPI Comments: Pt reports intermittent dizziness on and off for a week worse with lying flat and sitting up. Does extinguish after sitting still for a while. + nausea with it. No cp or palpitations. No cold sx or congestion. Pt started procrit a month ago and thinks it is related. Last injection was 3 weeks ago    Patient is a 72 y.o. female presenting with dizziness. The history is provided by the patient.   Dizziness  This is a new problem. Episode onset: 1 week. The problem has not changed since onset.There was no focality noted. Pertinent negatives include no focal weakness, no mental status change and no disorientation. There has been no fever. Pertinent negatives include no shortness of breath, no chest pain, no vomiting, no altered mental status, no confusion and no nausea.        Past Medical History   Diagnosis Date   ??? Plantar fasciitis 09/01/2007   ??? Colonic polyps 08/31/1998   ??? OA (osteoarthritis) 09/01/2007   ??? Back pain 09/01/2007   ??? Hypercholesteremia 09/01/2007   ??? Hypothyroidism 09/01/2003   ??? S/P colonoscopy 10/10/2007   ??? 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 Hiatal hernia 07/11/2011     Dr.sobieski   ??? 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 Hx gyn       Hysterectomy in her 24's   ??? Pr breast surgery procedure unlisted  1993     Breast Reduction   ??? Hx orthopaedic  08/22/09     left TKR   ??? Hx heent  04/07/10     thyroid biopsy neg dr Suszanne Finch   ??? Endoscopy, colon, diagnostic  7/12/18/08     7/05 Dr Vivien Rossetti         Family History   Problem Relation Age of Onset   ??? Cancer Mother      Breast   ??? Heart Failure Father      MI   ??? Seizures Brother      ms        History     Social History   ??? Marital Status: MARRIED     Spouse Name: N/A     Number of Children: N/A   ??? Years of Education: N/A     Occupational History   ??? Not on file.     Social History  Main Topics   ??? Smoking status: Never Smoker    ??? Smokeless tobacco: Never Used   ??? Alcohol Use: No   ??? Drug Use: No   ??? Sexually Active: Not Currently     Other Topics Concern   ??? Not on file     Social History Narrative   ??? No narrative on file                  ALLERGIES: Celebrex; Levaquin; Nexium; Pravastatin; Yellow dye; and Zocor      Review of Systems   Constitutional: Negative for fever.   Respiratory: Negative for shortness of breath.    Cardiovascular: Negative for chest pain.   Gastrointestinal: Negative.  Negative for nausea, vomiting and diarrhea.   Genitourinary: Negative.    Neurological: Positive for dizziness. Negative for focal weakness.   Psychiatric/Behavioral: Negative for confusion and altered mental status.   All other systems reviewed  and are negative.        Filed Vitals:    08/13/12 0019   BP: 153/70   Pulse: 72   Resp: 16   Height: 5\' 6"  (1.676 m)   Weight: 65.318 kg (144 lb)   SpO2: 98%            Physical Exam   Nursing note and vitals reviewed.  Constitutional: She is oriented to person, place, and time. She appears well-developed and well-nourished. No distress.   HENT:   Head: Normocephalic and atraumatic.   Right Ear: External ear normal.   Left Ear: External ear normal.   Mouth/Throat: Oropharynx is clear and moist. No oropharyngeal exudate.   Eyes: EOM are normal. Pupils are equal, round, and reactive to light.   Neck: Normal range of motion. Neck supple.   Cardiovascular: Normal rate, regular rhythm, normal heart sounds and intact distal pulses.  Exam reveals no friction rub.    No murmur heard.  Pulmonary/Chest: Effort normal and breath sounds normal. No respiratory distress. She has no wheezes. She has no rales. She exhibits no tenderness.   Abdominal: Soft. Bowel sounds are normal. She exhibits no distension. There is no tenderness. There is no rebound and no guarding.   Musculoskeletal: Normal range of motion.   Neurological: She is alert and oriented to person, place, and  time. No cranial nerve deficit. Coordination normal.   Finger to nose intact   Skin: Skin is warm and dry. She is not diaphoretic. No pallor.   Psychiatric: She has a normal mood and affect. Her behavior is normal.        MDM     Amount and/or Complexity of Data Reviewed:   Tests in the radiology section of CPT??:  Ordered and reviewed  Progress:   Patient progress:  Stable and improved      Procedures    1:51 AM  Pt ambulated and is feeling better. Not as dizzy. Hb checked last week and was 11. Will dc with meclizine follow up pcp. doubtits procrit since she hasnt had infusion in 3 weeks

## 2012-08-13 NOTE — ED Notes (Signed)
I have reviewed discharge instructions with the patient.  The patient verbalized understanding. Patient given prescription for Antivert and Zofran and verbalizes understanding to use these prescriptions as needed. Patient verbalizes need to follow up with primary care or return to the ER if symptoms change or worsen. No further questions or requests at time of discharge from ED. Patient ambulatory and stable.

## 2012-08-15 NOTE — Progress Notes (Signed)
"  REVIEWED RECORD IN PREPARATION FOR VISIT AND HAVE OBTAINED NECESSARY DOCUMENTATION"

## 2012-08-15 NOTE — Progress Notes (Signed)
HISTORY OF PRESENT ILLNESS  Jillian Bean is a 72 y.o. female.  HPI  Seen at follow up for vertigo.  She developed room spinning with nausea New Year's Eve.  Went to the emergency room.  Head CT was normal.  Was given Meclizine and Zofran.  Has not needed the Zofran today.  Is still taking Meclizine b.i.d., but feels somewhat better.  Denied palpitations or visual changes or chest pain.  Her anemia has improved.  Most recent hemoglobin up to 11.7.  She has had two doses of Procrit.  She has seen Dr. Ranae Pila for follow up on this.  She plans to have knee replacement with Dr. Georgina Snell at the end of January.  She is off her Pravachol and generally feels better.    MedDATA/mth      Review of Systems   Constitutional: Negative for fever, chills and weight loss.   Eyes: Negative for blurred vision.   Respiratory: Negative for cough, shortness of breath and wheezing.    Cardiovascular: Negative for chest pain, palpitations, orthopnea, leg swelling and PND.   Gastrointestinal: Negative for heartburn, nausea, vomiting and abdominal pain.   Musculoskeletal: Positive for joint pain. Negative for myalgias and falls.   Neurological: Positive for dizziness. Negative for headaches.       Physical Exam   Nursing note and vitals reviewed.  Constitutional: She appears well-developed and well-nourished.   HENT:   Head: Normocephalic.   Right Ear: Tympanic membrane, external ear and ear canal normal.   Left Ear: Tympanic membrane, external ear and ear canal normal.   Nose: Nose normal.   Mouth/Throat: Oropharynx is clear and moist and mucous membranes are normal. No oropharyngeal exudate.   Eyes: Conjunctivae are normal. Pupils are equal, round, and reactive to light. Right eye exhibits no discharge. Left eye exhibits no discharge.   Neck: Normal range of motion. Neck supple.   Cardiovascular: Normal rate, regular rhythm and normal heart sounds.    Pulmonary/Chest: Effort normal and breath sounds normal. No respiratory distress. She  has no wheezes. She has no rales.   Musculoskeletal:   Walks with a limp from knee pain   Lymphadenopathy:     She has no cervical adenopathy.       ASSESSMENT and PLAN  Jillian Bean was seen today for follow-up.    Diagnoses and associated orders for this visit:    Back pain-doing better-wants to begin to titrate down on her cymbalta drop from 60 to 30 mg  - DULoxetine (CYMBALTA) 30 mg capsule; Take 1 Cap by mouth daily.    Vertigo-improving taper off meclizine in next 10 days  - meclizine (ANTIVERT) 25 mg tablet; Take 1 Tab by mouth three (3) times daily as needed for Dizziness.    Iron deficiency anemia  Better with iron supplements and procrit

## 2012-08-25 LAB — METABOLIC PANEL, BASIC
Anion gap: 10 mmol/L (ref 5–15)
BUN/Creatinine ratio: 25 — ABNORMAL HIGH (ref 12–20)
BUN: 13 MG/DL (ref 6–20)
CO2: 28 MMOL/L (ref 21–32)
Calcium: 9.2 MG/DL (ref 8.5–10.1)
Chloride: 101 MMOL/L (ref 97–108)
Creatinine: 0.52 MG/DL (ref 0.45–1.15)
GFR est AA: >60 mL/min/{1.73_m2} (ref >60)
GFR est non-AA: >60 mL/min/{1.73_m2} (ref >60)
Glucose: 97 MG/DL (ref 65–100)
Potassium: 4 MMOL/L (ref 3.5–5.1)
Sodium: 139 MMOL/L (ref 136–145)

## 2012-08-25 LAB — CBC W/O DIFF
HCT: 34.3 % — ABNORMAL LOW (ref 35.0–47.0)
HGB: 10.7 g/dL — ABNORMAL LOW (ref 11.5–16.0)
MCH: 27.9 PG (ref 26.0–34.0)
MCHC: 31.2 g/dL (ref 30.0–36.5)
MCV: 89.6 FL (ref 80.0–99.0)
PLATELET: 442 10*3/uL — ABNORMAL HIGH (ref 150–400)
RBC: 3.83 M/uL (ref 3.80–5.20)
RDW: 15.5 % — ABNORMAL HIGH (ref 11.5–14.5)
WBC: 7.4 10*3/uL (ref 3.6–11.0)

## 2012-08-25 LAB — HCG QL SERUM: HCG, Ql.: NEGATIVE

## 2012-08-25 LAB — URINALYSIS W/ REFLEX CULTURE
Bacteria: NEGATIVE /HPF
Bilirubin: NEGATIVE
Blood: NEGATIVE
Glucose: NEGATIVE MG/DL
Ketone: NEGATIVE MG/DL
Leukocyte Esterase: NEGATIVE
Nitrites: NEGATIVE
Protein: NEGATIVE MG/DL
Specific gravity: 1.021 (ref 1.003–1.030)
Urobilinogen: 1 EU/DL (ref 0.2–1.0)
pH (UA): 6.5 (ref 5.0–8.0)

## 2012-08-25 LAB — EKG, 12 LEAD, INITIAL
Atrial Rate: 73 {beats}/min
Calculated P Axis: 57 degrees
Calculated R Axis: 2 degrees
Calculated T Axis: 26 degrees
Diagnosis: NORMAL
P-R Interval: 152 ms
Q-T Interval: 392 ms
QRS Duration: 86 ms
QTC Calculation (Bezet): 431 ms
Ventricular Rate: 73 {beats}/min

## 2012-08-25 LAB — HEMOGLOBIN A1C WITH EAG
Est. average glucose: 103 mg/dL
Hemoglobin A1c: 5.2 % (ref 4.2–6.3)

## 2012-08-25 LAB — PROTHROMBIN TIME + INR
INR: 1 (ref 0.9–1.1)
Prothrombin time: 10.8 s (ref 9.4–11.7)

## 2012-08-25 NOTE — Other (Signed)
PT GIVEN SURGICAL SITE INFORMATION FAQ'S INFORMATION HANDOUT

## 2012-08-26 LAB — TYPE & SCREEN
ABO/Rh(D): O POS
Antibody screen: NEGATIVE

## 2012-08-26 LAB — CULTURE, MRSA

## 2012-08-26 LAB — TYPE AND SCREEN
ABO/Rh: O POS
Antibody Screen: NEGATIVE

## 2012-08-26 NOTE — Other (Signed)
HIGH PLATELET COUNT CALLED TO [facility]. ALL RESULTS FAXED TO OFFICE.

## 2012-08-28 NOTE — Patient Instructions (Addendum)
Total Knee Replacement: Before Your Surgery  What is a total knee replacement?  A total knee replacement replaces the worn ends of the bones where they meet at the knee. Those bones are the thighbone (femur) and the lower leg bone (tibia). Your doctor will remove the damaged bone. Then he or she will replace it with plastic and metal parts. These new parts may be attached to your bones with cement.  Your doctor will make a cut down the center of your knee. This cut is called an incision. It will be about 8 to 10 inches long. Sometimes the surgery can be done with a smaller incision that is 4 to 6 inches. Both kinds of incisions leave scars that usually fade with time.  You probably will be able to go home about 3 to 5 days after surgery. If you have both knees done at the same time, you may need to be in the hospital for 1 week. If there is no one to help you at home, you may go to a rehab center.  Most people go back to normal activities or work in 4 to 16 weeks. This depends on your health. It also depends on how well your knee does in your rehab program. This may take longer if you have both knees done at the same time.  Follow-up care is a key part of your treatment and safety. Be sure to make and go to all appointments, and call your doctor if you are having problems. It's also a good idea to know your test results and keep a list of the medicines you take.  What happens before surgery?  Surgery can be stressful. This information will help you understand what you can expect. And it will help you safely prepare for surgery.  Preparing for surgery  ?? Understand exactly what surgery is planned, along with the risks, benefits, and other options.  ?? Tell your doctors ALL the medicines, vitamins, supplements, and herbal remedies you take. Some of these can increase the risk of bleeding or interact with anesthesia.  ?? If you take blood thinners, such as warfarin (Coumadin), clopidogrel (Plavix), or aspirin, be sure  to talk to your doctor. He or she will tell you if you should stop taking these medicines before your surgery. Make sure that you understand exactly what your doctor wants you to do.  ?? Your doctor will tell you which medicines to take or stop before your surgery. You may need to stop taking certain medicines a week or more before surgery. So talk to your doctor as soon as you can.  ?? If you have an advance directive, let your doctor know. It may include a living will and a durable power of attorney for health care. Bring a copy to the hospital. If you don't have one, you may want to prepare one. It lets your doctor and loved ones know your health care wishes. Doctors advise that everyone prepare these papers before any type of surgery or procedure.  ?? You may need to shower or bathe with a special soap the night before and the morning of your surgery. The soap contains chlorhexidine. It reduces the amount of bacteria on your skin that could cause an infection after surgery.  What happens on the day of surgery?  ?? Follow the instructions exactly about when to stop eating and drinking. If you don't, your surgery may be canceled. If your doctor told you to take your medicines on the day  of surgery, take them with only a sip of water.  ?? Take a bath or shower before you come in for your surgery. Do not apply lotions, perfumes, deodorants, or nail polish.  ?? Do not shave the surgical site yourself.  ?? Take off all jewelry and piercings. And take out contact lenses, if you wear them.  At the hospital or surgery center  ?? Bring a picture ID.  ?? The area for surgery is often marked to make sure there are no errors.  ?? You will be kept comfortable and safe by your anesthesia provider. The anesthesia may make you sleep. Or it may just numb the area being worked on.  ?? You also will get antibiotics through the IV tube before surgery. This lowers the risk of an infection of the incision.  ?? The surgery will take about 2 to 3  hours.  Going home  ?? Be sure you have someone to drive you home. Anesthesia and pain medicine make it unsafe for you to drive.  ?? You will be given more specific instructions about recovering from your surgery. They will cover things like diet, wound care, follow-up care, driving, and getting back to your normal routine.  When should you call your doctor?  ?? You have questions or concerns.  ?? You don't understand how to prepare for your surgery.  ?? You become ill before the surgery (such as fever, flu, or a cold).  ?? You need to reschedule or have changed your mind about having the surgery.    Where can you learn more?    Go to [email or web address]   Enter [phone] in the search box to learn more about "Total Knee Replacement: Before Your Surgery."    ?? 2006-2013 [employer]. Care instructions adapted under license by [employer] (which disclaims liability or warranty for this information). This care instruction is for use with your licensed healthcare professional. If you have questions about a medical condition or this instruction, always ask your healthcare professional. [employer] disclaims any warranty or liability for your use of this information.  Content Version: 9.9.209917; Last Revised: December 05, 2011

## 2012-08-28 NOTE — Progress Notes (Signed)
HISTORY OF PRESENT ILLNESS  Jillian Bean is a 72 y.o. female.  HPI  Jillian Bean was referred for evaluation by: Dr. Trenda Moots for Pre- Op Evaluation.  Please see encounter details and orders for consultative summary.    Type of surgery : Total knee replacement  Surgery site : Right knee  Anesthesia type: to be determined -- spinal versus general  Date of procedure:  09/03/2012    Allergies:   Allergies   Allergen Reactions   ??? Celebrex (Celecoxib) Other (comments)     Aggitation   ??? Gabapentin Other (comments)     FELT UNSTEADY ON MY FEET   ??? Levaquin (Levofloxacin) Palpitations   ??? Nexium (Esomeprazole Magnesium) Diarrhea   ??? Pravastatin Nausea and Vomiting   ??? Yellow Dye Hives   ??? Zocor (Simvastatin) Other (comments)     Myalgias       Latex allergy: no  Prior reactions to anesthesia:  Mild nausea     Functional status:  she is able to ambulate up 2 flights of step without shortness of breath, chest pain  Prior cardiac evaluation:  EKG NSR    Has history of iron deficiency anemia and is scheduled to follow up with hematology 1 day prior to her surgery; has been doing well on Procrit.    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     Current Outpatient Prescriptions   Medication Sig   ??? acetaminophen (TYLENOL ARTHRITIS) 650 mg CR tablet Take 650 mg by mouth every six (6) hours as needed for Pain. Taking every 7-8 hours   ??? epoetin alfa (PROCRIT) 10,000 unit/mL injection by SubCUTAneous route once.   ??? OTHER    ??? DULoxetine (CYMBALTA) 30 mg capsule Take 1 Cap by mouth daily.   ??? SYNTHROID 112 mcg tablet 9 pills per week   ??? FEXOFENADINE HCL (ALLEGRA PO) Take  by mouth.   ??? multivitamin (ONE A DAY) tablet Take 1 Tab by mouth daily.   ??? meclizine (ANTIVERT) 25 mg tablet Take 1 Tab by mouth three (3) times daily as needed for Dizziness.   ??? ondansetron (ZOFRAN ODT) 4 mg disintegrating tablet Take 1 Tab by mouth every eight (8) hours as  needed for Nausea.   ??? diclofenac (VOLTAREN) 1 % topical gel Apply 2 g to affected area four (4) times daily.   ??? desonide (TRIDESILON) 0.05 % topical lotion Apply  to affected area two (2) times a day.   ??? [DISCONTINUED] SYNTHROID 112 mcg tablet Take 1 Tab by mouth daily for 90 days. Extra pill 2 days a week for a total of 9 per week   ??? pravastatin (PRAVACHOL) 20 mg tablet Take 1 Tab by mouth daily for 90 doses.   ??? CALCIUM CARBONATE, [id number], (TUMS PO) Take  by mouth daily.   ??? cholecalciferol, vitamin d3, (VITAMIN D) 1,000 unit tablet Take  by mouth daily.   ??? ASPIR-81 PO take 81 mg by mouth daily.     No current facility-administered medications for this visit.     Past Medical History   Diagnosis Date   ??? Plantar fasciitis 09/01/2007   ??? Colonic polyps 08/31/1998   ??? OA (osteoarthritis) 09/01/2007   ??? Back pain 09/01/2007   ??? Hypercholesteremia 09/01/2007   ??? Hypothyroidism 09/01/2003   ??? S/P colonoscopy 10/10/2007   ??? 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 Hiatal hernia 07/11/2011     Dr.sobieski   ??? Lymphocytic colitis 03/27/2012     colonoscopy 6/13 dr Vivien Rossetti   ??? Thyroid disease    ??? Coagulation disorder      IRON DEFFICIENCY ANEMIA   ??? Nausea & vomiting    ??? Other ill-defined conditions      VERTIGO     Past Surgical History   Procedure Laterality Date   ??? Hx gyn       Hysterectomy in her 60's   ??? Pr breast surgery procedure unlisted  1993     Breast Reduction   ??? Endoscopy, colon, diagnostic  7/12/18/08     7/05 Dr Vivien Rossetti   ??? Hx other surgical       MINIMALLY INVASIVE LUMBAR DECOMPRESSION   ??? Hx orthopaedic  08/22/09     left TKR   ??? Hx gi       COLONOSCOPY   ??? Hx heent  04/07/10     thyroid biopsy neg dr Suszanne Finch   ??? Hx heent       wisdom teeth extraction     History   Substance Use Topics   ??? Smoking status: Never Smoker    ??? Smokeless tobacco: Never Used   ??? Alcohol Use: No       Blood pressure 108/55, pulse 81, temperature 98.2 ??F (36.8  ??C), temperature source Oral, resp. rate 14, height 5\' 6"  (1.676 m), weight 145 lb 6.4 oz (65.953 kg), last menstrual period 04/13/2010, SpO2 96.00%.      Review of Systems   Constitutional: Negative for fever, chills and malaise/fatigue.   HENT: Negative for congestion and sore throat.    Respiratory: Negative for cough, sputum production and shortness of breath.    Cardiovascular: Negative for chest pain, palpitations and leg swelling.   Gastrointestinal: Negative for nausea, vomiting, abdominal pain and blood in stool.   Genitourinary: Negative for dysuria, frequency and hematuria.   Musculoskeletal: Positive for joint pain (right knee pain). Negative for myalgias.   Skin: Negative for rash.   Neurological: Negative for dizziness, tingling and headaches.   Psychiatric/Behavioral: Negative for depression.       Physical Exam   Nursing note and vitals reviewed.  Constitutional: She is oriented to person, place, and time. She appears well-developed and well-nourished. No distress.   HENT:   Head: Normocephalic and atraumatic.   Right Ear: External ear normal.   Left Ear: External ear normal.   Nose: Nose normal.   Mouth/Throat: Oropharynx is clear and moist.   Eyes: Conjunctivae are normal.   Neck: Normal range of motion. Neck supple. No thyromegaly present.   Cardiovascular: Normal rate and regular rhythm.    Pulmonary/Chest: Effort normal and breath sounds normal. She has no wheezes.   Abdominal: Soft. Bowel sounds are normal. There is no tenderness. There is no rebound.   Musculoskeletal:        Right knee: She exhibits decreased range of motion and swelling.   Lymphadenopathy:     She has no cervical adenopathy.   Neurological: She is alert and oriented to person, place, and time.   Skin: Skin is warm and dry. No rash noted.   Psychiatric: She has a normal mood and affect. Her behavior is normal.       ASSESSMENT and PLAN  Jillian Bean was seen today for pre-op exam and allergies.    Diagnoses and associated orders  for this visit:  OA (osteoarthritis)    Preop general physical exam -- considered low risk for cardiopulmonary complications related to anesthesia for upcoming Right total knee replacement.    Iron deficiency anemia -- due to see hematology prior to surgery; has been stable, feeling better with Procrit.    Lymphocytic colitis -- seems to have resolved after discontinuation of Pravastatin.    Other Orders  - acetaminophen (TYLENOL ARTHRITIS) 650 mg CR tablet; Take 650 mg by mouth every six (6) hours as needed for Pain. Taking every 7-8 hours  - FEXOFENADINE HCL (ALLEGRA PO); Take  by mouth.        lab results and schedule of future lab studies reviewed with patient  reviewed diet, exercise and weight control  reviewed medications and side effects in detail

## 2012-09-02 NOTE — Addendum Note (Signed)
Addended byTrenda Moots on: 09/02/2012 09:49 PM     Modules accepted: Orders

## 2012-09-03 ENCOUNTER — Inpatient Hospital Stay
Admit: 2012-09-03 | Discharge: 2012-09-05 | Disposition: A | Discharge status: Home Health | Payer: MEDICARE | Attending: Specialist | Admitting: Specialist

## 2012-09-03 DIAGNOSIS — M1710 Unilateral primary osteoarthritis, unspecified knee: Secondary | ICD-10-CM

## 2012-09-03 LAB — HEMOGLOBIN: HGB: 8.4 g/dL — ABNORMAL LOW (ref 11.5–16.0)

## 2012-09-03 MED ADMIN — midazolam (VERSED) injection: INTRAVENOUS | @ 14:00:00 | NDC 10019002837

## 2012-09-03 MED ADMIN — ceFAZolin in 0.9% NS (ANCEF) IVPB soln 2 g: INTRAVENOUS | @ 14:00:00 | NDC 09999966850

## 2012-09-03 MED ADMIN — warfarin (COUMADIN) tablet 3 mg: ORAL | @ 21:00:00 | NDC 00056017001

## 2012-09-03 MED ADMIN — morphine (pf) (DURAMORPH;ASTRAMORPH) 0.5 mg/mL injection: EPIDURAL | @ 15:00:00 | NDC 63323029197

## 2012-09-03 MED ADMIN — morphine injection 10 mg: SUBCUTANEOUS | @ 16:00:00 | NDC 00409126130

## 2012-09-03 MED ADMIN — warfarin - pharmacy to dose: @ 21:00:00 | NDC 00740100004

## 2012-09-03 MED ADMIN — ePHEDrine (MISTOLE) 50 mg/mL injection: INTRAVENOUS | @ 15:00:00 | NDC 00409307331

## 2012-09-03 MED ADMIN — ceFAZolin in 0.9% NS (ANCEF) IVPB soln 2 g: INTRAVENOUS | @ 22:00:00 | NDC 09999966850

## 2012-09-03 MED ADMIN — PHENYLephrine (NEOSYNEPHRINE) 10 mg in 0.9% sodium chloride 250 mL infusion: INTRAVENOUS | @ 16:00:00 | NDC 10019016339

## 2012-09-03 MED ADMIN — acetaminophen (OFIRMEV) infusion 1,000 mg: INTRAVENOUS | @ 21:00:00 | NDC 43825010201

## 2012-09-03 MED ADMIN — 0.9% sodium chloride infusion: INTRAVENOUS | @ 18:00:00 | NDC 00409798309

## 2012-09-03 MED ADMIN — bupivacaine liposome (PF) susp (EXPAREL) infiltration 266 mg: @ 15:00:00 | NDC 65250026620

## 2012-09-03 MED ADMIN — methylPREDNISolone acetate (DEPO-MEDROL) 40 mg/mL injection 40 mg: INTRA_ARTICULAR | @ 16:00:00 | NDC 00009307301

## 2012-09-03 MED ADMIN — tranexamic acid (CYKLOKAPRON) 1 g in 0.9% sodium chloride 110 mL IVPB: INTRAVENOUS | @ 15:00:00 | NDC 00517096010

## 2012-09-03 MED ADMIN — bupivacaine (PF) (MARCAINE) 0.75 % (7.5 mg/mL) injection: INTRATHECAL | @ 15:00:00 | NDC 00186103701

## 2012-09-03 MED ADMIN — propofol (DIPRIVAN) 10 mg/mL injection: INTRAVENOUS | @ 15:00:00 | NDC 63323026920

## 2012-09-03 MED ADMIN — lactated ringers infusion: INTRAVENOUS | @ 14:00:00 | NDC 00338011704

## 2012-09-03 MED ADMIN — ePHEDrine (MISTOLE) 50 mg/mL injection: INTRAVENOUS | @ 16:00:00 | NDC 00409307331

## 2012-09-03 MED FILL — LACTATED RINGERS IV: INTRAVENOUS | Qty: 1000

## 2012-09-03 MED FILL — EXPAREL (PF) 1.3 % (13.3 MG/ML) SUSPENSION FOR LOCAL INFILTRATION: 1.3 % (13.3 mg/mL) | Qty: 20

## 2012-09-03 MED FILL — FENTANYL CITRATE (PF) 50 MCG/ML IJ SOLN: 50 mcg/mL | INTRAMUSCULAR | Qty: 2

## 2012-09-03 MED FILL — EPHEDRINE SULFATE 50 MG/ML IJ SOLN: 50 mg/mL | INTRAMUSCULAR | Qty: 18

## 2012-09-03 MED FILL — PHARMACY INFORMATION NOTE: Qty: 1

## 2012-09-03 MED FILL — MECLIZINE 25 MG CHEWABLE TAB: 25 mg | ORAL | Qty: 1

## 2012-09-03 MED FILL — OFIRMEV 1,000 MG/100 ML (10 MG/ML) INTRAVENOUS SOLUTION: 1000 mg/100 mL (10 mg/mL) | INTRAVENOUS | Qty: 100

## 2012-09-03 MED FILL — DURAMORPH (PF) 0.5 MG/ML INJECTION SOLUTION: 0.5 mg/mL | INTRAMUSCULAR | Qty: 10

## 2012-09-03 MED FILL — SODIUM CHLORIDE 0.9 % IV: INTRAVENOUS | Qty: 1000

## 2012-09-03 MED FILL — LACTATED RINGERS IV: INTRAVENOUS | Qty: 1200

## 2012-09-03 MED FILL — TRANEXAMIC ACID 1,000 MG/10 ML (100 MG/ML) IV: 1000 mg/10 mL (100 mg/mL) | INTRAVENOUS | Qty: 10

## 2012-09-03 MED FILL — ONDANSETRON (PF) 4 MG/2 ML INJECTION: 4 mg/2 mL | INTRAMUSCULAR | Qty: 2

## 2012-09-03 MED FILL — CEFAZOLIN 2 GRAM/50 ML NS IVPB: INTRAVENOUS | Qty: 50

## 2012-09-03 MED FILL — PHENYLEPHRINE IN 0.9 % SODIUM CL (40 MCG/ML) IV SYRINGE: 0.4 mg/10 mL (40 mcg/mL) | INTRAVENOUS | Qty: 160

## 2012-09-03 MED FILL — BUPIVACAINE (PF) 0.75 % (7.5 MG/ML) IJ SOLN: 0.75 % (7.5 mg/mL) | INTRAMUSCULAR | Qty: 12

## 2012-09-03 MED FILL — MIDAZOLAM 1 MG/ML IJ SOLN: 1 mg/mL | INTRAMUSCULAR | Qty: 5

## 2012-09-03 MED FILL — EPHEDRINE SULFATE 50 MG/ML IJ SOLN: 50 mg/mL | INTRAMUSCULAR | Qty: 1

## 2012-09-03 MED FILL — COUMADIN 2 MG TABLET: 2 mg | ORAL | Qty: 1

## 2012-09-03 MED FILL — MORPHINE 10 MG/ML SYRINGE: 10 mg/mL | INTRAMUSCULAR | Qty: 1

## 2012-09-03 MED FILL — DIPRIVAN 10 MG/ML INTRAVENOUS EMULSION: 10 mg/mL | INTRAVENOUS | Qty: 250.75

## 2012-09-03 NOTE — Op Note (Signed)
[facility]  [address]  [place], [place] [address]    OPERATIVE REPORT - RIGHT TOTAL KNEE REPLACEMENT    Name: Jillian Bean  MRN: [id number]  DOB:  Oct 02, 1940  Age: 72 y.o.  Surgeon:  Karin Golden, MD  Date of Surgery: 09/03/2012  Final Diagnosis: Right knee end-stage osteoarthritis  Principal Procedure:  Right total knee replacement  First Assistant: Lucrezia Starch. Ruthell Rummage  Other Assistant:  Alecia Lemming, SA  Estimated Blood Loss:  50 cc  Specimen: Bone    Indications:  The patient has a history of severe end-stage osteoarthritis of the right knee.  Secondary to pain at rest as well as with activity and reported loss of function over time, the patient is indicated for right total knee replacement.    Description of Procedure:  The patient received spinal and 2 grams of IV Ancef was given one hour prior to skin incision.  The right leg was prepped and draped in routine fashion.  Esmarch and a pneumatic tourniquet were used.  The patient received tranexamic acid 1 gram IV prior to skin incision.    A medial vertical parapatellar incision was made.  Mid vastus approach of the knee was used.  The kneecap was everted.  Appropriate cuts were made in the distal femur, proximal tibia, and patella using Universal instrumentation system and the knee was placed in 4 degrees of valgus.  Methylmethacrylate with gentamycin was mixed and centrifuged.  Sequentially, the tibial, femoral, and patellar components were cemented in place.  The patient had a size 9 right narrow femoral component, a size D right tibial tray, a 10 mm CPS articular surface and a 35 mm all polyethylene patella was used.  Excess cement was removed.  Good hemostasis was obtained.    All surrounding soft tissues were injected with a combination solution of 25 mL 0.5% marcaine w/epinephrine, morphine (10mg ), and methylprednisolone (40mg ), 20 mL injectable saline, and Exparel 20 mg prior to close.    Deep fascia was closed with  interrupted 2-0 Vicryl suture, subcutaneous tissue was closed with 2-0 Vicryl, and 3-0 Vicryl subcuticular suture was used on the skin. Derma-bond was applied as well as a compressive dressing. There were no breaks in sterile technique. Krysten Veronica L. Elige Radon was the vital assistance during the performance of the procedure. The patient returned to the recovery room in satisfactory condition.    Aggie Hacker, PA-C

## 2012-09-03 NOTE — Anesthesia Procedure Notes (Signed)
Spinal Block    Reason for block: primary anesthetic  Staffing  Anesthesiologist: Gaetano Hawthorne R  Performed by: anesthesiologist   Prep  Risks and benefits discussed with the patient and plans are to proceed   Timeout performed  Patient was placed in seated position  Prep Solution(s): Betadine  Injection Technique: single-shot  Spinal  Spinal Location: L3-4  Needle: 25G pencil-tip  Attempts: needle passed 1 time(s)  CSF confirmed, no blood with aspiration and no paresthesia  Assessment  Insertion was uncomplicated and patient tolerated without any apparent complications

## 2012-09-03 NOTE — Progress Notes (Signed)
Bedside shift change report given to Heather, RN (oncoming nurse) by L.Welcher, RN (offgoing nurse).  Report given with SBAR.

## 2012-09-03 NOTE — H&P (Signed)
Date of Surgery Update:  Jillian Bean was seen and examined.  History and physical has been reviewed. There have been no significant clinical changes since the completion of the originally dated History and Physical.  Patient identified by surgeon; surgical site was confirmed by patient and surgeon.    Signed By: Aggie Hacker, PA-C     September 03, 2012 7:22 AM

## 2012-09-03 NOTE — Other (Signed)
Attempted to call report, nurse unavailable      At 1120  TRANSFER - OUT REPORT:    Verbal report given to Misty Stanley (name) on Jillian Bean  being transferred to 554(unit) for routine post - op       Report consisted of patient???s Situation, Background, Assessment and   Recommendations(SBAR).     Time Pre op antibiotic given:0920  Anesthesia Stop time: 1121    Information from the following report(s) SBAR, OR Summary, Intake/Output, MAR and Accordion was reviewed with the receiving nurse.    Opportunity for questions and clarification was provided.      The following personal items collected during your admission accompanied patient upon transfer:   Dental Appliance: Dental Appliances: At home;Lowers;Partials  Vision: Visual Aid: Glasses  Hearing Aid:    Jewelry:    Clothing: Clothing: Other (comment) (clothing bag to pauc)  Other Valuables:    Valuables sent to safe:      Clothes and glasses sent to floor with pt

## 2012-09-03 NOTE — Progress Notes (Signed)
Problem: Mobility Impaired (Adult and Pediatric)  Goal: *Acute Goals and Plan of Care (Insert Text)  Physical Therapy Goals  Initiated 09/03/2012    1. Patient will move from supine to sit and sit to supine in bed with modified independence within 4 days.  2. Patient will perform sit to stand with modified independence within 4 days.  3. Patient will ambulate with modified independence for 250 feet with the least restrictive device within 4 days.  4. Patient will ascend/descend 1 stairs with use of handrail(s) with supervision/set-up within 4 days.  5. Patient will perform home exercise program per protocol with independence within 4 days.  6. Patient will demonstrate AROM 0-90 degrees in operative joint within 4 days.  PHYSICAL THERAPY EVALUATION  Patient: Jillian Bean (71 y.o. female)  Date: 09/03/2012  Primary Diagnosis: DJD RIGHT KNEE  DJD RIGHT KNEE  DJD  Procedure(s) (LRB):  RIGHT TOTAL KNEE ARTHROPLASTY (GEN W/SPINAL) (Right) Day of Surgery   Precautions:  Fall;WBAT      ASSESSMENT :  Based on the objective data described below, the patient presents with decreased R knee ROM/strength, decreased sensation in BLE's and decreased functional independence compared to her baseline. She was able to stand EOB and take a few side steps with use of RW, min-mod A for safety. Pt with forward loss of balance requiring assist to maintain balance. Anticipate she will progress well with therapy once sensation improved. Recommend HHPT.    Patient will benefit from skilled intervention to address the above impairments.  Patient???s rehabilitation potential is considered to be Good  Factors which may influence rehabilitation potential include:   [X]          None noted  [ ]          Mental ability/status  [ ]          Medical condition  [ ]          Home/family situation and support systems  [ ]          Safety awareness  [ ]          Pain tolerance/management  [ ]          Other:        PLAN :  Recommendations and Planned  Interventions:  [X]            Bed Mobility Training             [ ]     Neuromuscular Re-Education  [X]            Transfer Training                   [ ]     Orthotic/Prosthetic Training  [X]            Gait Training                         [ ]     Modalities  [X]            Therapeutic Exercises           [ ]     Edema Management/Control  [X]            Therapeutic Activities            [X]     Patient and Family Training/Education  [ ]            Other (comment):    Frequency/Duration: Patient will be followed by physical therapy 2 times per day/4-7 days per  week to address goals.  Discharge Recommendations: Home Health  Further Equipment Recommendations for Discharge: none       SUBJECTIVE:   Patient stated ???My butt feels numb.???      OBJECTIVE DATA SUMMARY:       Past Medical History   Diagnosis Date   ??? Plantar fasciitis 09/01/2007   ??? Colonic polyps 08/31/1998   ??? OA (osteoarthritis) 09/01/2007   ??? Back pain 09/01/2007   ??? Hypercholesteremia 09/01/2007   ??? Hypothyroidism 09/01/2003   ??? S/P colonoscopy 10/10/2007   ??? 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 Hiatal hernia 07/11/2011       Dr.sobieski   ??? Lymphocytic colitis 03/27/2012       colonoscopy 6/13 dr Vivien Rossetti   ??? Thyroid disease     ??? Coagulation disorder         IRON DEFFICIENCY ANEMIA   ??? Nausea & vomiting     ??? Other ill-defined conditions         VERTIGO     Past Surgical History   Procedure Laterality Date   ??? Hx gyn           Hysterectomy in her 72's   ??? Pr breast surgery procedure unlisted   1993       Breast Reduction   ??? Endoscopy, colon, diagnostic   7/12/18/08       7/05 Dr Vivien Rossetti   ??? Hx other surgical           MINIMALLY INVASIVE LUMBAR DECOMPRESSION   ??? Hx orthopaedic   08/22/09       left TKR   ??? Hx gi           COLONOSCOPY   ??? Hx heent   04/07/10       thyroid biopsy neg dr Suszanne Finch   ??? Hx heent           wisdom teeth extraction     Prior Level of Function/Home Situation: independent    Home Situation  Home Environment: Private residence  # Steps to Enter: 0  One/Two Story Residence: Two story, live on 1st floor  Living Alone: No  Support Systems: Spouse  Patient Expects to be Discharged to:: Private residence  Current DME Used/Available at Home: Gilmer Mor, Field seismologist, rolling  Critical Behavior:  Neurologic State: Alert  Orientation Level: Oriented X4  Cognition: Appropriate decision making;Appropriate for age attention/concentration;Follows commands  Safety/Judgement: Awareness of environment  Skin:  Appears intact  Strength:    Strength: Generally decreased, functional     Tone & Sensation:   Tone: Normal  Sensation: Intact    Range Of Motion:  AROM: Generally decreased, functional     Coordination:  Coordination: Within functional limits    Functional Mobility:  Bed Mobility:  Supine to Sit: Supervision  Sit to Supine: Supervision     Transfers:  Sit to Stand: Minimum assistance;Assist X1;Verbal cues  Stand to Sit: Minimum assistance;Assist X1;Verbal cues        Balance:   Sitting: Intact  Standing: Intact;With support    Ambulation/Gait Training:  Distance (ft): 3 Feet (ft) (side steps toward [facility] after attempts standing forward)  Assistive Device: Walker, rolling;Gait belt  Ambulation - Level of Assistance: Minimal assistance;Moderate assistance (forward loss of balance with attempts at steps)  Gait Description (WDL): Exceptions to WDL  Gait Abnormalities: Decreased step clearance;Step to gait (moderate instability due to decreased sensation)  Right  Side Weight Bearing: As tolerated  Base of Support: Narrowed  Stance: Right decreased  Speed/Cadence: Slow;Shuffled  Step Length: Right shortened;Left shortened    Pain:  Pain Scale 1: Numeric (0 - 10)  Pain Intensity 1: 0  Pain Location 1: Knee  Pain Orientation 1: Right        Activity Tolerance:   No apparent distress  Please refer to the flowsheet for vital signs taken during this treatment.  After treatment:   [ ]          Patient left in no  apparent distress sitting up in chair  [X]          Patient left in no apparent distress in bed  [X]          Call bell left within reach  [X]          Nursing notified  [X]          Caregiver present  [ ]          Bed alarm activated      COMMUNICATION/EDUCATION:   The patient???s plan of care was discussed with: Registered Nurse.  [X]          Fall prevention education was provided and the patient/caregiver indicated understanding.  [X]          Patient/family have participated as able in goal setting and plan of care.  [X]          Patient/family agree to work toward stated goals and plan of care.  [ ]          Patient understands intent and goals of therapy, but is neutral about his/her participation.  [ ]          Patient is unable to participate in goal setting and plan of care.    Thank you for this referral.  Vonzell Schlatter Vellucci, PT   Time Calculation: 15 mins

## 2012-09-03 NOTE — Op Note (Signed)
[facility]  [address] [place] [address]    OPERATIVE REPORT - RIGHT TOTAL KNEE REPLACEMENT    Name: Jillian Bean  MRN: [id number]  DOB:  12/13/1940  Age: 71 y.o.  Surgeon:  Fred J. McGlynn, MD  Date of Surgery: 09/03/2012  Final Diagnosis: Right knee end-stage osteoarthritis  Principal Procedure:  Right total knee replacement  First Assistant: Denis Koppel L. Carthel Castille, PA-C  Other Assistant:  Patrick Thornton, SA  Estimated Blood Loss:  50 cc  Specimen: Bone    Indications:  The patient has a history of severe end-stage osteoarthritis of the right knee.  Secondary to pain at rest as well as with activity and reported loss of function over time, the patient is indicated for right total knee replacement.    Description of Procedure:  The patient received spinal and 2 grams of IV Ancef was given one hour prior to skin incision.  The right leg was prepped and draped in routine fashion.  Esmarch and a pneumatic tourniquet were used.  The patient received tranexamic acid 1 gram IV prior to skin incision.    A medial vertical parapatellar incision was made.  Mid vastus approach of the knee was used.  The kneecap was everted.  Appropriate cuts were made in the distal femur, proximal tibia, and patella using Universal instrumentation system and the knee was placed in 4 degrees of valgus.  Methylmethacrylate with gentamycin was mixed and centrifuged.  Sequentially, the tibial, femoral, and patellar components were cemented in place.  The patient had a size 9 right narrow femoral component, a size D right tibial tray, a 10 mm CPS articular surface and a 35 mm all polyethylene patella was used.  Excess cement was removed.  Good hemostasis was obtained.    All surrounding soft tissues were injected with a combination solution of 25 mL 0.5% marcaine w/epinephrine, morphine (10mg), and methylprednisolone (40mg), 20 mL injectable saline, and Exparel 20 mg prior to close.    Deep fascia was closed with  interrupted 2-0 Vicryl suture, subcutaneous tissue was closed with 2-0 Vicryl, and 3-0 Vicryl subcuticular suture was used on the skin. Derma-bond was applied as well as a compressive dressing. There were no breaks in sterile technique. Taneka Espiritu L. Jimya Ciani was the vital assistance during the performance of the procedure. The patient returned to the recovery room in satisfactory condition.    Rayen Dafoe L Jeorgia Helming, PA-C

## 2012-09-03 NOTE — Anesthesia Post-Procedure Evaluation (Signed)
Post-Anesthesia Evaluation and Assessment    Patient: Jillian Bean MRN: [id number]  SSN: [id number]    Date of Birth: 05-21-1941  Age: 72 y.o.  Sex: female       Cardiovascular Function/Vital Signs  Visit Vitals   Item Reading   ??? BP 109/50   ??? Pulse 83   ??? Temp 36.4 ??C (97.6 ??F)   ??? Resp 13   ??? Ht 5\' 6"  (1.676 m)   ??? Wt 65.318 kg (144 lb)   ??? BMI 23.25 kg/m2   ??? SpO2 100%       Patient is status post General anesthesia for Procedure(s):  RIGHT TOTAL KNEE ARTHROPLASTY (GEN W/SPINAL).    Nausea/Vomiting: None    Postoperative hydration reviewed and adequate.    Pain:  Pain Scale 1: Numeric (0 - 10) (09/03/12 1115)  Pain Intensity 1: 0 (09/03/12 1115)   Managed    Neurological Status:   Neuro (WDL): Within Defined Limits (09/03/12 1115)   Block resolving    Mental Status and Level of Consciousness: Alert and oriented     Pulmonary Status:   O2 Device: Nasal cannula (09/03/12 1121)   Adequate oxygenation and airway patent    Complications related to anesthesia: None    Post-anesthesia assessment completed. No concerns    Signed By: Glenard Haring. Arlisha Patalano, DO     September 03, 2012

## 2012-09-03 NOTE — Progress Notes (Signed)
TRANSFER - IN REPORT:    Verbal report received from Debbie, RN(name) on Jillian Bean  being received from [employer]) for routine post - op      Report consisted of patient???s Situation, Background, Assessment and   Recommendations(SBAR).     Information from the following report(s) SBAR was reviewed with the receiving nurse.    Opportunity for questions and clarification was provided.      Assessment completed upon patient???s arrival to unit and care assumed.

## 2012-09-03 NOTE — Anesthesia Procedure Notes (Signed)
Spinal Block    Reason for block: primary anesthetic  Staffing  Anesthesiologist: VICTORS, THOMAS R  Performed by: anesthesiologist   Prep  Risks and benefits discussed with the patient and plans are to proceed   Timeout performed  Patient was placed in seated position  Prep Solution(s): Betadine  Injection Technique: single-shot  Spinal  Spinal Location: L3-4  Needle: 25G pencil-tip  Attempts: needle passed 1 time(s)  CSF confirmed, no blood with aspiration and no paresthesia  Assessment  Insertion was uncomplicated and patient tolerated without any apparent complications

## 2012-09-03 NOTE — Anesthesia Pre-Procedure Evaluation (Signed)
Anesthetic History     PONV         Review of Systems / Medical History  Patient summary reviewed, nursing notes reviewed and pertinent labs reviewed    Pulmonary  Within defined limits               Neuro/Psych             Comments: LE pain R>L Cardiovascular  Within defined limits              Exercise tolerance: >4 METS     GI/Hepatic/Renal  Within defined limits                Endo/Other      Hypothyroidism  Arthritis     Other Findings   Comments: Prior lumbar surgery         Physical Exam    Airway  Mallampati: II  TM Distance: > 6 cm  Neck ROM: normal range of motion   Mouth opening: Normal     Cardiovascular  Regular rate and rhythm,  S1 and S2 normal,  no murmur, click, rub, or gallop             Dental    Dentition: Caps/crowns and Lower partial plate     Pulmonary  Breath sounds clear to auscultation               Abdominal  GI exam deferred       Other Findings            Anesthetic Plan    ASA: 2  Anesthesia type: spinal      Post-op pain plan if not by surgeon: intrathecal opiates    Induction: Intravenous  Anesthetic plan and risks discussed with: Patient

## 2012-09-03 NOTE — Other (Signed)
Tourniquet # [id number], applied by Ann Held RN pressure @ 300, inflated @ 9:44  Cuff padded with cast padding

## 2012-09-04 LAB — METABOLIC PANEL, BASIC
Anion gap: 7 mmol/L (ref 5–15)
BUN/Creatinine ratio: 12 (ref 12–20)
BUN: 6 MG/DL (ref 6–20)
CO2: 26 MMOL/L (ref 21–32)
Calcium: 8.3 MG/DL — ABNORMAL LOW (ref 8.5–10.1)
Chloride: 107 MMOL/L (ref 97–108)
Creatinine: 0.52 MG/DL (ref 0.45–1.15)
GFR est AA: >60 mL/min/{1.73_m2} (ref >60)
GFR est non-AA: >60 mL/min/{1.73_m2} (ref >60)
Glucose: 123 MG/DL — ABNORMAL HIGH (ref 65–100)
Potassium: 3.9 MMOL/L (ref 3.5–5.1)
Sodium: 140 MMOL/L (ref 136–145)

## 2012-09-04 LAB — PROTHROMBIN TIME + INR
INR: 1.2 — ABNORMAL HIGH (ref 0.9–1.1)
Prothrombin time: 12.6 s — ABNORMAL HIGH (ref 9.4–11.7)

## 2012-09-04 LAB — HEMOGLOBIN: HGB: 7.8 g/dL — ABNORMAL LOW (ref 11.5–16.0)

## 2012-09-04 MED ADMIN — acetaminophen (OFIRMEV) infusion 1,000 mg: INTRAVENOUS | @ 03:00:00 | NDC 43825010201

## 2012-09-04 MED ADMIN — levothyroxine (SYNTHROID) tablet 100 mcg: ORAL | @ 12:00:00 | NDC 00074662411

## 2012-09-04 MED ADMIN — acetaminophen (OFIRMEV) infusion 1,000 mg: INTRAVENOUS | @ 09:00:00 | NDC 43825010201

## 2012-09-04 MED ADMIN — therapeutic multivitamin (THERAGRAN) tablet 1 Tab: ORAL | @ 14:00:00

## 2012-09-04 MED ADMIN — sodium chloride (NS) flush 5-10 mL: INTRAVENOUS | @ 18:00:00 | NDC 87701099893

## 2012-09-04 MED ADMIN — warfarin (COUMADIN) tablet 3 mg: ORAL | @ 17:00:00 | NDC 00056017001

## 2012-09-04 MED ADMIN — HYDROcodone-acetaminophen (NORCO) 10-325 mg tablet 1 Tab: ORAL | @ 23:00:00 | NDC 68084010011

## 2012-09-04 MED ADMIN — 0.9% sodium chloride infusion: INTRAVENOUS | @ 03:00:00 | NDC 00409798309

## 2012-09-04 MED ADMIN — HYDROmorphone (PF) (DILAUDID) injection 0.5 mg: INTRAVENOUS | @ 20:00:00 | NDC 00409255201

## 2012-09-04 MED ADMIN — 0.9% sodium chloride infusion: INTRAVENOUS | @ 12:00:00 | NDC 00409798348

## 2012-09-04 MED ADMIN — DULoxetine (CYMBALTA) capsule 30 mg: ORAL | @ 14:00:00 | NDC 47335038283

## 2012-09-04 MED ADMIN — HYDROcodone-acetaminophen (NORCO) 10-325 mg tablet 1 Tab: ORAL | @ 18:00:00 | NDC 68084010011

## 2012-09-04 MED ADMIN — senna-docusate (PERICOLACE) 8.6-50 mg per tablet 1 Tab: ORAL | @ 16:00:00 | NDC 63739043201

## 2012-09-04 MED ADMIN — HYDROcodone-acetaminophen (NORCO) 10-325 mg tablet 1 Tab: ORAL | @ 14:00:00 | NDC 68084010011

## 2012-09-04 MED ADMIN — ceFAZolin in 0.9% NS (ANCEF) IVPB soln 2 g: INTRAVENOUS | @ 05:00:00 | NDC 09999966850

## 2012-09-04 MED ADMIN — warfarin - pharmacy to dose: @ 17:00:00 | NDC 00740100004

## 2012-09-04 MED ADMIN — calcium carbonate (TUMS) chewable tablet 200 mg: ORAL | @ 14:00:00 | NDC 66553000401

## 2012-09-04 MED ADMIN — cholecalciferol (VITAMIN D3) tablet 1,000 Units: ORAL | @ 14:00:00 | NDC 53191040901

## 2012-09-04 MED FILL — HYDROCODONE-ACETAMINOPHEN 10 MG-325 MG TAB: 10-325 mg | ORAL | Qty: 1

## 2012-09-04 MED FILL — SENSORCAINE-MPF/EPINEPHRINE 0.5 %-1:200,000 INJECTION SOLUTION: 0.5 %-1:200,000 | INTRAMUSCULAR | Qty: 30

## 2012-09-04 MED FILL — COUMADIN 2 MG TABLET: 2 mg | ORAL | Qty: 1

## 2012-09-04 MED FILL — SODIUM CHLORIDE 0.9 % INJECTION: INTRAMUSCULAR | Qty: 20

## 2012-09-04 MED FILL — OFIRMEV 1,000 MG/100 ML (10 MG/ML) INTRAVENOUS SOLUTION: 1000 mg/100 mL (10 mg/mL) | INTRAVENOUS | Qty: 100

## 2012-09-04 MED FILL — SENNA PLUS 8.6 MG-50 MG TABLET: ORAL | Qty: 1

## 2012-09-04 MED FILL — SYNTHROID 100 MCG TABLET: 100 mcg | ORAL | Qty: 1

## 2012-09-04 MED FILL — BACITRACIN 50,000 UNIT IM: 50000 unit | INTRAMUSCULAR | Qty: 50000

## 2012-09-04 MED FILL — THERAPEUTIC TABLET: ORAL | Qty: 1

## 2012-09-04 MED FILL — DEPO-MEDROL 40 MG/ML SUSPENSION FOR INJECTION: 40 mg/mL | INTRAMUSCULAR | Qty: 1

## 2012-09-04 MED FILL — HYDROMORPHONE (PF) 1 MG/ML IJ SOLN: 1 mg/mL | INTRAMUSCULAR | Qty: 1

## 2012-09-04 MED FILL — CEFAZOLIN 2 GRAM/50 ML NS IVPB: INTRAVENOUS | Qty: 50

## 2012-09-04 MED FILL — CALCIUM CARBONATE 200 MG (500 MG) CHEWABLE TAB: 200 mg calcium (500 mg) | ORAL | Qty: 1

## 2012-09-04 MED FILL — CYMBALTA 30 MG CAPSULE,DELAYED RELEASE: 30 mg | ORAL | Qty: 1

## 2012-09-04 MED FILL — VITAMIN D3 25 MCG (1,000 UNIT) TABLET: 25 mcg (1,000 unit) | ORAL | Qty: 1

## 2012-09-04 NOTE — Progress Notes (Signed)
Chart reviewed for utilization management and discharge needs/

## 2012-09-04 NOTE — Progress Notes (Signed)
Problem: Mobility Impaired (Adult and Pediatric)  Goal: *Acute Goals and Plan of Care (Insert Text)  Physical Therapy Goals  Initiated 09/03/2012    1. Patient will move from supine to sit and sit to supine in bed with modified independence within 4 days.  2. Patient will perform sit to stand with modified independence within 4 days.  3. Patient will ambulate with modified independence for 250 feet with the least restrictive device within 4 days.  4. Patient will ascend/descend 1 stairs with use of handrail(s) with supervision/set-up within 4 days.  5. Patient will perform home exercise program per protocol with independence within 4 days.  6. Patient will demonstrate AROM 0-90 degrees in operative joint within 4 days.  PHYSICAL THERAPY TREATMENT  Patient: Jillian Bean (72 y.o. female)  Date: 09/04/2012  Diagnosis: DJD RIGHT KNEE  DJD RIGHT KNEE  DJD Right knee DJD  Procedure(s) (LRB):  RIGHT TOTAL KNEE ARTHROPLASTY (GEN W/SPINAL) (Right) 1 Day Post-Op  Precautions: Fall;WBAT      ASSESSMENT:  Patient supine in bed, awake, noted she was beginning to feel pain in her knee.  Patient supervision to minA with transfers and bed mobility.  Patient ambulated 100 ft with RW and minA x1, patient reminded to bear weight through R leg as tolerated.  Patient educated on supine quad sets x10 per hr, sitting heel slides x10 per hr.  Patient tolerating therapy well, no complaints of dizziness or lightheadedness with ambulated/position changes.  Patient noted she would like more pain medication after treatment session, nursing notified.  Patient appropriate home with physical therapy consult upon DC home.  Progression toward goals:  [x]     Improving appropriately and progressing toward goals  [ ]     Improving slowly and progressing toward goals  [ ]     Not making progress toward goals and plan of care will be adjusted       PLAN:  Patient continues to benefit from skilled intervention to address the above impairments.  Continue  treatment per established plan of care.  Discharge Recommendations:  Home Health  Further Equipment Recommendations for Discharge:  TBD       SUBJECTIVE:   Patient stated ???I am feeling good this morning, I can feel some pain.???      OBJECTIVE DATA SUMMARY:   Critical Behavior:  Neurologic State: Alert  Orientation Level: Oriented X4  Cognition: Appropriate for age attention/concentration  Safety/Judgement: Awareness of environment  Functional Mobility Training:  Bed Mobility:     Supine to Sit: Supervision              Transfers:  Sit to Stand: Assist X1;Minimum assistance  Stand to Sit: Assist X1;Minimum assistance  Stand Pivot Transfers: Min assist, patient performs 75-90%                          Balance:  Sitting: Intact  Standing: Intact;With support  Ambulation/Gait Training:  Distance (ft): 100 Feet (ft)  Assistive Device: Walker, rolling;Gait belt  Ambulation - Level of Assistance: Minimal assistance        Gait Abnormalities: Decreased step clearance;Step to gait  Right Side Weight Bearing: As tolerated     Base of Support: Narrowed  Stance: Right decreased  Speed/Cadence: Shuffled;Slow  Step Length: Left shortened;Right shortened                               Stairs:  Neuro Re-Education:    Therapeutic Exercises:   Patient educated on supine quad sets x10 per hr, sitting heel slides x10 per hr.  Pain:  Pain Scale 1: Numeric (0 - 10)  Pain Intensity 1: 2              Activity Tolerance:   Good, noted knee pain following treatment session.  Please refer to the flowsheet for vital signs taken during this treatment.  After treatment:   [x]     Patient left in no apparent distress sitting up in chair  [ ]     Patient left in no apparent distress in bed  [x]     Call bell left within reach  x]    Nursing notified  [X]     Caregiver present, husband  [ ]     Bed alarm activated      COMMUNICATION/COLLABORATION:   The patient???s plan of care was discussed with: Registered Nurse    Rennis Harding,  PT   Time Calculation: 25 mins

## 2012-09-04 NOTE — Progress Notes (Signed)
Home care orders noted. CRM spoke with the patient regarding agency choice. The patient chose [facility]. Referral sent via [employer] and accepted. Will follow as needed. KJT

## 2012-09-04 NOTE — Progress Notes (Signed)
09/04/2012  9:57 AM    Patient status post Procedure(s):  RIGHT TOTAL KNEE ARTHROPLASTY (GEN W/SPINAL) on 09/03/2012 under spinal with duramorph.    Patient doing relatively well. no itching. Pain under good control. no nausea and/or vomiting.    Block site reveals normal exam; no erythema, swelling or tenderness.    Continue current postop pain regimen.

## 2012-09-04 NOTE — Progress Notes (Signed)
Bedside shift change report given to Carolyn C., RN (oncoming nurse) by Heather Schember, RN (offgoing nurse).  Report given with SBAR.

## 2012-09-04 NOTE — Progress Notes (Signed)
Bedside and Verbal shift change report given to Heather,RN (oncoming nurse) by Elizabeth, RN (offgoing nurse).  Report given with SBAR, Kardex, Intake/Output and MAR.

## 2012-09-04 NOTE — Progress Notes (Signed)
Problem: Mobility Impaired (Adult and Pediatric)  Goal: *Acute Goals and Plan of Care (Insert Text)  Physical Therapy Goals  Initiated 09/03/2012    1. Patient will move from supine to sit and sit to supine in bed with modified independence within 4 days.  2. Patient will perform sit to stand with modified independence within 4 days.  3. Patient will ambulate with modified independence for 250 feet with the least restrictive device within 4 days.  4. Patient will ascend/descend 1 stairs with use of handrail(s) with supervision/set-up within 4 days.  5. Patient will perform home exercise program per protocol with independence within 4 days.  6. Patient will demonstrate AROM 0-90 degrees in operative joint within 4 days.  PHYSICAL THERAPY TREATMENT  Patient: Jillian Bean (72 y.o. female)  Date: 09/04/2012  Diagnosis: DJD RIGHT KNEE  DJD RIGHT KNEE  DJD Right knee DJD  Procedure(s) (LRB):  RIGHT TOTAL KNEE ARTHROPLASTY (GEN W/SPINAL) (Right) 1 Day Post-Op  Precautions: Fall;WBAT     ASSESSMENT:  Patient received sitting in chair, awake, had just used restroom with nursing present.  Patient performed seated LAQ, heel slides x10 to 87 deg with overpressure, performed standing marches bearing weight on R leg x4 with RW, performed quad set in supine x10 to 3 deg from full ext with overpressure.  Patient supervision to min A x1 with all transfers and bed mobility.  Patient ambulated 150 ft with RW and CGA, reported her pain has decreased with ambulation from AM treatment session.  Patient appropriate for ortho group class based on these objective findings, would benefit from home health physical therapy consult following DC.  Progression toward goals:  [X]     Improving appropriately and progressing toward goals  [ ]     Improving slowly and progressing toward goals  [ ]     Not making progress toward goals and plan of care will be adjusted       PLAN:  Patient continues to benefit from skilled intervention to address the  above impairments.  Continue treatment per established plan of care.  Discharge Recommendations:  Home Health  Further Equipment Recommendations for Discharge:  Patient owns walker.       SUBJECTIVE:   Patient stated ???I feel a lot better than I did this morning.???      OBJECTIVE DATA SUMMARY:   Critical Behavior:  Neurologic State: Alert  Orientation Level: Oriented X4  Cognition: Appropriate for age attention/concentration  Safety/Judgement: Awareness of environment    ROM:    3-87 degrees R knee    Functional Mobility Training:  Bed Mobility:     Supine to Sit: Supervision  Sit to Supine: Supervision           Transfers:  Sit to Stand: Assist X1;Minimum assistance  Stand to Sit: Assist X1;Minimum assistance  Stand Pivot Transfers:  (CGA x1)                          Balance:  Sitting: Intact  Standing: Intact  Ambulation/Gait Training:  Distance (ft): 150 Feet (ft)  Assistive Device: Walker, rolling;Gait belt  Ambulation - Level of Assistance: CGA        Gait Abnormalities: Decreased step clearance  Right Side Weight Bearing: As tolerated     Base of Support: Narrowed  Stance: Right decreased  Speed/Cadence: Shuffled;Slow  Step Length: Left shortened;Right shortened  Stairs:                       Neuro Re-Education:    Therapeutic Exercises:   Patient performed seated LAQ, heel slides x10 to 87 deg with overpressure, performed standing marches bearing weight on R leg x4 with RW, performed quad set in supine x10 to 3 deg from full ext with overpressure.   Pain:  Pain Scale 1: Numeric (0 - 10)  Pain Intensity 1: 7  Pain Location 1: Knee  Pain Orientation 1: Right  Pain Description 1: Dull     Activity Tolerance:   Good, progressing toward goals.  Please refer to the flowsheet for vital signs taken during this treatment.  After treatment:   [ ]     Patient left in no apparent distress sitting up in chair  [X]     Patient left in no apparent distress in bed  [X]     Call bell left within reach   [X]     Nursing notified  [X]     Caregiver present, husband  [ ]     Bed alarm activated      COMMUNICATION/COLLABORATION:   The patient???s plan of care was discussed with: Registered Nurse    Rennis Harding, PT   Time Calculation: 18 mins

## 2012-09-04 NOTE — Progress Notes (Signed)
Note created on:  09/04/2012      Review summary for follow up contact by Ortho Navigator:                        Follow-up Information    Follow up With Details Comments Contact Info    [facility]   [address]  [place] [address]  [phone]        Admitting Dx:  DJD RIGHT KNEE  DJD RIGHT KNEE  DJD    1 Day Post-Op of   Procedure(s):  RIGHT TOTAL KNEE ARTHROPLASTY (GEN W/SPINAL)   BY: Trenda Moots, MD             ON: 09/03/2012    Admitted:  09/03/2012         Attending Provider:  Trenda Moots, MD       Consultations:  None    PCP:  Clarene Critchley, MD   [phone]    Patient Active Problem List   Diagnosis Code   ??? Hypercholesterolemia 272.0   ??? OA (Osteoarthritis) 715.90   ??? Back Pain 724.5   ??? Plantar Fasciitis 728.71   ??? Hypothyroidism 244.9   ??? Colonic Polyps 211.3   ??? S/P colonoscopy V45.89   ??? DJD (Degenerative Joint Disease) of Knee 715.96   ??? 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 Iron deficiency anemia 280.9   ??? Right knee DJD 715.96         Past Medical History   Diagnosis Date   ??? Plantar fasciitis 09/01/2007   ??? Colonic polyps 08/31/1998   ??? OA (osteoarthritis) 09/01/2007   ??? Back pain 09/01/2007   ??? Hypercholesteremia 09/01/2007   ??? Hypothyroidism 09/01/2003   ??? S/P colonoscopy 10/10/2007   ??? 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 Hiatal hernia 07/11/2011     Dr.sobieski   ??? Lymphocytic colitis 03/27/2012     colonoscopy 6/13 dr Vivien Rossetti   ??? Thyroid disease    ??? Coagulation disorder      IRON DEFFICIENCY ANEMIA   ??? Nausea & vomiting    ??? Other ill-defined conditions      VERTIGO     Prior to Admission Medications   Outpatient Medications Last Dose Informant Patient Reported? Taking?   ASPIR-81 PO   Yes No   Sig: take 81 mg by mouth daily.   CALCIUM CARBONATE, [id number], (TUMS PO)   Yes No   Sig: Take  by mouth daily.   DULoxetine (CYMBALTA) 30 mg capsule   No No   Sig: Take 1 Cap by mouth  daily.   OTHER   Yes No   SYNTHROID 112 mcg tablet   No No   Sig: 9 pills per week   acetaminophen (TYLENOL ARTHRITIS) 650 mg CR tablet   Yes No   Sig: Take 650 mg by mouth every six (6) hours as needed for Pain. Taking every 7-8 hours   cholecalciferol, vitamin d3, (VITAMIN D) 1,000 unit tablet   Yes No   Sig: Take  by mouth daily.   desonide (TRIDESILON) 0.05 % topical lotion Not Taking at Unknown  No No   Sig: Apply  to affected area two (2) times a day.   diclofenac (VOLTAREN) 1 % topical gel   No No   Sig: Apply 2 g to affected area four (4)  times daily.   meclizine (ANTIVERT) 25 mg tablet   No No   Sig: Take 1 Tab by mouth three (3) times daily as needed for Dizziness.   multivitamin (ONE A DAY) tablet   Yes No   Sig: Take 1 Tab by mouth daily.   ondansetron (ZOFRAN ODT) 4 mg disintegrating tablet   No No   Sig: Take 1 Tab by mouth every eight (8) hours as needed for Nausea.   pravastatin (PRAVACHOL) 20 mg tablet   No No   Sig: Take 1 Tab by mouth daily for 90 doses.      Facility-Administered Medications: None       Current Discharge Medication List      CONTINUE these medications which have NOT CHANGED    Details   acetaminophen (TYLENOL ARTHRITIS) 650 mg CR tablet Take 650 mg by mouth every six (6) hours as needed for Pain. Taking every 7-8 hours      OTHER       multivitamin (ONE A DAY) tablet Take 1 Tab by mouth daily.      DULoxetine (CYMBALTA) 30 mg capsule Take 1 Cap by mouth daily.  Qty: 90 Cap, Refills: 1    Associated Diagnoses: Back pain      meclizine (ANTIVERT) 25 mg tablet Take 1 Tab by mouth three (3) times daily as needed for Dizziness.  Qty: 30 Tab, Refills: 1    Associated Diagnoses: Vertigo      ondansetron (ZOFRAN ODT) 4 mg disintegrating tablet Take 1 Tab by mouth every eight (8) hours as needed for Nausea.  Qty: 10 Tab, Refills: 0      diclofenac (VOLTAREN) 1 % topical gel Apply 2 g to affected area four (4) times daily.  Qty: 100 g, Refills: 2    Associated Diagnoses: DJD (degenerative  joint disease)      desonide (TRIDESILON) 0.05 % topical lotion Apply  to affected area two (2) times a day.  Qty: 59 mL, Refills: 2    Associated Diagnoses: Dermatitis      SYNTHROID 112 mcg tablet 9 pills per week  Qty: 100 Tab, Refills: 3    Associated Diagnoses: Hypothyroidism      pravastatin (PRAVACHOL) 20 mg tablet Take 1 Tab by mouth daily for 90 doses.  Qty: 90 Tab, Refills: 3    Associated Diagnoses: Hypercholesteremia      CALCIUM CARBONATE, [id number], (TUMS PO) Take  by mouth daily.      cholecalciferol, vitamin d3, (VITAMIN D) 1,000 unit tablet Take  by mouth daily.      ASPIR-81 PO take 81 mg by mouth daily.              Skin Integrity: Incision (comment)   Wound Knee Right-DRESSING STATUS: Clean, dry, and intact    Wound Knee Right-DRESSING TYPE: 4 x 4

## 2012-09-04 NOTE — Progress Notes (Addendum)
Complaints: none   Events: none      GEN:  NAD. AOx3   ABD:  S/NT/ND   RLE:  Dressing C/D/I    5/5 motor    Calf nttp (Bilat)    Sensate all distribution to light touch    1+ dp/pt pulses, foot perfused      Lab Results   Component Value Date/Time    HGB 7.8 09/04/2012  4:04 AM    INR 1.2 09/04/2012  4:04 AM       Lab Results   Component Value Date/Time    Sodium 140 09/04/2012  4:04 AM    Potassium 3.9 09/04/2012  4:04 AM    Chloride 107 09/04/2012  4:04 AM    CO2 26 09/04/2012  4:04 AM    BUN 6 09/04/2012  4:04 AM    Creatinine 0.52 09/04/2012  4:04 AM    Calcium 8.3 09/04/2012  4:04 AM            POD#1 R TKA. Doing Well    ABX: Complete today  PATHWAY: D/C Foley per protocol  DVT Prophylaxis: Coumadin (adjusted per pharmacy, target 1.7-2.2), SCD, TED   Weight Bearing: WBAT RLE   Pain Control: PRN oral narcotics, celebrex  Anticipated Discharge Date: tomorrow   Disposition: Home, HHPT.

## 2012-09-05 LAB — METABOLIC PANEL, BASIC
Anion gap: 8 mmol/L (ref 5–15)
BUN/Creatinine ratio: 14 (ref 12–20)
BUN: 7 MG/DL (ref 6–20)
CO2: 27 MMOL/L (ref 21–32)
Calcium: 8.5 MG/DL (ref 8.5–10.1)
Chloride: 104 MMOL/L (ref 97–108)
Creatinine: 0.5 MG/DL (ref 0.45–1.15)
GFR est AA: >60 mL/min/{1.73_m2} (ref >60)
GFR est non-AA: >60 mL/min/{1.73_m2} (ref >60)
Glucose: 109 MG/DL — ABNORMAL HIGH (ref 65–100)
Potassium: 3.7 MMOL/L (ref 3.5–5.1)
Sodium: 139 MMOL/L (ref 136–145)

## 2012-09-05 LAB — PROTHROMBIN TIME + INR
INR: 1.5 — ABNORMAL HIGH (ref 0.9–1.1)
Prothrombin time: 15.7 s — ABNORMAL HIGH (ref 9.4–11.7)

## 2012-09-05 LAB — HEMOGLOBIN: HGB: 8.6 g/dL — ABNORMAL LOW (ref 11.5–16.0)

## 2012-09-05 MED ADMIN — cholecalciferol (VITAMIN D3) tablet 1,000 Units: ORAL | @ 15:00:00 | NDC 53191040901

## 2012-09-05 MED ADMIN — HYDROcodone-acetaminophen (NORCO) 10-325 mg tablet 1 Tab: ORAL | @ 02:00:00 | NDC 68084010011

## 2012-09-05 MED ADMIN — sodium chloride (NS) flush 5-10 mL: INTRAVENOUS | @ 12:00:00 | NDC 87701099893

## 2012-09-05 MED ADMIN — HYDROcodone-acetaminophen (NORCO) 10-325 mg tablet 1 Tab: ORAL | @ 15:00:00 | NDC 68084010011

## 2012-09-05 MED ADMIN — HYDROcodone-acetaminophen (NORCO) 10-325 mg tablet 1 Tab: ORAL | @ 12:00:00 | NDC 68084010011

## 2012-09-05 MED ADMIN — warfarin - pharmacy to dose: @ 17:00:00

## 2012-09-05 MED ADMIN — calcium carbonate (TUMS) chewable tablet 200 mg: ORAL | @ 15:00:00 | NDC 66553000401

## 2012-09-05 MED ADMIN — HYDROcodone-acetaminophen (NORCO) 10-325 mg tablet 1 Tab: ORAL | @ 09:00:00 | NDC 68084010011

## 2012-09-05 MED ADMIN — sodium chloride (NS) flush 5-10 mL: INTRAVENOUS | @ 02:00:00 | NDC 87701099893

## 2012-09-05 MED ADMIN — therapeutic multivitamin (THERAGRAN) tablet 1 Tab: ORAL | @ 15:00:00

## 2012-09-05 MED ADMIN — levothyroxine (SYNTHROID) tablet 100 mcg: ORAL | @ 12:00:00 | NDC 00074662411

## 2012-09-05 MED ADMIN — sodium chloride (NS) flush 5-10 mL: INTRAVENOUS | @ 03:00:00 | NDC 87701099893

## 2012-09-05 MED ADMIN — HYDROcodone-acetaminophen (NORCO) 10-325 mg tablet 1 Tab: ORAL | @ 05:00:00 | NDC 68084010011

## 2012-09-05 MED ADMIN — senna-docusate (PERICOLACE) 8.6-50 mg per tablet 1 Tab: ORAL | @ 15:00:00 | NDC 00904564361

## 2012-09-05 MED ADMIN — warfarin (COUMADIN) tablet 3 mg: ORAL | @ 16:00:00 | NDC 00056017001

## 2012-09-05 MED ADMIN — DULoxetine (CYMBALTA) capsule 30 mg: ORAL | @ 15:00:00 | NDC 47335038283

## 2012-09-05 MED FILL — HYDROCODONE-ACETAMINOPHEN 10 MG-325 MG TAB: 10-325 mg | ORAL | Qty: 1

## 2012-09-05 MED FILL — BD POSIFLUSH NORMAL SALINE 0.9 % INJECTION SYRINGE: INTRAMUSCULAR | Qty: 10

## 2012-09-05 MED FILL — CYMBALTA 30 MG CAPSULE,DELAYED RELEASE: 30 mg | ORAL | Qty: 1

## 2012-09-05 MED FILL — SYNTHROID 100 MCG TABLET: 100 mcg | ORAL | Qty: 1

## 2012-09-05 MED FILL — SENNA PLUS 8.6 MG-50 MG TABLET: ORAL | Qty: 1

## 2012-09-05 MED FILL — CALCIUM CARBONATE 200 MG (500 MG) CHEWABLE TAB: 200 mg calcium (500 mg) | ORAL | Qty: 1

## 2012-09-05 MED FILL — VITAMIN D3 25 MCG (1,000 UNIT) TABLET: 25 mcg (1,000 unit) | ORAL | Qty: 1

## 2012-09-05 MED FILL — THERAPEUTIC TABLET: ORAL | Qty: 1

## 2012-09-05 MED FILL — COUMADIN 2 MG TABLET: 2 mg | ORAL | Qty: 1

## 2012-09-05 NOTE — Progress Notes (Signed)
I have reviewed discharge instructions with the patient and spouse.  The patient and spouse verbalized understanding.    The patient and spouse reviewed the computer Coumadin video today.  The [facility]    The patient's belongings were packed by the patient's spouse including her cell phone.

## 2012-09-05 NOTE — Progress Notes (Addendum)
Problem: Mobility Impaired (Adult and Pediatric)  Goal: *Acute Goals and Plan of Care (Insert Text)  Physical Therapy Goals  Initiated 09/03/2012    1. Patient will move from supine to sit and sit to supine in bed with modified independence within 4 days.  2. Patient will perform sit to stand with modified independence within 4 days.  3. Patient will ambulate with modified independence for 250 feet with the least restrictive device within 4 days.  4. Patient will ascend/descend 1 stairs with use of handrail(s) with supervision/set-up within 4 days.  5. Patient will perform home exercise program per protocol with independence within 4 days.  6. Patient will demonstrate AROM 0-90 degrees in operative joint within 4 days.  PHYSICAL THERAPY TREATMENT  Patient: Jillian Bean (71 y.o. female)  Date: 09/05/2012  Diagnosis: DJD RIGHT KNEE  DJD RIGHT KNEE  DJD Right knee DJD  Procedure(s) (LRB):  RIGHT TOTAL KNEE ARTHROPLASTY (GEN W/SPINAL) (Right) 2 Days Post-Op  Precautions: Fall;WBAT  Chart, physical therapy assessment, plan of care and goals were reviewed.      ASSESSMENT:  Pt progressing well. Ambulated 250" with RW with supervision- progressed to a reciprocal gait pattern. Pt performed stair training on 4 stairs and was secure and stable with safe transition of steps. Pt reported pain 3-4/10 during ambulation. Pt received home safety and fall prevention education. Pt performed standing exercises. Pt is cleared for discharge from a PT perspective.  Progression toward goals:  [X]       Improving appropriately and progressing toward goals  [ ]       Improving slowly and progressing toward goals  [ ]       Not making progress toward goals and plan of care will be adjusted       PLAN:  Patient continues to benefit from skilled intervention to address the above impairments.  Continue treatment per established plan of care.  Discharge Recommendations:  Home Health  Further Equipment Recommendations for Discharge:  Has necessary  equipment       SUBJECTIVE:   Patient stated ???I feel comfortable with the steps."      OBJECTIVE DATA SUMMARY:   Critical Behavior:  Neurologic State: Alert  Orientation Level: Oriented X4  Cognition: Appropriate for age attention/concentration  Safety/Judgement: Awareness of environment  Range of Motion:           RLE AROM  R Knee Flexion: 91  R Knee Extension: -20  RLE PROM  R Knee Flexion: 94  R Knee Extension: -6           Functional Mobility Training:  Bed Mobility:     Supine to Sit: Supervision  Sit to Supine: Supervision                       Transfers:  Sit to Stand: CGA  Stand to Sit: Supervision                             Balance:  Sitting: Intact  Standing: Intact;With support  Ambulation/Gait Training:  Distance (ft): 250 Feet (ft)  Assistive Device: Gait belt;Walker, rolling  Ambulation - Level of Assistance: Supervision/Set-up        Gait Abnormalities: Decreased step clearance  Right Side Weight Bearing: As tolerated     Base of Support: Narrowed  Stance: Right decreased  Stairs:  Number of Stairs Trained: 4  Stairs - Level of Assistance: Supervision  Rail Use: Left   Therapeutic Exercises:     EXERCISE   Sets   Reps   Active Active Assist   Passive Self ROM   Comments   Standing heel raises  1 10  [x ]                                [ ]                                 [ ]                                 [ ]                                     Standing marches 1   10  x]                                [ ]                                 [ ]                                 [ ]                                     Standing heel/toe touches 1 10 [x ]  [ ]        Standing hamstring curls 1 10 [x ]  [ ]        Standing mini squats 1   10 [x ]                                [ ]                                 [ ]                                 [ ]                                     Short Arc Quads 1 10 [X]                                 [ ]                                 [ ]                                  [ ]   Knee Extension Stretch 1 1   [ ]                                   [ ]                                   [X]                                   [ ]                                 3 mins on pillow   Heel Slides 1 10 [X]                                 [ ]                                 [ ]                                 [ ]                                 Seated     Pain:  Pain Scale 1: Numeric (0 - 10)  Pain Intensity 1: 3-4  Pain Location 1: Knee  Pain Orientation 1: Right  Pain Description 1: Aching  Pain Intervention(s) 1: Medication (see MAR)ice, repositioning  Activity Tolerance:   No apparent distress Pt tolerated standing exercises.    After treatment:   [X]  Patient left in no apparent distress sitting up in chair with ice  [ ]  Patient left in no apparent distress in bed  [X]  Call bell left within reach  [X]  Nursing notified  [X]  Caregiver present-husband  [ ]  Bed alarm activated      COMMUNICATION/COLLABORATION:   The patient???s plan of care was discussed with: Registered Nurse    Timoteo Expose SPTA   Time Calculation: 40 mins

## 2012-09-05 NOTE — Progress Notes (Signed)
[place]  SBAR Bundled Payment Handoff   ST. [facility]  [facility]  [address]  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 Celebrex (Celecoxib) Other (comments)     Aggitation   ??? Gabapentin Other (comments)     FELT UNSTEADY ON MY FEET   ??? Levaquin (Levofloxacin) Palpitations   ??? Nexium (Esomeprazole Magnesium) Diarrhea   ??? Pravastatin Nausea and Vomiting   ??? Yellow Dye Hives   ??? Zocor (Simvastatin) Other (comments)     Myalgias         Past Medical History   Diagnosis Date   ??? Plantar fasciitis 09/01/2007   ??? Colonic polyps 08/31/1998   ??? OA (osteoarthritis) 09/01/2007   ??? Back pain 09/01/2007   ??? Hypercholesteremia 09/01/2007   ??? Hypothyroidism 09/01/2003   ??? S/P colonoscopy 10/10/2007   ??? 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 Hiatal hernia 07/11/2011     Dr.sobieski   ??? Lymphocytic colitis 03/27/2012     colonoscopy 6/13 dr Vivien Rossetti   ??? Thyroid disease    ??? Coagulation disorder      IRON DEFFICIENCY ANEMIA   ??? Nausea & vomiting    ??? Other ill-defined conditions      VERTIGO       Past Surgical History   Procedure Laterality Date   ???  Hx gyn       Hysterectomy in her 87's   ??? Pr breast surgery procedure unlisted  1993     Breast Reduction   ??? Endoscopy, colon, diagnostic  7/12/18/08     7/05 Dr Vivien Rossetti   ??? Hx other surgical       MINIMALLY INVASIVE LUMBAR DECOMPRESSION   ??? Hx orthopaedic  08/22/09     left TKR   ??? Hx gi       COLONOSCOPY   ??? Hx heent  04/07/10     thyroid biopsy neg dr Suszanne Finch   ??? Hx heent       wisdom teeth extraction       Prior  to Admission Medications   Outpatient Medications Last Dose Informant Patient Reported? Taking?   CALCIUM CARBONATE, [id number], (TUMS PO)   Yes No   Sig: Take  by mouth daily.   DULoxetine (CYMBALTA) 30 mg capsule   No No   Sig: Take 1 Cap by mouth daily.   OTHER   Yes No   SYNTHROID 112 mcg tablet   No No   Sig: 9 pills per week   cholecalciferol, vitamin d3, (VITAMIN D) 1,000 unit tablet   Yes No   Sig: Take  by mouth daily.   desonide (TRIDESILON) 0.05 % topical lotion Not Taking at Unknown  No No   Sig: Apply  to affected area two (2) times a day.   meclizine (ANTIVERT) 25 mg tablet   No No   Sig: Take 1 Tab by mouth three (3) times daily as needed for Dizziness.   multivitamin (ONE A DAY) tablet   Yes No   Sig: Take 1 Tab by mouth daily.   ondansetron (ZOFRAN ODT) 4 mg disintegrating tablet   No No   Sig: Take 1 Tab by mouth every eight (8) hours as needed for Nausea.   pravastatin (PRAVACHOL) 20 mg tablet   No No   Sig: Take 1 Tab by mouth daily for 90 doses.      Facility-Administered Medications: None       Vaccinations:    Immunization History   Administered Date(s) Administered   ??? Pneumococcal Vaccine 11/27/2005   ??? TD Vaccine 08/13/2005         ASSESSMENT   Age: 72 y.o.             Gender: female        Height: Height: 5\' 6"  (167.6 cm)                    Weight:Weight: 65.318 kg (144 lb)     Patient Vitals for the past 8 hrs:   Temp Pulse Resp BP SpO2   09/05/12 0805 98.4 ??F (36.9 ??C) 80 16 121/58 mmHg 94 %        If above not within 1 hour of discharge:    BP:_____  P:____  R:____ T:_____ O2  Sat: ___%  O2: ______    Active Orders   Diet    DIET REGULAR       Orientation: Orientation Level: Oriented X4    Active Lines/Drains:  (Peg Tube / Foley / CL or S/L?):no    Urinary Status: Voiding      Last BM:       Skin Integrity: Incision (comment)   Wound Knee Right-DRESSING STATUS: Clean, dry, and intact    Wound Knee Right-DRESSING TYPE: ABD pad    Mobility: Slightly limited   Weight Bearing Status: WBAT (Weight Bearing as Tolerated)      Gait Training  Assistive Device: Gait belt;Walker, rolling  Ambulation - Level of Assistance: Supervision/Set-up  Distance (ft): 250 Feet (ft)  Stairs - Level of Assistance: Supervision  Number of Stairs Trained: 4  Rail Use: Left      On Anticoagulation? YES  Coumadin  3 mg                                    Last dose:  1/24/2014at 11:30 am    Pain Medications given:  Norco 10 mg  Last dose: 09/05/2012 at  10:30 am    Lab Results   Component Value Date/Time    Glucose 109 09/05/2012  3:53 AM    Hemoglobin A1c 5.2 08/25/2012  1:34 PM    INR 1.5 09/05/2012  3:53 AM    INR 1.2 09/04/2012  4:04 AM    HGB 8.6 09/05/2012  3:53 AM    HGB 7.8 09/04/2012  4:04 AM       Readmission Risks:  Score:  Readmit RISK Tool Score: 5   Known Risks of Readmission: Iron defeciencyanemia, new anticoagulation      RECOMMENDATION     See After Visit Summary (AVS) for:  ?? Discharge instructions  ?? After Hospital Care Plan   ?? Medication Reconciliation     Report given/sent by:  Shela Nevin, RN     For E. Simonne Come, RN               Verbal report given to:  FAXED to:  At [facility]         Estimated discharge time:  09/05/2012 at 1:00 pm       [facility] Orthopaedic Nurse Navigator contact # [phone]

## 2012-09-05 NOTE — Progress Notes (Signed)
Complaints: none   Events: none    GEN:  NAD. AOx3   ABD:  S/NT/ND   RLE:  Dressing C/D/I    5/5 motor    Calf nttp (Bilat)    Sensate all distribution to light touch    1+ dp/pt pulses, foot perfused      Lab Results   Component Value Date/Time    HGB 8.6 09/05/2012  3:53 AM    INR 1.5 09/05/2012  3:53 AM       Lab Results   Component Value Date/Time    Sodium 139 09/05/2012  3:53 AM    Potassium 3.7 09/05/2012  3:53 AM    Chloride 104 09/05/2012  3:53 AM    CO2 27 09/05/2012  3:53 AM    BUN 7 09/05/2012  3:53 AM    Creatinine 0.50 09/05/2012  3:53 AM    Calcium 8.5 09/05/2012  3:53 AM            POD#2 R TKA. Doing Well    ABX: Complete today  PATHWAY: D/C Foley per protocol  DVT Prophylaxis: Coumadin (adjusted per pharmacy, target 1.7-2.2), SCD, TED   Weight Bearing: WBAT RLE   Pain Control: PRN oral narcotics, celebrex  Anticipated Discharge Date: today after PT   Disposition: Home, HHPT.

## 2012-09-05 NOTE — Progress Notes (Signed)
Bedside shift change report given to Lucas Mallow., RN (oncoming nurse) by Lonzo Cloud, RN (offgoing nurse).  Report given with SBAR.

## 2012-09-10 NOTE — Progress Notes (Signed)
HISTORY OF PRESENT ILLNESS  Monisha E Odekirk is a 72 y.o. female.  HPI  Comes in with husband.  She is one week out from right total knee replacement with Dr. Georgina Snell at [facility].  It went terrifically well.  She is really having minimal pain, using her Norco perhaps twice a day.  Not having any constipation, using prunes.  No evidence of infection or significant swelling.  She is walking with her walker.  She is on Coumadin and home health visiting nurses are monitoring levels.  Her hemoglobin was 8.6 on January 24th and she will see hematology in a month.  Her energy is good.  No cardiac or pulmonary symptoms.    MedDATA/mth      Review of Systems   Constitutional: Negative for fever and chills.   Respiratory: Negative for cough and shortness of breath.    Cardiovascular: Negative for chest pain.   Gastrointestinal: Negative for constipation and blood in stool.   Genitourinary: Negative for dysuria, urgency and frequency.   Musculoskeletal: Negative for joint pain and falls.       Physical Exam   Nursing note and vitals reviewed.  Constitutional: She is oriented to person, place, and time. She appears well-developed and well-nourished.   Walking with a walker and able to get on a table   HENT:   Head: Normocephalic and atraumatic.   Neck: Normal range of motion. Neck supple. Carotid bruit is not present. No thyromegaly present.   Cardiovascular: Normal rate, regular rhythm, S1 normal, S2 normal, normal heart sounds and intact distal pulses.    No murmur heard.  Pulmonary/Chest: Effort normal and breath sounds normal. No respiratory distress. She has no wheezes. She has no rales.   Abdominal: Soft. Bowel sounds are normal. She exhibits no distension and no mass. There is no tenderness.   Musculoskeletal: She exhibits no edema.   1 plus edema bilaterally wearing compression hose and dressing on right knee   Neurological: She is alert and oriented to person, place, and time.   Psychiatric: She has a normal mood and  affect. Her behavior is normal.       ASSESSMENT and PLAN  Nazia was seen today for follow-up.    Diagnoses and associated orders for this visit:    DJD (degenerative joint disease) of knee-sp knee replacement and doing great    Iron deficiency anemia-hgb was 8.4 post op and she may need procrit in feb will see hematology   Feeling well    Constipation-minimal cont with stool softeners and prunes

## 2012-09-13 NOTE — Telephone Encounter (Signed)
Post Discharge Follow-up contact after Joint Replacement    Patient discharged on 09/05/12  By  Trenda Moots   following  right knee Arthroplasty.   Spoke with patient today, who reports they are " doing great."  Denies Fever, Shortness of Breath or Chest Pain.  Patient also reports:.    Incision  clean, dry, intact  Calf is non-tender,   operative extremity has moderate swelling.  Pain is well managed.  Discussed use of ice & elevation.  is progressing with therapy and isexercising on own.  Taking Coumadin for anticoagulation, norco for pain,   has resumed PTA medications.  is having regular BMs & urinating without difficulty.  Discussed side effects of anticoagulants & pain medications (bleeding/bruising, constipation, lightheaded/dizziness)  Follow up appointment is scheduled    Discussed calling surgeon  for drainage, bleeding, swelling in operative extremity, fever or pain.  Discussed callingl PCP  with other medical issuesl she has seen PCP since surgery.

## 2012-09-16 NOTE — Discharge Summary (Signed)
@  [email or web address]@ Sondra Barges [facility]   [address]  [place] [address]    DISCHARGE SUMMARY     Patient: Jillian Bean                             Medical Record Number: [id number]                DOB: [birth year]  Age: 72 y.o.  Admit Date: 09/03/2012  Discharge Date: 09/05/2012  Admission Diagnosis: DJD RIGHT KNEE  DJD RIGHT KNEE  DJD  Discharge Diagnosis: DJD RIGHT KNEE  Procedures: Procedure(s):  RIGHT TOTAL KNEE ARTHROPLASTY (GEN W/SPINAL)  Surgeon: Trenda Moots, MD  Assistants:  Celesta Gentile, Montez Hageman.,  PA-C   Anesthesia: spinal  Complications: None     History of Present Illness:  Jillian Bean is a 72 y.o. female with a history of right knee pain, swelling, and marked loss of function.  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    Hospital Course:  Jillian Bean tolerated the procedure well.  She was transferred  to the recovery room in stable condition.  After a brief stay the patient was then transferred to the Joint Replacement Unit at [facility].  On postoperative day #1, the dressing was clean and dry, she was neurovascularly intact. The patient was afebrile and vital signs were stable.  Calves were soft and non-tender bilaterally. On postoperative day  # 2, the patient was tolerating a regular diet and making satisfactory progress with physical therapy.  Hemoglobin and INR prior to discharge were   Lab Results   Component Value Date/Time    HGB 8.6 09/05/2012  3:53 AM    INR 1.5 09/05/2012  3:53 AM   .  Jillian Bean made satisfactory progress with physical therapy and was discharged to Home in stable condition on postoperative day 2.  She was provided with  routine postoperative instructions and advised to follow up in my office in 3 weeks following discharge from the hospital.  She was prescribed Coumadin for DVT prophylaxis and hydrocodone for post-operative pain.    Discharge Medications:  Cannot display discharge medications since this patient is not currently admitted.      Signed by: Karin Golden, MD  09/16/2012

## 2012-11-03 NOTE — Progress Notes (Signed)
HISTORY OF PRESENT ILLNESS  Jillian Bean is a 72 y.o. female.  HPI  Follow up.  Issues:  1. Lymphocytic colitis.  She has been off Pravachol since last June because of this.  We are going to repeat lipids, but have agreed not to start medication for at least another three or four months.  2. Anemia.  Post-op was an 8, most recently up to 11 with a dose of Procrit.  Sees hematology again this week.  3. Arthritis, particularly bothersome in her hand.  She uses Voltaren gel.  Will refer to orthopedics.  4. Hypothyroidism, on Synthroid 112 mcg, nine tablets weekly.  Due for levels.  No symptoms of hypo or hyperthyroidism.    MedDATA/mth      Review of Systems   Constitutional: Negative for fever, chills and weight loss.   Respiratory: Negative for cough, shortness of breath and wheezing.    Cardiovascular: Negative for chest pain, palpitations, orthopnea, leg swelling and PND.   Gastrointestinal: Negative for heartburn, nausea, diarrhea and blood in stool.   Musculoskeletal: Positive for joint pain. Negative for myalgias.   Neurological: Negative for dizziness and headaches.       Physical Exam   Nursing note and vitals reviewed.  Constitutional: She is oriented to person, place, and time. She appears well-developed and well-nourished.   HENT:   Head: Normocephalic and atraumatic.   Neck: Normal range of motion. Neck supple. Carotid bruit is not present. No thyromegaly present.   Cardiovascular: Normal rate, regular rhythm, S1 normal, S2 normal, normal heart sounds and intact distal pulses.    No murmur heard.  Pulmonary/Chest: Effort normal and breath sounds normal. No respiratory distress. She has no wheezes. She has no rales.   Musculoskeletal: She exhibits no edema.   oa changes of first mcp joint on right hand   Neurological: She is alert and oriented to person, place, and time.   Psychiatric: She has a normal mood and affect. Her behavior is normal.       ASSESSMENT and PLAN  Puja was seen today for  follow-up.    Diagnoses and associated orders for this visit:    Hypothyroidism  - SYNTHROID 112 mcg tablet; 9 pills per week  - TSH, 3RD GENERATION    OA (osteoarthritis)-voltaren gel and see ortho    Hypercholesterolemia-off meds due to lymphocytic colitis plan stay off meds another 3-4 mo unless levels are terribly high  - LIPID PANEL  - METABOLIC PANEL, COMPREHENSIVE    Other Orders  - aspirin 81 mg chewable tablet; Take 81 mg by mouth daily.

## 2012-11-03 NOTE — Progress Notes (Signed)
"  REVIEWED RECORD IN PREPARATION FOR VISIT AND HAVE OBTAINED NECESSARY DOCUMENTATION"

## 2012-11-04 LAB — METABOLIC PANEL, COMPREHENSIVE
A-G Ratio: 2.3 (ref 1.1–2.5)
ALT (SGPT): 13 IU/L (ref 0–32)
AST (SGOT): 17 IU/L (ref 0–40)
Albumin: 4.6 g/dL (ref 3.5–4.8)
Alk. phosphatase: 93 IU/L (ref 39–117)
BUN/Creatinine ratio: 16 (ref 11–26)
BUN: 14 mg/dL (ref 8–27)
Bilirubin, total: 0.3 mg/dL (ref 0.0–1.2)
CO2: 26 mmol/L (ref 19–28)
Calcium: 9.2 mg/dL (ref 8.6–10.2)
Chloride: 103 mmol/L (ref 97–108)
Creatinine: 0.88 mg/dL (ref 0.57–1.00)
GFR est AA: 75 mL/min/{1.73_m2} (ref >59)
GFR est non-AA: 65 mL/min/{1.73_m2} (ref >59)
GLOBULIN, TOTAL: 2 g/dL (ref 1.5–4.5)
Glucose: 96 mg/dL (ref 65–99)
Potassium: 4.1 mmol/L (ref 3.5–5.2)
Protein, total: 6.6 g/dL (ref 6.0–8.5)
Sodium: 143 mmol/L (ref 134–144)

## 2012-11-04 LAB — LIPID PANEL
Cholesterol, total: 259 mg/dL — ABNORMAL HIGH (ref 100–199)
HDL Cholesterol: 60 mg/dL (ref >39)
LDL, calculated: 181 mg/dL — ABNORMAL HIGH (ref 0–99)
Triglyceride: 91 mg/dL (ref 0–149)
VLDL, calculated: 18 mg/dL (ref 5–40)

## 2012-11-04 LAB — TSH 3RD GENERATION: TSH: 0.918 u[IU]/mL (ref 0.450–4.500)

## 2012-11-04 NOTE — Progress Notes (Signed)
Quick Note:    Message sent about labs    ______

## 2013-05-06 NOTE — Progress Notes (Signed)
"  REVIEWED RECORD IN PREPARATION FOR VISIT AND HAVE OBTAINED NECESSARY DOCUMENTATION"

## 2013-05-06 NOTE — Progress Notes (Signed)
HISTORY OF PRESENT ILLNESS  Jillian Bean is a 72 y.o. female.  HPI  Jillian Bean is seen at follow up.      Issues:    1. Dyslipidemia.  Had to come off of the statins.  Had a lymphocytic colitis, which was felt to be related to this.  Symptoms all improved off the statin.  We discussed non-statin options for her cholesterol, and may consider Welchol if numbers are elevated.   2. Iron deficiency anemia.  It has been two months since last infusion needed.  Energy is good.   3. Hypothyroidism, on Synthroid nine pills a week, 112 mcg.  No symptoms of hypothyroidism.  Due for labs.    4. She has arthritis in her hands, and would like more of the Voltaren.  She has Advanced Directives that she brings in for the records.        Review of Systems   Constitutional: Negative for fever, chills, weight loss and malaise/fatigue.   Respiratory: Negative for cough, shortness of breath and wheezing.    Cardiovascular: Negative for chest pain, palpitations, orthopnea, leg swelling and PND.   Gastrointestinal: Negative for heartburn, nausea and abdominal pain.   Musculoskeletal: Positive for joint pain. Negative for myalgias.   Neurological: Negative for dizziness and headaches.       Physical Exam   Nursing note and vitals reviewed.  Constitutional: She is oriented to person, place, and time. She appears well-developed and well-nourished.   HENT:   Head: Normocephalic and atraumatic.   Neck: Normal range of motion. Neck supple. Carotid bruit is not present. No thyromegaly present.   Cardiovascular: Normal rate, regular rhythm, S1 normal, S2 normal, normal heart sounds and intact distal pulses.    No murmur heard.  Pulmonary/Chest: Effort normal and breath sounds normal. No respiratory distress. She has no wheezes. She has no rales.   Musculoskeletal: She exhibits no edema.   oa changes of both hands   Neurological: She is alert and oriented to person, place, and time.   Psychiatric: She has a normal mood and affect. Her behavior is  normal.       ASSESSMENT and PLAN  Eleri was seen today for medication evaluation.    Diagnoses and associated orders for this visit:    Hypothyroidism  - SYNTHROID 112 mcg tablet; 9 pills per week  - TSH, 3RD GENERATION    Dermatitis  - desonide (DESOWEN) 0.05 % topical ointment; Apply  to affected area two (2) times a day.    Need for influenza vaccination  - INFLUENZA VIRUS VACCINE, FLUZONE VACC, 3 YRS & >, IM, MEDICARE ONLY    DJD (degenerative joint disease)  - diclofenac (VOLTAREN) 1 % topical gel; Apply 2 g to affected area four (4) times daily for 122 days.    OA (osteoarthritis)    Hypercholesterolemia  - colesevelam (WELCHOL) 625 mg tablet; Take 2 tablets by mouth two (2) times daily (with meals).  - LIPID PANEL  - METABOLIC PANEL, COMPREHENSIVE    Iron deficiency anemia  - CBC WITH AUTOMATED DIFF    Other Orders  - Cancel: desonide (TRIDESILON) 0.05 % topical lotion; Apply  to affected area two (2) times a day.

## 2013-05-07 LAB — METABOLIC PANEL, COMPREHENSIVE
A-G Ratio: 1.9 (ref 1.1–2.5)
ALT (SGPT): 13 IU/L (ref 0–32)
AST (SGOT): 18 IU/L (ref 0–40)
Albumin: 4.3 g/dL (ref 3.5–4.8)
Alk. phosphatase: 90 IU/L (ref 39–117)
BUN/Creatinine ratio: 16 (ref 11–26)
BUN: 13 mg/dL (ref 8–27)
Bilirubin, total: 0.4 mg/dL (ref 0.0–1.2)
CO2: 28 mmol/L (ref 18–29)
Calcium: 9.5 mg/dL (ref 8.6–10.2)
Chloride: 102 mmol/L (ref 97–108)
Creatinine: 0.81 mg/dL (ref 0.57–1.00)
GFR est AA: 83 mL/min/{1.73_m2} (ref >59)
GFR est non-AA: 72 mL/min/{1.73_m2} (ref >59)
GLOBULIN, TOTAL: 2.3 g/dL (ref 1.5–4.5)
Glucose: 91 mg/dL (ref 65–99)
Potassium: 4.5 mmol/L (ref 3.5–5.2)
Protein, total: 6.6 g/dL (ref 6.0–8.5)
Sodium: 144 mmol/L (ref 134–144)

## 2013-05-07 LAB — CBC WITH AUTOMATED DIFF
ABS. BASOPHILS: 0 10*3/uL (ref 0.0–0.2)
ABS. EOSINOPHILS: 0.2 10*3/uL (ref 0.0–0.4)
ABS. IMM. GRANS.: 0 10*3/uL (ref 0.0–0.1)
ABS. MONOCYTES: 0.4 10*3/uL (ref 0.1–0.9)
ABS. NEUTROPHILS: 4 10*3/uL (ref 1.4–7.0)
Abs Lymphocytes: 0.9 10*3/uL (ref 0.7–3.1)
BASOPHILS: 0 %
EOSINOPHILS: 3 %
HCT: 35.2 % (ref 34.0–46.6)
HGB: 11.8 g/dL (ref 11.1–15.9)
IMMATURE GRANULOCYTES: 0 %
Lymphocytes: 17 %
MCH: 29 pg (ref 26.6–33.0)
MCHC: 33.5 g/dL (ref 31.5–35.7)
MCV: 87 fL (ref 79–97)
MONOCYTES: 7 %
NEUTROPHILS: 73 %
PLATELET: 291 10*3/uL (ref 150–379)
RBC: 4.07 x10E6/uL (ref 3.77–5.28)
RDW: 13.2 % (ref 12.3–15.4)
WBC: 5.5 10*3/uL (ref 3.4–10.8)

## 2013-05-07 LAB — TSH 3RD GENERATION: TSH: 0.402 u[IU]/mL — ABNORMAL LOW (ref 0.450–4.500)

## 2013-05-07 LAB — CVD REPORT: PDF IMAGE: 0

## 2013-05-07 LAB — LIPID PANEL
Cholesterol, total: 232 mg/dL — ABNORMAL HIGH (ref 100–199)
HDL Cholesterol: 52 mg/dL (ref >39)
LDL, calculated: 154 mg/dL — ABNORMAL HIGH (ref 0–99)
Triglyceride: 128 mg/dL (ref 0–149)
VLDL, calculated: 26 mg/dL (ref 5–40)

## 2013-05-07 NOTE — Progress Notes (Signed)
Quick Note:    Message sent about labs  Dec from 9 to 8 synthroid per week  ______

## 2013-05-14 NOTE — Progress Notes (Signed)
Patient wrote a letter in regards to her synthroid Rx. Per [facility] Dr.Glynn wrote the prescription for synthroid 112 mcg and signed it for generic substitution permissible or allowed. Per [facility] they advised the patient they would fill it for the brand which will cost the patient 80.00. Patient stated she cancelled the Rx and requested we send the generic form levothyroxine 112 mcg which will save her 70.00 and will only cost the patient 10.00 to fill the generic. Per Dr.Glynn sent the generic form to [facility].

## 2013-05-14 NOTE — Telephone Encounter (Signed)
[facility] requesting  Clarification on the directions for Rx [facility] contact [phone] reference # [id number]

## 2013-05-15 NOTE — Telephone Encounter (Signed)
Clarified directions on Synthroid Rx to pharmacist, pharmacist voiced understanding.

## 2013-06-29 NOTE — Progress Notes (Signed)
HISTORY OF PRESENT ILLNESS  Jillian Bean is a 72 y.o. female.  HPI  Presents with complaints of sore throat that began 5 days ago and became quite severe over the past 2 days; has started with thick post nasal drainage, upper chest congestion and productive cough of green sputum in past 48 hours and has been running low grade fever.  Denies ear pain, SOB, wheezing, chest pain. Jillian Bean husband has been ill with similar symptoms in the past 2 weeks.  Has not taken any OTC medications for current symptoms but has been doing warm saline throat gargles which has helped sore throat.    Review of Systems   Constitutional: Positive for fever and malaise/fatigue. Negative for chills.   HENT: Positive for congestion and sore throat.    Respiratory: Positive for cough and sputum production. Negative for shortness of breath and wheezing.    Cardiovascular: Negative for chest pain and palpitations.   Gastrointestinal: Negative for nausea and vomiting.   Genitourinary: Negative for dysuria and frequency.   Musculoskeletal: Negative for myalgias.   Skin: Negative for rash.   Neurological: Positive for headaches. Negative for dizziness and tingling.       Physical Exam   Nursing note and vitals reviewed.  Constitutional: Jillian Bean is oriented to person, place, and time. Jillian Bean appears well-developed and well-nourished.   HENT:   Head: Normocephalic and atraumatic.   Right Ear: External ear normal.   Left Ear: External ear normal.   Nose: Mucosal edema present. Right sinus exhibits no maxillary sinus tenderness. Left sinus exhibits no maxillary sinus tenderness.   Mouth/Throat: Posterior oropharyngeal erythema present. No oropharyngeal exudate.   Neck: Normal range of motion. Neck supple. No thyromegaly present.   Cardiovascular: Normal rate and regular rhythm.    Pulmonary/Chest: Effort normal. Jillian Bean has no wheezes. Jillian Bean has no rales.   Upper chest congestion   Lymphadenopathy:     Jillian Bean has cervical adenopathy.   Neurological: Jillian Bean is alert and  oriented to person, place, and time.   Skin: Skin is warm and dry.   Psychiatric: Jillian Bean has a normal mood and affect. Jillian Bean behavior is normal.       ASSESSMENT and PLAN  Jillian Bean was seen today for sore throat.    Diagnoses and associated orders for this visit:    Sore throat -- improving    Bronchitis  - azithromycin (ZITHROMAX) 250 mg tablet; Take 1 tablet by mouth See Admin Instructions for 5 days. Take two tablets today then one tablet daily  - guaiFENesin SR (MUCINEX) 600 mg SR tablet; Take 1 tablet by mouth two (2) times a day.    Follow up if signs and symptoms worsen or change. After hours number given.     reviewed diet, exercise and weight control  reviewed medications and side effects in detail

## 2013-06-29 NOTE — Patient Instructions (Addendum)
Bronchitis: After Your Visit  Your Care Instructions     Bronchitis is inflammation of the bronchial tubes, which carry air to the lungs. The tubes swell and produce mucus, or phlegm. The mucus and inflamed bronchial tubes make you cough. You may have trouble breathing.  Most cases of bronchitis are caused by viruses like those that cause colds. Antibiotics usually do not help and they may be harmful.  Bronchitis usually develops rapidly and lasts about 2 to 3 weeks in otherwise healthy people.  Follow-up care is a key part of your treatment and safety. Be sure to make and go to all appointments, and call your doctor if you are having problems. It's also a good idea to know your test results and keep a list of the medicines you take.  How can you care for yourself at home?  ?? Take all medicines exactly as prescribed. Call your doctor if you think you are having a problem with your medicine.  ?? Get some extra rest.  ?? Take an over-the-counter pain medicine, such as acetaminophen (Tylenol), ibuprofen (Advil, Motrin), or naproxen (Aleve) to reduce fever and relieve body aches. Read and follow all instructions on the label.  ?? Do not take two or more pain medicines at the same time unless the doctor told you to. Many pain medicines have acetaminophen, which is Tylenol. Too much acetaminophen (Tylenol) can be harmful.  ?? Take an over-the-counter cough medicine that contains dextromethorphan to help quiet a dry, hacking cough so that you can sleep. Avoid cough medicines that have more than one active ingredient. Read and follow all instructions on the label.  ?? Breathe moist air from a humidifier, hot shower, or sink filled with hot water. The heat and moisture will thin mucus so you can cough it out.  ?? Do not smoke. Smoking can make bronchitis worse. If you need help quitting, talk to your doctor about stop-smoking programs and medicines. These can increase your chances of quitting for good.  When should you call for  help?  Call 911 anytime you think you may need emergency care. For example, call if:  ?? You have severe trouble breathing.  Call your doctor now or seek immediate medical care if:  ?? You have new or worse trouble breathing.  ?? You cough up dark brown or bloody mucus (sputum).  ?? You have a new or higher fever.  ?? You have a new rash.  Watch closely for changes in your health, and be sure to contact your doctor if:  ?? You cough more deeply or more often, especially if you notice more mucus or a change in the color of your mucus.  ?? You are not getting better as expected.   Where can you learn more?   Go to http://www.healthwise.net/[facility]  Enter H333 in the search box to learn more about "Bronchitis: After Your Visit."   ?? 2006-2014 Healthwise, Incorporated. Care instructions adapted under license by [employer] (which disclaims liability or warranty for this information). This care instruction is for use with your licensed healthcare professional. If you have questions about a medical condition or this instruction, always ask your healthcare professional. Healthwise, Incorporated disclaims any warranty or liability for your use of this information.  Content Version: 10.2.346038; Current as of: April 11, 2011              Sore Throat: After Your Visit  Your Care Instructions     Infection by bacteria or a virus causes   most sore throats. Cigarette smoke, dry air, air pollution, allergies, and yelling can also cause a sore throat. Sore throats can be painful and annoying. Fortunately, most sore throats go away on their own. If you have a bacterial infection, your doctor may prescribe antibiotics.  Follow-up care is a key part of your treatment and safety. Be sure to make and go to all appointments, and call your doctor if you are having problems. It's also a good idea to know your test results and keep a list of the medicines you take.  How can you care for yourself at home?  ?? If your doctor prescribed  antibiotics, take them as directed. Do not stop taking them just because you feel better. You need to take the full course of antibiotics.  ?? Gargle with warm salt water once an hour to help reduce swelling and relieve discomfort. Use 1 teaspoon of salt mixed in 1 cup of warm water.  ?? Take an over-the-counter pain medicine, such as acetaminophen (Tylenol), ibuprofen (Advil, Motrin), or naproxen (Aleve). Read and follow all instructions on the label.  ?? Be careful when taking over-the-counter cold or flu medicines and Tylenol at the same time. Many of these medicines have acetaminophen, which is Tylenol. Read the labels to make sure that you are not taking more than the recommended dose. Too much acetaminophen (Tylenol) can be harmful.  ?? Drink plenty of fluids. Fluids may help soothe an irritated throat. Hot fluids, such as tea or soup, may help decrease throat pain.  ?? Use over-the-counter throat lozenges to soothe pain. Regular cough drops or hard candy may also help. These should not be given to young children because of the risk of choking.  ?? Do not smoke or allow others to smoke around you. If you need help quitting, talk to your doctor about stop-smoking programs and medicines. These can increase your chances of quitting for good.  ?? Use a vaporizer or humidifier to add moisture to your bedroom. Follow the directions for cleaning the machine.  When should you call for help?  Call your doctor now or seek immediate medical care if:  ?? You have new or worse trouble swallowing.  ?? Your sore throat gets much worse on one side.  Watch closely for changes in your health, and be sure to contact your doctor if you do not get better as expected.   Where can you learn more?   Go to [email or web address]  Enter U420 in the search box to learn more about "Sore Throat: After Your Visit."   ?? 2006-2014 Healthwise, Incorporated. Care instructions adapted under license by [employer] (which disclaims liability  or warranty for this information). This care instruction is for use with your licensed healthcare professional. If you have questions about a medical condition or this instruction, always ask your healthcare professional. Healthwise, Incorporated disclaims any warranty or liability for your use of this information.  Content Version: 10.2.346038; Current as of: Dec 19, 2011

## 2013-07-30 NOTE — Progress Notes (Signed)
HISTORY OF PRESENT ILLNESS  Jillian Bean is a 72 y.o. female.  HPI  Follow up.      Issues:  1. Hypothyroidism.  She was slightly over-replaced at last check, so we dropped from 112 mcg, nine pills a week to eight a week.  Due for levels.  Feels well.  Energy is good.  2. Dyslipidemia.  Had lymphocytic colitis with statins, so we are using Welchol, two b.i.d.  A little gas with this, but tolerable.  3. Previous iron deficiency anemia.  Hemoglobin is now up into the 12 range and she feels great.      Review of Systems   Constitutional: Negative for fever, chills and weight loss.   Respiratory: Negative for cough, shortness of breath and wheezing.    Cardiovascular: Negative for chest pain, palpitations, orthopnea, leg swelling and PND.   Gastrointestinal: Negative for heartburn, nausea and abdominal pain.   Musculoskeletal: Negative for myalgias.   Neurological: Negative for dizziness and headaches.       Physical Exam   Nursing note and vitals reviewed.  Constitutional: She is oriented to person, place, and time. She appears well-developed and well-nourished.   HENT:   Head: Normocephalic and atraumatic.   Neck: Normal range of motion. Neck supple. Carotid bruit is not present. No thyromegaly present.   Cardiovascular: Normal rate, regular rhythm, S1 normal, S2 normal, normal heart sounds and intact distal pulses.    No murmur heard.  Pulmonary/Chest: Effort normal and breath sounds normal. No respiratory distress. She has no wheezes. She has no rales.   Musculoskeletal: She exhibits no edema.   Neurological: She is alert and oriented to person, place, and time.   Psychiatric: She has a normal mood and affect. Her behavior is normal.       ASSESSMENT and PLAN  [name] was seen today for medication evaluation.    Diagnoses and associated orders for this visit:    Hypothyroidism  - levothyroxine (SYNTHROID) 112 mcg tablet; 8 per week  - TSH, 3RD GENERATION    Hypercholesteremia-cont welchol 4 per day  - LIPID PANEL   - METABOLIC PANEL, COMPREHENSIVE

## 2013-07-30 NOTE — Progress Notes (Signed)
"  REVIEWED RECORD IN PREPARATION FOR VISIT AND HAVE OBTAINED NECESSARY DOCUMENTATION"

## 2013-07-31 LAB — METABOLIC PANEL, COMPREHENSIVE
A-G Ratio: 2.3 (ref 1.1–2.5)
ALT (SGPT): 14 IU/L (ref 0–32)
AST (SGOT): 21 IU/L (ref 0–40)
Albumin: 4.6 g/dL (ref 3.5–4.8)
Alk. phosphatase: 89 IU/L (ref 39–117)
BUN/Creatinine ratio: 18 (ref 11–26)
BUN: 15 mg/dL (ref 8–27)
Bilirubin, total: 0.3 mg/dL (ref 0.0–1.2)
CO2: 26 mmol/L (ref 18–29)
Calcium: 9.5 mg/dL (ref 8.6–10.2)
Chloride: 102 mmol/L (ref 97–108)
Creatinine: 0.82 mg/dL (ref 0.57–1.00)
GFR est AA: 82 mL/min/{1.73_m2} (ref >59)
GFR est non-AA: 71 mL/min/{1.73_m2} (ref >59)
GLOBULIN, TOTAL: 2 g/dL (ref 1.5–4.5)
Glucose: 91 mg/dL (ref 65–99)
Potassium: 4.4 mmol/L (ref 3.5–5.2)
Protein, total: 6.6 g/dL (ref 6.0–8.5)
Sodium: 144 mmol/L (ref 134–144)

## 2013-07-31 LAB — CVD REPORT: PDF IMAGE: 0

## 2013-07-31 LAB — TSH 3RD GENERATION: TSH: 4.28 u[IU]/mL (ref 0.450–4.500)

## 2013-07-31 LAB — LIPID PANEL
Cholesterol, total: 214 mg/dL — ABNORMAL HIGH (ref 100–199)
HDL Cholesterol: 53 mg/dL (ref >39)
LDL, calculated: 136 mg/dL — ABNORMAL HIGH (ref 0–99)
Triglyceride: 124 mg/dL (ref 0–149)
VLDL, calculated: 25 mg/dL (ref 5–40)

## 2013-08-02 NOTE — Progress Notes (Signed)
Quick Note:    Message sent about labs    ______

## 2013-11-19 ENCOUNTER — Encounter

## 2013-11-19 MED ORDER — COLESEVELAM 625 MG TAB
625 mg | ORAL_TABLET | Freq: Two times a day (BID) | ORAL | Status: DC
Start: 2013-11-19 — End: 2014-05-17

## 2014-02-10 MED ORDER — DICLOFENAC 1 % TOPICAL GEL
1 % | Freq: Four times a day (QID) | CUTANEOUS | Status: AC
Start: 2014-02-10 — End: 2014-06-12

## 2014-02-10 NOTE — Progress Notes (Signed)
"  REVIEWED RECORD IN PREPARATION FOR VISIT AND HAVE OBTAINED NECESSARY DOCUMENTATION"

## 2014-02-10 NOTE — Progress Notes (Signed)
Patient tolerated tdap injection in left deltoid-IM. No bruising or bleeding present. Patient aware of proper pain management.

## 2014-02-11 NOTE — Progress Notes (Signed)
This is a Subsequent Medicare Annual Wellness Visit providing Personalized Prevention Plan Services (PPPS) (Performed 12 months after initial AWV and PPPS )    I have reviewed the patient's medical history in detail and updated the computerized patient record.     History     Past Medical History   Diagnosis Date   ??? Plantar fasciitis 09/01/2007   ??? Colonic polyps 08/31/1998   ??? OA (osteoarthritis) 09/01/2007   ??? Back pain 09/01/2007   ??? Hypercholesteremia 09/01/2007   ??? Hypothyroidism 09/01/2003   ??? S/P colonoscopy 10/10/2007   ??? 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 Hiatal hernia 07/11/2011     Dr.sobieski   ??? 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 Thyroid disease    ??? Coagulation disorder ([place])      IRON DEFFICIENCY ANEMIA   ??? Nausea & vomiting    ??? Other ill-defined conditions(799.89)      VERTIGO      Past Surgical History   Procedure Laterality Date   ??? Hx gyn       Hysterectomy in her 75's   ??? Pr breast surgery procedure unlisted  1993     Breast Reduction   ??? Endoscopy, colon, diagnostic  7/12/18/08     7/05 Dr Maudry Diego   ??? Hx other surgical       MINIMALLY INVASIVE LUMBAR DECOMPRESSION   ??? Hx gi       COLONOSCOPY   ??? Hx heent  04/07/10     thyroid biopsy neg dr Leward Quan   ??? Hx heent       wisdom teeth extraction   ??? Hx orthopaedic  08/22/09     left TKR   ??? Hx orthopaedic  08/13/12     right tkr     Current Outpatient Prescriptions   Medication Sig Dispense Refill   ??? diclofenac (VOLTAREN) 1 % topical gel Apply 2 g to affected area four (4) times daily for 122 days. 100 g 2   ??? colesevelam (WELCHOL) 625 mg tablet Take 2 Tabs by mouth two (2) times daily (with meals). 360 Tab 0   ??? levothyroxine (SYNTHROID) 112 mcg tablet 8 per week (Patient taking differently: 112 mcg. 8 per week) 100 tablet 3   ??? aspirin 81 mg chewable tablet Take 81 mg by mouth daily.     ??? OTHER       ??? multivitamin (ONE A DAY) tablet Take 1 Tab by mouth daily.     ??? CALCIUM CARBONATE, ALP3790, (TUMS PO) Take  by mouth daily.     ??? cholecalciferol, vitamin d3, (VITAMIN D) 1,000 unit tablet Take  by mouth daily.     ??? VOLTAREN 1 % topical gel      ??? desonide (DESOWEN) 0.05 % topical ointment Apply  to affected area two (2) times a day. 15 g 0     Allergies   Allergen Reactions   ??? Celebrex [Celecoxib] Other (comments)     Aggitation   ??? Gabapentin Other (comments)     FELT UNSTEADY ON MY FEET   ??? Levaquin [Levofloxacin] Palpitations   ??? Nexium [Esomeprazole Magnesium] Diarrhea   ??? Oxycontin [Oxycodone] Itching   ??? Pravastatin Nausea and Vomiting   ??? Yellow Dye Hives   ??? Zocor [Simvastatin] Other (comments)     Myalgias       Family History  Problem Relation Age of Onset   ??? Cancer Mother      Breast   ??? Heart Failure Father      MI   ??? Seizures Brother      ms     History   Substance Use Topics   ??? Smoking status: Never Smoker    ??? Smokeless tobacco: Never Used   ??? Alcohol Use: No     Patient Active Problem List   Diagnosis Code   ??? Hypercholesterolemia 272.0   ??? OA (Osteoarthritis) 715.90   ??? Back Pain 724.5   ??? Plantar Fasciitis 728.71   ??? Colonic Polyps 211.3   ??? S/P colonoscopy V45.89   ??? DJD (Degenerative Joint Disease) of Knee 715.96   ??? 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 Iron deficiency anemia 280.9   ??? Right knee DJD 715.96   ??? Unspecified hypothyroidism 244.9       Depression Risk Factor Screening:     PHQ 2 / 9, over the last two weeks 02/10/2014   Little interest or pleasure in doing things Not at all   Feeling down, depressed or hopeless Not at all   Total Score PHQ 2 0     Alcohol Risk Factor Screening:   On any occasion during the past 3 months, have you had more than 3 drinks containing alcohol?  No    Do you average more than 7 drinks per week?  No      Functional Ability and Level of Safety:     Hearing Loss   mild    Activities of Daily Living    Self-care.   Requires assistance with: no ADLs    Fall Risk     Fall Risk Assessment, last 12 mths 02/10/2014   Able to walk? Yes   Fall in past 12 months? No     Abuse Screen   Patient is not abused    Review of Systems   A comprehensive review of systems was negative except for: arthritis of thumbs sp steroid shot dr Vikki Ports helps, recent hgb 13 and released by hematology , energy is good, some inc gas from welchol 4 per day and wants to dec    Physical Examination     Evaluation of Cognitive Function:  Mood/affect:  happy  Appearance: age appropriate  Family member/caregiver input: none    BP 138/63 mmHg   Pulse 67   Temp(Src) 98.5 ??F (36.9 ??C) (Oral)   Resp 16   Ht 5\' 6"  (1.676 m)   Wt 151 lb (68.493 kg)   BMI 24.38 kg/m2   SpO2 96%   LMP 04/13/2010  General:  Alert, cooperative, no distress, appears stated age.   Head:  Normocephalic, without obvious abnormality, atraumatic.   Eyes:  Conjunctivae/corneas clear. PERRL, EOMs intact. Fundi benign.   Ears:  Normal TMs and external ear canals both ears.   Nose: Nares normal. Septum midline. Mucosa normal. No drainage or sinus tenderness.   Throat: Lips, mucosa, and tongue normal. Teeth and gums normal.   Neck: Supple, symmetrical, trachea midline, no adenopathy, thyroid: no enlargement/tenderness/nodules, no carotid bruit and no JVD.   Back:   Symmetric, no curvature. ROM normal. No CVA tenderness.   Lungs:   Clear to auscultation bilaterally.   Chest wall:  No tenderness or deformity.   Heart:  Regular rate and rhythm, S1, S2 normal, no murmur, click, rub or gallop.   Breast Exam:  No tenderness, masses, or nipple abnormality.   Abdomen:   Soft, non-tender. Bowel sounds normal. No masses,  No organomegaly.           Extremities: Extremities normal, atraumatic, no cyanosis or edema. oa changes of hands bilat   Pulses: 2+ and symmetric all extremities.   Skin: Skin color, texture, turgor normal. No rashes or lesions.    Lymph nodes: Cervical, supraclavicular, and axillary nodes normal.   Neurologic: CNII-XII intact. Normal strength, sensation and reflexes throughout.       Patient Care Team:  [name], MD as PCP - General    Advice/Referrals/Counseling   Education and counseling provided:  Pneumococcal Vaccine  Influenza Vaccine  Screening Mammography  Screening Pap and pelvic (covered once every 2 years)  Colorectal cancer screening tests  Bone mass measurement (DEXA)  Screening for glaucoma    Assessment/Plan   Monigue was seen today for well woman.    Diagnoses and associated orders for this visit:    Medicare annual wellness visit, subsequent  Colonoscopy done in 2013, mammo in 3/15  dexa in 10/12 osteopenia needs follow up  Pap 9/12 normal and no sxs  - URINALYSIS W/ RFLX MICROSCOPIC    Need for diphtheria-tetanus-pertussis (Tdap) vaccine  - TETANUS, DIPHTHERIA TOXOIDS AND ACELLULAR PERTUSSIS VACCINE (TDAP), IN INDIVIDS. >=7, IM    Osteopenia  - DEXA BONE DENSITY STUDY AXIAL; Future  - CBC WITH AUTOMATED DIFF  - METABOLIC PANEL, COMPREHENSIVE  - VITAMIN D, 25-HYDROXY, TOTAL    Unspecified hypothyroidism  - LIPID PANEL  - TSH, 3RD GENERATION    Osteoarthritis, unspecified osteoarthritis type, unspecified site  - diclofenac (VOLTAREN) 1 % topical gel; Apply 2 g to affected area four (4) times daily for 122 days.  Cont with dr Vikki Ports    .dec welchol to 2 per day given gi sxs and appt in 4 mo

## 2014-02-16 LAB — CBC WITH AUTOMATED DIFF
ABS. BASOPHILS: 0 10*3/uL (ref 0.0–0.2)
ABS. EOSINOPHILS: 0.2 10*3/uL (ref 0.0–0.4)
ABS. IMM. GRANS.: 0 10*3/uL (ref 0.0–0.1)
ABS. MONOCYTES: 0.5 10*3/uL (ref 0.1–0.9)
ABS. NEUTROPHILS: 4.3 10*3/uL (ref 1.4–7.0)
Abs Lymphocytes: 1.5 10*3/uL (ref 0.7–3.1)
BASOPHILS: 0 %
EOSINOPHILS: 2 %
HCT: 35.4 % (ref 34.0–46.6)
HGB: 11.8 g/dL (ref 11.1–15.9)
IMMATURE GRANULOCYTES: 0 %
Lymphocytes: 24 %
MCH: 28.8 pg (ref 26.6–33.0)
MCHC: 33.3 g/dL (ref 31.5–35.7)
MCV: 86 fL (ref 79–97)
MONOCYTES: 7 %
NEUTROPHILS: 67 %
PLATELET: 339 10*3/uL (ref 150–379)
RBC: 4.1 x10E6/uL (ref 3.77–5.28)
RDW: 13.2 % (ref 12.3–15.4)
WBC: 6.5 10*3/uL (ref 3.4–10.8)

## 2014-02-16 LAB — URINALYSIS W/ RFLX MICROSCOPIC
Bilirubin: NEGATIVE
Blood: NEGATIVE
Glucose: NEGATIVE
Ketone: NEGATIVE
Leukocyte Esterase: NEGATIVE
Nitrites: NEGATIVE
Protein: NEGATIVE
Specific Gravity: 1.026 (ref 1.005–1.030)
Urobilinogen: 0.2 mg/dL (ref 0.0–1.9)
pH (UA): 6 (ref 5.0–7.5)

## 2014-02-16 LAB — METABOLIC PANEL, COMPREHENSIVE
A-G Ratio: 2.1 (ref 1.1–2.5)
ALT (SGPT): 11 IU/L (ref 0–32)
AST (SGOT): 16 IU/L (ref 0–40)
Albumin: 4.4 g/dL (ref 3.5–4.8)
Alk. phosphatase: 89 IU/L (ref 39–117)
BUN/Creatinine ratio: 19 (ref 11–26)
BUN: 15 mg/dL (ref 8–27)
Bilirubin, total: 0.3 mg/dL (ref 0.0–1.2)
CO2: 27 mmol/L (ref 18–29)
Calcium: 9.3 mg/dL (ref 8.7–10.3)
Chloride: 101 mmol/L (ref 97–108)
Creatinine: 0.78 mg/dL (ref 0.57–1.00)
GFR est AA: 87 mL/min/{1.73_m2} (ref >59)
GFR est non-AA: 75 mL/min/{1.73_m2} (ref >59)
GLOBULIN, TOTAL: 2.1 g/dL (ref 1.5–4.5)
Glucose: 86 mg/dL (ref 65–99)
Potassium: 4.3 mmol/L (ref 3.5–5.2)
Protein, total: 6.5 g/dL (ref 6.0–8.5)
Sodium: 141 mmol/L (ref 134–144)

## 2014-02-16 LAB — LIPID PANEL
Cholesterol, total: 209 mg/dL — ABNORMAL HIGH (ref 100–199)
HDL Cholesterol: 57 mg/dL (ref >39)
LDL, calculated: 134 mg/dL — ABNORMAL HIGH (ref 0–99)
Triglyceride: 89 mg/dL (ref 0–149)
VLDL, calculated: 18 mg/dL (ref 5–40)

## 2014-02-16 LAB — CVD REPORT

## 2014-02-16 LAB — VITAMIN D, 25-HYDROXY, TOTAL: Vitamin D 25-Hydroxy: 39 ng/mL

## 2014-02-16 LAB — TSH 3RD GENERATION: TSH: 3.22 u[IU]/mL (ref 0.450–4.500)

## 2014-02-16 NOTE — Progress Notes (Signed)
Quick Note:        Message sent about labs        ______

## 2014-03-28 NOTE — Progress Notes (Signed)
Quick Note:        Message sent about labs    appt in next month    ______

## 2014-04-22 MED ORDER — ALENDRONATE 70 MG TAB
70 mg | ORAL_TABLET | ORAL | Status: DC
Start: 2014-04-22 — End: 2014-07-22

## 2014-04-22 MED ORDER — MELOXICAM 7.5 MG TAB
7.5 mg | ORAL_TABLET | Freq: Every day | ORAL | Status: DC
Start: 2014-04-22 — End: 2014-05-19

## 2014-04-22 NOTE — Progress Notes (Signed)
HISTORY OF PRESENT ILLNESS  Jillian Bean is a 73 y.o. female.  HPI  Jillian Bean comes in to discuss a few issues:  1. Bone density most recently done, T score in hip went from -2.4 in 2012 to -3, in spine from -1.3 to -.9.  She has a good vitamin D level and exercises six to seven days a week.  We are going to screen for hypoparathyroidism, but I have also discussed bisphosphonates risks, including osteonecrosis of jaw and esophagitis.  Will start Fosamax.  2. Back pain with stiffness and arthritis, some radiation to buttocks.  Trouble with Celebrex in the past, caused agitation.  Will try Mobic.  Also has arthritis in her hand and has seen Dr. Vikki Ports, thinking about surgery for this.      Review of Systems   Gastrointestinal: Negative for heartburn, nausea and vomiting.   Musculoskeletal: Positive for back pain and falls.       Physical Exam   Constitutional: She is oriented to person, place, and time. She appears well-developed and well-nourished.   Musculoskeletal:   oa changes of hands bilat   Neurological: She is alert and oriented to person, place, and time.   Psychiatric: She has a normal mood and affect. Her behavior is normal. Judgment and thought content normal.   Nursing note and vitals reviewed.      ASSESSMENT and PLAN  Marcha was seen today for medication refill and labs.    Diagnoses and associated orders for this visit:    Osteoporosis-risks and benefits discussed as above  - alendronate (FOSAMAX) 70 mg tablet; Take 1 Tab by mouth Every Thursday.  - PTH INTACT    Osteoarthritis, unspecified osteoarthritis type, unspecified site  - meloxicam (MOBIC) 7.5 mg tablet; Take 1 Tab by mouth daily.    Over 50% of the 15 minutes face to face with Duane Lope consisted of counseling and/or discussing treatment plans in reference to her meds and side effects possible  Start mobic first then fosamax and dosing reviewed  appt in 67mo.

## 2014-04-22 NOTE — Progress Notes (Signed)
Have you been to the ER or urgent care clinic since your last visit?    No     Have you been hospitalized since your last visit?    No     Have you been seen or consulted any other health care provider outside of [facility] since your last visit (including pap smears, colonoscopy screening)?    No

## 2014-04-23 LAB — PTH INTACT: PTH, Intact: 21 pg/mL (ref 15–65)

## 2014-04-24 NOTE — Progress Notes (Signed)
Quick Note:        Message sent about labs        ______

## 2014-05-17 MED ORDER — WELCHOL 625 MG TABLET
625 mg | ORAL_TABLET | ORAL | Status: DC
Start: 2014-05-17 — End: 2014-07-22

## 2014-05-19 MED ORDER — MELOXICAM 7.5 MG TAB
7.5 mg | ORAL_TABLET | ORAL | Status: DC
Start: 2014-05-19 — End: 2014-12-06

## 2014-07-22 ENCOUNTER — Ambulatory Visit: Admit: 2014-07-22 | Discharge: 2014-07-22 | Payer: MEDICARE | Attending: Internal Medicine | Primary: Internal Medicine

## 2014-07-22 ENCOUNTER — Inpatient Hospital Stay: Admit: 2014-11-09 | Payer: MEDICARE | Primary: Internal Medicine

## 2014-07-22 DIAGNOSIS — M810 Age-related osteoporosis without current pathological fracture: Secondary | ICD-10-CM

## 2014-07-22 DIAGNOSIS — E780 Pure hypercholesterolemia: Secondary | ICD-10-CM

## 2014-07-22 MED ORDER — LEVOTHYROXINE 112 MCG TAB
112 mcg | ORAL_TABLET | ORAL | Status: DC
Start: 2014-07-22 — End: 2015-07-19

## 2014-07-22 MED ORDER — ALENDRONATE 70 MG TAB
70 mg | ORAL_TABLET | ORAL | Status: DC
Start: 2014-07-22 — End: 2015-06-21

## 2014-07-22 MED ORDER — COLESEVELAM 625 MG TAB
625 mg | ORAL_TABLET | ORAL | Status: DC
Start: 2014-07-22 — End: 2015-07-19

## 2014-07-22 NOTE — Progress Notes (Signed)
HISTORY OF PRESENT ILLNESS  Jillian Bean is a 73 y.o. female.  HPI  Follow up on dyslipidemia.  She could not tolerate four Welchol because of gas, but she is doing two a day.  Will check labs.  She has had no hypothyroid symptoms and TSH looked good in the summer.  She is taking eight tablets weekly.  She has been able to tolerate the Alendronate.  No esophagitis symptoms and aware of dosing on an empty stomach.  Energy has been good, last hemoglobin was 11.  Does have history of iron deficiency anemia.  No sign of bleeding.      Review of Systems   Constitutional: Negative for fever, chills and weight loss.   Respiratory: Negative for cough, shortness of breath and wheezing.    Cardiovascular: Negative for chest pain, palpitations, orthopnea, leg swelling and PND.   Gastrointestinal: Negative for heartburn, nausea, abdominal pain, blood in stool and melena.   Musculoskeletal: Negative for myalgias.   Neurological: Negative for dizziness and headaches.       Physical Exam   Constitutional: She is oriented to person, place, and time. She appears well-developed and well-nourished.   HENT:   Head: Normocephalic and atraumatic.   Neck: Normal range of motion. Neck supple. Carotid bruit is not present. No thyromegaly present.   Cardiovascular: Normal rate, regular rhythm, S1 normal, S2 normal, normal heart sounds and intact distal pulses.    No murmur heard.  Pulmonary/Chest: Effort normal and breath sounds normal. No respiratory distress. She has no wheezes. She has no rales.   Musculoskeletal: She exhibits no edema.   Neurological: She is alert and oriented to person, place, and time.   Psychiatric: She has a normal mood and affect. Her behavior is normal.   Nursing note and vitals reviewed.      ASSESSMENT and PLAN  Jillian Bean was seen today for follow-up.    Diagnoses and all orders for this visit:    Osteoporosis-tolerating med cont , consider dexa in 7/16  Orders:   -     alendronate (FOSAMAX) 70 mg tablet; Take 1 Tab by mouth Every Thursday.    Hypercholesterolemia  Orders:  -     colesevelam (WELCHOL) 625 mg tablet; 1 po bid  -     METABOLIC PANEL, COMPREHENSIVE  -     LIPID PANEL    Iron deficiency anemia  Orders:  -     CBC WITH AUTOMATED DIFF    Hypothyroidism due to acquired atrophy of thyroid  Orders:  -     levothyroxine (SYNTHROID) 112 mcg tablet; 8 per week

## 2014-07-23 LAB — METABOLIC PANEL, COMPREHENSIVE
A-G Ratio: 1.9 (ref 1.1–2.5)
ALT (SGPT): 18 IU/L (ref 0–32)
AST (SGOT): 18 IU/L (ref 0–40)
Albumin: 4.5 g/dL (ref 3.5–4.8)
Alk. phosphatase: 86 IU/L (ref 39–117)
BUN/Creatinine ratio: 23 (ref 11–26)
BUN: 16 mg/dL (ref 8–27)
Bilirubin, total: 0.3 mg/dL (ref 0.0–1.2)
CO2: 24 mmol/L (ref 18–29)
Calcium: 9 mg/dL (ref 8.7–10.3)
Chloride: 105 mmol/L (ref 97–108)
Creatinine: 0.7 mg/dL (ref 0.57–1.00)
GFR est AA: 99 mL/min/{1.73_m2} (ref >59)
GFR est non-AA: 86 mL/min/{1.73_m2} (ref >59)
GLOBULIN, TOTAL: 2.4 g/dL (ref 1.5–4.5)
Glucose: 88 mg/dL (ref 65–99)
Potassium: 4.4 mmol/L (ref 3.5–5.2)
Protein, total: 6.9 g/dL (ref 6.0–8.5)
Sodium: 143 mmol/L (ref 134–144)

## 2014-07-23 LAB — CBC WITH AUTOMATED DIFF
ABS. BASOPHILS: 0 10*3/uL (ref 0.0–0.2)
ABS. EOSINOPHILS: 0.3 10*3/uL (ref 0.0–0.4)
ABS. IMM. GRANS.: 0 10*3/uL (ref 0.0–0.1)
ABS. MONOCYTES: 0.4 10*3/uL (ref 0.1–0.9)
ABS. NEUTROPHILS: 4.3 10*3/uL (ref 1.4–7.0)
Abs Lymphocytes: 1.3 10*3/uL (ref 0.7–3.1)
BASOPHILS: 0 %
EOSINOPHILS: 5 %
HCT: 38.1 % (ref 34.0–46.6)
HGB: 12.9 g/dL (ref 11.1–15.9)
IMMATURE GRANULOCYTES: 0 %
Lymphocytes: 21 %
MCH: 28.9 pg (ref 26.6–33.0)
MCHC: 33.9 g/dL (ref 31.5–35.7)
MCV: 85 fL (ref 79–97)
MONOCYTES: 7 %
NEUTROPHILS: 67 %
PLATELET: 312 10*3/uL (ref 150–379)
RBC: 4.46 x10E6/uL (ref 3.77–5.28)
RDW: 13.2 % (ref 12.3–15.4)
WBC: 6.3 10*3/uL (ref 3.4–10.8)

## 2014-07-23 LAB — CVD REPORT

## 2014-07-23 LAB — LIPID PANEL
Cholesterol, total: 255 mg/dL — ABNORMAL HIGH (ref 100–199)
HDL Cholesterol: 60 mg/dL (ref >39)
LDL, calculated: 173 mg/dL — ABNORMAL HIGH (ref 0–99)
Triglyceride: 109 mg/dL (ref 0–149)
VLDL, calculated: 22 mg/dL (ref 5–40)

## 2014-07-23 NOTE — Progress Notes (Signed)
Quick Note:        Message sent about labs        ______

## 2014-08-11 MED ORDER — ALENDRONATE 70 MG TAB
70 mg | ORAL_TABLET | ORAL | Status: DC
Start: 2014-08-11 — End: 2014-09-17

## 2014-08-13 DIAGNOSIS — C50919 Malignant neoplasm of unspecified site of unspecified female breast: Secondary | ICD-10-CM

## 2014-08-13 HISTORY — PX: BREAST LUMPECTOMY: SHX2

## 2014-08-13 HISTORY — DX: Malignant neoplasm of unspecified site of unspecified female breast: C50.919

## 2014-09-18 MED ORDER — ALENDRONATE 70 MG TAB
70 mg | ORAL_TABLET | ORAL | Status: DC
Start: 2014-09-18 — End: 2014-10-21

## 2014-09-21 NOTE — Telephone Encounter (Signed)
Yes ok to stop and can restart about 3 days after

## 2014-09-21 NOTE — Telephone Encounter (Signed)
Reba from Dr. Camelia Eng office is calling regarding the pt. Pt is getting a right breast biopsy and she wanted to know if it was ok for her to stop taking aspirin and meloxicum for 5 days. [phone].

## 2014-10-21 MED ORDER — ALENDRONATE 70 MG TAB
70 mg | ORAL_TABLET | ORAL | Status: DC
Start: 2014-10-21 — End: 2014-11-15

## 2014-10-21 NOTE — Telephone Encounter (Signed)
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

## 2014-11-15 MED ORDER — ALENDRONATE 70 MG TAB
70 mg | ORAL_TABLET | ORAL | Status: DC
Start: 2014-11-15 — End: 2014-12-16

## 2014-12-01 ENCOUNTER — Ambulatory Visit: Admit: 2014-12-01 | Discharge: 2014-12-01 | Payer: MEDICARE | Attending: Specialist | Primary: Internal Medicine

## 2014-12-01 DIAGNOSIS — C50911 Malignant neoplasm of unspecified site of right female breast: Secondary | ICD-10-CM

## 2014-12-01 MED ORDER — LIDOCAINE-PRILOCAINE 2.5 %-2.5 % TOPICAL CREAM
CUTANEOUS | Status: DC | PRN
Start: 2014-12-01 — End: 2016-08-28

## 2014-12-01 MED ORDER — DEXAMETHASONE 4 MG TAB
4 mg | ORAL_TABLET | ORAL | Status: DC
Start: 2014-12-01 — End: 2015-06-14

## 2014-12-01 MED ORDER — ONDANSETRON HCL 8 MG TAB
8 mg | ORAL_TABLET | Freq: Three times a day (TID) | ORAL | Status: DC | PRN
Start: 2014-12-01 — End: 2015-06-14

## 2014-12-01 MED ORDER — PROCHLORPERAZINE MALEATE 10 MG TAB
10 mg | ORAL_TABLET | Freq: Four times a day (QID) | ORAL | Status: DC | PRN
Start: 2014-12-01 — End: 2015-06-14

## 2014-12-01 NOTE — Progress Notes (Signed)
[facility]  [employer], [place]   [address]  W: [phone]   F: Welcome      Reason for visit:  evaluation for treatment for    Consulting physician:  Dr. Gilford Rile    HPI:   Jillian Bean is a 74 y.o.  female who I was asked to see in consultation at the request of Dr. Elie Confer for evaluation for systemic therapy for breast cancer.    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  Right lumpectomy on 11/10/14 shows IDC, 1.7 cm, gr 3, 0/5 LN, no LVI.  PT1cN0Mx.    She has no complaints today.    DX   Encounter Diagnoses   Name Primary?   ??? Malignant neoplasm of right female breast, unspecified site of breast ([place]) Yes   ??? Encounter for preprocedural cardiovascular examination              Past Medical History   Diagnosis Date   ??? Plantar fasciitis 09/01/2007   ??? Colonic polyps 08/31/1998   ??? OA (osteoarthritis) 09/01/2007   ??? Back pain 09/01/2007   ??? Hypercholesteremia 09/01/2007   ??? Hypothyroidism 09/01/2003   ??? S/P colonoscopy 10/10/2007   ??? 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 Hiatal hernia 07/11/2011     Dr.sobieski   ??? 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 Thyroid disease    ??? Coagulation disorder ([place])      IRON DEFFICIENCY ANEMIA   ??? Nausea & vomiting    ??? Other ill-defined conditions(799.89)      VERTIGO     Past Surgical History   Procedure Laterality Date   ??? Hx gyn       Hysterectomy in her 64's   ??? Pr breast surgery procedure unlisted  1993     Breast Reduction   ??? Endoscopy, colon, diagnostic  7/12/18/08     7/05 Dr Maudry Diego   ??? Hx other surgical       MINIMALLY INVASIVE LUMBAR DECOMPRESSION   ??? Hx gi       COLONOSCOPY   ??? Hx heent  04/07/10     thyroid biopsy neg dr Leward Quan   ??? Hx heent        wisdom teeth extraction   ??? Hx orthopaedic  08/22/09     left TKR   ??? Hx orthopaedic  08/13/12     right tkr     History     Social History   ??? Marital Status: MARRIED     Spouse Name: N/A   ??? Number of Children: N/A   ??? Years of Education: N/A     Social History Main Topics   ??? Smoking status: Never Smoker    ??? Smokeless tobacco: Never Used   ??? Alcohol Use: No   ??? Drug Use: No   ??? Sexual Activity: Not Currently     Other Topics Concern   ??? None     Social History Narrative     Family History   Problem Relation Age of Onset   ??? Cancer Mother      Breast   ??? Heart  Failure Father      MI   ??? Seizures Brother      ms       Current Outpatient Prescriptions   Medication Sig Dispense Refill   ??? latanoprost (XALATAN) 0.005 % ophthalmic solution   6   ??? ondansetron hcl (ZOFRAN) 8 mg tablet Take 1 Tab by mouth every eight (8) hours as needed for Nausea. 24 Tab 3   ??? lidocaine-prilocaine (EMLA) topical cream Apply  to affected area as needed for Pain. 30 g 0   ??? dexamethasone (DECADRON) 4 mg tablet 8 mg twice a day the day before and day after chemo 32 Tab 3   ??? prochlorperazine (COMPAZINE) 10 mg tablet Take 1 Tab by mouth every six (6) hours as needed for Nausea. 50 Tab 5   ??? alendronate (FOSAMAX) 70 mg tablet Take 1 Tab by mouth Every Thursday. 4 Tab 3   ??? colesevelam (WELCHOL) 625 mg tablet 1 po bid 180 Tab 2   ??? levothyroxine (SYNTHROID) 112 mcg tablet 8 per week 100 Tab 3   ??? meloxicam (MOBIC) 7.5 mg tablet TAKE 1 TABLET BY MOUTH DAILY 90 Tab 1   ??? aspirin 81 mg chewable tablet Take 81 mg by mouth daily.     ??? multivitamin (ONE A DAY) tablet Take 1 Tab by mouth daily.     ??? cholecalciferol, vitamin d3, (VITAMIN D) 1,000 unit tablet Take  by mouth daily.     ??? alendronate (FOSAMAX) 70 mg tablet TAKE 1 TABLET BY MOUTH EVERY THURSDAY 4 Tab 0   ??? hydroquinone (ESOTERICA, MELQUIN) 4 % topical cream   1   ??? desonide (DESOWEN) 0.05 % topical ointment Apply  to affected area two (2) times a day. 15 g 0   ??? OTHER       ??? CALCIUM CARBONATE, [id number], (TUMS PO) Take  by mouth daily.         Allergies   Allergen Reactions   ??? Zocor [Simvastatin] Other (comments) and Diarrhea     Other reaction(s): Adverse reaction to substance   Myalgias     ??? Celebrex [Celecoxib] Other (comments)     Aggitation   ??? Gabapentin Other (comments)     FELT UNSTEADY ON MY FEET   ??? Levaquin [Levofloxacin] Palpitations   ??? Nexium [Esomeprazole Magnesium] Diarrhea   ??? Oxycontin [Oxycodone] Itching   ??? Pravastatin Nausea and Vomiting   ??? Yellow Dye Hives       Review of Systems    A comprehensive review of systems was performed and all systems were negative except for HPI.        Objective:  Physical Exam:  BP 151/57 mmHg   Pulse 76   Temp(Src) 97.1 ??F (36.2 ??C) (Temporal)   Resp 16   Ht '5\' 6"'  (1.676 m)   Wt 155 lb 6.4 oz (70.489 kg)   BMI 25.09 kg/m2   SpO2 98%   LMP 04/13/2010    General: No distress  Respiratory: Normal respiratory effort  CV: No peripheral edema  Skin: No rashes, ecchymoses, or petechiae  Psych: Alert, oriented, normal mood/affect  Diagnostic Imaging   Results for orders placed during the hospital encounter of 09/03/12   XR KNEE RT MAX 2 VWS    Narrative **Final Report**       ICD Codes / Adm.Diagnosis: 780.4  70 / DJD RIGHT KNEE  DJD RIGHT KNEE  Examination:  CR KNEE MAX 2 VWS RT  - [id number] - Sep 03 2012 12:18PM  Accession No:  [id number]  Reason:  post-op film      REPORT:  INDICATION:  Postop.    COMPARISON:  No old study.    FINDINGS: Portable AP and lateral views of the right knee obtained in the   PACU show a total knee replacement in satisfactory position.  Postoperative   change in the soft tissues is present.        IMPRESSION:   Postop change right knee replacement surgery.                          Signing/Reading Doctor: Milus Glazier [phone])    Approved: Milus Glazier ([id number])  Sep 03 2012 12:31PM                                 Results for orders placed during the hospital encounter of 08/13/12   CT HEAD WO CONT    Narrative **Final Report**       ICD Codes / Adm.Diagnosis: 100002  70 / Dizziness  Nausea  Examination:  CT HEAD WO CON  - [id number] - Aug 13 2012  1:03AM  Accession No:  [id number]  Reason:  Pain      REPORT:  INDICATION:   Pain    EXAM:  HEAD CT WITHOUT CONTRAST    COMPARISON:  None    TECHNIQUE:  Routine noncontrast axial head CT was performed.    FINDINGS:    The ventricles are midline without hydrocephalus.  There is no acute intra   or extra-axial hemorrhage.  There is no significant white matter disease.    The basal cisterns are patent. The paranasal sinuses are clear.        IMPRESSION:    No acute process.                  Signing/Reading Doctor: Leonie Green LEWIS [phone])    Approved: Leonie Green LEWIS [phone])  Aug 13 2012  1:07AM                                     Lab Results  Lab Results   Component Value Date/Time    WBC 6.3 07/22/2014 11:47 AM    HGB 12.9 07/22/2014 11:47 AM    HCT 38.1 07/22/2014 11:47 AM    PLATELET 312 07/22/2014 11:47 AM    MCV 85 07/22/2014 11:47 AM       Lab Results   Component Value Date/Time    SODIUM 143 07/22/2014 11:47 AM    POTASSIUM 4.4 07/22/2014 11:47 AM    CHLORIDE 105 07/22/2014 11:47 AM    CO2 24 07/22/2014 11:47 AM    ANION GAP 8 09/05/2012 03:53 AM    GLUCOSE 88 07/22/2014 11:47 AM    BUN 16 07/22/2014 11:47 AM    CREATININE 0.70 07/22/2014 11:47 AM    BUN/CREATININE RATIO 23 07/22/2014 11:47 AM  GFR EST AA 99 07/22/2014 11:47 AM    GFR EST NON-AA 86 07/22/2014 11:47 AM    CALCIUM 9.0 07/22/2014 11:47 AM    ALT 18 07/22/2014 11:47 AM    AST 18 07/22/2014 11:47 AM    ALK. PHOSPHATASE 86 07/22/2014 11:47 AM    PROTEIN, TOTAL 6.9 07/22/2014 11:47 AM    ALBUMIN 4.5 07/22/2014 11:47 AM    GLOBULIN 3.5 08/18/2009 01:05 PM    A-G RATIO 1.9 07/22/2014 11:47 AM       .    Assessment/Plan:  74 y.o. 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  PS 0    1. Right Breast cancer stage: IA    Hormonal therapy: not indicated due to receptor (-) status    We explained to the patient that the goal of systemic adjuvant therapy is to improve the chances for cure and decrease the risk of relapse. We explained why a patient can have microscopic cancer spread now even though physical examination, laboratory studies and imaging studies are negative for cancer. We explained that the same treatments used now as adjuvant or preventive treatments rarely if ever are curative in women who develop metastases.     She is in excellent health and thus an adjuvant discussion is warranted.  trastuzumab based therapy corresponds to a 9% overall survival benefit, which is rather larger overall (that corresponds to a nearly 20% DFS benefit).    [id number] suggests equivalency between q.3 week Adriamycin, Cytoxan followed by weekly paclitaxel and trastuzumab compared with the [facility] regimen. However, this study was not powered to show a difference between these two regimens, and in the NEJM publication, the AC-TH arm showed a numerically advantange (though not statistically significant) to the [facility] arm.?? In this patient, it is completely reasonable to use [facility] approach and avoid the potential cardiotoxicity of the anthracyclines as well as the potential for leukemia.    We discussed the toxicities of docetaxel and carboplatin chemotherapy in detail.?? This chemotherapy frequently causes a low white blood cell count and hospital admissions for treatment of neutropenic fever.?? We explained that we consider the use of growth factors to minimize this risk.?? We explained to the patient that some side effects if they occur only last a few days including nausea, vomiting, stomatitis, arthralgia, myalgia,and allergic reactions to Taxotere.?? We told the patient that severe nausea and vomiting were uncommon and that some side effects,if they occur, will  last longer; this includes hair loss, which will be seen in all patients treated with these agents and fatigue,which will be seen in most.?? We also informed that for the patient that heart damage is rare with these agents.?? We explained that carboplatin can rarely cause kidney damage and high frequency hearing loss.?? We provided the patient in detail her information concerning the toxicities of this regimen in addition to her overall discussion.??     Rationale for therapy with trastuzumab was also discussed with the patient including a 50% proportional improvement in disease free survival and also an improvement in overall survival in patients receiving trastuzumab and chemotherapy for HER-2 positive breast cancer.?? The side effects of trastuzumab were discussed including a 4%-5% risk of dropping her ejection fraction while on treatment and about a 1% risk of CHF.?? We discussed that this drug will be used every 3 weeks for remainder of a year following the chemotherapy cycles.?? We will check her EF before chemotherapy  and every 3 months while she is receiving trastuzumab.    She is not a candidate to receive pertuzumab with her stage I disease.    We also discussed the APT study which is 12 weeks of weekly paclitaxel with trastuzumab.    After this discussion, she is agreeable to neoadjuvant TCH q 3 weeks x 6 followed by outback trastuzumab and pertuzumab for the completion of a year of therapy (docetaxel 75 mg/m2; carboplatin auc 6; trastuzumab 63m/kg load then 6 mg/kg).?? Informed consent to be signed at next visit.    The patient was given presciptions for emla cream, decadron to take 8 mg bid the day before and day after chemo, zofran and compazine.?? Neulasta?? the following day.    TTE ordered and cardiology referral made.?? Dr. DElie Conferto place port.    2. Emotional well being:  She has excellent support and is coping well with her disease.    3. FH of breast cancer:  Mother with breast cancer at age 75 father's  sister with breast cancer in her 576s  Will discuss genetic testing.    Thank you for this consult.  All of the patient's questions were answered today.    > 60 min were spent with this patient with > 50% of that time spent in face to face counseling.        Patient Instructions     Stage I, estrogen and progesterone receptor negative, HER 2 POSITIVE breast cancer    Common Side Effects of Chemotherapy  Decreased Blood Counts Your blood counts can decrease temporarily due to chemotherapy, they will recover over time.   This is an expected side effect that your Doctor will be monitoring.  - If you experience fevers (temperature >100.4??F), bleeding or unexplained bruising, please call the office right away   Risk of Infection Your white blood cells can decrease temporarily due to chemotherapy and can put you at higher risk of infection.  Washing hands frequently with soap and avoiding sick contacts can reduce your risk of infection.  - If you experience fevers (temperature >100.4??F), shaking chills, or any signs of infection, please call the office immediately   Anemia Chemotherapy can cause your red blood cells to temporarily decrease; this is an expected side effect that your Doctor will be monitoring.  - You may experience fatigue if this occurs, please notify the office if you experience bleeding, shortness of breath with minimal exertion or at rest, rapid heartbeat, or feeling as though you may lose consciousness.   Hair Loss Chemotherapy can affect your hair follicles and cause you to lose hair.  This can occur on your scalp hair but also all over your body including eyebrows and eye lashes   Nausea  You have been prescribed nausea medication to take if needed.  Please follow the directions given to you by your Doctor.  - Please call the office if the medications you have been given are not relieving nausea.   Vomiting Make sure you are taking anti-nausea medication as prescribed.   Eating small amounts of bland foods frequently can help.  - Please call the office right away if you are vomiting more than 4 times per day or are unable to keep down food or fluids   Diarrhea Eating small amounts of bland foods frequently can help, increase your fluid intake.  It is usually ok to take Imodium for diarrhea.  - Please call the office right away if you experience more than 4 episodes  of watery diarrhea or if you are feeling dehydrated.     Female patients of childbearing age need to avoid pregnancy during chemotherapy.     You can reach Medical Oncology at [facility] with further questions or concerns at: [phone].  - Calls during normal business hours will reach our office.  - Calls after hours or on the weekend will reach an answering service and the on-call Oncologist will return your call.      Dr. Elie Confer for port    Echo and cardiologist    presciptions for  emla cream, decadron to take 8 mg twice a day the day before and day after chemo, zofran and compazine.  Neulasta the following day.       Follow-up Disposition:  Return in about 3 weeks (around 12/21/2014).    Joyce Gross, MD

## 2014-12-01 NOTE — Progress Notes (Signed)
Jillian Bean is a 74 y.o. female

## 2014-12-01 NOTE — Patient Instructions (Signed)
Stage I, estrogen and progesterone receptor negative, HER 2 POSITIVE breast cancer    Common Side Effects of Chemotherapy  Decreased Blood Counts Your blood counts can decrease temporarily due to chemotherapy, they will recover over time.   This is an expected side effect that your Doctor will be monitoring.  - If you experience fevers (temperature >100.4??F), bleeding or unexplained bruising, please call the office right away   Risk of Infection Your white blood cells can decrease temporarily due to chemotherapy and can put you at higher risk of infection.  Washing hands frequently with soap and avoiding sick contacts can reduce your risk of infection.  - If you experience fevers (temperature >100.4??F), shaking chills, or any signs of infection, please call the office immediately   Anemia Chemotherapy can cause your red blood cells to temporarily decrease; this is an expected side effect that your Doctor will be monitoring.  - You may experience fatigue if this occurs, please notify the office if you experience bleeding, shortness of breath with minimal exertion or at rest, rapid heartbeat, or feeling as though you may lose consciousness.   Hair Loss Chemotherapy can affect your hair follicles and cause you to lose hair.  This can occur on your scalp hair but also all over your body including eyebrows and eye lashes   Nausea  You have been prescribed nausea medication to take if needed.  Please follow the directions given to you by your Doctor.  - Please call the office if the medications you have been given are not relieving nausea.   Vomiting Make sure you are taking anti-nausea medication as prescribed.  Eating small amounts of bland foods frequently can help.  - Please call the office right away if you are vomiting more than 4 times per day or are unable to keep down food or fluids   Diarrhea Eating small amounts of bland foods frequently can help, increase  your fluid intake.  It is usually ok to take Imodium for diarrhea.  - Please call the office right away if you experience more than 4 episodes of watery diarrhea or if you are feeling dehydrated.     Female patients of childbearing age need to avoid pregnancy during chemotherapy.     You can reach Medical Oncology at [facility] with further questions or concerns at: [phone].  - Calls during normal business hours will reach our office.  - Calls after hours or on the weekend will reach an answering service and the on-call Oncologist will return your call.      Dr. Elie Confer for port    Echo and cardiologist    presciptions for  emla cream, decadron to take 8 mg twice a day the day before and day after chemo, zofran and compazine.  Neulasta the following day.

## 2014-12-02 NOTE — Telephone Encounter (Signed)
Phone call placed to patient to notify her of her appointment with Dr. Marlon Pel, on 5/5 at 10:00AM and echo. She voiced her understanding and had no further questions or concerns. Thank you.

## 2014-12-06 MED ORDER — MELOXICAM 7.5 MG TAB
7.5 mg | ORAL_TABLET | ORAL | Status: DC
Start: 2014-12-06 — End: 2015-03-24

## 2014-12-06 MED ORDER — OTHER
Status: DC
Start: 2014-12-06 — End: 2016-08-28

## 2014-12-08 ENCOUNTER — Inpatient Hospital Stay: Admit: 2014-12-08 | Primary: Internal Medicine

## 2014-12-16 ENCOUNTER — Ambulatory Visit: Admit: 2014-12-16 | Discharge: 2014-12-16 | Payer: MEDICARE | Attending: Internal Medicine | Primary: Internal Medicine

## 2014-12-16 ENCOUNTER — Institutional Professional Consult (permissible substitution): Admit: 2014-12-16 | Discharge: 2014-12-16 | Payer: MEDICARE | Primary: Internal Medicine

## 2014-12-16 ENCOUNTER — Ambulatory Visit: Attending: Internal Medicine | Primary: Internal Medicine

## 2014-12-16 DIAGNOSIS — E7800 Pure hypercholesterolemia, unspecified: Secondary | ICD-10-CM

## 2014-12-16 DIAGNOSIS — Z01810 Encounter for preprocedural cardiovascular examination: Secondary | ICD-10-CM

## 2014-12-16 MED ORDER — PEGFILGRASTIM 6 MG/0.6 ML (DELIVERABLE) WEARABLE SUBCUTANEOUS INJECTOR
6 mg/0. mL | Freq: Once | SUBCUTANEOUS | Status: AC
Start: 2014-12-16 — End: 2014-12-21
  Administered 2014-12-21: 20:00:00 via SUBCUTANEOUS

## 2014-12-16 MED ORDER — SODIUM CHLORIDE 0.9 % IV
10 mg/mL | Freq: Once | INTRAVENOUS | Status: AC
Start: 2014-12-16 — End: 2014-12-21
  Administered 2014-12-21: 19:00:00 via INTRAVENOUS

## 2014-12-16 MED ORDER — SODIUM CHLORIDE 0.9 % IV
INTRAVENOUS | Status: DC
Start: 2014-12-16 — End: 2014-12-25
  Administered 2014-12-21: 15:00:00 via INTRAVENOUS

## 2014-12-16 MED ORDER — OVERFILL VOLUME
20210 mg/2 mL (10 mg/mL) | Freq: Once | INTRAVENOUS | Status: AC
Start: 2014-12-16 — End: 2014-12-21
  Administered 2014-12-21: 17:00:00 via INTRAVENOUS

## 2014-12-16 MED ORDER — TRASTUZUMAB 440 MG IV SOLR
440 mg | Freq: Once | INTRAVENOUS | Status: AC
Start: 2014-12-16 — End: 2014-12-21
  Administered 2014-12-21: 16:00:00 via INTRAVENOUS

## 2014-12-16 MED ORDER — DEXAMETHASONE SODIUM PHOSPHATE 4 MG/ML IJ SOLN
4 mg/mL | Freq: Once | INTRAMUSCULAR | Status: AC | PRN
Start: 2014-12-16 — End: 2014-12-21
  Administered 2014-12-21: 15:00:00 via INTRAVENOUS

## 2014-12-16 MED ORDER — DEXAMETHASONE SODIUM PHOSPHATE 4 MG/ML IJ SOLN
4 mg/mL | Freq: Once | INTRAMUSCULAR | Status: DC
Start: 2014-12-16 — End: 2014-12-16

## 2014-12-16 MED ORDER — PALONOSETRON 0.25 MG/5 ML IV
0.25 mg/5 mL | Freq: Once | INTRAVENOUS | Status: AC
Start: 2014-12-16 — End: 2014-12-21
  Administered 2014-12-21: 15:00:00 via INTRAVENOUS

## 2014-12-16 MED FILL — SODIUM CHLORIDE 0.9 % IV: INTRAVENOUS | Qty: 1000

## 2014-12-16 NOTE — Progress Notes (Signed)
[facility] [place]: Jillian Bean  [id number]  Requesting/referring provider: Dr. Laurena Bering  Reason for Consult: Cardiotoxic chemotherapy    HPI: Jillian Bean, a 74 y.o. year-old who presents for cardio-oncology evaluation.   She has no current cardiac symptoms. Feels well in general, no chest pain, dyspnea, edema, palpitations.   Discussed chemotherapy, effects on the heart, potential cardiotoxicity and ongoing monitoring for toxicity during therapy.      Assessment/Plan:  1. Dyslipidemia- LDL 173, discussed diet, exercise, lifestyle, would not start on meds at this point given overall situation with chemo,etc and previouslreading of 134. Recheck in 6 months  2. Hypotension- chronic, stable, stay hydrated, etc  3. Right Breast Cancer stage IA- managed by Dr. Laurena Bering- /1/16 showing IDC, 7 mm, gr 3, ER negative, PR negative, ki67 15%, HER 2 positive (IHC 2+; FISH ratio 2.8; sig/cell 5.7).?? Right lumpectomy on 11/10/14 shows IDC, 1.7 cm, gr 3, 0/5 LN, no LVI.?? PT1cN0Mx.  --Plan for neoadjuvant TCH q 3 weeks x 6 followed by outback trastuzumab and pertuzumab for the completion of a year of therapy (docetaxel 75 mg/m2; carboplatin auc 6; trastuzumab 43m/kg load then 6 mg/kg).??  --trop with chemo cycles  --echo q377mofor transtuzamab duration  4. Body mass index is 25.03 kg/(m^2).    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    Cardiovascular ROS: negative for chest pain, DOE   Respiratory ROS: no cough, shortness of breath, or wheezing  Neurological ROS: no TIA or stroke symptoms  All other systems negative except as above.     PE  Filed Vitals:    12/16/14 1044   BP: 100/74   Pulse: 68   Resp: 16   Height: '5\' 6"'  (1.676 m)   Weight: 155 lb (70.308 kg)    Body mass index is 25.03 kg/(m^2).   General appearance - alert, well appearing, and in no distress  Mental status - affect appropriate to mood  Eyes - sclera anicteric, moist mucous membranes  Neck - supple, no significant adenopathy  Lymphatics - no  lymphadenopathy  Chest - clear to auscultation, no wheezes, rales or rhonchi  Heart - normal rate, regular rhythm, normal S1, S2, no murmurs, rubs, clicks or gallops  Abdomen - soft, nontender, nondistended, no masses or organomegaly  Back exam - full range of motion, no tenderness  Neurological - cranial nerves II through XII grossly intact, no focal deficit  Musculoskeletal - no muscular tenderness noted, normal strength  Extremities - peripheral pulses normal, no pedal edema  Skin - normal coloration  no rashes    Recent Labs:  Lab Results   Component Value Date/Time    CHOLESTEROL, TOTAL 255 07/22/2014 11:47 AM    HDL CHOLESTEROL 60 07/22/2014 11:47 AM    LDL, CALCULATED 173 07/22/2014 11:47 AM    TRIGLYCERIDE 109 07/22/2014 11:47 AM    CHOL/HDL RATIO 3.3 04/27/2009 11:15 AM     Lab Results   Component Value Date/Time    CREATININE 0.70 07/22/2014 11:47 AM     Lab Results   Component Value Date/Time    BUN 16 07/22/2014 11:47 AM  Lab Results   Component Value Date/Time    POTASSIUM 4.4 07/22/2014 11:47 AM     Lab Results   Component Value Date/Time    HEMOGLOBIN A1C 5.2 08/25/2012 01:34 PM     Lab Results   Component Value Date/Time    HGB 12.9 07/22/2014 11:47 AM     Lab Results   Component Value Date/Time    PLATELET 312 07/22/2014 11:47 AM       Reviewed:  Past Medical History   Diagnosis Date   ??? Plantar fasciitis 09/01/2007   ??? Colonic polyps 08/31/1998    ??? OA (osteoarthritis) 09/01/2007   ??? Back pain 09/01/2007   ??? Hypercholesteremia 09/01/2007   ??? Hypothyroidism 09/01/2003   ??? S/P colonoscopy 10/10/2007   ??? 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 Hiatal hernia 07/11/2011     Dr.sobieski   ??? 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 Thyroid disease    ??? Coagulation disorder (HCC)      IRON DEFFICIENCY ANEMIA   ??? Nausea & vomiting    ??? Other ill-defined conditions(799.89)      VERTIGO     History   Smoking status   ??? Never Smoker    Smokeless tobacco   ??? Never Used     History   Alcohol Use No     Allergies   Allergen Reactions   ??? Zocor [Simvastatin] Other (comments) and Diarrhea     Other reaction(s): Adverse reaction to substance   Myalgias     ??? Celebrex [Celecoxib] Other (comments)     Aggitation   ??? Gabapentin Other (comments)     FELT UNSTEADY ON MY FEET   ??? Levaquin [Levofloxacin] Palpitations   ??? Nexium [Esomeprazole Magnesium] Diarrhea   ??? Oxycontin [Oxycodone] Itching   ??? Pravastatin Nausea and Vomiting   ??? Yellow Dye Hives       Current Outpatient Prescriptions   Medication Sig   ??? meloxicam (MOBIC) 7.5 mg tablet TAKE 1 TABLET BY MOUTH DAILY   ??? 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 latanoprost (XALATAN) 0.005 % ophthalmic solution    ??? ondansetron hcl (ZOFRAN) 8 mg tablet Take 1 Tab by mouth every eight (8) hours as needed for Nausea.   ??? lidocaine-prilocaine (EMLA) topical cream Apply  to affected area as needed for Pain.   ??? dexamethasone (DECADRON) 4 mg tablet 8 mg twice a day the day before and day after chemo   ??? prochlorperazine (COMPAZINE) 10 mg tablet Take 1 Tab by mouth every six (6) hours as needed for Nausea.   ??? alendronate (FOSAMAX) 70 mg tablet Take 1 Tab by mouth Every Thursday.   ??? colesevelam (WELCHOL) 625 mg tablet 1 po bid   ??? levothyroxine (SYNTHROID) 112 mcg tablet 8 per week   ??? aspirin 81 mg chewable tablet Take 81 mg by mouth daily.    ??? multivitamin (ONE A DAY) tablet Take 1 Tab by mouth daily.   ??? cholecalciferol, vitamin d3, (VITAMIN D) 1,000 unit tablet Take  by mouth daily.     No current facility-administered medications for this visit.       Cinderella Christoffersen Rosalyn Charters, MD  [facility]  [address] [place] [address]

## 2014-12-16 NOTE — Progress Notes (Signed)
Quick Note:        Please let her know this is normal, thanks    ______

## 2014-12-21 ENCOUNTER — Inpatient Hospital Stay: Admit: 2014-12-21 | Payer: MEDICARE | Primary: Internal Medicine

## 2014-12-21 ENCOUNTER — Ambulatory Visit: Admit: 2014-12-21 | Discharge: 2014-12-21 | Payer: MEDICARE | Attending: Acute Care | Primary: Internal Medicine

## 2014-12-21 DIAGNOSIS — Z5111 Encounter for antineoplastic chemotherapy: Secondary | ICD-10-CM

## 2014-12-21 DIAGNOSIS — C50911 Malignant neoplasm of unspecified site of right female breast: Secondary | ICD-10-CM

## 2014-12-21 LAB — METABOLIC PANEL, COMPREHENSIVE
A-G Ratio: 1.4 (ref 1.1–2.2)
ALT (SGPT): 23 U/L (ref 12–78)
AST (SGOT): 18 U/L (ref 15–37)
Albumin: 3.9 g/dL (ref 3.5–5.0)
Alk. phosphatase: 86 U/L (ref 45–117)
Anion gap: 9 mmol/L (ref 5–15)
BUN/Creatinine ratio: 22 — ABNORMAL HIGH (ref 12–20)
BUN: 17 MG/DL (ref 6–20)
Bilirubin, total: 0.2 MG/DL (ref 0.2–1.0)
CO2: 26 mmol/L (ref 21–32)
Calcium: 8.5 MG/DL (ref 8.5–10.1)
Chloride: 105 mmol/L (ref 97–108)
Creatinine: 0.78 MG/DL (ref 0.55–1.02)
GFR est AA: >60 mL/min/{1.73_m2} (ref >60)
GFR est non-AA: >60 mL/min/{1.73_m2} (ref >60)
Globulin: 2.7 g/dL (ref 2.0–4.0)
Glucose: 93 mg/dL (ref 65–100)
Potassium: 4 mmol/L (ref 3.5–5.1)
Protein, total: 6.6 g/dL (ref 6.4–8.2)
Sodium: 140 mmol/L (ref 136–145)

## 2014-12-21 LAB — CBC WITH 3 PART DIFF
ABS. LYMPHOCYTES: 1.2 10*3/uL (ref 0.8–3.5)
ABS. MIXED CELLS: 0.8 10*3/uL (ref 0.2–1.2)
ABS. NEUTROPHILS: 12.2 10*3/uL — ABNORMAL HIGH (ref 1.8–8.0)
HCT: 34.8 % — ABNORMAL LOW (ref 35.0–47.0)
HGB: 11.7 g/dL (ref 11.5–16.0)
LYMPHOCYTES: 8 % — ABNORMAL LOW (ref 12–49)
MCH: 29.8 PG (ref 26.0–34.0)
MCHC: 33.6 g/dL (ref 30.0–36.5)
MCV: 88.5 FL (ref 80.0–99.0)
Mixed cells: 6 % (ref 3.2–16.9)
NEUTROPHILS: 86 % — ABNORMAL HIGH (ref 32–75)
PLATELET: 307 10*3/uL (ref 150–400)
RBC: 3.93 M/uL (ref 3.80–5.20)
RDW: 12.7 % (ref 11.8–15.8)
WBC: 14.2 10*3/uL — ABNORMAL HIGH (ref 3.6–11.0)

## 2014-12-21 MED ORDER — SODIUM CHLORIDE 0.9 % INJECTION
INTRAMUSCULAR | Status: AC | PRN
Start: 2014-12-21 — End: 2014-12-22
  Administered 2014-12-21: 13:00:00 via INTRAVENOUS

## 2014-12-21 MED ORDER — METHYLPREDNISOLONE (PF) 125 MG/2 ML IJ SOLR
125 mg/2 mL | INTRAMUSCULAR | Status: AC
Start: 2014-12-21 — End: 2014-12-22

## 2014-12-21 MED ORDER — FAMOTIDINE (PF) 20 MG/2 ML IV
20 mg/2 mL | INTRAVENOUS | Status: AC
Start: 2014-12-21 — End: 2014-12-22

## 2014-12-21 MED ORDER — HEPARIN, PORCINE (PF) 100 UNIT/ML IV SYRINGE
100 unit/mL | INTRAVENOUS | Status: AC | PRN
Start: 2014-12-21 — End: 2014-12-22
  Administered 2014-12-21: 21:00:00 via INTRAVENOUS

## 2014-12-21 MED ORDER — SODIUM CHLORIDE 0.9 % IJ SYRG
INTRAMUSCULAR | Status: AC | PRN
Start: 2014-12-21 — End: 2014-12-22
  Administered 2014-12-21 (×3): via INTRAVENOUS

## 2014-12-21 MED ORDER — DIPHENHYDRAMINE HCL 50 MG/ML IJ SOLN
50 mg/mL | INTRAMUSCULAR | Status: AC
Start: 2014-12-21 — End: 2014-12-22

## 2014-12-21 MED FILL — NEULASTA ONPRO 6 MG/0.6 ML WITH WEARABLE SUBCUTANEOUS INJECTOR: 6 mg/0. mL | SUBCUTANEOUS | Qty: 1

## 2014-12-21 MED FILL — FAMOTIDINE (PF) 20 MG/2 ML IV: 20 mg/2 mL | INTRAVENOUS | Qty: 2

## 2014-12-21 MED FILL — ALOXI 0.25 MG/5 ML INTRAVENOUS SOLUTION: 0.25 mg/5 mL | INTRAVENOUS | Qty: 1

## 2014-12-21 MED FILL — DEXAMETHASONE SODIUM PHOSPHATE 4 MG/ML IJ SOLN: 4 mg/mL | INTRAMUSCULAR | Qty: 3

## 2014-12-21 MED FILL — DIPHENHYDRAMINE HCL 50 MG/ML IJ SOLN: 50 mg/mL | INTRAMUSCULAR | Qty: 1

## 2014-12-21 MED FILL — HERCEPTIN 440 MG INTRAVENOUS SOLUTION: 440 mg | INTRAVENOUS | Qty: 564

## 2014-12-21 MED FILL — CARBOPLATIN 10 MG/ML IV SOLN: 10 mg/mL | INTRAVENOUS | Qty: 61.4

## 2014-12-21 MED FILL — SOLU-MEDROL (PF) 125 MG/2 ML SOLUTION FOR INJECTION: 125 mg/2 mL | INTRAMUSCULAR | Qty: 2

## 2014-12-21 MED FILL — DOCETAXEL 20 MG/2 ML (10 MG/ML) IV SOLN: 20 mg/2 mL (10 mg/mL) | INTRAVENOUS | Qty: 13.6

## 2014-12-21 NOTE — Progress Notes (Signed)
Problem: Chemotherapy Treatment  Goal: *Chemotherapy regimen followed  Outcome: Progressing Towards Goal  Patient to receive C1 of TCH today as ordered.

## 2014-12-21 NOTE — Patient Instructions (Signed)
We will see you back in 3 weeks for the next treatment  Please call with any questions or concerns, particularly if your side effects are not well controlled.

## 2014-12-21 NOTE — Progress Notes (Signed)
Jillian Bean is a 74 y.o. female  Chief Complaint   Patient presents with   ??? Breast Cancer

## 2014-12-21 NOTE — Progress Notes (Signed)
[facility]  [name] [name]   [address]  W: [phone]   F: [phone]      F/u HEME/ONC CONSULT      Reason for visit:  evaluation for treatment for breast cancer    Consulting physician:  Dr. Gilford Rile    HPI:   Jillian Bean is a 74 y.o.  female who I was asked to see in consultation at the request of Dr. Elie Confer for evaluation for systemic therapy for breast cancer.    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  Right lumpectomy on 11/10/14 shows IDC, 1.7 cm, gr 3, 0/5 LN, no LVI.  PT1cN0Mx.    TCH: 12/21/14-     Interval History:  In today to initiate treatment with [place].  Port placed yesterday, tolerated well.  She was seen by Dr. Marlon Pel on 12/16/14.  She took steroids yesterday, noted some flushing yesterday and today.  She is feeling well today.  Today complains of: gr 1 constipation, gr 1 insomnia, gr 1 headache.      DX   Encounter Diagnosis   Name Primary?   ??? Malignant neoplasm of right female breast, unspecified site of breast ([place]) Yes            Past Medical History   Diagnosis Date   ??? Plantar fasciitis 09/01/2007   ??? Colonic polyps 08/31/1998   ??? OA (osteoarthritis) 09/01/2007   ??? Back pain 09/01/2007   ??? Hypercholesteremia 09/01/2007   ??? Hypothyroidism 09/01/2003   ??? S/P colonoscopy 10/10/2007   ??? 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 Hiatal hernia 07/11/2011     Dr.sobieski   ??? 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 Thyroid disease    ??? Coagulation disorder ([place])      IRON DEFFICIENCY ANEMIA   ??? Nausea & vomiting    ??? Other ill-defined conditions(799.89)      VERTIGO     Past Surgical History   Procedure Laterality Date   ??? Hx gyn       Hysterectomy in her 46's   ??? Pr breast surgery procedure unlisted  1993      Breast Reduction   ??? Endoscopy, colon, diagnostic  7/12/18/08     7/05 Dr Maudry Diego   ??? Hx other surgical       MINIMALLY INVASIVE LUMBAR DECOMPRESSION   ??? Hx gi       COLONOSCOPY   ??? Hx heent  04/07/10     thyroid biopsy neg dr Leward Quan   ??? Hx heent       wisdom teeth extraction   ??? Hx orthopaedic  08/22/09     left TKR   ??? Hx orthopaedic  08/13/12     right tkr     History     Social History   ??? Marital Status: MARRIED     Spouse Name: N/A   ??? Number of Children: N/A   ??? Years of Education: N/A     Social History Main Topics   ??? Smoking status: Never Smoker    ??? Smokeless tobacco: Never Used   ??? Alcohol Use: No   ??? Drug Use: No   ???  Sexual Activity: Not Currently     Other Topics Concern   ??? None     Social History Narrative     Family History   Problem Relation Age of Onset   ??? Cancer Mother      Breast   ??? Heart Failure Father      MI   ??? Seizures Brother      ms       Current Outpatient Prescriptions   Medication Sig Dispense Refill   ??? docusate sodium (COLACE) 100 mg capsule Take 100 mg by mouth daily.     ??? meloxicam (MOBIC) 7.5 mg tablet TAKE 1 TABLET BY MOUTH DAILY 90 Tab 0   ??? 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 latanoprost (XALATAN) 0.005 % ophthalmic solution   6   ??? lidocaine-prilocaine (EMLA) topical cream Apply  to affected area as needed for Pain. 30 g 0   ??? dexamethasone (DECADRON) 4 mg tablet 8 mg twice a day the day before and day after chemo 32 Tab 3   ??? alendronate (FOSAMAX) 70 mg tablet Take 1 Tab by mouth Every Thursday. 4 Tab 3   ??? colesevelam (WELCHOL) 625 mg tablet 1 po bid 180 Tab 2   ??? levothyroxine (SYNTHROID) 112 mcg tablet 8 per week 100 Tab 3   ??? aspirin 81 mg chewable tablet Take 81 mg by mouth daily.     ??? multivitamin (ONE A DAY) tablet Take 1 Tab by mouth daily.     ??? cholecalciferol, vitamin d3, (VITAMIN D) 1,000 unit tablet Take  by mouth daily.     ??? ondansetron hcl (ZOFRAN) 8 mg tablet Take 1 Tab by mouth every eight (8) hours as needed for Nausea. 24 Tab 3    ??? prochlorperazine (COMPAZINE) 10 mg tablet Take 1 Tab by mouth every six (6) hours as needed for Nausea. 50 Tab 5     Facility-Administered Medications Ordered in Other Visits   Medication Dose Route Frequency Provider Last Rate Last Dose   ??? sodium chloride (NS) flush 10 mL  10 mL IntraVENous PRN Annie Paras, MD   10 mL at 12/21/14 0927   ??? heparin (porcine) pf 500 Units  500 Units IntraVENous PRN Annie Paras, MD       ??? sodium chloride 0.9 % injection 10 mL  10 mL IntraVENous PRN Annie Paras, MD   10 mL at 12/21/14 0923   ??? 0.9% sodium chloride infusion  25 mL/hr IntraVENous CONTINUOUS Joyce Gross, MD 25 mL/hr at 12/21/14 1056 25 mL/hr at 12/21/14 1056   ??? pegfilgrastim (NEULASTA) wearable SQ injector 6 mg  6 mg SubCUTAneous ONCE Joyce Gross, MD       ??? trastuzumab (HERCEPTIN) 564 mg in 0.9% sodium chloride 250 mL, overfill volume 25 mL IVPB  564 mg IntraVENous ONCE Joyce Gross, MD       ??? DOCEtaxel (TAXOTERE) 136 mg in 0.9% sodium chloride 250 mL, overfill volume 25 mL chemo infusion  136 mg IntraVENous ONCE Joyce Gross, MD       ??? CARBOplatin (PARAPLATIN) 614 mg in 0.9% sodium chloride 500 mL, overfill volume 50 mL chemo infusion  614 mg IntraVENous ONCE Joyce Gross, MD           Allergies   Allergen Reactions   ??? Zocor [Simvastatin] Other (comments) and Diarrhea     Other reaction(s): Adverse reaction to substance  Myalgias     ??? Celebrex [Celecoxib] Other (comments)     Aggitation   ??? Gabapentin Other (comments)     FELT UNSTEADY ON MY FEET   ??? Levaquin [Levofloxacin] Palpitations   ??? Nexium [Esomeprazole Magnesium] Diarrhea   ??? Oxycontin [Oxycodone] Itching   ??? Pravastatin Nausea and Vomiting   ??? Yellow Dye Hives       Review of Systems    A comprehensive review of systems was performed and all systems were negative except for HPI and for the symptom report form, reviewed and scanned in.        Objective:  Physical Exam:   BP 146/76 mmHg   Pulse 78   Temp(Src) 97.6 ??F (36.4 ??C) (Oral)   Resp 16   Ht 5' 4.96" (1.65 m)   Wt 155 lb 6.4 oz (70.489 kg)   BMI 25.89 kg/m2   SpO2 98%   LMP 04/13/2010    General:  Alert, cooperative, no distress, appears stated age.   Head:  Normocephalic, without obvious abnormality, atraumatic.   Eyes:  Conjunctivae/corneas clear. PERRL, EOMs intact.   Throat: Lips, mucosa, and tongue normal.    Neck: Supple, symmetrical, trachea midline, no adenopathy, thyroid: no enlargement/tenderness/nodules   Back:   Symmetric, no curvature. ROM normal. No CVA tenderness.   Lungs:   Clear to auscultation bilaterally.   Chest wall:  No tenderness or deformity.   Heart:  Regular rate and rhythm, S1, S2 normal, no murmur, click, rub or gallop.   Breast Exam:  S/p R lumpectomy.    Abdomen:   Soft, non-tender. Bowel sounds normal. No masses,  No organomegaly.   Extremities: Extremities normal, atraumatic, no cyanosis or edema.   Skin: Skin color, texture, turgor normal. No rashes or lesions.   Lymph nodes: Cervical, supraclavicular, and axillary nodes normal.   Neurologic: CNII-XII intact.     Diagnostic Imaging   Results for orders placed during the hospital encounter of 09/03/12   XR KNEE RT MAX 2 VWS    Narrative **Final Report**       ICD Codes / Adm.Diagnosis: 780.4  70 / DJD RIGHT KNEE  DJD RIGHT KNEE  Examination:  CR KNEE MAX 2 VWS RT  - [id number] - Sep 03 2012 12:18PM  Accession No:  [id number]  Reason:  post-op film      REPORT:  INDICATION:  Postop.    COMPARISON:  No old study.    FINDINGS: Portable AP and lateral views of the right knee obtained in the   PACU show a total knee replacement in satisfactory position.  Postoperative   change in the soft tissues is present.        IMPRESSION:   Postop change right knee replacement surgery.                          Signing/Reading Doctor: Milus Glazier [phone])    Approved: Milus Glazier ([id number])  Sep 03 2012 12:31PM                                 Results for orders placed during the hospital encounter of 08/13/12   CT HEAD WO CONT    Narrative **Final Report**       ICD Codes / Adm.Diagnosis: 100002  70 / Dizziness  Nausea  Examination:  CT HEAD WO CON  - [id number] - Aug 13 2012  1:03AM  Accession No:  [id number]  Reason:  Pain      REPORT:  INDICATION:   Pain    EXAM:  HEAD CT WITHOUT CONTRAST    COMPARISON:  None    TECHNIQUE:  Routine noncontrast axial head CT was performed.    FINDINGS:    The ventricles are midline without hydrocephalus.  There is no acute intra   or extra-axial hemorrhage.  There is no significant white matter disease.    The basal cisterns are patent. The paranasal sinuses are clear.        IMPRESSION:    No acute process.                  Signing/Reading Doctor: Leonie Green LEWIS [phone])    Approved: Leonie Green LEWIS [phone])  Aug 13 2012  1:07AM                                   12/16/14 TTE: EF 71%.    Lab Results  Lab Results   Component Value Date/Time    WBC 14.2 12/21/2014 09:23 AM    HGB 11.7 12/21/2014 09:23 AM    HCT 34.8 12/21/2014 09:23 AM    PLATELET 307 12/21/2014 09:23 AM    MCV 88.5 12/21/2014 09:23 AM       Lab Results   Component Value Date/Time    SODIUM 140 12/21/2014 09:23 AM    POTASSIUM 4.0 12/21/2014 09:23 AM    CHLORIDE 105 12/21/2014 09:23 AM    CO2 26 12/21/2014 09:23 AM    ANION GAP 9 12/21/2014 09:23 AM    GLUCOSE 93 12/21/2014 09:23 AM    BUN 17 12/21/2014 09:23 AM    CREATININE 0.78 12/21/2014 09:23 AM    BUN/CREATININE RATIO 22 12/21/2014 09:23 AM    GFR EST AA >60 12/21/2014 09:23 AM    GFR EST NON-AA >60 12/21/2014 09:23 AM    CALCIUM 8.5 12/21/2014 09:23 AM    ALT 23 12/21/2014 09:23 AM    AST 18 12/21/2014 09:23 AM    ALK. PHOSPHATASE 86 12/21/2014 09:23 AM    PROTEIN, TOTAL 6.6 12/21/2014 09:23 AM    ALBUMIN 3.9 12/21/2014 09:23 AM    GLOBULIN 2.7 12/21/2014 09:23 AM    A-G RATIO 1.4 12/21/2014 09:23 AM       .    Assessment/Plan:  74 y.o. 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  PS 0    1. Right Breast cancer stage: IA    Hormonal therapy: not indicated due to receptor (-) status    We explained to the patient that the goal of systemic adjuvant therapy is to improve the chances for cure and decrease the risk of relapse. We explained why a patient can have microscopic cancer spread now even though physical examination, laboratory studies and imaging studies are negative for cancer. We explained that the same treatments used now as adjuvant or preventive treatments rarely if ever are curative in women who develop metastases.     She is in excellent health and thus an adjuvant discussion is warranted.  trastuzumab based therapy corresponds to a 9% overall survival benefit, which is rather larger overall (that corresponds to a nearly 20% DFS benefit).    [id number] suggests equivalency between q.3 week Adriamycin, Cytoxan followed by weekly  paclitaxel and trastuzumab compared with the [facility] regimen. However, this study was not powered to show a difference between these two regimens, and in the NEJM publication, the AC-TH arm showed a numerically advantange (though not statistically significant) to the [facility] arm.?? In this patient, it is completely reasonable to use [facility] approach and avoid the potential cardiotoxicity of the anthracyclines as well as the potential for leukemia.    We discussed the toxicities of docetaxel and carboplatin chemotherapy in detail.?? This chemotherapy frequently causes a low white blood cell count and hospital admissions for treatment of neutropenic fever.?? We explained that we consider the use of growth factors to minimize this risk.?? We explained to the patient that some side effects if they occur only last a few days including nausea, vomiting, stomatitis, arthralgia, myalgia,and allergic reactions to Taxotere.?? We told the patient that severe nausea and vomiting were uncommon and that some side effects,if they occur, will  last longer; this includes hair loss, which will be seen in all patients treated with these agents and fatigue,which will be seen in most.?? We also informed that for the patient that heart damage is rare with these agents.?? We explained that carboplatin can rarely cause kidney damage and high frequency hearing loss.?? We provided the patient in detail her information concerning the toxicities of this regimen in addition to her overall discussion.??     Rationale for therapy with trastuzumab was also discussed with the patient including a 50% proportional improvement in disease free survival and also an improvement in overall survival in patients receiving trastuzumab and chemotherapy for HER-2 positive breast cancer.?? The side effects of trastuzumab were discussed including a 4%-5% risk of dropping her ejection fraction while on treatment and about a 1% risk of CHF.?? We discussed that this drug will be used every 3 weeks for remainder of a year following the chemotherapy cycles.?? We will check her EF before chemotherapy and every 3 months while she is receiving trastuzumab.    She is not a candidate to receive pertuzumab with her stage I disease.    We also discussed the APT study which is 12 weeks of weekly paclitaxel with trastuzumab.      After this discussion, she is agreeable to neoadjuvant TCH q 3 weeks x 6 followed by outback trastuzumab and pertuzumab for the completion of a year of therapy (docetaxel 75 mg/m2; carboplatin auc 6; trastuzumab 72m/kg load then 6 mg/kg).??    Informed consent signed today, patient provided with copy.    The patient was given presciptions for emla cream, decadron to take 8 mg bid the day before and day after chemo, zofran and compazine.?? Neulasta?? the following day.    TTE ordered and cardiology referral made, seen by Dr. BMarlon Pelon 12/16/14.?? Port placed by Dr. DElie Conferon 12/20/14.     TOssipee#1 today, will see her back in 3 weeks for Cycle #2.      2. Emotional well being:  She has excellent support and is coping well with her disease.    3. FH of breast cancer:  Mother with breast cancer at age 163 father's sister with breast cancer in her 563s  Will discuss genetic testing.    4. Constipation: Gr 1 present at baseline, she is currently taking a stool softener.  Constipation handout provided and reviewed with patient today, will monitor during chemotherapy.     Thank you for this consult.  All of the patient's questions were answered today.    >  25 min were spent with this patient with > 50% of that time spent in face to face counseling.      Follow-up Disposition:  Return in about 3 weeks (around 01/11/2015) for labs, Kingsley Herandez/maskell, C#2 [place].    Sonda Rumble, MD

## 2014-12-21 NOTE — Progress Notes (Signed)
[facility] VISIT NOTE    0900  Pt arrived at [facility] ambulatory and in no distress for C1 of TCH.  Assessment completed, pt c/o difficulty sleeping last night; otherwise, no complaints voiced at this time. New left chest powerport present today on admission. Mild bruising noted to the site, but incision site appears to be healing well.     Blood pressure 146/76, pulse 78, temperature 97.6 ??F (36.4 ??C), resp. rate 18, height 5' 4.96" (1.65 m), weight 70.58 kg (155 lb 9.6 oz), last menstrual period 04/13/2010, not currently breastfeeding.    Left chest port accessed with 0.75 in huber with no difficulty. Positive blood return noted and labs drawn.  Recent Results (from the past 12 hour(s))   CBC WITH 3 PART DIFF    Collection Time: 12/21/14  9:23 AM   Result Value Ref Range    WBC 14.2 (H) 3.6 - 11.0 K/uL    RBC 3.93 3.80 - 5.20 M/uL    HGB 11.7 11.5 - 16.0 g/dL    HCT 34.8 (L) 35.0 - 47.0 %    MCV 88.5 80.0 - 99.0 FL    MCH 29.8 26.0 - 34.0 PG    MCHC 33.6 30.0 - 36.5 g/dL    RDW 12.7 11.8 - 15.8 %    PLATELET 307 150 - 400 K/uL    NEUTROPHILS 86 (H) 32 - 75 %    MIXED CELLS 6 3.2 - 16.9 %    LYMPHOCYTES 8 (L) 12 - 49 %    ABS. NEUTROPHILS 12.2 (H) 1.8 - 8.0 K/UL    ABS. MIXED CELLS 0.8 0.2 - 1.2 K/uL    ABS. LYMPHOCYTES 1.2 0.8 - 3.5 K/UL    DF AUTOMATED     METABOLIC PANEL, COMPREHENSIVE    Collection Time: 12/21/14  9:23 AM   Result Value Ref Range    Sodium 140 136 - 145 mmol/L    Potassium 4.0 3.5 - 5.1 mmol/L    Chloride 105 97 - 108 mmol/L    CO2 26 21 - 32 mmol/L    Anion gap 9 5 - 15 mmol/L    Glucose 93 65 - 100 mg/dL    BUN 17 6 - 20 MG/DL    Creatinine 0.78 0.55 - 1.02 MG/DL    BUN/Creatinine ratio 22 (H) 12 - 20      GFR est AA >60 >60 ml/min/1.41m    GFR est non-AA >60 >60 ml/min/1.772m   Calcium 8.5 8.5 - 10.1 MG/DL    Bilirubin, total 0.2 0.2 - 1.0 MG/DL    ALT 23 12 - 78 U/L    AST 18 15 - 37 U/L    Alk. phosphatase 86 45 - 117 U/L    Protein, total 6.6 6.4 - 8.2 g/dL     Albumin 3.9 3.5 - 5.0 g/dL    Globulin 2.7 2.0 - 4.0 g/dL    A-G Ratio 1.4 1.1 - 2.2     Carboplatin dose recalculated to be [id number] on today's weight and labs-no new orders received.     Medications received:  NS IV  Aloxi IVP  Decadron IVP  Herceptin IV (loading dose over 90 minutes)  Docetaxel IV  Carboplatin IV  Neulasta SQ on body injector    Patient remained in [facility] for 30 minutes following completion of treatment for observation. Tolerated treatment well, no adverse reaction noted. [employer] handouts provided to patient as well as verbal education about chemo medications and neulasta. Opportunity  for questions was provided and all questions were answered. Port de-accessed and flushed per protocol. Positive blood return noted.    Blood pressure 172/80, pulse 70, temperature 97.1 ??F (36.2 ??C), resp. rate 18, height 5' 4.96" (1.65 m), weight 70.58 kg (155 lb 9.6 oz), last menstrual period 04/13/2010, not currently breastfeeding.    1635  D/C'd from [facility] ambulatory and in no distress accompanied by husband. Next appointment is 01/11/15 at 1100.

## 2014-12-21 NOTE — Progress Notes (Signed)
[facility]  Patient Navigator Encounter     Patient Name:  Jillian Bean    Medical History: breast cancer    Advance Directives: AMD on file; pt has no changes to document at this time. Recommended review at least once a year.     Narrative: Pt here for follow up and start of treatment. Pt appears to be actively coping with her diagnosis and treatment plan. Husband is here with her today and is providing support. Pt is a retired [employer] broadcast assistant and lives locally with her husband; they have one son. Pt has been provided information on supportive community resources (ACS, support groups, [employer]). Pt has been provided with information on how to obtain a wig. No needs, concerns at this time. Provided pt with my contact information and encouraged her to call as needed.     Barriers to Care: Reviewed and none were identified; will continue to monitor.     Plan:   1. Ongoing support as pt desires, needs    Thank you.   -Renee Ramus

## 2014-12-24 NOTE — Telephone Encounter (Signed)
Called patient to follow up after first chemotherapy treatment. Patient reports feeling well, and only issue is constipation. Patient is using miralax with relief. Patient advised to call us with any further questions or concerns.

## 2014-12-29 ENCOUNTER — Inpatient Hospital Stay: Admit: 2014-12-29 | Payer: MEDICARE | Primary: Internal Medicine

## 2014-12-29 DIAGNOSIS — I890 Lymphedema, not elsewhere classified: Secondary | ICD-10-CM

## 2014-12-29 MED ORDER — MAGIC MOUTHWASH AMBULATORY SIMPLE MEDICATION
ORAL | Status: DC
Start: 2014-12-29 — End: 2015-06-14

## 2014-12-29 NOTE — Telephone Encounter (Signed)
Called and spoke to patient, who states that she has an appointment with Dr. Elie Confer tomorrow to evaluate her port.  Patient states her port has been red for the last couple of days and she wanted to just be sure everything was ok with it.  Patient denies any drainage, pain, or fever and denies any redness or swelling in arms.  Patient also inquiring if a prescription for magic mouthwash could be sent to her pharmacy, stating that she has one ulcer in her mouth.  Advised patient that  magic mouthwash would be sent to pharmacy, and to keep [name] informed on what Dr. Darrick Meigs recommendations are.  Also, advised patient to call office if for some reason she wasn't able to see Dr. Elie Confer tomorrow and we would see her or send in prescription for antibiotic.  Patient voices understanding and denies any further questions at this time.  Per verbal order from Denny Peon, NP, orders received for the following:  Requested Prescriptions     Signed Prescriptions Disp Refills   ??? magic mouthwash solution 500 mL 2     Sig: Magic mouth wash   Maalox  Lidocaine 2% viscous   Diphenhydramine oral solution   Pharmacy to mix equal portions of ingredients to a total volume as indicated in the dispense amount.  Sig: Swish and spit 15-30 ml every 2-4 hours as needed for mouth pain. Ok to swallow for throat pain.     Authorizing Provider: Forbes Cellar     Ordering User: Reynaldo Minium, Johnanthony Wilden J

## 2014-12-29 NOTE — Telephone Encounter (Signed)
Patient called and stated that she has scheduled an appointment with Dr. Rush Landmark who did her port placement. It has been red and irritated around where the port was placed and wanted to let Dr. Laurena Bering and his team know what was going on. She was wondering if he had any advice about the irritation. Her CB# is [phone]. Thanks.

## 2014-12-29 NOTE — Progress Notes (Addendum)
[name]  [address]      OUTPATIENT physical Therapy Evaluation with CMS G codes    NAME: Jillian Bean AGE: 74 y.o.  GENDER: female  DATE: 12/29/2014  REFERRING PHYSICIAN: Steffanie Rainwater, MD   HISTORY AND BACKGROUND:This patient is a 74 y/o female with recent history of breast cancer, right, as noted below.  Pt states Dr. Elie Bean referred her for evaluation of potential lymphedema.  Pt without c/o swelling, shoulder dysfunction, or pain at this time. Pt reports port was placed 12/20/2014, and chemotherapy initiated 12/21/2014.  Redness/bruising L upper quadrant s/p port placement reportedly improving per patient. Pt is agreeable to proceeding with evaluation today.    Information from Jillian Bean Bean as follows:  An abnormal mammogram led to a right core breast biopsy on 10/12/14 showing IDC, 7 mm, gr 3, ER negative, PR negative, ki67 15%, HER 2 positive (IHC 2+; FISH ratio 2.8; sig/cell 5.7).?? Right lumpectomy on 11/10/14 shows IDC, 1.7 cm, gr 3, 0/5 LN, no LVI.?? [id number].    Primary Diagnosis:  ?? 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 Malignant neoplasm of breast (80.1)  Date of Onset: 11/10/2014  Present Symptoms and Functional Limitations: s/p lumpectomy and SLNB R 11/10/2014  Shoulder Pain and Disability Scale Score: 0/130; 0% impairment  Past Medical History:   Past Medical History   Diagnosis Date   ??? Plantar fasciitis 09/01/2007   ??? Colonic polyps 08/31/1998   ??? OA (osteoarthritis) 09/01/2007   ??? Back pain 09/01/2007   ??? Hypercholesteremia 09/01/2007   ??? Hypothyroidism 09/01/2003   ??? S/P colonoscopy 10/10/2007   ??? 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 Hiatal hernia 07/11/2011     [facility]   ??? 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 Thyroid disease    ??? Coagulation disorder (HCC)      IRON DEFFICIENCY ANEMIA   ??? Nausea & vomiting    ??? Other ill-defined conditions(799.89)      VERTIGO   ??? Cancer [facility])      R breast     Past Surgical History   Procedure Laterality Date   ??? Hx gyn       Hysterectomy in her 8's   ??? Pr breast surgery procedure unlisted  1993     Breast Reduction   ??? Endoscopy, colon, diagnostic  7/12/18/08     7/05 Dr Maudry Diego   ??? Hx other surgical       MINIMALLY INVASIVE LUMBAR DECOMPRESSION   ??? Hx gi       COLONOSCOPY   ??? Hx heent  04/07/10     thyroid biopsy neg dr Leward Quan   ??? Hx heent       wisdom teeth extraction   ??? Hx orthopaedic  08/22/09     left TKR   ??? Hx orthopaedic  08/13/12     right tkr     Current Medications:    Current Outpatient Prescriptions   Medication Sig   ??? docusate sodium (COLACE) 100 mg capsule Take 100 mg by mouth daily.   ??? meloxicam (MOBIC) 7.5 mg tablet TAKE 1 TABLET BY MOUTH DAILY   ??? 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 latanoprost (XALATAN) 0.005 % ophthalmic  solution    ??? ondansetron hcl (ZOFRAN) 8 mg tablet Take 1 Tab by mouth every eight (8) hours as needed for Nausea.   ??? lidocaine-prilocaine (EMLA) topical cream Apply  to affected area as needed for Pain.   ??? dexamethasone (DECADRON) 4 mg tablet 8 mg twice a day the day before and day after chemo   ??? prochlorperazine (COMPAZINE) 10 mg tablet Take 1 Tab by mouth every six (6) hours as needed for Nausea.   ??? alendronate (FOSAMAX) 70 mg tablet Take 1 Tab by mouth Every Thursday.   ??? colesevelam (WELCHOL) 625 mg tablet 1 po bid   ??? levothyroxine (SYNTHROID) 112 mcg tablet 8 per week   ??? aspirin 81 mg chewable tablet Take 81 mg by mouth daily.   ??? multivitamin (ONE A DAY) tablet Take 1 Tab by mouth daily.   ??? cholecalciferol, vitamin d3, (VITAMIN D) 1,000 unit tablet Take  by mouth daily.     No current facility-administered medications for this encounter.     Allergies:   Allergies   Allergen Reactions    ??? Zocor [Simvastatin] Other (comments) and Diarrhea     Other reaction(s): Adverse reaction to substance   Myalgias     ??? Celebrex [Celecoxib] Other (comments)     Aggitation   ??? Gabapentin Other (comments)     FELT UNSTEADY ON MY FEET   ??? Levaquin [Levofloxacin] Palpitations   ??? Nexium [Esomeprazole Magnesium] Diarrhea   ??? Oxycontin [Oxycodone] Itching   ??? Pravastatin Nausea and Vomiting   ??? Yellow Dye Hives      Social/Work History and Prior Level of Function: Pt is retired from [employer].  No reported function limitations prior to breast cancer diagnosis/treatment.   Living Situation: lives with spouse.      Trainable Caregiver?: spouse present for appt today, supportive.   Self-care/ADLs: indep     Mobility: indep   Sleeping Arrangement:  bed   Adaptive Equipment Owned: walker/cane.   Other: na  Previous Therapy:  None to address potential for lymphedema.  Compression/Lymphedema Equipment:  none    SUBJECTIVE:   Pt reports Dr. Elie Bean referred her to this clinic for assessment of R UE regarding lymphedema, s/p recent lumpectomy, SLNB, initiating chemotherapy 12/21/2014.  Patient???s goals for therapy: R UE assessed for lymphedema.    EVALUATION AND OBJECTIVE DATA SUMMARY:   Pain:no c/o pain  Pain Scale 1: Numeric (0 - 10)  Pain Intensity 1: 0     Skin and Tissue Assessment:  Dermal Status:  (x )  Intact ( )  Dry   ( )  Tenuous ( )  Flaky   ( )  Wound/lesion present (x )  Scars: Right breast/axilla, no c/o with palpation.  Scars mobile.  Port placement incision closed.  Bean redness, no warmth to touch, and contusion surrounding, distal to port incision.  Pt reports redness present since surgery, and has improved since 12/20/2014.   ( )  Dermatitis    Texture/Consistency: na  ( )  Boggy ( )  Pitting Edema   ( )  Brawny ( )  Combination   ( )  Fibrotic/Woody    Pigmentation/Color Change:  (x )  Normal ( )  Hemosiderin   ( )  Red ( )  Erythematous   ( )  Hyperpigmented ( )  Hyperlipodermatosclerosis   Anomalies: na   ( )  Lymphorrhea ( )  Vesicles   ( )  Petechiae ( )  Warty Vercusis   ( )  Bullae ( )  Papilloma     Nails:  (x )  Normal  ( )  Fungus  Stemmers Sign: negative  Height:  Height: '5\' 6"'  (167.6 cm)  Weight:  Weight: 67.132 kg (148 lb)   BMI:  BMI (calculated): 23.9  (36 or greater: adversely affecting lymphedema)  Volumetric Measurements:   Right:  1992.50 mL Left:  1988.66 mL   % Difference: 0.19% Dominance: Right   (See scanned graph)  Range of Motion: Bilateral UE grossly WFL.  Strength: bilateral UE 5/5 with MMT without c/o pain.  Sensation:  Intact  Mobility:  Bed/Chair Mobility:  indep Transfers:  indep   Sitting Balance:  good Standing Balance:  good   Gait:  indep community distances without device.  Pt walks regularly for exercise. Wheelchair Mobility:  na   Endurance:  Intact for daily activities, walking program Stairs:  Not assessed.       Safety:  Patient is alert and oriented:  x4   Safety awareness:  intact   Fall Risk?:  low   Patient given written fall prevention handout: Yes   Precautions:  Standard lymphedema precautions to include avoiding blood pressure readings, injections and IVs or other procedures/acts that could lead to broken skin on affected area, and avoiding excessive heat, resistive activity or altitude without compression garment    Evaluation Time: 11:10-11:40 am  30 minutes    TREATMENT PROVIDED:   1.  Treatment description:  The patient was instructed in lymphedema risk reduction tools, including wear of compression garment for moderate to high risk activities, as well as skin care to prevent infection.  Pt was instructed in the benefit of fitting compression garments to involved extremity for wear with exercise, air travel, repetitive UE activities, and intermittently while receiving radiation therapy.  Pt instructed in recommendation of having compression garments fit in clinic to ensure appropriate fit and appropriate wear/care instructions.  Pt will be  instructed in lymphedema specific exercise program upon fit of compression garments.  Pt advised to notify clinic of any concerns that arise regarding lymphedema, encouraged to monitor upper arm due to 1 cm discrepancy comparing with uninvolved UE. Pt instructed in skin care, including cleansing skin daily involved quadrant/UE, and applying low pH lotion to extremity using upward strokes.  Measurements were completed for compression garment order, with order sent to vendor who will work with patient's insurance plan.  Pt advised to monitor L upper quadrant port site, seeking medical attention with s/s of infection, including progressive redness, warmth, fever, flu-like symptoms, with patient verbalizing understanding of instructions provided.  Treatment time:  11:42 am-12:00 pm  Minutes: 18 minutes        ASSESSMENT:   ERCELLE WINKLES is a 74 y.o. female who presents with stage 0 lymphedema, R UE, with 1 cm discrepancy axillary measurement, R UE>L UE.  Patient educated regarding lymphedema risk reduction, including fitting compression sleeve/gauntlet to be worn with high risk UE activities.  Pt is agreeable to proceeding with compression garment order, with garments to be fit in clinic upon receiving, with plan to establish wear schedule at that time.  Pt would benefit from visits to monitor R UE limb volumes prior/mid/conclusion of XRT.  Patient will receive instruction in proper skin care to recognize signs/symptoms of and prevent infection, therapeutic exercise, and self-MLD for independent home program and restorative lymphatic performance.    This care is medically necessary due to the infection risk with lymphedema, and to improve functional activities.  CDT is necessary to resolve swelling to allow patient to return to wearing normal clothes/footwear, and prevent worsening of symptoms, such as venous stasis ulcerations, infections, or hospitalizations.  Patient will be independent  with home program strategies to allow improved ADL ability and mobility and to allow patient to return to greatest functional independence.    Rehabilitation potential is considered to be Excellent.  Factors which may influence rehabilitation potential include XRT to follow after chemotherapy.  Patient will benefit from 2-4 prn physical therapy visits over 10 weeks to optimize improvement in these areas.    PLAN OF CARE:   Recommendations and Planned Interventions:  Manual lymph drainage/complete decongestive therapy  Compression garment fitting/provision  Lymphedema therapeutic exercise  Self-care training  Education in skin care and lymphedema precautions  Self-MLD education per home program  Caregiver education as needed     GOALS  1. Order compression sleeve/gauntlet R UE.  2.  Fit compression garments upon receiving and establish effective wear schedule.  3.  Educate patient regarding lymphedema risk reduction practices.     In compliance with CMS???s Claims Based Outcome Reporting, the following G-code set was chosen for this patient based on their primary functional limitation being treated:    The outcome measure chosen to determine the severity of the functional limitation was the SPADI with a score of 0/130 which was correlated with the impairment scale.    ? Other PT/OT Primary Functional Limitations:    [id number] - CURRENT STATUS: CH - 0% impaired, limited or restricted   G8991 - GOAL STATUS:  CH - 0% impaired, limited or restricted   Y8657 - D/C STATUS:  ---------------To be determined---------------     Patient has participated in goal setting and agrees to work toward plan of care.  Patient was instructed to call if questions or concerns arise.    Thank you for this referral.  Kalman Shan, PT, CLT    Time Calculation: 50 mins    TREATMENT PLAN EFFECTIVE DATES:   12/29/2014 TO 03/25/2015  I have read the above plan of care for Jillian Bean.  I certify the  above prescribed services are required by this patient and are medically necessary.  The above plan of care has been developed in conjunction with the lymphedema/physical therapist.       Physician Signature: ____________________________________Date:______________

## 2014-12-30 NOTE — Telephone Encounter (Signed)
Patient left voicemail stating that she saw Dr. Elie Confer today and wanted to report back to Dr. Laurena Bering that everything around the port site is fine.

## 2015-01-06 MED ORDER — DEXAMETHASONE SODIUM PHOSPHATE 4 MG/ML IJ SOLN
4 mg/mL | Freq: Once | INTRAMUSCULAR | Status: AC | PRN
Start: 2015-01-06 — End: 2015-01-11
  Administered 2015-01-11: 18:00:00 via INTRAVENOUS

## 2015-01-06 MED ORDER — SODIUM CHLORIDE 0.9 % IV
INTRAVENOUS | Status: AC
Start: 2015-01-06 — End: 2015-01-11

## 2015-01-06 MED ORDER — SODIUM CHLORIDE 0.9 % IV
10 mg/mL | Freq: Once | INTRAVENOUS | Status: DC
Start: 2015-01-06 — End: 2015-01-11

## 2015-01-06 MED ORDER — PALONOSETRON 0.25 MG/5 ML IV
0.25 mg/5 mL | Freq: Once | INTRAVENOUS | Status: AC
Start: 2015-01-06 — End: 2015-01-11
  Administered 2015-01-11: 17:00:00 via INTRAVENOUS

## 2015-01-06 MED ORDER — DOCETAXEL 20 MG/2 ML (10 MG/ML) IV SOLN
20210 mg/2 mL (10 mg/mL) | Freq: Once | INTRAVENOUS | Status: AC
Start: 2015-01-06 — End: 2015-01-11
  Administered 2015-01-11: 20:00:00 via INTRAVENOUS

## 2015-01-06 MED ORDER — OVERFILL VOLUME
440 mg | Freq: Once | INTRAVENOUS | Status: AC
Start: 2015-01-06 — End: 2015-01-11
  Administered 2015-01-11: 19:00:00 via INTRAVENOUS

## 2015-01-06 MED ORDER — PEGFILGRASTIM 6 MG/0.6 ML (DELIVERABLE) WEARABLE SUBCUTANEOUS INJECTOR
6 mg/0. mL | Freq: Once | SUBCUTANEOUS | Status: AC
Start: 2015-01-06 — End: 2015-01-11
  Administered 2015-01-11: 22:00:00 via SUBCUTANEOUS

## 2015-01-06 MED FILL — SODIUM CHLORIDE 0.9 % IV: INTRAVENOUS | Qty: 1000

## 2015-01-06 MED FILL — CARBOPLATIN 10 MG/ML IV SOLN: 10 mg/mL | INTRAVENOUS | Qty: 61.4

## 2015-01-07 MED ORDER — ALENDRONATE 70 MG TAB
70 mg | ORAL_TABLET | ORAL | Status: DC
Start: 2015-01-07 — End: 2015-02-01

## 2015-01-11 ENCOUNTER — Inpatient Hospital Stay: Admit: 2015-01-11 | Payer: MEDICARE | Primary: Internal Medicine

## 2015-01-11 ENCOUNTER — Ambulatory Visit: Admit: 2015-01-11 | Discharge: 2015-01-11 | Payer: MEDICARE | Attending: Acute Care | Primary: Internal Medicine

## 2015-01-11 DIAGNOSIS — C50911 Malignant neoplasm of unspecified site of right female breast: Secondary | ICD-10-CM

## 2015-01-11 LAB — CBC WITH AUTOMATED DIFF
ABS. BASOPHILS: 0 10*3/uL (ref 0.0–0.1)
ABS. EOSINOPHILS: 0 10*3/uL (ref 0.0–0.4)
ABS. LYMPHOCYTES: 1.6 10*3/uL (ref 0.8–3.5)
ABS. MONOCYTES: 0.8 10*3/uL (ref 0.0–1.0)
ABS. NEUTROPHILS: 6.8 10*3/uL (ref 1.8–8.0)
BASOPHILS: 0 % (ref 0–1)
EOSINOPHILS: 0 % (ref 0–7)
HCT: 31.1 % — ABNORMAL LOW (ref 35.0–47.0)
HGB: 10.3 g/dL — ABNORMAL LOW (ref 11.5–16.0)
LYMPHOCYTES: 17 % (ref 12–49)
MCH: 29.2 PG (ref 26.0–34.0)
MCHC: 33.1 g/dL (ref 30.0–36.5)
MCV: 88.1 FL (ref 80.0–99.0)
MONOCYTES: 9 % (ref 5–13)
NEUTROPHILS: 74 % (ref 32–75)
PLATELET: 213 10*3/uL (ref 150–400)
RBC: 3.53 M/uL — ABNORMAL LOW (ref 3.80–5.20)
RDW: 14.1 % (ref 11.5–14.5)
WBC: 9.2 10*3/uL (ref 3.6–11.0)

## 2015-01-11 LAB — POC CHEM8
Anion gap (POC): 15 mmol/L (ref 5–15)
BUN (POC): 16 MG/DL (ref 9–20)
CO2 (POC): 23 MMOL/L (ref 21–32)
Calcium, ionized (POC): 1.2 MMOL/L (ref 1.12–1.32)
Chloride (POC): 106 MMOL/L (ref 98–107)
Creatinine (POC): 0.8 MG/DL (ref 0.6–1.3)
GFRAA, POC: >60 mL/min/{1.73_m2} (ref >60)
GFRNA, POC: >60 mL/min/{1.73_m2} (ref >60)
Glucose (POC): 88 MG/DL (ref 65–105)
Hematocrit (POC): 30 % — ABNORMAL LOW (ref 35.0–47.0)
Hemoglobin (POC): 10.2 GM/DL — ABNORMAL LOW (ref 11.5–16.0)
Potassium (POC): 3.5 MMOL/L (ref 3.5–5.1)
Sodium (POC): 140 MMOL/L (ref 136–145)

## 2015-01-11 LAB — HEPATIC FUNCTION PANEL
A-G Ratio: 1.5 (ref 1.1–2.2)
ALT (SGPT): 35 U/L (ref 12–78)
AST (SGOT): 21 U/L (ref 15–37)
Albumin: 4 g/dL (ref 3.5–5.0)
Alk. phosphatase: 80 U/L (ref 45–117)
Bilirubin, direct: 0.1 MG/DL (ref 0.0–0.2)
Bilirubin, total: 0.3 MG/DL (ref 0.2–1.0)
Globulin: 2.6 g/dL (ref 2.0–4.0)
Protein, total: 6.6 g/dL (ref 6.4–8.2)

## 2015-01-11 MED ORDER — SODIUM CHLORIDE 0.9 % INJECTION
INTRAMUSCULAR | Status: AC | PRN
Start: 2015-01-11 — End: 2015-01-12
  Administered 2015-01-11 (×2): via INTRAVENOUS

## 2015-01-11 MED ORDER — HEPARIN, PORCINE (PF) 100 UNIT/ML IV SYRINGE
100 unit/mL | INTRAVENOUS | Status: AC | PRN
Start: 2015-01-11 — End: 2015-01-12
  Administered 2015-01-11: 22:00:00 via INTRAVENOUS

## 2015-01-11 MED ORDER — CARBOPLATIN 10 MG/ML IV SOLN
10 mg/mL | Freq: Once | INTRAVENOUS | Status: AC
Start: 2015-01-11 — End: 2015-01-11
  Administered 2015-01-11: 21:00:00 via INTRAVENOUS

## 2015-01-11 MED ORDER — SODIUM CHLORIDE 0.9 % IJ SYRG
Freq: Three times a day (TID) | INTRAMUSCULAR | Status: AC
Start: 2015-01-11 — End: 2015-01-11
  Administered 2015-01-11 (×3): via INTRAVENOUS

## 2015-01-11 MED ORDER — SODIUM CHLORIDE 0.9 % IV
INTRAVENOUS | Status: AC | PRN
Start: 2015-01-11 — End: 2015-01-12
  Administered 2015-01-11: 17:00:00 via INTRAVENOUS

## 2015-01-11 MED FILL — CARBOPLATIN 10 MG/ML IV SOLN: 10 mg/mL | INTRAVENOUS | Qty: 55.5

## 2015-01-11 MED FILL — DEXAMETHASONE SODIUM PHOSPHATE 4 MG/ML IJ SOLN: 4 mg/mL | INTRAMUSCULAR | Qty: 3

## 2015-01-11 MED FILL — SODIUM CHLORIDE 0.9 % INJECTION: INTRAMUSCULAR | Qty: 10

## 2015-01-11 MED FILL — HERCEPTIN 440 MG INTRAVENOUS SOLUTION: 440 mg | INTRAVENOUS | Qty: 423

## 2015-01-11 MED FILL — DOCETAXEL 20 MG/2 ML (10 MG/ML) IV SOLN: 20 mg/2 mL (10 mg/mL) | INTRAVENOUS | Qty: 13.6

## 2015-01-11 MED FILL — ALOXI 0.25 MG/5 ML INTRAVENOUS SOLUTION: 0.25 mg/5 mL | INTRAVENOUS | Qty: 1

## 2015-01-11 MED FILL — NEULASTA ONPRO 6 MG/0.6 ML WITH WEARABLE SUBCUTANEOUS INJECTOR: 6 mg/0. mL | SUBCUTANEOUS | Qty: 1

## 2015-01-11 MED FILL — SODIUM CHLORIDE 0.9 % IV: INTRAVENOUS | Qty: 1000

## 2015-01-11 NOTE — Progress Notes (Signed)
Jillian Bean is a 74 y.o. female  Chief Complaint   Patient presents with   ??? Breast Cancer

## 2015-01-11 NOTE — Progress Notes (Signed)
[facility] - Chemotherapy Progress Note    1100 Pt admit to [facility] for C 2 Jillian Bean ambulatory in stable condition. Assessment completed. No new concerns voiced. PAC with positive blood return.    BP 140/83 mmHg   Pulse 76   Temp(Src) 97.2 ??F (36.2 ??C)   Resp 18   Ht 5' 6" (1.676 m)   Wt 70.398 kg (155 lb 3.2 oz)   BMI 25.06 kg/m2   SpO2 97%   LMP 04/13/2010  Pt had MD appointment @ 1115 and returned @ 1245  Rn contacted MD office for new carbo dose- outside 10 %- orders recieved    Medications:  aloxi  Dexamethasone  herceptin  Docetaxel  Carboplatin  OBJ Neulasta    5364 Pt tolerated treatment well. PAC maintained positive blood return throughout treatment, flushed with positive blood return at conclusion and throughout treatment . D/c home ambulatory in no distress. Pt aware of next appointment scheduled for 02/01/15 @  9 am.    Recent Results (from the past 12 hour(s))   HEPATIC FUNCTION PANEL    Collection Time: 01/11/15 11:16 AM   Result Value Ref Range    Protein, total 6.6 6.4 - 8.2 g/dL    Albumin 4.0 3.5 - 5.0 g/dL    Globulin 2.6 2.0 - 4.0 g/dL    A-G Ratio 1.5 1.1 - 2.2      Bilirubin, total 0.3 0.2 - 1.0 MG/DL    Bilirubin, direct 0.1 0.0 - 0.2 MG/DL    Alk. phosphatase 80 45 - 117 U/L    AST 21 15 - 37 U/L    ALT 35 12 - 78 U/L   POC CHEM8    Collection Time: 01/11/15 11:29 AM   Result Value Ref Range    Calcium, ionized (POC) 1.20 1.12 - 1.32 MMOL/L    Sodium (POC) 140 136 - 145 MMOL/L    Potassium (POC) 3.5 3.5 - 5.1 MMOL/L    Chloride (POC) 106 98 - 107 MMOL/L    CO2 (POC) 23 21 - 32 MMOL/L    Anion gap (POC) 15 5 - 15 mmol/L    Glucose (POC) 88 65 - 105 MG/DL    BUN (POC) 16 9 - 20 MG/DL    Creatinine (POC) 0.8 0.6 - 1.3 MG/DL    GFR-AA (POC) >60 >60 ml/min/1.67m    GFR, non-AA (POC) >60 >60 ml/min/1.758m   Hemoglobin (POC) 10.2 (L) 11.5 - 16.0 GM/DL    Hematocrit (POC) 30 (L) 35.0 - 47.0 %    Comment Comment Not Indicated.     CBC WITH AUTOMATED DIFF     Collection Time: 01/11/15 12:01 PM   Result Value Ref Range    WBC 9.2 3.6 - 11.0 K/uL    RBC 3.53 (L) 3.80 - 5.20 M/uL    HGB 10.3 (L) 11.5 - 16.0 g/dL    HCT 31.1 (L) 35.0 - 47.0 %    MCV 88.1 80.0 - 99.0 FL    MCH 29.2 26.0 - 34.0 PG    MCHC 33.1 30.0 - 36.5 g/dL    RDW 14.1 11.5 - 14.5 %    PLATELET 213 150 - 400 K/uL    NEUTROPHILS 74 32 - 75 %    LYMPHOCYTES 17 12 - 49 %    MONOCYTES 9 5 - 13 %    EOSINOPHILS 0 0 - 7 %    BASOPHILS 0 0 - 1 %    ABS. NEUTROPHILS 6.8  1.8 - 8.0 K/UL    ABS. LYMPHOCYTES 1.6 0.8 - 3.5 K/UL    ABS. MONOCYTES 0.8 0.0 - 1.0 K/UL    ABS. EOSINOPHILS 0.0 0.0 - 0.4 K/UL    ABS. BASOPHILS 0.0 0.0 - 0.1 K/UL

## 2015-01-11 NOTE — Progress Notes (Signed)
[facility]  [address] [place]   [address]  W: [phone]   F: [phone]      F/u HEME/ONC CONSULT      Reason for visit:  evaluation for treatment for breast cancer    Consulting physician:  Dr. Gilford Rile    HPI:   Jillian Bean is a 74 y.o.  female who I was asked to see in consultation at the request of Dr. Elie Confer for evaluation for systemic therapy for breast cancer.    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  Right lumpectomy on 11/10/14 shows IDC, 1.7 cm, gr 3, 0/5 LN, no LVI.  PT1cN0Mx.    TCH: 12/21/14-     Interval History:  In today for [place] #2.  Port placed yesterday, tolerated well.  She was seen by Dr. Marlon Pel on 12/16/14.  She took steroids yesterday, noted some flushing yesterday and today.  She is feeling well today.  Complains of gr 2 loss of appetite, gr 2 nosebleed, gr 1 constipation, gr 1 diarrhea, gr 1 fatigue, gr 1 insomnia    DX   Encounter Diagnosis   Name Primary?   ??? Malignant neoplasm of right female breast, unspecified site of breast ([place]) Yes            Past Medical History   Diagnosis Date   ??? Plantar fasciitis 09/01/2007   ??? Colonic polyps 08/31/1998   ??? OA (osteoarthritis) 09/01/2007   ??? Back pain 09/01/2007   ??? Hypercholesteremia 09/01/2007   ??? Hypothyroidism 09/01/2003   ??? S/P colonoscopy 10/10/2007   ??? 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 Hiatal hernia 07/11/2011     Dr.sobieski   ??? 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 Thyroid disease    ??? Coagulation disorder (HCC)      IRON DEFFICIENCY ANEMIA   ??? Nausea & vomiting    ??? Other ill-defined conditions(799.89)      VERTIGO   ??? Cancer [facility])      R breast     Past Surgical History   Procedure Laterality Date   ??? Hx gyn       Hysterectomy in her 80's    ??? Pr breast surgery procedure unlisted  1993     Breast Reduction   ??? Endoscopy, colon, diagnostic  7/12/18/08     7/05 Dr Maudry Diego   ??? Hx other surgical       MINIMALLY INVASIVE LUMBAR DECOMPRESSION   ??? Hx gi       COLONOSCOPY   ??? Hx heent  04/07/10     thyroid biopsy neg dr Leward Quan   ??? Hx heent       wisdom teeth extraction   ??? Hx orthopaedic  08/22/09     left TKR   ??? Hx orthopaedic  08/13/12     right tkr     History     Social History   ??? Marital Status: MARRIED     Spouse Name: N/A   ??? Number of Children: N/A   ??? Years of Education: N/A     Social History Main Topics   ??? Smoking status: Never Smoker    ??? Smokeless  tobacco: Never Used   ??? Alcohol Use: No   ??? Drug Use: No   ??? Sexual Activity: Not Currently     Other Topics Concern   ??? None     Social History Narrative     Family History   Problem Relation Age of Onset   ??? Cancer Mother      Breast   ??? Heart Failure Father      MI   ??? Seizures Brother      ms       Current Outpatient Prescriptions   Medication Sig Dispense Refill   ??? docusate sodium (COLACE) 100 mg capsule Take 100 mg by mouth daily.     ??? meloxicam (MOBIC) 7.5 mg tablet TAKE 1 TABLET BY MOUTH DAILY 90 Tab 0   ??? 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 latanoprost (XALATAN) 0.005 % ophthalmic solution   6   ??? lidocaine-prilocaine (EMLA) topical cream Apply  to affected area as needed for Pain. 30 g 0   ??? dexamethasone (DECADRON) 4 mg tablet 8 mg twice a day the day before and day after chemo 32 Tab 3   ??? alendronate (FOSAMAX) 70 mg tablet Take 1 Tab by mouth Every Thursday. 4 Tab 3   ??? colesevelam (WELCHOL) 625 mg tablet 1 po bid 180 Tab 2   ??? levothyroxine (SYNTHROID) 112 mcg tablet 8 per week 100 Tab 3   ??? aspirin 81 mg chewable tablet Take 81 mg by mouth daily.     ??? multivitamin (ONE A DAY) tablet Take 1 Tab by mouth daily.     ??? cholecalciferol, vitamin d3, (VITAMIN D) 1,000 unit tablet Take  by mouth daily.      ??? alendronate (FOSAMAX) 70 mg tablet TAKE 1 TABLET BY MOUTH EVERY THURSDAY 4 Tab 0   ??? magic mouthwash solution Magic mouth wash   Maalox  Lidocaine 2% viscous   Diphenhydramine oral solution   Pharmacy to mix equal portions of ingredients to a total volume as indicated in the dispense amount.  Sig: Swish and spit 15-30 ml every 2-4 hours as needed for mouth pain. Ok to swallow for throat pain. 500 mL 2   ??? ondansetron hcl (ZOFRAN) 8 mg tablet Take 1 Tab by mouth every eight (8) hours as needed for Nausea. 24 Tab 3   ??? prochlorperazine (COMPAZINE) 10 mg tablet Take 1 Tab by mouth every six (6) hours as needed for Nausea. 50 Tab 5     Facility-Administered Medications Ordered in Other Visits   Medication Dose Route Frequency Provider Last Rate Last Dose   ??? heparin (porcine) pf 500 Units  500 Units IntraVENous PRN Joyce Gross, MD       ??? sodium chloride (NS) flush 10 mL  10 mL IntraVENous Q8H Joyce Gross, MD       ??? 0.9% sodium chloride infusion  50 mL/hr IntraVENous PRN Joyce Gross, MD       ??? sodium chloride 0.9 % injection 10 mL  10 mL IntraVENous PRN Joyce Gross, MD       ??? 0.9% sodium chloride infusion  25 mL/hr IntraVENous CONTINUOUS Joyce Gross, MD       ??? palonosetron HCl (ALOXI) injection 0.25 mg  0.25 mg IntraVENous ONCE Joyce Gross, MD       ??? dexamethasone (DECADRON) 4 mg/mL injection 12 mg  12 mg IntraVENous ONCE PRN Joyce Gross,  MD       ??? pegfilgrastim (NEULASTA) wearable SQ injector 6 mg  6 mg SubCUTAneous ONCE Joyce Gross, MD       ??? trastuzumab (HERCEPTIN) 423 mg in 0.9% sodium chloride 250 mL, overfill volume 25 mL IVPB  423 mg IntraVENous ONCE Joyce Gross, MD       ??? DOCEtaxel (TAXOTERE) 136 mg in 0.9% sodium chloride 250 mL, overfill volume 25 mL chemo infusion  136 mg IntraVENous ONCE Joyce Gross, MD       ??? CARBOplatin (PARAPLATIN) 614 mg in 0.9% sodium chloride 500 mL, overfill volume 50 mL chemo infusion  614 mg IntraVENous ONCE Joyce Gross, MD            Allergies   Allergen Reactions   ??? Zocor [Simvastatin] Other (comments) and Diarrhea     Other reaction(s): Adverse reaction to substance   Myalgias     ??? Celebrex [Celecoxib] Other (comments)     Aggitation   ??? Gabapentin Other (comments)     FELT UNSTEADY ON MY FEET   ??? Levaquin [Levofloxacin] Palpitations   ??? Nexium [Esomeprazole Magnesium] Diarrhea   ??? Oxycontin [Oxycodone] Itching   ??? Pravastatin Nausea and Vomiting   ??? Yellow Dye Hives       Review of Systems    A comprehensive review of systems was performed and all systems were negative except for HPI and for the symptom report form, reviewed and scanned in.        Objective:  Physical Exam:  BP 140/83 mmHg   Pulse 76   Temp(Src) 97.2 ??F (36.2 ??C) (Temporal)   Resp 18   Ht '5\' 6"'  (1.676 m)   Wt 155 lb 3.2 oz (70.398 kg)   BMI 25.06 kg/m2   SpO2 97%   LMP 04/13/2010    General:  Alert, cooperative, no distress, appears stated age.   Head:  Normocephalic, without obvious abnormality, atraumatic.   Eyes:  Conjunctivae/corneas clear. PERRL, EOMs intact.   Throat: Lips, mucosa, and tongue normal.    Neck: Supple, symmetrical, trachea midline, no adenopathy, thyroid: no enlargement/tenderness/nodules   Back:   Symmetric, no curvature. ROM normal. No CVA tenderness.   Lungs:   Clear to auscultation bilaterally.   Chest wall:  No tenderness or deformity.   Heart:  Regular rate and rhythm, S1, S2 normal, no murmur, click, rub or gallop.   Breast Exam:  S/p R lumpectomy.    Abdomen:   Soft, non-tender. Bowel sounds normal. No masses,  No organomegaly.   Extremities: Extremities normal, atraumatic, no cyanosis or edema.   Skin: Skin color, texture, turgor normal. No rashes or lesions.   Lymph nodes: Cervical, supraclavicular, and axillary nodes normal.   Neurologic: CNII-XII intact.     Diagnostic Imaging   Results for orders placed during the hospital encounter of 09/03/12   XR KNEE RT MAX 2 VWS    Narrative **Final Report**        ICD Codes / Adm.Diagnosis: 780.4  70 / DJD RIGHT KNEE  DJD RIGHT KNEE  Examination:  CR KNEE MAX 2 VWS RT  - [id number] - Sep 03 2012 12:18PM  Accession No:  [id number]  Reason:  post-op film      REPORT:  INDICATION:  Postop.    COMPARISON:  No old study.    FINDINGS: Portable AP and lateral views of the right knee obtained in the   PACU show a total knee replacement in satisfactory  position.  Postoperative   change in the soft tissues is present.        IMPRESSION:   Postop change right knee replacement surgery.                          Signing/Reading Doctor: Milus Glazier [phone])    Approved: Milus Glazier ([id number])  Sep 03 2012 12:31PM                                Results for orders placed during the hospital encounter of 08/13/12   CT HEAD WO CONT    Narrative **Final Report**       ICD Codes / Adm.Diagnosis: 100002  70 / Dizziness  Nausea  Examination:  CT HEAD WO CON  - [id number] - Aug 13 2012  1:03AM  Accession No:  [id number]  Reason:  Pain      REPORT:  INDICATION:   Pain    EXAM:  HEAD CT WITHOUT CONTRAST    COMPARISON:  None    TECHNIQUE:  Routine noncontrast axial head CT was performed.    FINDINGS:    The ventricles are midline without hydrocephalus.  There is no acute intra   or extra-axial hemorrhage.  There is no significant white matter disease.    The basal cisterns are patent. The paranasal sinuses are clear.        IMPRESSION:    No acute process.                  Signing/Reading Doctor: Leonie Green LEWIS [id number])    Approved: Leonie Green LEWIS [id number])  Aug 13 2012  1:07AM                                   12/16/14 TTE: EF 71%.    Lab Results  Lab Results   Component Value Date/Time    WBC 9.2 01/11/2015 12:01 PM    HGB 10.3 01/11/2015 12:01 PM    HCT 31.1 01/11/2015 12:01 PM    PLATELET 213 01/11/2015 12:01 PM    MCV 88.1 01/11/2015 12:01 PM       Lab Results   Component Value Date/Time    SODIUM 140 12/21/2014 09:23 AM    POTASSIUM 4.0 12/21/2014 09:23 AM    CHLORIDE 105 12/21/2014 09:23 AM     CO2 26 12/21/2014 09:23 AM    ANION GAP 9 12/21/2014 09:23 AM    GLUCOSE 93 12/21/2014 09:23 AM    BUN 17 12/21/2014 09:23 AM    CREATININE 0.78 12/21/2014 09:23 AM    BUN/CREATININE RATIO 22 12/21/2014 09:23 AM    GFR EST AA >60 12/21/2014 09:23 AM    GFR EST NON-AA >60 12/21/2014 09:23 AM    CALCIUM 8.5 12/21/2014 09:23 AM    ALT 23 12/21/2014 09:23 AM    AST 18 12/21/2014 09:23 AM    ALK. PHOSPHATASE 86 12/21/2014 09:23 AM    PROTEIN, TOTAL 6.6 12/21/2014 09:23 AM    ALBUMIN 3.9 12/21/2014 09:23 AM    GLOBULIN 2.7 12/21/2014 09:23 AM    A-G RATIO 1.4 12/21/2014 09:23 AM       .    Assessment/Plan:  74 y.o. 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  PS 0    1. Right Breast cancer stage: IA    Hormonal therapy: not indicated due to receptor (-) status    We explained to the patient that the goal of systemic adjuvant therapy is to improve the chances for cure and decrease the risk of relapse. We explained why a patient can have microscopic cancer spread now even though physical examination, laboratory studies and imaging studies are negative for cancer. We explained that the same treatments used now as adjuvant or preventive treatments rarely if ever are curative in women who develop metastases.     She is in excellent health and thus an adjuvant discussion is warranted.  trastuzumab based therapy corresponds to a 9% overall survival benefit, which is rather larger overall (that corresponds to a nearly 20% DFS benefit).    [id number] suggests equivalency between q.3 week Adriamycin, Cytoxan followed by weekly paclitaxel and trastuzumab compared with the [facility] regimen. However, this study was not powered to show a difference between these two regimens, and in the NEJM publication, the AC-TH arm showed a numerically advantange (though not statistically significant) to the [facility] arm.?? In this patient, it is completely reasonable to use [facility] approach and  avoid the potential cardiotoxicity of the anthracyclines as well as the potential for leukemia.    We discussed the toxicities of docetaxel and carboplatin chemotherapy in detail.?? This chemotherapy frequently causes a low white blood cell count and hospital admissions for treatment of neutropenic fever.?? We explained that we consider the use of growth factors to minimize this risk.?? We explained to the patient that some side effects if they occur only last a few days including nausea, vomiting, stomatitis, arthralgia, myalgia,and allergic reactions to Taxotere.?? We told the patient that severe nausea and vomiting were uncommon and that some side effects,if they occur, will last longer; this includes hair loss, which will be seen in all patients treated with these agents and fatigue,which will be seen in most.?? We also informed that for the patient that heart damage is rare with these agents.?? We explained that carboplatin can rarely cause kidney damage and high frequency hearing loss.?? We provided the patient in detail her information concerning the toxicities of this regimen in addition to her overall discussion.??     Rationale for therapy with trastuzumab was also discussed with the patient including a 50% proportional improvement in disease free survival and also an improvement in overall survival in patients receiving trastuzumab and chemotherapy for HER-2 positive breast cancer.?? The side effects of trastuzumab were discussed including a 4%-5% risk of dropping her ejection fraction while on treatment and about a 1% risk of CHF.?? We discussed that this drug will be used every 3 weeks for remainder of a year following the chemotherapy cycles.?? We will check her EF before chemotherapy and every 3 months while she is receiving trastuzumab.    She is not a candidate to receive pertuzumab with her stage I disease.    We also discussed the APT study which is 12 weeks of weekly paclitaxel with trastuzumab.       After this discussion, she is agreeable to neoadjuvant TCH q 3 weeks x 6 followed by outback trastuzumab and pertuzumab for the completion of a year of therapy (docetaxel 75 mg/m2; carboplatin auc 6; trastuzumab 50m/kg load then 6 mg/kg).??    Informed consent signed today, patient provided with copy.    The patient was given presciptions for emla cream, decadron to take 8 mg  bid the day before and day after chemo, zofran and compazine.?? Neulasta?? the following day.    TTE ordered and cardiology referral made, seen by Dr. Marlon Pel on 12/16/14.?? Port placed by Dr. Elie Confer on 12/20/14.     Regino Ramirez #2 today, will see her back in 3 weeks for Cycle #3.     2. Emotional well being:  She has excellent support and is coping well with her disease.    3. FH of breast cancer:  Mother with breast cancer at age 41; father's sister with breast cancer in her 56s.   discussed ramifications of a positive test including the need for bilateral mastectomies and bilateral oophorectomies and the risk then for her family members to also have a mutation. Will do ambry testing today, patient is willing to pay for this possibly.  She does not have living children, but desires this info for her nieces.    4. Constipation: Gr 1 present at baseline, she is currently taking a stool softener.  Constipation handout provided and reviewed previously.     5. Anemia:  Mild; due to chemo; will monitor; new    6. Insomnia:  Due to steroids, will monitor    7. Nosebleed:  Due to dry mucous membranes; vaseline prn    Thank you for this consult.  All of the patient's questions were answered today.    > 25 minutes were spent with this patient with > 50% of that time spent in face to face counseling.            Follow-up Disposition:  Return in 3 weeks (on 02/01/2015).    Sonda Rumble, MD

## 2015-01-11 NOTE — Progress Notes (Signed)
Problem: Chemotherapy Treatment  Goal: *Chemotherapy regimen followed  Outcome: Progressing Towards Goal  C 2

## 2015-01-17 NOTE — Telephone Encounter (Signed)
Patient left voicemail stating that she has been experiencing stomach pain and diarrhea and would like to know if there is anything she could take for it. CB# is [phone].

## 2015-01-17 NOTE — Telephone Encounter (Addendum)
Called and spoke to patient, who states that first, she was having episodes of constipation and was taking Miralax, but states that for the last four days she has been experiencing diarrhea.  Patient states she has been averaging 8-9 liquid stools/day, and has had four so far today.  Patient denies any fevers, nausea, vomiting, or dizziness, and states that she has been drinking "lots of fluids."  Patient states she hasn't tried any medication for the diarrhea.  Advised patient that per Dr. Laurena Bering, to take Immodium two tablets with the first liquid stool of the day, and then one tablet after each subsequent liquid stool.  Advised patient that we could bring her in for IV fluids if needed for dehydration.  Patient states she doesn't feel dehydrated at this time.  Advised patient to call office tomorrow to let us know how she was doing, or to call sooner if symptoms worsened.  Patient voices understanding and denies any further questions at this time.

## 2015-01-17 NOTE — Telephone Encounter (Signed)
Patient left a voicemail stating she would like to know if we could prescribe her something for diarrhea. Call back number [phone]

## 2015-01-20 NOTE — Telephone Encounter (Signed)
Patient left a voicemail stating the Imodium has helped her and she is feeling better. No call back necessary.

## 2015-01-27 MED ORDER — PALONOSETRON 0.25 MG/5 ML IV
0.25 mg/5 mL | Freq: Once | INTRAVENOUS | Status: AC
Start: 2015-01-27 — End: 2015-02-01
  Administered 2015-02-01: 15:00:00 via INTRAVENOUS

## 2015-01-27 MED ORDER — SODIUM CHLORIDE 0.9 % IV
10 mg/mL | Freq: Once | INTRAVENOUS | Status: DC
Start: 2015-01-27 — End: 2015-02-01

## 2015-01-27 MED ORDER — PEGFILGRASTIM 6 MG/0.6 ML (DELIVERABLE) WEARABLE SUBCUTANEOUS INJECTOR
6 mg/0. mL | Freq: Once | SUBCUTANEOUS | Status: AC
Start: 2015-01-27 — End: 2015-02-01
  Administered 2015-02-01: 19:00:00 via SUBCUTANEOUS

## 2015-01-27 MED ORDER — DOCETAXEL 20 MG/2 ML (10 MG/ML) IV SOLN
20 mg/2 mL (10 mg/mL) | Freq: Once | INTRAVENOUS | Status: AC
Start: 2015-01-27 — End: 2015-02-01
  Administered 2015-02-01: 16:00:00 via INTRAVENOUS

## 2015-01-27 MED ORDER — DEXAMETHASONE SODIUM PHOSPHATE 4 MG/ML IJ SOLN
4 mg/mL | Freq: Once | INTRAMUSCULAR | Status: AC | PRN
Start: 2015-01-27 — End: 2015-02-01
  Administered 2015-02-01: 15:00:00 via INTRAVENOUS

## 2015-01-27 MED ORDER — SODIUM CHLORIDE 0.9 % IV
440 mg | Freq: Once | INTRAVENOUS | Status: AC
Start: 2015-01-27 — End: 2015-02-01
  Administered 2015-02-01: 16:00:00 via INTRAVENOUS

## 2015-01-27 MED ORDER — SODIUM CHLORIDE 0.9 % IV
INTRAVENOUS | Status: AC
Start: 2015-01-27 — End: 2015-02-01
  Administered 2015-02-01: 15:00:00 via INTRAVENOUS

## 2015-01-27 MED FILL — SODIUM CHLORIDE 0.9 % IV: INTRAVENOUS | Qty: 1000

## 2015-01-27 MED FILL — CARBOPLATIN 10 MG/ML IV SOLN: 10 mg/mL | INTRAVENOUS | Qty: 55.5

## 2015-01-27 NOTE — Telephone Encounter (Signed)
Call to pt, ID verified x2. Per Dr. Laurena Bering request, negative results for BRCA1/BRCA2 reviewed with patient. Pt expressed understanding with no further questions or concerns.

## 2015-01-28 ENCOUNTER — Inpatient Hospital Stay: Admit: 2015-01-28 | Payer: MEDICARE | Primary: Internal Medicine

## 2015-01-28 DIAGNOSIS — I890 Lymphedema, not elsewhere classified: Secondary | ICD-10-CM

## 2015-01-28 NOTE — Progress Notes (Signed)
[facility]  Physical Therapy Lymphedema Clinic  [place], [place]  [address]    LYmphedema Therapy  Visit: 2                      Daily note                                30 day progress note    NAME: Jillian Bean  DATE: 01/28/2015    GOALS  1. Order compression sleeve/gauntlet R UE.  Pt has received Juzo arm sleeve and gauntlet, 1 set to date from [place].  2.?? Fit compression garments upon receiving and establish effective wear schedule.  Juzo 20-59mHg garments fitted today and pt tolerated well during exercise set and wore out of clinic.  Instructed to wear garments for repetitive activities, exercise, airplane travel, heavy housework and yardwork.  Pt may need to increase to daily wear once radiation txs begin.  3.?? Educate patient regarding lymphedema risk reduction practices.??Goal met 01/28/15.       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                                Gait:  Independent.  ADLs:  Independent.  Treatment Response:  Pt has chemo every 3 weeks on Tuesday, scheduled for next week.  Radiation to begin in August.  She tried garments on at home and wants [place] make sure they are the correct length.  Reviewed packet received at evaluation including skin care products, arm drainage exercises, self MLD handout and resource for community based MLD CMT, and future compression needs should they arise.  Function: Independent.  Pt performing skin care and household skills independently and instructed in garment don/doff, exercises and self MLD today.    TREATMENT AND OBJECTIVE DATA SUMMARY:   Patient/Family Education:      Educated in skin care: Pt demonstrates good skin care principles     Educated in exercise:   Instructed in 3 decongestive exercise routines to alternate 3-7x/week at  home.     Instructed in self MLD:   Written sequence given and reviewed with patient as well as demonstration and instruction during MLD portion of the session.     Instructed in don/doff of compression system:   Pt able to don/doff RUE arm sleeve and gauntlet using donning glove on L hand with verbal cues.     Therapeutic Activity 0 minutes   Treatment time:   Functional Mobility: Modified independent.   Fall risk: Normal     Therapeutic Exercise/Procedure 35 minutes   Treatment time: 1:15-1:50pm  Gertie ball exercise program: Demonstration by therapist of written routine.  Patient instructed where to buy Gertie ball.   Stick exercise program: Therapist demonstrated this routine to pt and pt given written handout.   Free exercises/ROM: Pt performed this routine 5x each with verbal instruction and good form.   Home program: Patient to perform daily to BID skin care, deep abdominal breathing, decongestive exercise routine, and wear compression sleeve and gauntlet as needed.   Rationale: Exercises will increase the lymph angiomotoricity and tissue pressure of the skin and thus decrease  swelling along with compression.     Modalities 0 minutes   Treatment time:   Vasopneumatic pump: Not necessary at this point.     Manual Lymphatic Drainage (MLD) 35 minutes   Treatment time: 12:40-1:15pm  Area to decongest: RUE   Sequence used and effectiveness: Secondary pathway to drain RUE and chest.  Pt responds well and her volumes have decreased in the RUE since last eval.   Skin/wound care/debridement: WNLs   Upper extremity compression: 20-83mHg Juzo arm sleeve size II Max long and 41M gauntlet.   Kinesiotaping: NA   Girth/Volume measurement: Reviewed results of volumetric measurements taken on evaluation and improvement noted this visit.  No palpable lymphedema noted.  RUE 3% less than LUE and pt is R hand dominant.     TOTAL TREATMENT 70 mins     Circumferential Limb Measurements:  Affected Side: RUE at risk   Left (cm)    Palm 18.8      Wrist 16.2      Elbow 25.3      Axilla 29.2      Length 46            ASSESSMENT:   Treatment effectiveness and tolerance: Pt very alert and cooperative with education regarding lymphedema and fitting of new garments and exercise instruction.   Progress toward goals: Nearing all goals in a timely manner.  Pt uses treadmill and bike at home and instructed to try gentle yoga.     PLAN OF CARE:   Changes to the plan of care: Continue final instruction in lymphedema management and follow up after radiation therapy.   Frequency: One more visit then 6 month reevals.           Ilse Billman A Cale Decarolis, PT, CLT

## 2015-02-01 ENCOUNTER — Ambulatory Visit: Admit: 2015-02-01 | Discharge: 2015-02-01 | Payer: MEDICARE | Attending: Acute Care | Primary: Internal Medicine

## 2015-02-01 ENCOUNTER — Inpatient Hospital Stay: Admit: 2015-02-01 | Payer: MEDICARE | Primary: Internal Medicine

## 2015-02-01 DIAGNOSIS — C50911 Malignant neoplasm of unspecified site of right female breast: Secondary | ICD-10-CM

## 2015-02-01 DIAGNOSIS — Z5111 Encounter for antineoplastic chemotherapy: Secondary | ICD-10-CM

## 2015-02-01 LAB — HEPATIC FUNCTION PANEL
A-G Ratio: 1.9 (ref 1.1–2.2)
ALT (SGPT): 29 U/L (ref 12–78)
AST (SGOT): 18 U/L (ref 15–37)
Albumin: 4 g/dL (ref 3.5–5.0)
Alk. phosphatase: 87 U/L (ref 45–117)
Bilirubin, direct: 0.1 MG/DL (ref 0.0–0.2)
Bilirubin, total: 0.3 MG/DL (ref 0.2–1.0)
Globulin: 2.1 g/dL (ref 2.0–4.0)
Protein, total: 6.1 g/dL — ABNORMAL LOW (ref 6.4–8.2)

## 2015-02-01 LAB — CBC WITH 3 PART DIFF
ABS. LYMPHOCYTES: 1.1 10*3/uL (ref 0.8–3.5)
ABS. MIXED CELLS: 0.7 10*3/uL (ref 0.2–1.2)
ABS. NEUTROPHILS: 6 10*3/uL (ref 1.8–8.0)
HCT: 31.3 % — ABNORMAL LOW (ref 35.0–47.0)
HGB: 10.1 g/dL — ABNORMAL LOW (ref 11.5–16.0)
LYMPHOCYTES: 14 % (ref 12–49)
MCH: 29.5 PG (ref 26.0–34.0)
MCHC: 32.3 g/dL (ref 30.0–36.5)
MCV: 91.5 FL (ref 80.0–99.0)
Mixed cells: 9 % (ref 3.2–16.9)
NEUTROPHILS: 77 % — ABNORMAL HIGH (ref 32–75)
PLATELET: 162 10*3/uL (ref 150–400)
RBC: 3.42 M/uL — ABNORMAL LOW (ref 3.80–5.20)
RDW: 16.4 % — ABNORMAL HIGH (ref 11.8–15.8)
WBC: 7.8 10*3/uL (ref 3.6–11.0)

## 2015-02-01 LAB — POC CHEM8
Anion gap (POC): 16 mmol/L — ABNORMAL HIGH (ref 5–15)
BUN (POC): 20 MG/DL (ref 9–20)
CO2 (POC): 21 MMOL/L (ref 21–32)
Calcium, ionized (POC): 1.22 MMOL/L (ref 1.12–1.32)
Chloride (POC): 107 MMOL/L (ref 98–107)
Creatinine (POC): 0.8 MG/DL (ref 0.6–1.3)
GFRAA, POC: >60 mL/min/{1.73_m2} (ref >60)
GFRNA, POC: >60 mL/min/{1.73_m2} (ref >60)
Glucose (POC): 96 MG/DL (ref 65–105)
Hematocrit (POC): 29 % — ABNORMAL LOW (ref 35.0–47.0)
Hemoglobin (POC): 9.9 GM/DL — ABNORMAL LOW (ref 11.5–16.0)
Potassium (POC): 3.5 MMOL/L (ref 3.5–5.1)
Sodium (POC): 140 MMOL/L (ref 136–145)

## 2015-02-01 MED ORDER — HEPARIN, PORCINE (PF) 100 UNIT/ML IV SYRINGE
100 unit/mL | INTRAVENOUS | Status: AC | PRN
Start: 2015-02-01 — End: 2015-02-02
  Administered 2015-02-01: 19:00:00 via INTRAVENOUS

## 2015-02-01 MED ORDER — SODIUM CHLORIDE 0.9 % IV
10 mg/mL | Freq: Once | INTRAVENOUS | Status: AC
Start: 2015-02-01 — End: 2015-02-01
  Administered 2015-02-01: 17:00:00 via INTRAVENOUS

## 2015-02-01 MED ORDER — SODIUM CHLORIDE 0.9 % IJ SYRG
INTRAMUSCULAR | Status: AC | PRN
Start: 2015-02-01 — End: 2015-02-02
  Administered 2015-02-01: 19:00:00 via INTRAVENOUS

## 2015-02-01 MED ORDER — SODIUM CHLORIDE 0.9 % INJECTION
INTRAMUSCULAR | Status: AC | PRN
Start: 2015-02-01 — End: 2015-02-02
  Administered 2015-02-01: 14:00:00 via INTRAVENOUS

## 2015-02-01 MED FILL — DEXAMETHASONE SODIUM PHOSPHATE 4 MG/ML IJ SOLN: 4 mg/mL | INTRAMUSCULAR | Qty: 3

## 2015-02-01 MED FILL — HERCEPTIN 440 MG INTRAVENOUS SOLUTION: 440 mg | INTRAVENOUS | Qty: 423

## 2015-02-01 MED FILL — ALOXI 0.25 MG/5 ML INTRAVENOUS SOLUTION: 0.25 mg/5 mL | INTRAVENOUS | Qty: 1

## 2015-02-01 MED FILL — NEULASTA ONPRO 6 MG/0.6 ML WITH WEARABLE SUBCUTANEOUS INJECTOR: 6 mg/0. mL | SUBCUTANEOUS | Qty: 1

## 2015-02-01 MED FILL — CARBOPLATIN 10 MG/ML IV SOLN: 10 mg/mL | INTRAVENOUS | Qty: 54.5

## 2015-02-01 MED FILL — DOCETAXEL 20 MG/2 ML (10 MG/ML) IV SOLN: 20 mg/2 mL (10 mg/mL) | INTRAVENOUS | Qty: 13.6

## 2015-02-01 NOTE — Progress Notes (Signed)
[facility] VISIT NOTE    1093  Pt arrived at [facility] ambulatory and in no distress for C3 of TCH.  Assessment completed, pt c/o issues with both constipation and diarrhea. Patient is managing both at home with otc stool softeners and loperamide. No other complaints voiced at this time.     Blood pressure 146/63, pulse 76, temperature 97.3 ??F (36.3 ??C), resp. rate 18, height 5' 6.14" (1.68 m), weight 68.811 kg (151 lb 11.2 oz), last menstrual period 04/13/2010.    Left chest port accessed with 0.75 in huber with no difficulty. Positive blood return noted and labs drawn.  Recent Results (from the past 12 hour(s))   CBC WITH 3 PART DIFF    Collection Time: 02/01/15  9:33 AM   Result Value Ref Range    WBC 7.8 3.6 - 11.0 K/uL    RBC 3.42 (L) 3.80 - 5.20 M/uL    HGB 10.1 (L) 11.5 - 16.0 g/dL    HCT 31.3 (L) 35.0 - 47.0 %    MCV 91.5 80.0 - 99.0 FL    MCH 29.5 26.0 - 34.0 PG    MCHC 32.3 30.0 - 36.5 g/dL    RDW 16.4 (H) 11.8 - 15.8 %    PLATELET 162 150 - 400 K/uL    NEUTROPHILS 77 (H) 32 - 75 %    MIXED CELLS 9 3.2 - 16.9 %    LYMPHOCYTES 14 12 - 49 %    ABS. NEUTROPHILS 6.0 1.8 - 8.0 K/UL    ABS. MIXED CELLS 0.7 0.2 - 1.2 K/uL    ABS. LYMPHOCYTES 1.1 0.8 - 3.5 K/UL    DF AUTOMATED     HEPATIC FUNCTION PANEL    Collection Time: 02/01/15  9:33 AM   Result Value Ref Range    Protein, total 6.1 (L) 6.4 - 8.2 g/dL    Albumin 4.0 3.5 - 5.0 g/dL    Globulin 2.1 2.0 - 4.0 g/dL    A-G Ratio 1.9 1.1 - 2.2      Bilirubin, total 0.3 0.2 - 1.0 MG/DL    Bilirubin, direct 0.1 0.0 - 0.2 MG/DL    Alk. phosphatase 87 45 - 117 U/L    AST 18 15 - 37 U/L    ALT 29 12 - 78 U/L   POC CHEM8    Collection Time: 02/01/15  9:36 AM   Result Value Ref Range    Calcium, ionized (POC) 1.22 1.12 - 1.32 MMOL/L    Sodium (POC) 140 136 - 145 MMOL/L    Potassium (POC) 3.5 3.5 - 5.1 MMOL/L    Chloride (POC) 107 98 - 107 MMOL/L    CO2 (POC) 21 21 - 32 MMOL/L    Anion gap (POC) 16 (H) 5 - 15 mmol/L    Glucose (POC) 96 65 - 105 MG/DL     BUN (POC) 20 9 - 20 MG/DL    Creatinine (POC) 0.8 0.6 - 1.3 MG/DL    GFR-AA (POC) >60 >60 ml/min/1.39m    GFR, non-AA (POC) >60 >60 ml/min/1.733m   Hemoglobin (POC) 9.9 (L) 11.5 - 16.0 GM/DL    Hematocrit (POC) 29 (L) 35.0 - 47.0 %    Comment Comment Not Indicated.     Carboplatin dose recalculated to be [id number] on today's weight and labs.     Medications received:  NS IV   Aloxi IVP  Decadron IVP  Herceptin IV  Docetaxel IV  Carboplatin IV  Tolerated treatment well, no adverse reaction noted. Port de-accessed and flushed per protocol. Positive blood return noted post treatment.     Blood pressure 140/77, pulse 72, temperature 97 ??F (36.1 ??C), resp. rate 16, height 5' 6.14" (1.68 m), weight 68.811 kg (151 lb 11.2 oz), last menstrual period 04/13/2010, not currently breastfeeding.    1440  D/C'd from [facility] ambulatory and in no distress accompanied by husband. Next appointment is 02/22/15 at 0900.

## 2015-02-01 NOTE — Progress Notes (Signed)
[facility]  [address] [place]   [address]  W: [phone]   F: [phone]      F/u HEME/ONC CONSULT      Reason for visit:  evaluation for treatment for breast cancer    Consulting physician:  Dr. Gilford Rile    HPI:   Jillian Bean is a 74 y.o.  female who I was asked to see in consultation at the request of Dr. Elie Confer for evaluation for systemic therapy for breast cancer.    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  Right lumpectomy on 11/10/14 shows IDC, 1.7 cm, gr 3, 0/5 LN, no LVI.  PT1cN0Mx.    TCH: 12/21/14-     Interval History:  In today for [place] #3.  Complains of gr 1 loss of appetite, gr 2 constipation, gr 2 diarrhea, gr 1 fatigue, 0/10 pain, gr 1 neuropathy. Alternating between constipation and diarrhea with treatment for constipation.    Complains of cramping in left leg, intermittent, mild, occurs when she walks up stairs, started 3-4 days ago, 7/10 when occurs.    DX   Encounter Diagnosis   Name Primary?   ??? Malignant neoplasm of right female breast, unspecified site of breast ([place]) Yes            Past Medical History   Diagnosis Date   ??? Plantar fasciitis 09/01/2007   ??? Colonic polyps 08/31/1998   ??? OA (osteoarthritis) 09/01/2007   ??? Back pain 09/01/2007   ??? Hypercholesteremia 09/01/2007   ??? Hypothyroidism 09/01/2003   ??? S/P colonoscopy 10/10/2007   ??? 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 Hiatal hernia 07/11/2011     Dr.sobieski   ??? 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 Thyroid disease    ??? Coagulation disorder (HCC)      IRON DEFFICIENCY ANEMIA   ??? Nausea & vomiting    ??? Other ill-defined conditions(799.89)      VERTIGO   ??? Cancer [facility])      R breast     Past Surgical History   Procedure Laterality Date   ??? Hx gyn        Hysterectomy in her 45's   ??? Pr breast surgery procedure unlisted  1993     Breast Reduction   ??? Endoscopy, colon, diagnostic  7/12/18/08     7/05 Dr Maudry Diego   ??? Hx other surgical       MINIMALLY INVASIVE LUMBAR DECOMPRESSION   ??? Hx gi       COLONOSCOPY   ??? Hx heent  04/07/10     thyroid biopsy neg dr Leward Quan   ??? Hx heent       wisdom teeth extraction   ??? Hx orthopaedic  08/22/09     left TKR   ??? Hx orthopaedic  08/13/12     right tkr     History     Social History   ??? Marital Status: MARRIED     Spouse Name: N/A   ??? Number of Children: N/A   ??? Years of Education: N/A     Social History Main Topics   ??? Smoking status: Never Smoker    ??? Smokeless  tobacco: Never Used   ??? Alcohol Use: No   ??? Drug Use: No   ??? Sexual Activity: Not Currently     Other Topics Concern   ??? None     Social History Narrative     Family History   Problem Relation Age of Onset   ??? Cancer Mother      Breast   ??? Heart Failure Father      MI   ??? Seizures Brother      ms       Current Outpatient Prescriptions   Medication Sig Dispense Refill   ??? meloxicam (MOBIC) 7.5 mg tablet TAKE 1 TABLET BY MOUTH DAILY 90 Tab 0   ??? 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 latanoprost (XALATAN) 0.005 % ophthalmic solution   6   ??? lidocaine-prilocaine (EMLA) topical cream Apply  to affected area as needed for Pain. 30 g 0   ??? dexamethasone (DECADRON) 4 mg tablet 8 mg twice a day the day before and day after chemo 32 Tab 3   ??? alendronate (FOSAMAX) 70 mg tablet Take 1 Tab by mouth Every Thursday. 4 Tab 3   ??? colesevelam (WELCHOL) 625 mg tablet 1 po bid 180 Tab 2   ??? levothyroxine (SYNTHROID) 112 mcg tablet 8 per week 100 Tab 3   ??? aspirin 81 mg chewable tablet Take 81 mg by mouth daily.     ??? multivitamin (ONE A DAY) tablet Take 1 Tab by mouth daily.     ??? cholecalciferol, vitamin d3, (VITAMIN D) 1,000 unit tablet Take  by mouth daily.     ??? magic mouthwash solution Magic mouth wash   Maalox  Lidocaine 2% viscous   Diphenhydramine oral solution    Pharmacy to mix equal portions of ingredients to a total volume as indicated in the dispense amount.  Sig: Swish and spit 15-30 ml every 2-4 hours as needed for mouth pain. Ok to swallow for throat pain. 500 mL 2   ??? docusate sodium (COLACE) 100 mg capsule Take 100 mg by mouth daily.     ??? ondansetron hcl (ZOFRAN) 8 mg tablet Take 1 Tab by mouth every eight (8) hours as needed for Nausea. 24 Tab 3   ??? prochlorperazine (COMPAZINE) 10 mg tablet Take 1 Tab by mouth every six (6) hours as needed for Nausea. 50 Tab 5     Facility-Administered Medications Ordered in Other Visits   Medication Dose Route Frequency Provider Last Rate Last Dose   ??? sodium chloride (NS) flush 10 mL  10 mL IntraVENous PRN Annie Paras, MD       ??? heparin (porcine) pf 500 Units  500 Units IntraVENous PRN Annie Paras, MD       ??? sodium chloride 0.9 % injection 10 mL  10 mL IntraVENous PRN Annie Paras, MD       ??? 0.9% sodium chloride infusion  25 mL/hr IntraVENous CONTINUOUS Joyce Gross, MD       ??? palonosetron HCl (ALOXI) injection 0.25 mg  0.25 mg IntraVENous ONCE Joyce Gross, MD       ??? dexamethasone (DECADRON) 4 mg/mL injection 12 mg  12 mg IntraVENous ONCE PRN Joyce Gross, MD       ??? trastuzumab (HERCEPTIN) 423 mg in 0.9% sodium chloride 250 mL, overfill volume 25 mL IVPB  423 mg IntraVENous ONCE Joyce Gross, MD       ???  DOCEtaxel (TAXOTERE) 136 mg in 0.9% sodium chloride 250 mL, overfill volume 25 mL chemo infusion  136 mg IntraVENous ONCE Joyce Gross, MD       ??? CARBOplatin (PARAPLATIN) 555 mg in 0.9% sodium chloride 500 mL, overfill volume 50 mL chemo infusion  555 mg IntraVENous ONCE Joyce Gross, MD       ??? pegfilgrastim (NEULASTA) wearable SQ injector 6 mg  6 mg SubCUTAneous ONCE Joyce Gross, MD           Allergies   Allergen Reactions   ??? Zocor [Simvastatin] Other (comments) and Diarrhea     Other reaction(s): Adverse reaction to substance   Myalgias     ??? Celebrex [Celecoxib] Other (comments)      Aggitation   ??? Gabapentin Other (comments)     FELT UNSTEADY ON MY FEET   ??? Levaquin [Levofloxacin] Palpitations   ??? Nexium [Esomeprazole Magnesium] Diarrhea   ??? Oxycontin [Oxycodone] Itching   ??? Pravastatin Nausea and Vomiting   ??? Yellow Dye Hives       Review of Systems    A comprehensive review of systems was performed and all systems were negative except for HPI and for the symptom report form, reviewed and scanned in.        Objective:  Physical Exam:  BP 146/63 mmHg   Pulse 71   Temp(Src) 97.3 ??F (36.3 ??C) (Oral)   Resp 18   Ht '5\' 5"'  (1.651 m)   Wt 151 lb 11.2 oz (68.811 kg)   BMI 25.24 kg/m2   SpO2 97%   LMP 04/13/2010    General:  Alert, cooperative, no distress, appears stated age.   Head:  Normocephalic, without obvious abnormality, atraumatic.   Eyes:  Conjunctivae/corneas clear. PERRL, EOMs intact.   Throat: Lips, mucosa, and tongue normal.    Neck: Supple, symmetrical, trachea midline, no adenopathy, thyroid: no enlargement/tenderness/nodules   Back:   Symmetric, no curvature. ROM normal. No CVA tenderness.   Lungs:   Clear to auscultation bilaterally.   Chest wall:  No tenderness or deformity.   Heart:  Regular rate and rhythm, S1, S2 normal, no murmur, click, rub or gallop.   Breast Exam:  S/p R lumpectomy.    Abdomen:   Soft, non-tender. Bowel sounds normal. No masses,  No organomegaly.   Extremities: Extremities normal, atraumatic, no cyanosis or edema.   Skin: Skin color, texture, turgor normal. No rashes or lesions.   Lymph nodes: Cervical, supraclavicular, and axillary nodes normal.   Neurologic: CNII-XII intact.     Diagnostic Imaging   Results for orders placed during the hospital encounter of 09/03/12   XR KNEE RT MAX 2 VWS    Narrative **Final Report**       ICD Codes / Adm.Diagnosis: 780.4  70 / DJD RIGHT KNEE  DJD RIGHT KNEE  Examination:  CR KNEE MAX 2 VWS RT  - [id number] - Sep 03 2012 12:18PM  Accession No:  [id number]  Reason:  post-op film      REPORT:  INDICATION:  Postop.     COMPARISON:  No old study.    FINDINGS: Portable AP and lateral views of the right knee obtained in the   PACU show a total knee replacement in satisfactory position.  Postoperative   change in the soft tissues is present.        IMPRESSION:   Postop change right knee replacement surgery.  Signing/Reading Doctor: Milus Glazier [phone])    Approved: Milus Glazier ([id number])  Sep 03 2012 12:31PM                                Results for orders placed during the hospital encounter of 08/13/12   CT HEAD WO CONT    Narrative **Final Report**       ICD Codes / Adm.Diagnosis: 100002  70 / Dizziness  Nausea  Examination:  CT HEAD WO CON  - [id number] - Aug 13 2012  1:03AM  Accession No:  [id number]  Reason:  Pain      REPORT:  INDICATION:   Pain    EXAM:  HEAD CT WITHOUT CONTRAST    COMPARISON:  None    TECHNIQUE:  Routine noncontrast axial head CT was performed.    FINDINGS:    The ventricles are midline without hydrocephalus.  There is no acute intra   or extra-axial hemorrhage.  There is no significant white matter disease.    The basal cisterns are patent. The paranasal sinuses are clear.        IMPRESSION:    No acute process.                  Signing/Reading Doctor: Leonie Green LEWIS [phone])    Approved: Leonie Green LEWIS [phone])  Aug 13 2012  1:07AM                                   12/16/14 TTE: EF 71%.    Lab Results  Lab Results   Component Value Date/Time    WBC 9.2 01/11/2015 12:01 PM    HGB 10.3 01/11/2015 12:01 PM    HCT 31.1 01/11/2015 12:01 PM    PLATELET 213 01/11/2015 12:01 PM    MCV 88.1 01/11/2015 12:01 PM       Lab Results   Component Value Date/Time    SODIUM 140 12/21/2014 09:23 AM    POTASSIUM 4.0 12/21/2014 09:23 AM    CHLORIDE 105 12/21/2014 09:23 AM    CO2 26 12/21/2014 09:23 AM    ANION GAP 9 12/21/2014 09:23 AM    GLUCOSE 93 12/21/2014 09:23 AM    BUN 17 12/21/2014 09:23 AM    CREATININE 0.78 12/21/2014 09:23 AM    BUN/CREATININE RATIO 22 12/21/2014 09:23 AM     GFR EST AA >60 12/21/2014 09:23 AM    GFR EST NON-AA >60 12/21/2014 09:23 AM    CALCIUM 8.5 12/21/2014 09:23 AM    ALT 35 01/11/2015 11:16 AM    AST 21 01/11/2015 11:16 AM    ALK. PHOSPHATASE 80 01/11/2015 11:16 AM    PROTEIN, TOTAL 6.6 01/11/2015 11:16 AM    ALBUMIN 4.0 01/11/2015 11:16 AM    GLOBULIN 2.6 01/11/2015 11:16 AM    A-G RATIO 1.5 01/11/2015 11:16 AM       .    Assessment/Plan:  74 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  PS 0    1. Right Breast cancer stage: IA    Hormonal therapy: not indicated due to receptor (-) status    We explained to the patient that the goal of systemic adjuvant therapy is to improve the chances for cure and decrease the risk of  relapse. We explained why a patient can have microscopic cancer spread now even though physical examination, laboratory studies and imaging studies are negative for cancer. We explained that the same treatments used now as adjuvant or preventive treatments rarely if ever are curative in women who develop metastases.     She is in excellent health and thus an adjuvant discussion is warranted.  trastuzumab based therapy corresponds to a 9% overall survival benefit, which is rather larger overall (that corresponds to a nearly 20% DFS benefit).    [id number] suggests equivalency between q.3 week Adriamycin, Cytoxan followed by weekly paclitaxel and trastuzumab compared with the [facility] regimen. However, this study was not powered to show a difference between these two regimens, and in the NEJM publication, the AC-TH arm showed a numerically advantange (though not statistically significant) to the [facility] arm.?? In this patient, it is completely reasonable to use [facility] approach and avoid the potential cardiotoxicity of the anthracyclines as well as the potential for leukemia.    We discussed the toxicities of docetaxel and carboplatin chemotherapy in  detail.?? This chemotherapy frequently causes a low white blood cell count and hospital admissions for treatment of neutropenic fever.?? We explained that we consider the use of growth factors to minimize this risk.?? We explained to the patient that some side effects if they occur only last a few days including nausea, vomiting, stomatitis, arthralgia, myalgia,and allergic reactions to Taxotere.?? We told the patient that severe nausea and vomiting were uncommon and that some side effects,if they occur, will last longer; this includes hair loss, which will be seen in all patients treated with these agents and fatigue,which will be seen in most.?? We also informed that for the patient that heart damage is rare with these agents.?? We explained that carboplatin can rarely cause kidney damage and high frequency hearing loss.?? We provided the patient in detail her information concerning the toxicities of this regimen in addition to her overall discussion.??     Rationale for therapy with trastuzumab was also discussed with the patient including a 50% proportional improvement in disease free survival and also an improvement in overall survival in patients receiving trastuzumab and chemotherapy for HER-2 positive breast cancer.?? The side effects of trastuzumab were discussed including a 4%-5% risk of dropping her ejection fraction while on treatment and about a 1% risk of CHF.?? We discussed that this drug will be used every 3 weeks for remainder of a year following the chemotherapy cycles.?? We will check her EF before chemotherapy and every 3 months while she is receiving trastuzumab.    She is not a candidate to receive pertuzumab with her stage I disease.    We also discussed the APT study which is 12 weeks of weekly paclitaxel with trastuzumab.      After this discussion, she is agreeable to neoadjuvant TCH q 3 weeks x 6 followed by outback trastuzumab and pertuzumab for the completion of a  year of therapy (docetaxel 75 mg/m2; carboplatin auc 6; trastuzumab 44m/kg load then 6 mg/kg).??    Informed consent signed today, patient provided with copy.    The patient was given presciptions for emla cream, decadron to take 8 mg bid the day before and day after chemo, zofran and compazine.?? Neulasta?? the following day.    TTE ordered and cardiology referral made, seen by Dr. BMarlon Pelon 12/16/14.?? Port placed by Dr. DElie Conferon 12/20/14.     [place], will see her back in 3 weeks for Cycle #  4.     2. Emotional well being:  She has excellent support and is coping well with her disease.    3. FH of breast cancer:  BRCA 1/2 [place] testing negative    4. Constipation: Gr 1 present at baseline, recommend colace daily and miralax and senna prn    5. Anemia:  Mild; due to chemo; will monitor    6. Insomnia:  Due to steroids, will monitor    7. Diarrhea:  Due to constipation relief, discussed constipation management    8. Left leg pain, posterior thigh:  Will get left LE doppler      Thank you for this consult.  All of the patient's questions were answered today.              Follow-up Disposition:  Return in 3 weeks (on 02/22/2015).    Sonda Rumble, MD

## 2015-02-01 NOTE — Progress Notes (Signed)
Jillian Bean is a 74 y.o. female  Chief Complaint   Patient presents with   ??? Breast Cancer

## 2015-02-01 NOTE — Progress Notes (Signed)
Problem: Chemotherapy Treatment  Goal: *Chemotherapy regimen followed  Outcome: Progressing Towards Goal  Patient to receive C3 of [place] today as ordered.

## 2015-02-02 MED ORDER — ALENDRONATE 70 MG TAB
70 mg | ORAL_TABLET | ORAL | Status: DC
Start: 2015-02-02 — End: 2015-02-22

## 2015-02-03 ENCOUNTER — Inpatient Hospital Stay: Admit: 2015-02-03 | Payer: MEDICARE | Attending: Specialist | Primary: Internal Medicine

## 2015-02-03 DIAGNOSIS — M79609 Pain in unspecified limb: Secondary | ICD-10-CM

## 2015-02-04 NOTE — Procedures (Signed)
[name]  *** FINAL REPORT ***    Name: Jillian Bean, Jillian Bean  MRN: [id number]    Outpatient  DOB: 12-07-1940  HIS Order #: [id number]  [place] Visit #: [id number]  Date: 03 Feb 2015    TYPE OF TEST: Peripheral Venous Testing    REASON FOR TEST  Pain in limb    Left Leg:-  Deep venous thrombosis:           No  Superficial venous thrombosis:    No  Deep venous insufficiency:        No  Superficial venous insufficiency: No      INTERPRETATION/FINDINGS  PROCEDURE:  LEFT LOWER EXTREMITY VENOUS DUPLEX . Evaluation of lower  extremity veins with ultrasound (B-mode imaging, pulsed Doppler, color   Doppler).  Includes the common femoral, deep femoral, femoral,  popliteal, posterior tibial, peroneal, and great saphenous veins.  Other veins, for example the gastrocnemius and soleal veins, may also  be visualized.    FINDINGS: Folkes scale and color flow duplex images of the veins in the  left lower extremity demonstrate normal compressibility, spontaneous  and augmented flow profiles, and absence of filling defects throughout   the deep and superficial veins in the left lower extremity.    CONCLUSION: Left lower extremity venous duplex negative for deep  venous thrombosis or thrombophlebitis. Right common femoral vein is  thrombus free.    ADDITIONAL COMMENTS    I have personally reviewed the data relevant to the interpretation of  this  study.    TECHNOLOGIST: Jennelle Human. Nicks, RVT  Signed: 02/03/2015 2:17:28 PM    PHYSICIAN: Altamese Dilling T. Suzan Slick, MD  Signed: 02/04/2015 3:03:54 PM

## 2015-02-04 NOTE — Procedures (Signed)
[name]  *** FINAL REPORT ***    Name: Jillian Bean, Jillian Bean  MRN: [id number]    Outpatient  DOB: 1940/11/28  HIS Order #: [id number]  [place] Visit #: [id number]  Date: 03 Feb 2015    TYPE OF TEST: Peripheral Venous Testing    REASON FOR TEST  Pain in limb    Left Leg:-  Deep venous thrombosis:           No  Superficial venous thrombosis:    No  Deep venous insufficiency:        No  Superficial venous insufficiency: No      INTERPRETATION/FINDINGS  PROCEDURE:  LEFT LOWER EXTREMITY VENOUS DUPLEX . Evaluation of lower  extremity veins with ultrasound (B-mode imaging, pulsed Doppler, color   Doppler).  Includes the common femoral, deep femoral, femoral,  popliteal, posterior tibial, peroneal, and great saphenous veins.  Other veins, for example the gastrocnemius and soleal veins, may also  be visualized.    FINDINGS: Lecompte scale and color flow duplex images of the veins in the  left lower extremity demonstrate normal compressibility, spontaneous  and augmented flow profiles, and absence of filling defects throughout   the deep and superficial veins in the left lower extremity.    CONCLUSION: Left lower extremity venous duplex negative for deep  venous thrombosis or thrombophlebitis. Right common femoral vein is  thrombus free.    ADDITIONAL COMMENTS    I have personally reviewed the data relevant to the interpretation of  this  study.    TECHNOLOGIST: Jennelle Human. Nicks, RVT  Signed: 02/03/2015 2:17:28 PM    PHYSICIAN: Altamese Dilling T. Suzan Slick, MD  Signed: 02/04/2015 3:03:54 PM

## 2015-02-17 ENCOUNTER — Inpatient Hospital Stay: Admit: 2015-02-17 | Payer: MEDICARE | Primary: Internal Medicine

## 2015-02-17 DIAGNOSIS — I890 Lymphedema, not elsewhere classified: Secondary | ICD-10-CM

## 2015-02-17 MED ORDER — PEGFILGRASTIM 6 MG/0.6 ML (DELIVERABLE) WEARABLE SUBCUTANEOUS INJECTOR
6 mg/0. mL | Freq: Once | SUBCUTANEOUS | Status: AC
Start: 2015-02-17 — End: 2015-02-22
  Administered 2015-02-22: 21:00:00 via SUBCUTANEOUS

## 2015-02-17 MED ORDER — SODIUM CHLORIDE 0.9 % IV
440 mg | Freq: Once | INTRAVENOUS | Status: AC
Start: 2015-02-17 — End: 2015-02-22
  Administered 2015-02-22: 18:00:00 via INTRAVENOUS

## 2015-02-17 MED ORDER — DOCETAXEL 20 MG/2 ML (10 MG/ML) IV SOLN
20210 mg/2 mL (10 mg/mL) | Freq: Once | INTRAVENOUS | Status: AC
Start: 2015-02-17 — End: 2015-02-22
  Administered 2015-02-22: 19:00:00 via INTRAVENOUS

## 2015-02-17 MED ORDER — CARBOPLATIN 10 MG/ML IV SOLN
10 mg/mL | Freq: Once | INTRAVENOUS | Status: DC
Start: 2015-02-17 — End: 2015-02-22

## 2015-02-17 MED ORDER — DEXAMETHASONE SODIUM PHOSPHATE 4 MG/ML IJ SOLN
4 mg/mL | Freq: Once | INTRAMUSCULAR | Status: AC | PRN
Start: 2015-02-17 — End: 2015-02-22
  Administered 2015-02-22: 16:00:00 via INTRAVENOUS

## 2015-02-17 MED ORDER — PALONOSETRON 0.25 MG/5 ML IV
0.25 mg/5 mL | Freq: Once | INTRAVENOUS | Status: AC
Start: 2015-02-17 — End: 2015-02-22
  Administered 2015-02-22: 17:00:00 via INTRAVENOUS

## 2015-02-17 MED ORDER — SODIUM CHLORIDE 0.9 % IV
INTRAVENOUS | Status: AC
Start: 2015-02-17 — End: 2015-02-22
  Administered 2015-02-22: 16:00:00 via INTRAVENOUS

## 2015-02-17 MED FILL — CARBOPLATIN 10 MG/ML IV SOLN: 10 mg/mL | INTRAVENOUS | Qty: 54.5

## 2015-02-17 MED FILL — SODIUM CHLORIDE 0.9 % IV: INTRAVENOUS | Qty: 1000

## 2015-02-17 NOTE — Progress Notes (Addendum)
_                                    [facility]  [address]  [address]  [place]  [address]      LYmphedema Therapy  Visit: 3            Daily note                      30 day/10th visit progress note    NAME: Jillian Bean  DATE: 02/17/2015    GOALS  1. Order compression sleeve/gauntlet R UE.?? Pt has received Juzo arm sleeve and gauntlet, 1 set to date from [place].  2.?? Fit compression garments upon receiving and establish effective wear schedule.  Juzo 20-40mHg garments fitted today and pt tolerated well during exercise set and wore out of clinic.  Instructed to wear garments for repetitive activities, exercise, airplane travel, heavy housework and yardwork.?? Pt may need to increase to daily wear once radiation txs begin.  3.?? Educate patient regarding lymphedema risk reduction practices.??Goal met 01/28/15.??     ????  SUBJECTIVE REPORT:??Patient arrives with Right UE sleeve and gauntlet in place.  States she has 3 more chemo session and will begin radiation treatment following that.    Pain:?? 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   ??  ??    ??  ??    ??  ??    ??  ??    ??  ??   Gait:?? Independent.  ADLs:?? Independent.  Treatment Response:?? Pt has chemo every 3 weeks on Tuesday, scheduled for next week.?? Radiation to begin in August.?? She tried garments on at home and wants [place] make sure they are the correct length.  Reviewed packet received at evaluation including skin care products, arm drainage exercises, self MLD handout and resource for community based MLD CMT, and future compression needs should they arise.  Function: Independent.?? Pt performing skin care and household skills independently and instructed in garment don/doff, exercises and self MLD today.  ????  TREATMENT AND OBJECTIVE DATA SUMMARY:??    Patient/Family Education:?? ??   ????  Educated in skin care:?? Pt demonstrates good skin care principles??     Educated in exercise:??   Instructed in 3 decongestive exercise routines to alternate 3-7x/week at home.??     Instructed in self MLD:??   Written sequence given and reviewed with patient as well as demonstration and instruction during MLD portion of the session.??     Instructed in don/doff of compression system:??   Pt able to don/doff RUE arm sleeve and gauntlet using donning glove on L hand with verbal cues.??   ????  Therapeutic Activity?? 0 minutes??   Treatment time: ??  Functional Mobility:?? Modified independent.??   Fall risk:?? Normal??   ????  Therapeutic Exercise/Procedure??  10 minutes??   Treatment time: 1:00-1:10 pm  Gertie ball exercise program:?? Demonstration by therapist of written routine.?? Patient instructed where to buy Gertie ball.??   Stick exercise program:?? Therapist demonstrated this routine to pt and pt given written handout.??   Free exercises/ROM:?? Pt performed this routine 5x each with verbal instruction and good form.??  Clarification on 2 exercises: trunk rotation,  and imaginary weight on string.  Demonstrated by therapist with return demonstration by patient with good performance.   Home program:?? Patient to perform daily to BID skin care, deep abdominal breathing, decongestive exercise routine, and wear compression sleeve and gauntlet as needed.??   Rationale:?? Exercises will increase the lymph angiomotoricity and tissue pressure of the skin and thus decrease swelling along with compression.??   ????  Modalities?? 0 minutes??   Treatment time: ??  Vasopneumatic pump:?? Not necessary at this point.??   ????  Manual Lymphatic Drainage (MLD)??  30 minutes??   Treatment time: 12:30-1:00 pm  Area to decongest:?? RUE??   Sequence used and effectiveness:?? Performed by patient  Diaphragmatic breathing  Short neck (omit cervical due to history of thyroid issues)  Shoulder collectors  Left Axillary node  Right IAA   Right Inguinal node  Right AIA  Therapist performed:  Full arm sequence  Pt responds well and her volumes have maintained in the RUE since last eval.??   Skin/wound care/debridement:?? No open areas or redness noted.??   Upper extremity compression:?? 20-12mHg Juzo arm sleeve size II Max long and 56M gauntlet.  Patient with extra length at end of sleeve and she folded the garment back on itself.  No skin breakdown or abnormal pocketing of fluid.  Patient instructed not to fold garment.  She verbalized understanding.  Patient was educated on the requirement to wash and dry the garment daily (which she had NOT been performing) and of the following:??    Compression garment routine:  2. ??Apply daytime compression garments to clean, dry extremity first thing in the morning, EVERY MORNING.?? ??  3. Wear garments until bedtime, adjusting throughout the day as needed should garment wrinkle or crease.?? Problem areas can be at joint, and top of garment, ensuring proximal aspect of garment is positioned appropriately on extremity.  4. Launder compression garments nightly either by hand or washing machine in a garment bag or pillowcase.?? Use mild detergent with NO FABRIC SOFTENER, NO BLEACH, NO WOOLITE.?? Wash in cool/warm water.?? ??  5. Dry garment in dryer on low heat right side out in garment bag or pillowcase.  6. Daytime compression garments are NOT safe to sleep in, so remove at bedtime.?? ??  7. Perform skin care to extremity, cleansing skin daily with soap and water, and using low pH lotion, upward strokes to stimulate lymphatic vessels. ??  8. Apply night time compression garment as instructed to wear while sleeping, adjusting throughout the night as needed for comfort.  9. Perform exercise program with daytime compression stockings on.  10.   Signs/Symptoms of infection include:?? Fever, flu-like symptoms, pain/redness/warmth in extremity, sometimes with increase in swelling  present.?? Stop wearing your compression garment if this occurs.?? Call your doctor immediately or go to the Emergency room to address potential infection.    Remove compression with changes in circulation or numbness in extremity, different from what you may experience when not wearing compression garment, pain, unexplained shortness of breath.??   Kinesiotaping:?? NA??   Girth/Volume measurement:?? Reviewed results of volumetric measurements taken on evaluation and improvement noted this visit.?? No palpable lymphedema noted.?? RUE 3% less than LUE and pt is R hand dominant.??   ????  TOTAL TREATMENT?? 40 mins??       ????  ASSESSMENT:??   Treatment effectiveness and tolerance:?? Patient instructed in self-MLD trunk sequence today for home usage.  Review of HEP exercises, patient has excellent compliance. Clarified 2 exercises that patient had questions regarding (  trunk rotation-torso twist, and holding dowel rod with imaginary string with weight attached).  Instructed patient in washing of garment daily for improved fit and wear.  Hand out given       Progress toward goals:?? Progressing toward all STG.??   ????  PLAN OF CARE:??   Changes to the plan of care:?? Continue to re-assess while ongoing chemo and radiation treatments.   Frequency:?? Ongoing while chemo and radiation treatments continue??                 Manus Gunning, PT, DPT,CLT  40 minutes

## 2015-02-22 ENCOUNTER — Inpatient Hospital Stay: Admit: 2015-02-22 | Payer: MEDICARE | Primary: Internal Medicine

## 2015-02-22 ENCOUNTER — Encounter: Attending: Acute Care | Primary: Internal Medicine

## 2015-02-22 ENCOUNTER — Ambulatory Visit: Admit: 2015-02-22 | Discharge: 2015-02-22 | Payer: MEDICARE | Attending: Nurse Practitioner | Primary: Internal Medicine

## 2015-02-22 DIAGNOSIS — Z5111 Encounter for antineoplastic chemotherapy: Secondary | ICD-10-CM

## 2015-02-22 DIAGNOSIS — C50911 Malignant neoplasm of unspecified site of right female breast: Secondary | ICD-10-CM

## 2015-02-22 LAB — CBC WITH 3 PART DIFF
ABS. LYMPHOCYTES: 1.4 10*3/uL (ref 0.8–3.5)
ABS. MIXED CELLS: 0.9 10*3/uL (ref 0.2–1.2)
ABS. NEUTROPHILS: 10.6 10*3/uL — ABNORMAL HIGH (ref 1.8–8.0)
HCT: 30.3 % — ABNORMAL LOW (ref 35.0–47.0)
HGB: 10 g/dL — ABNORMAL LOW (ref 11.5–16.0)
LYMPHOCYTES: 11 % — ABNORMAL LOW (ref 12–49)
MCH: 31.3 PG (ref 26.0–34.0)
MCHC: 33 g/dL (ref 30.0–36.5)
MCV: 94.7 FL (ref 80.0–99.0)
Mixed cells: 7 % (ref 3.2–16.9)
NEUTROPHILS: 82 % — ABNORMAL HIGH (ref 32–75)
PLATELET: 146 10*3/uL — ABNORMAL LOW (ref 150–400)
RBC: 3.2 M/uL — ABNORMAL LOW (ref 3.80–5.20)
RDW: 17.3 % — ABNORMAL HIGH (ref 11.8–15.8)
WBC: 12.9 10*3/uL — ABNORMAL HIGH (ref 3.6–11.0)

## 2015-02-22 LAB — METABOLIC PANEL, COMPREHENSIVE
A-G Ratio: 1.7 (ref 1.1–2.2)
ALT (SGPT): 29 U/L (ref 12–78)
AST (SGOT): 17 U/L (ref 15–37)
Albumin: 3.9 g/dL (ref 3.5–5.0)
Alk. phosphatase: 83 U/L (ref 45–117)
Anion gap: 7 mmol/L (ref 5–15)
BUN/Creatinine ratio: 27 — ABNORMAL HIGH (ref 12–20)
BUN: 18 MG/DL (ref 6–20)
Bilirubin, total: 0.3 MG/DL (ref 0.2–1.0)
CO2: 27 mmol/L (ref 21–32)
Calcium: 8.7 MG/DL (ref 8.5–10.1)
Chloride: 108 mmol/L (ref 97–108)
Creatinine: 0.67 MG/DL (ref 0.55–1.02)
GFR est AA: >60 mL/min/{1.73_m2} (ref >60)
GFR est non-AA: >60 mL/min/{1.73_m2} (ref >60)
Globulin: 2.3 g/dL (ref 2.0–4.0)
Glucose: 80 mg/dL (ref 65–100)
Potassium: 4.2 mmol/L (ref 3.5–5.1)
Protein, total: 6.2 g/dL — ABNORMAL LOW (ref 6.4–8.2)
Sodium: 142 mmol/L (ref 136–145)

## 2015-02-22 LAB — POC CHEM8
Anion gap (POC): 16 mmol/L — ABNORMAL HIGH (ref 5–15)
BUN (POC): 19 MG/DL (ref 9–20)
CO2 (POC): 25 MMOL/L (ref 21–32)
Calcium, ionized (POC): 1.21 MMOL/L (ref 1.12–1.32)
Chloride (POC): 106 MMOL/L (ref 98–107)
Creatinine (POC): 0.8 MG/DL (ref 0.6–1.3)
GFRAA, POC: >60 mL/min/{1.73_m2} (ref >60)
GFRNA, POC: >60 mL/min/{1.73_m2} (ref >60)
Glucose (POC): 81 MG/DL (ref 65–105)
Hematocrit (POC): 28 % — ABNORMAL LOW (ref 35.0–47.0)
Hemoglobin (POC): 9.5 GM/DL — ABNORMAL LOW (ref 11.5–16.0)
Potassium (POC): 4.1 MMOL/L (ref 3.5–5.1)
Sodium (POC): 142 MMOL/L (ref 136–145)

## 2015-02-22 MED ORDER — SODIUM CHLORIDE 0.9 % INJECTION
INTRAMUSCULAR | Status: AC | PRN
Start: 2015-02-22 — End: 2015-02-23
  Administered 2015-02-22: 14:00:00 via INTRAVENOUS

## 2015-02-22 MED ORDER — HEPARIN, PORCINE (PF) 100 UNIT/ML IV SYRINGE
100 unit/mL | INTRAVENOUS | Status: AC | PRN
Start: 2015-02-22 — End: 2015-02-23
  Administered 2015-02-22: 21:00:00 via INTRAVENOUS

## 2015-02-22 MED ORDER — OVERFILL VOLUME
10 mg/mL | Freq: Once | INTRAVENOUS | Status: AC
Start: 2015-02-22 — End: 2015-02-22
  Administered 2015-02-22: 20:00:00 via INTRAVENOUS

## 2015-02-22 MED ORDER — SODIUM CHLORIDE 0.9 % IJ SYRG
INTRAMUSCULAR | Status: DC | PRN
Start: 2015-02-22 — End: 2015-02-26
  Administered 2015-02-22 (×2): via INTRAVENOUS

## 2015-02-22 MED FILL — NEULASTA ONPRO 6 MG/0.6 ML WITH WEARABLE SUBCUTANEOUS INJECTOR: 6 mg/0. mL | SUBCUTANEOUS | Qty: 1

## 2015-02-22 MED FILL — SODIUM CHLORIDE 0.9 % INJECTION: INTRAMUSCULAR | Qty: 10

## 2015-02-22 MED FILL — DEXAMETHASONE SODIUM PHOSPHATE 4 MG/ML IJ SOLN: 4 mg/mL | INTRAMUSCULAR | Qty: 3

## 2015-02-22 MED FILL — ALOXI 0.25 MG/5 ML INTRAVENOUS SOLUTION: 0.25 mg/5 mL | INTRAVENOUS | Qty: 1

## 2015-02-22 MED FILL — DOCETAXEL 160 MG/16 ML (10 MG/ML) IV SOLN: 160 mg/16 mL (10 mg/mL) | INTRAVENOUS | Qty: 13.6

## 2015-02-22 MED FILL — HERCEPTIN 440 MG INTRAVENOUS SOLUTION: 440 mg | INTRAVENOUS | Qty: 423

## 2015-02-22 MED FILL — CARBOPLATIN 10 MG/ML IV SOLN: 10 mg/mL | INTRAVENOUS | Qty: 62

## 2015-02-22 NOTE — Progress Notes (Signed)
[facility]  [address], [place]   [address]  W: [phone]   F: [phone]      F/u HEME/ONC CONSULT      Reason for visit:  evaluation for treatment for breast cancer    Consulting physician:  Dr. Gilford Rile    HPI:   Jillian Bean is a 74 y.o.  female who I was asked to see in consultation at the request of Dr. Elie Confer for evaluation for systemic therapy for breast cancer.    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  Right lumpectomy on 11/10/14 shows IDC, 1.7 cm, gr 3, 0/5 LN, no LVI.  PT1cN0Mx.    TCH: 12/21/14-     Interval History:  In today for [place] #4.  Complains of gr 1 loss of appetite, gr 1-2 constipation, gr 1 diarrhea, gr 1 fatigue, gr 1 insomnia, gr 1 mouth sores, gr 1 neuropathy, gr 1 headache. Constipation improved with this cycle. Leg pain resolved. Pt has noticed some increased "fuzzy" vision.     DX   Encounter Diagnosis   Name Primary?   ??? Malignant neoplasm of right female breast, unspecified site of breast ([place]) Yes            Past Medical History   Diagnosis Date   ??? Plantar fasciitis 09/01/2007   ??? Colonic polyps 08/31/1998   ??? OA (osteoarthritis) 09/01/2007   ??? Back pain 09/01/2007   ??? Hypercholesteremia 09/01/2007   ??? Hypothyroidism 09/01/2003   ??? S/P colonoscopy 10/10/2007   ??? 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 Hiatal hernia 07/11/2011     Dr.sobieski   ??? 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 Thyroid disease    ??? Coagulation disorder (HCC)      IRON DEFFICIENCY ANEMIA   ??? Nausea & vomiting    ??? Other ill-defined conditions(799.89)      VERTIGO   ??? Cancer [facility])      R breast     Past Surgical History   Procedure Laterality Date   ??? Hx gyn       Hysterectomy in her 89's    ??? Pr breast surgery procedure unlisted  1993     Breast Reduction   ??? Endoscopy, colon, diagnostic  7/12/18/08     7/05 Dr Maudry Diego   ??? Hx other surgical       MINIMALLY INVASIVE LUMBAR DECOMPRESSION   ??? Hx gi       COLONOSCOPY   ??? Hx heent  04/07/10     thyroid biopsy neg dr Leward Quan   ??? Hx heent       wisdom teeth extraction   ??? Hx orthopaedic  08/22/09     left TKR   ??? Hx orthopaedic  08/13/12     right tkr     History     Social History   ??? Marital Status: MARRIED     Spouse Name: N/A   ??? Number of Children: N/A   ??? Years of Education: N/A     Social History Main Topics   ??? Smoking status: Never Smoker    ??? Smokeless tobacco: Never Used   ??? Alcohol Use: No   ???  Drug Use: No   ??? Sexual Activity: Not Currently     Other Topics Concern   ??? None     Social History Narrative     Family History   Problem Relation Age of Onset   ??? Cancer Mother      Breast   ??? Heart Failure Father      MI   ??? Seizures Brother      ms       Current Outpatient Prescriptions   Medication Sig Dispense Refill   ??? magic mouthwash solution Magic mouth wash   Maalox  Lidocaine 2% viscous   Diphenhydramine oral solution   Pharmacy to mix equal portions of ingredients to a total volume as indicated in the dispense amount.  Sig: Swish and spit 15-30 ml every 2-4 hours as needed for mouth pain. Ok to swallow for throat pain. 500 mL 2   ??? docusate sodium (COLACE) 100 mg capsule Take 100 mg by mouth daily.     ??? meloxicam (MOBIC) 7.5 mg tablet TAKE 1 TABLET BY MOUTH DAILY 90 Tab 0   ??? 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 latanoprost (XALATAN) 0.005 % ophthalmic solution Administer  to both eyes nightly.  6   ??? lidocaine-prilocaine (EMLA) topical cream Apply  to affected area as needed for Pain. 30 g 0   ??? dexamethasone (DECADRON) 4 mg tablet 8 mg twice a day the day before and day after chemo 32 Tab 3   ??? prochlorperazine (COMPAZINE) 10 mg tablet Take 1 Tab by mouth every six (6) hours as needed for Nausea. 50 Tab 5    ??? alendronate (FOSAMAX) 70 mg tablet Take 1 Tab by mouth Every Thursday. 4 Tab 3   ??? colesevelam (WELCHOL) 625 mg tablet 1 po bid 180 Tab 2   ??? levothyroxine (SYNTHROID) 112 mcg tablet 8 per week 100 Tab 3   ??? aspirin 81 mg chewable tablet Take 81 mg by mouth daily.     ??? multivitamin (ONE A DAY) tablet Take 1 Tab by mouth daily.     ??? cholecalciferol, vitamin d3, (VITAMIN D) 1,000 unit tablet Take  by mouth daily.     ??? ondansetron hcl (ZOFRAN) 8 mg tablet Take 1 Tab by mouth every eight (8) hours as needed for Nausea. 24 Tab 3     Facility-Administered Medications Ordered in Other Visits   Medication Dose Route Frequency Provider Last Rate Last Dose   ??? sodium chloride 0.9 % injection 10 mL  10 mL IntraVENous PRN Annie Paras, MD   10 mL at 02/22/15 1003   ??? heparin (porcine) pf 500 Units  500 Units IntraVENous PRN Annie Paras, MD       ??? sodium chloride (NS) flush 10-40 mL  10-40 mL IntraVENous PRN Annie Paras, MD   30 mL at 02/22/15 1003   ??? 0.9% sodium chloride infusion  25 mL/hr IntraVENous CONTINUOUS Joyce Gross, MD       ??? palonosetron HCl (ALOXI) injection 0.25 mg  0.25 mg IntraVENous ONCE Joyce Gross, MD       ??? dexamethasone (DECADRON) 4 mg/mL injection 12 mg  12 mg IntraVENous ONCE PRN Joyce Gross, MD       ??? pegfilgrastim (NEULASTA) wearable SQ injector 6 mg  6 mg SubCUTAneous ONCE Joyce Gross, MD       ??? trastuzumab (HERCEPTIN) 423 mg in 0.9% sodium chloride  250 mL, overfill volume 25 mL IVPB  423 mg IntraVENous ONCE Joyce Gross, MD       ??? DOCEtaxel (TAXOTERE) 136 mg in 0.9% sodium chloride 250 mL, overfill volume 25 mL chemo infusion  136 mg IntraVENous ONCE Joyce Gross, MD       ??? CARBOplatin (PARAPLATIN) 545 mg in 0.9% sodium chloride 500 mL, overfill volume 50 mL chemo infusion  545 mg IntraVENous ONCE Joyce Gross, MD           Allergies   Allergen Reactions   ??? Zocor [Simvastatin] Other (comments) and Diarrhea      Other reaction(s): Adverse reaction to substance   Myalgias     ??? Celebrex [Celecoxib] Other (comments)     Aggitation   ??? Gabapentin Other (comments)     FELT UNSTEADY ON MY FEET   ??? Levaquin [Levofloxacin] Palpitations   ??? Nexium [Esomeprazole Magnesium] Diarrhea   ??? Oxycontin [Oxycodone] Itching   ??? Pravastatin Nausea and Vomiting   ??? Yellow Dye Hives       Review of Systems    A comprehensive review of systems was performed and all systems were negative except for HPI and for the symptom report form, reviewed and scanned in.        Objective:  Physical Exam:  BP 138/57 mmHg   Pulse 67   Temp(Src) 96.1 ??F (35.6 ??C) (Oral)   Resp 16   Ht 5' 6" (1.676 m)   Wt 151 lb 6.4 oz (68.675 kg)   BMI 24.45 kg/m2   SpO2 100%   LMP 04/13/2010    General:  Alert, cooperative, no distress, appears stated age.   Head:  Normocephalic, without obvious abnormality, atraumatic.   Eyes:  Conjunctivae/corneas clear. PERRL, EOMs intact.   Throat: Lips, mucosa, and tongue normal.    Neck: Supple, symmetrical, trachea midline, no adenopathy, thyroid: no enlargement/tenderness/nodules   Back:   Symmetric, no curvature. ROM normal. No CVA tenderness.   Lungs:   Clear to auscultation bilaterally.   Chest wall:  No tenderness or deformity.   Heart:  Regular rate and rhythm, S1, S2 normal, no murmur, click, rub or gallop.   Breast Exam:  S/p R lumpectomy.    Abdomen:   Soft, non-tender. Bowel sounds normal. No masses,  No organomegaly.   Extremities: Extremities normal, atraumatic, no cyanosis or edema.   Skin: Skin color, texture, turgor normal. No rashes or lesions.   Lymph nodes: Cervical, supraclavicular, and axillary nodes normal.   Neurologic: CNII-XII intact.     Diagnostic Imaging     12/16/14 TTE: EF 71%.    Lab Results  Lab Results   Component Value Date/Time    WBC 12.9 02/22/2015 09:56 AM    HGB 10.0 02/22/2015 09:56 AM    HCT 30.3 02/22/2015 09:56 AM    PLATELET 146 02/22/2015 09:56 AM    MCV 94.7 02/22/2015 09:56 AM        Lab Results   Component Value Date/Time    SODIUM 142 02/22/2015 09:56 AM    POTASSIUM 4.2 02/22/2015 09:56 AM    CHLORIDE 108 02/22/2015 09:56 AM    CO2 27 02/22/2015 09:56 AM    ANION GAP 7 02/22/2015 09:56 AM    GLUCOSE 80 02/22/2015 09:56 AM    BUN 18 02/22/2015 09:56 AM    CREATININE 0.67 02/22/2015 09:56 AM    BUN/CREATININE RATIO 27 02/22/2015 09:56 AM    GFR EST AA >60 02/22/2015 09:56  AM    GFR EST NON-AA >60 02/22/2015 09:56 AM    CALCIUM 8.7 02/22/2015 09:56 AM    ALT 29 02/22/2015 09:56 AM    AST 17 02/22/2015 09:56 AM    ALK. PHOSPHATASE 83 02/22/2015 09:56 AM    PROTEIN, TOTAL 6.2 02/22/2015 09:56 AM    ALBUMIN 3.9 02/22/2015 09:56 AM    GLOBULIN 2.3 02/22/2015 09:56 AM    A-G RATIO 1.7 02/22/2015 09:56 AM       .    Assessment/Plan:  74 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  PS 0    1. Right Breast cancer stage: IA    Hormonal therapy: not indicated due to receptor (-) status    We explained to the patient that the goal of systemic adjuvant therapy is to improve the chances for cure and decrease the risk of relapse. We explained why a patient can have microscopic cancer spread now even though physical examination, laboratory studies and imaging studies are negative for cancer. We explained that the same treatments used now as adjuvant or preventive treatments rarely if ever are curative in women who develop metastases.     She is in excellent health and thus an adjuvant discussion is warranted.  trastuzumab based therapy corresponds to a 9% overall survival benefit, which is rather larger overall (that corresponds to a nearly 20% DFS benefit).    BCIRG006 suggests equivalency between q.3 week Adriamycin, Cytoxan followed by weekly paclitaxel and trastuzumab compared with the [facility] regimen. However, this study was not powered to show a difference between these two regimens, and in the NEJM publication, the AC-TH arm showed a  numerically advantange (though not statistically significant) to the [facility] arm.?? In this patient, it is completely reasonable to use [facility] approach and avoid the potential cardiotoxicity of the anthracyclines as well as the potential for leukemia.    We discussed the toxicities of docetaxel and carboplatin chemotherapy in detail.?? This chemotherapy frequently causes a low white blood cell count and hospital admissions for treatment of neutropenic fever.?? We explained that we consider the use of growth factors to minimize this risk.?? We explained to the patient that some side effects if they occur only last a few days including nausea, vomiting, stomatitis, arthralgia, myalgia,and allergic reactions to Taxotere.?? We told the patient that severe nausea and vomiting were uncommon and that some side effects,if they occur, will last longer; this includes hair loss, which will be seen in all patients treated with these agents and fatigue,which will be seen in most.?? We also informed that for the patient that heart damage is rare with these agents.?? We explained that carboplatin can rarely cause kidney damage and high frequency hearing loss.?? We provided the patient in detail her information concerning the toxicities of this regimen in addition to her overall discussion.??     Rationale for therapy with trastuzumab was also discussed with the patient including a 50% proportional improvement in disease free survival and also an improvement in overall survival in patients receiving trastuzumab and chemotherapy for HER-2 positive breast cancer.?? The side effects of trastuzumab were discussed including a 4%-5% risk of dropping her ejection fraction while on treatment and about a 1% risk of CHF.?? We discussed that this drug will be used every 3 weeks for remainder of a year following the chemotherapy cycles.?? We will check her EF before chemotherapy and every 3 months while she is  receiving trastuzumab.     She is not a candidate to receive pertuzumab with her stage I disease.    We also discussed the APT study which is 12 weeks of weekly paclitaxel with trastuzumab.      After this discussion, she is agreeable to neoadjuvant TCH q 3 weeks x 6 followed by outback trastuzumab and pertuzumab for the completion of a year of therapy (docetaxel 75 mg/m2; carboplatin auc 6; trastuzumab 33m/kg load then 6 mg/kg).??    Informed consent signed, patient provided with copy.    The patient was given presciptions for emla cream, decadron to take 8 mg bid the day before and day after chemo, zofran and compazine.?? Neulasta?? the following day.    TTE ordered and cardiology referral made, seen by Dr. BMarlon Pelon 12/16/14.?? Port placed by Dr. DElie Conferon 12/20/14. Repeat TTE scheduled for 03/24/15, but will move up to before C#5.    TW[place]#4 today, will see her back in 3 weeks for Cycle #5.     2. Emotional well being:  She has excellent support and is coping well with her disease.    3. FH of breast cancer:  BRCA 1/2 [place] testing negative    4. Constipation: Gr 1 present at baseline, using colace daily and miralax and senna prn with improvement    5. Anemia:  Mild; due to chemo; will monitor    6. Insomnia:  Due to steroids, will monitor    7. Diarrhea:  Improved, Due to constipation relief, discussed constipation management    8. Left leg pain, posterior thigh: Resolved,  LE doppler on 02/03/15 negative    9. Blurry vision: new, likely due to dry eye. Recommended trying saline drops. Appointment to see opthalmologist next month.     Thank you for this consult.  All of the patient's questions were answered today.              Follow-up Disposition:  Return in 3 weeks (on 03/15/2015) for labs, sand/Deboraha Goar, [facility] [place]#5.    WSonda Rumble MD

## 2015-02-22 NOTE — Progress Notes (Signed)
[facility] VISIT NOTE    0915  Pt arrived at [facility] ambulatory and in no distress for C4 TCH  Assessment completed, pt c/o had no complaints  Visit Vitals   Item Reading   ??? BP 144/69 mmHg   ??? Pulse 71   ??? Temp 96.9 ??F (36.1 ??C)   ??? Resp 16   ??? Ht '5\' 6"'  (1.676 m)   ??? Wt 68.72 kg (151 lb 8 oz)   ??? BMI 24.46 kg/m2     Left chest port accessed with .75 in huber no difficulty. Positive blood return noted and labs drawn.  Recent Results (from the past 12 hour(s))   METABOLIC PANEL, COMPREHENSIVE    Collection Time: 02/22/15  9:56 AM   Result Value Ref Range    Sodium 142 136 - 145 mmol/L    Potassium 4.2 3.5 - 5.1 mmol/L    Chloride 108 97 - 108 mmol/L    CO2 27 21 - 32 mmol/L    Anion gap 7 5 - 15 mmol/L    Glucose 80 65 - 100 mg/dL    BUN 18 6 - 20 MG/DL    Creatinine 0.67 0.55 - 1.02 MG/DL    BUN/Creatinine ratio 27 (H) 12 - 20      GFR est AA >60 >60 ml/min/1.52m    GFR est non-AA >60 >60 ml/min/1.73m   Calcium 8.7 8.5 - 10.1 MG/DL    Bilirubin, total 0.3 0.2 - 1.0 MG/DL    ALT 29 12 - 78 U/L    AST 17 15 - 37 U/L    Alk. phosphatase 83 45 - 117 U/L    Protein, total 6.2 (L) 6.4 - 8.2 g/dL    Albumin 3.9 3.5 - 5.0 g/dL    Globulin 2.3 2.0 - 4.0 g/dL    A-G Ratio 1.7 1.1 - 2.2     CBC WITH 3 PART DIFF    Collection Time: 02/22/15  9:56 AM   Result Value Ref Range    WBC 12.9 (H) 3.6 - 11.0 K/uL    RBC 3.20 (L) 3.80 - 5.20 M/uL    HGB 10.0 (L) 11.5 - 16.0 g/dL    HCT 30.3 (L) 35.0 - 47.0 %    MCV 94.7 80.0 - 99.0 FL    MCH 31.3 26.0 - 34.0 PG    MCHC 33.0 30.0 - 36.5 g/dL    RDW 17.3 (H) 11.8 - 15.8 %    PLATELET 146 (L) 150 - 400 K/uL    NEUTROPHILS 82 (H) 32 - 75 %    MIXED CELLS 7 3.2 - 16.9 %    LYMPHOCYTES 11 (L) 12 - 49 %    ABS. NEUTROPHILS 10.6 (H) 1.8 - 8.0 K/UL    ABS. MIXED CELLS 0.9 0.2 - 1.2 K/uL    ABS. LYMPHOCYTES 1.4 0.8 - 3.5 K/UL    DF AUTOMATED     POC CHEM8    Collection Time: 02/22/15 10:04 AM   Result Value Ref Range    Calcium, ionized (POC) 1.21 1.12 - 1.32 MMOL/L     Sodium (POC) 142 136 - 145 MMOL/L    Potassium (POC) 4.1 3.5 - 5.1 MMOL/L    Chloride (POC) 106 98 - 107 MMOL/L    CO2 (POC) 25 21 - 32 MMOL/L    Anion gap (POC) 16 (H) 5 - 15 mmol/L    Glucose (POC) 81 65 - 105 MG/DL    BUN (POC) 19 9 -  20 MG/DL    Creatinine (POC) 0.8 0.6 - 1.3 MG/DL    GFR-AA (POC) >60 >60 ml/min/1.40m    GFR, non-AA (POC) >60 >60 ml/min/1.748m   Hemoglobin (POC) 9.5 (L) 11.5 - 16.0 GM/DL    Hematocrit (POC) 28 (L) 35.0 - 47.0 %    Comment Comment Not Indicated.       Medications received:  Aloxi IV  Dexamethasone IV  Neulasta OBI  Herceptin IV  Docetaxel IV  Carboplatin IV    Tolerated treatment well, no adverse reaction noted. Port de-accessed and flushed per protocol. Positive blood return noted.    [id number] accompanied by husband Ron. Next appointment is March 15, 2015 at 9:00.

## 2015-02-22 NOTE — Patient Instructions (Signed)
Come see us in 3 weeks for C#5.

## 2015-02-22 NOTE — Progress Notes (Signed)
Jillian Bean is a 74 y.o. female follow up appointment for breast cancer. Patient states she is currently having blurred vision for the past few weeks.    1. Have you been to the ER, urgent care clinic since your last visit?  Hospitalized since your last visit? No    2. Have you seen or consulted any other health care providers outside of the [place] since your last visit?  Include any pap smears or colon screening. No

## 2015-02-22 NOTE — Progress Notes (Signed)
Problem: Chemotherapy Treatment  Goal: *Hemodynamically stable  Outcome: Progressing Towards Goal  Proceed with Cycle 4 TCH and neulasta OBI

## 2015-02-24 NOTE — Telephone Encounter (Signed)
Call to pt, ID verified x2. Writer called to inform pt appt scheduled for 2D Echo on 03/02/15 at 11am. Pt advised to go to [facility] 600. Pt agreed to date/time with no further questions or concerns.

## 2015-03-02 ENCOUNTER — Institutional Professional Consult (permissible substitution): Admit: 2015-03-02 | Discharge: 2015-03-02 | Payer: MEDICARE | Primary: Internal Medicine

## 2015-03-02 DIAGNOSIS — C50919 Malignant neoplasm of unspecified site of unspecified female breast: Secondary | ICD-10-CM

## 2015-03-10 MED ORDER — PEGFILGRASTIM 6 MG/0.6 ML (DELIVERABLE) WEARABLE SUBCUTANEOUS INJECTOR
6 mg/0. mL | Freq: Once | SUBCUTANEOUS | Status: AC
Start: 2015-03-10 — End: 2015-03-16

## 2015-03-10 MED ORDER — DEXAMETHASONE SODIUM PHOSPHATE 4 MG/ML IJ SOLN
4 mg/mL | Freq: Once | INTRAMUSCULAR | Status: AC | PRN
Start: 2015-03-10 — End: 2015-03-15

## 2015-03-10 MED ORDER — OVERFILL VOLUME
10 mg/mL | Freq: Once | INTRAVENOUS | Status: AC
Start: 2015-03-10 — End: 2015-03-15

## 2015-03-10 MED ORDER — DOCETAXEL 20 MG/2 ML (10 MG/ML) IV SOLN
20210 mg/2 mL (10 mg/mL) | Freq: Once | INTRAVENOUS | Status: AC
Start: 2015-03-10 — End: 2015-03-15

## 2015-03-10 MED ORDER — SODIUM CHLORIDE 0.9 % IV
INTRAVENOUS | Status: AC
Start: 2015-03-10 — End: 2015-03-15

## 2015-03-10 MED ORDER — TRASTUZUMAB 440 MG IV SOLR
440 mg | Freq: Once | INTRAVENOUS | Status: AC
Start: 2015-03-10 — End: 2015-03-15

## 2015-03-10 MED ORDER — PALONOSETRON 0.25 MG/5 ML IV
0.25 mg/5 mL | Freq: Once | INTRAVENOUS | Status: AC
Start: 2015-03-10 — End: 2015-03-15

## 2015-03-10 MED FILL — CARBOPLATIN 10 MG/ML IV SOLN: 10 mg/mL | INTRAVENOUS | Qty: 62

## 2015-03-10 MED FILL — SODIUM CHLORIDE 0.9 % IV: INTRAVENOUS | Qty: 1000

## 2015-03-10 MED FILL — DOCETAXEL 20 MG/2 ML (10 MG/ML) IV SOLN: 20 mg/2 mL (10 mg/mL) | INTRAVENOUS | Qty: 13.6

## 2015-03-10 MED FILL — HERCEPTIN 440 MG INTRAVENOUS SOLUTION: 440 mg | INTRAVENOUS | Qty: 410

## 2015-03-15 ENCOUNTER — Ambulatory Visit: Admit: 2015-03-15 | Discharge: 2015-03-15 | Payer: MEDICARE | Attending: Nurse Practitioner | Primary: Internal Medicine

## 2015-03-15 ENCOUNTER — Inpatient Hospital Stay: Admit: 2015-03-15 | Payer: MEDICARE | Primary: Internal Medicine

## 2015-03-15 DIAGNOSIS — Z5111 Encounter for antineoplastic chemotherapy: Secondary | ICD-10-CM

## 2015-03-15 DIAGNOSIS — C50911 Malignant neoplasm of unspecified site of right female breast: Secondary | ICD-10-CM

## 2015-03-15 LAB — CBC WITH AUTOMATED DIFF
ABS. BASOPHILS: 0 10*3/uL (ref 0.0–0.1)
ABS. EOSINOPHILS: 0 10*3/uL (ref 0.0–0.4)
ABS. LYMPHOCYTES: 1.1 10*3/uL (ref 0.8–3.5)
ABS. MONOCYTES: 0.7 10*3/uL (ref 0.0–1.0)
ABS. NEUTROPHILS: 6.2 10*3/uL (ref 1.8–8.0)
BASOPHILS: 0 % (ref 0–1)
EOSINOPHILS: 0 % (ref 0–7)
HCT: 28.2 % — ABNORMAL LOW (ref 35.0–47.0)
HGB: 9 g/dL — ABNORMAL LOW (ref 11.5–16.0)
LYMPHOCYTES: 13 % (ref 12–49)
MCH: 30.9 PG (ref 26.0–34.0)
MCHC: 31.9 g/dL (ref 30.0–36.5)
MCV: 96.9 FL (ref 80.0–99.0)
MONOCYTES: 8 % (ref 5–13)
NEUTROPHILS: 79 % — ABNORMAL HIGH (ref 32–75)
PLATELET: 85 10*3/uL — ABNORMAL LOW (ref 150–400)
RBC: 2.91 M/uL — ABNORMAL LOW (ref 3.80–5.20)
RDW: 17.7 % — ABNORMAL HIGH (ref 11.5–14.5)
WBC: 8 10*3/uL (ref 3.6–11.0)

## 2015-03-15 LAB — POC CHEM8
Anion gap (POC): 17 mmol/L — ABNORMAL HIGH (ref 5–15)
BUN (POC): 19 MG/DL (ref 9–20)
CO2 (POC): 22 MMOL/L (ref 21–32)
Calcium, ionized (POC): 1.23 MMOL/L (ref 1.12–1.32)
Chloride (POC): 106 MMOL/L (ref 98–107)
Creatinine (POC): 0.8 MG/DL (ref 0.6–1.3)
GFRAA, POC: >60 mL/min/{1.73_m2} (ref >60)
GFRNA, POC: >60 mL/min/{1.73_m2} (ref >60)
Glucose (POC): 89 MG/DL (ref 65–105)
Hematocrit (POC): 26 % — ABNORMAL LOW (ref 35.0–47.0)
Hemoglobin (POC): 8.8 GM/DL — ABNORMAL LOW (ref 11.5–16.0)
Potassium (POC): 3.7 MMOL/L (ref 3.5–5.1)
Sodium (POC): 140 MMOL/L (ref 136–145)

## 2015-03-15 LAB — HEPATIC FUNCTION PANEL
A-G Ratio: 1.7 (ref 1.1–2.2)
ALT (SGPT): 24 U/L (ref 12–78)
AST (SGOT): 15 U/L (ref 15–37)
Albumin: 3.6 g/dL (ref 3.5–5.0)
Alk. phosphatase: 78 U/L (ref 45–117)
Bilirubin, direct: 0.1 MG/DL (ref 0.0–0.2)
Bilirubin, total: 0.3 MG/DL (ref 0.2–1.0)
Globulin: 2.1 g/dL (ref 2.0–4.0)
Protein, total: 5.7 g/dL — ABNORMAL LOW (ref 6.4–8.2)

## 2015-03-15 MED ORDER — SODIUM CHLORIDE 0.9 % INJECTION
INTRAMUSCULAR | Status: AC | PRN
Start: 2015-03-15 — End: 2015-03-16
  Administered 2015-03-15: 13:00:00 via INTRAVENOUS

## 2015-03-15 MED ORDER — SODIUM CHLORIDE 0.9 % IJ SYRG
INTRAMUSCULAR | Status: AC | PRN
Start: 2015-03-15 — End: 2015-03-16
  Administered 2015-03-15 (×3): via INTRAVENOUS

## 2015-03-15 MED ORDER — HEPARIN, PORCINE (PF) 100 UNIT/ML IV SYRINGE
100 unit/mL | INTRAVENOUS | Status: AC | PRN
Start: 2015-03-15 — End: 2015-03-16
  Administered 2015-03-15: 15:00:00 via INTRAVENOUS

## 2015-03-15 MED FILL — SODIUM CHLORIDE 0.9 % INJECTION: INTRAMUSCULAR | Qty: 10

## 2015-03-15 NOTE — Patient Instructions (Addendum)
Come see [name] in 1 week for retry of C#5.

## 2015-03-15 NOTE — Progress Notes (Signed)
[facility]  [address], [place]   [address]  W: [phone]   F: [phone]      F/u HEME/ONC CONSULT      Reason for visit:  evaluation for treatment for breast cancer    Consulting physician:  Dr. Gilford Rile    HPI:   Jillian Bean is a 74 y.o.  female who I was asked to see in consultation at the request of Dr. Elie Confer for evaluation for systemic therapy for breast cancer.    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  Right lumpectomy on 11/10/14 shows IDC, 1.7 cm, gr 3, 0/5 LN, no LVI.  PT1cN0Mx.    TCH: 12/21/14-   C#5 held 03/15/15 due to thrombocytopenia    Interval History:  In today for [facility] #5.  Complains of gr 1 loss of appetite, gr 1-2 constipation, gr 1 diarrhea, gr 2 fatigue, gr 1 insomnia, gr 2 pain, gr 1 neuropathy, gr 1 headache. Pt had an increase in diarrhea with this cycle but did not use any imodium.     DX   Encounter Diagnosis   Name Primary?   ??? Malignant neoplasm of right female breast, unspecified site of breast ([place]) Yes            Past Medical History   Diagnosis Date   ??? Plantar fasciitis 09/01/2007   ??? Colonic polyps 08/31/1998   ??? OA (osteoarthritis) 09/01/2007   ??? Back pain 09/01/2007   ??? Hypercholesteremia 09/01/2007   ??? Hypothyroidism 09/01/2003   ??? S/P colonoscopy 10/10/2007   ??? 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 Hiatal hernia 07/11/2011     Dr.sobieski   ??? 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 Thyroid disease    ??? Coagulation disorder (HCC)      IRON DEFFICIENCY ANEMIA   ??? Nausea & vomiting    ??? Other ill-defined conditions(799.89)      VERTIGO   ??? Cancer [facility])      R breast     Past Surgical History   Procedure Laterality Date   ??? Hx gyn       Hysterectomy in her 51's    ??? Pr breast surgery procedure unlisted  1993     Breast Reduction   ??? Endoscopy, colon, diagnostic  7/12/18/08     7/05 Dr Maudry Diego   ??? Hx other surgical       MINIMALLY INVASIVE LUMBAR DECOMPRESSION   ??? Hx gi       COLONOSCOPY   ??? Hx heent  04/07/10     thyroid biopsy neg dr Leward Quan   ??? Hx heent       wisdom teeth extraction   ??? Hx orthopaedic  08/22/09     left TKR   ??? Hx orthopaedic  08/13/12     right tkr     History     Social History   ??? Marital Status: MARRIED     Spouse Name: N/A   ??? Number of Children: N/A   ??? Years of Education: N/A     Social History Main Topics   ??? Smoking status: Never Smoker    ??? Smokeless tobacco: Never Used   ???  Alcohol Use: No   ??? Drug Use: No   ??? Sexual Activity: Not Currently     Other Topics Concern   ??? None     Social History Narrative     Family History   Problem Relation Age of Onset   ??? Cancer Mother      Breast   ??? Heart Failure Father      MI   ??? Seizures Brother      ms       Current Outpatient Prescriptions   Medication Sig Dispense Refill   ??? magic mouthwash solution Magic mouth wash   Maalox  Lidocaine 2% viscous   Diphenhydramine oral solution   Pharmacy to mix equal portions of ingredients to a total volume as indicated in the dispense amount.  Sig: Swish and spit 15-30 ml every 2-4 hours as needed for mouth pain. Ok to swallow for throat pain. 500 mL 2   ??? docusate sodium (COLACE) 100 mg capsule Take 100 mg by mouth daily.     ??? meloxicam (MOBIC) 7.5 mg tablet TAKE 1 TABLET BY MOUTH DAILY 90 Tab 0   ??? 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 latanoprost (XALATAN) 0.005 % ophthalmic solution Administer  to both eyes nightly.  6   ??? lidocaine-prilocaine (EMLA) topical cream Apply  to affected area as needed for Pain. 30 g 0   ??? dexamethasone (DECADRON) 4 mg tablet 8 mg twice a day the day before and day after chemo 32 Tab 3   ??? alendronate (FOSAMAX) 70 mg tablet Take 1 Tab by mouth Every Thursday. 4 Tab 3    ??? colesevelam (WELCHOL) 625 mg tablet 1 po bid 180 Tab 2   ??? levothyroxine (SYNTHROID) 112 mcg tablet 8 per week 100 Tab 3   ??? aspirin 81 mg chewable tablet Take 81 mg by mouth daily.     ??? multivitamin (ONE A DAY) tablet Take 1 Tab by mouth daily.     ??? cholecalciferol, vitamin d3, (VITAMIN D) 1,000 unit tablet Take  by mouth daily.     ??? ondansetron hcl (ZOFRAN) 8 mg tablet Take 1 Tab by mouth every eight (8) hours as needed for Nausea. 24 Tab 3   ??? prochlorperazine (COMPAZINE) 10 mg tablet Take 1 Tab by mouth every six (6) hours as needed for Nausea. 50 Tab 5     Facility-Administered Medications Ordered in Other Visits   Medication Dose Route Frequency Provider Last Rate Last Dose   ??? sodium chloride 0.9 % injection 10 mL  10 mL IntraVENous PRN Joyce Gross, MD   10 mL at 03/15/15 0920   ??? sodium chloride (NS) flush 10 mL  10 mL IntraVENous PRN Joyce Gross, MD   10 mL at 03/15/15 4270   ??? heparin (porcine) pf 500 Units  500 Units IntraVENous PRN Joyce Gross, MD       ??? 0.9% sodium chloride infusion  25 mL/hr IntraVENous CONTINUOUS Joyce Gross, MD       ??? palonosetron HCl (ALOXI) injection 0.25 mg  0.25 mg IntraVENous ONCE Joyce Gross, MD       ??? dexamethasone (DECADRON) 4 mg/mL injection 12 mg  12 mg IntraVENous ONCE PRN Joyce Gross, MD       ??? pegfilgrastim (NEULASTA) wearable SQ injector 6 mg  6 mg SubCUTAneous ONCE Joyce Gross, MD       ??? trastuzumab (HERCEPTIN)  410 mg in 0.9% sodium chloride 250 mL, overfill volume 25 mL IVPB  410 mg IntraVENous ONCE Joyce Gross, MD       ??? DOCEtaxel (TAXOTERE) 136 mg in 0.9% sodium chloride 250 mL, overfill volume 25 mL chemo infusion  136 mg IntraVENous ONCE Joyce Gross, MD       ??? CARBOplatin (PARAPLATIN) 620 mg in 0.9% sodium chloride 500 mL, overfill volume 50 mL chemo infusion  620 mg IntraVENous ONCE Joyce Gross, MD           Allergies   Allergen Reactions   ??? Zocor [Simvastatin] Other (comments) and Diarrhea      Other reaction(s): Adverse reaction to substance   Myalgias     ??? Celebrex [Celecoxib] Other (comments)     Aggitation   ??? Gabapentin Other (comments)     FELT UNSTEADY ON MY FEET   ??? Levaquin [Levofloxacin] Palpitations   ??? Nexium [Esomeprazole Magnesium] Diarrhea   ??? Oxycontin [Oxycodone] Itching   ??? Pravastatin Nausea and Vomiting   ??? Yellow Dye Hives       Review of Systems    A comprehensive review of systems was performed and all systems were negative except for HPI and for the symptom report form, reviewed and scanned in.    Objective:  Physical Exam:  BP 121/60 mmHg   Pulse 80   Temp(Src) 97.4 ??F (36.3 ??C) (Oral)   Resp 18   Ht '5\' 6"'  (1.676 m)   Wt 151 lb 6.4 oz (68.675 kg)   BMI 24.45 kg/m2   SpO2 97%   LMP 04/13/2010    General:  Alert, cooperative, no distress, appears stated age.   Head:  Normocephalic, without obvious abnormality, atraumatic.   Eyes:  Conjunctivae/corneas clear. PERRL, EOMs intact.   Throat: Lips, mucosa, and tongue normal.    Neck: Supple, symmetrical, trachea midline, no adenopathy, thyroid: no enlargement/tenderness/nodules   Back:   Symmetric, no curvature. ROM normal. No CVA tenderness.   Lungs:   Clear to auscultation bilaterally.   Chest wall:  No tenderness or deformity.   Heart:  Regular rate and rhythm, S1, S2 normal, no murmur, click, rub or gallop.   Breast Exam:  S/p R lumpectomy.    Abdomen:   Soft, non-tender. Bowel sounds normal. No masses,  No organomegaly.   Extremities: Extremities normal, atraumatic, no cyanosis or edema.   Skin: Skin color, texture, turgor normal. No rashes or lesions.   Lymph nodes: Cervical, supraclavicular, and axillary nodes normal.   Neurologic: CNII-XII intact.     Diagnostic Imaging     12/16/14 TTE: EF 71%.  03/02/15 TTE: EF 68%    Lab Results  Lab Results   Component Value Date/Time    WBC 8.0 03/15/2015 09:24 AM    HGB 9.0 03/15/2015 09:24 AM    HCT 28.2 03/15/2015 09:24 AM    PLATELET 85 03/15/2015 09:24 AM     MCV 96.9 03/15/2015 09:24 AM       Lab Results   Component Value Date/Time    SODIUM 142 02/22/2015 09:56 AM    POTASSIUM 4.2 02/22/2015 09:56 AM    CHLORIDE 108 02/22/2015 09:56 AM    CO2 27 02/22/2015 09:56 AM    ANION GAP 7 02/22/2015 09:56 AM    GLUCOSE 80 02/22/2015 09:56 AM    BUN 18 02/22/2015 09:56 AM    CREATININE 0.67 02/22/2015 09:56 AM    BUN/CREATININE RATIO 27 02/22/2015 09:56 AM  GFR EST AA >60 02/22/2015 09:56 AM    GFR EST NON-AA >60 02/22/2015 09:56 AM    CALCIUM 8.7 02/22/2015 09:56 AM    ALT 29 02/22/2015 09:56 AM    AST 17 02/22/2015 09:56 AM    ALK. PHOSPHATASE 83 02/22/2015 09:56 AM    PROTEIN, TOTAL 6.2 02/22/2015 09:56 AM    ALBUMIN 3.9 02/22/2015 09:56 AM    GLOBULIN 2.3 02/22/2015 09:56 AM    A-G RATIO 1.7 02/22/2015 09:56 AM       .    Assessment/Plan:  74 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  PS 0    1. Right Breast cancer stage: IA    Hormonal therapy: not indicated due to receptor (-) status    We explained to the patient that the goal of systemic adjuvant therapy is to improve the chances for cure and decrease the risk of relapse. We explained why a patient can have microscopic cancer spread now even though physical examination, laboratory studies and imaging studies are negative for cancer. We explained that the same treatments used now as adjuvant or preventive treatments rarely if ever are curative in women who develop metastases.     She is in excellent health and thus an adjuvant discussion is warranted.  trastuzumab based therapy corresponds to a 9% overall survival benefit, which is rather larger overall (that corresponds to a nearly 20% DFS benefit).    BCIRG006 suggests equivalency between q.3 week Adriamycin, Cytoxan followed by weekly paclitaxel and trastuzumab compared with the [facility] regimen. However, this study was not powered to show a difference between  these two regimens, and in the NEJM publication, the AC-TH arm showed a numerically advantange (though not statistically significant) to the [facility] arm.?? In this patient, it is completely reasonable to use [facility] approach and avoid the potential cardiotoxicity of the anthracyclines as well as the potential for leukemia.    We discussed the toxicities of docetaxel and carboplatin chemotherapy in detail.?? This chemotherapy frequently causes a low white blood cell count and hospital admissions for treatment of neutropenic fever.?? We explained that we consider the use of growth factors to minimize this risk.?? We explained to the patient that some side effects if they occur only last a few days including nausea, vomiting, stomatitis, arthralgia, myalgia,and allergic reactions to Taxotere.?? We told the patient that severe nausea and vomiting were uncommon and that some side effects,if they occur, will last longer; this includes hair loss, which will be seen in all patients treated with these agents and fatigue,which will be seen in most.?? We also informed that for the patient that heart damage is rare with these agents.?? We explained that carboplatin can rarely cause kidney damage and high frequency hearing loss.?? We provided the patient in detail her information concerning the toxicities of this regimen in addition to her overall discussion.??     Rationale for therapy with trastuzumab was also discussed with the patient including a 50% proportional improvement in disease free survival and also an improvement in overall survival in patients receiving trastuzumab and chemotherapy for HER-2 positive breast cancer.?? The side effects of trastuzumab were discussed including a 4%-5% risk of dropping her ejection fraction while on treatment and about a 1% risk of CHF.?? We discussed that this drug will be used every 3 weeks for remainder of a year following the chemotherapy cycles.?? We will check her EF before chemotherapy and  every 3  months while she is receiving trastuzumab.    She is not a candidate to receive pertuzumab with her stage I disease.    We also discussed the APT study which is 12 weeks of weekly paclitaxel with trastuzumab.      After this discussion, she is agreeable to neoadjuvant TCH q 3 weeks x 6 followed by outback trastuzumab and pertuzumab for the completion of a year of therapy (docetaxel 75 mg/m2; carboplatin auc 6; trastuzumab 81m/kg load then 6 mg/kg).??    Informed consent signed, patient provided with copy.    The patient was given presciptions for emla cream, decadron to take 8 mg bid the day before and day after chemo, zofran and compazine.?? Neulasta?? the following day.    TTE ordered and cardiology referral made, seen by Dr. BMarlon Pelon 12/16/14.?? Port placed by Dr. DElie Conferon 12/20/14. Repeat TTE performed after C#4 on 03/02/15.    [place]#5 held today due to thrombocytopenia, will see her back in 1 week to retry Cycle #5.      2. Emotional well being:  She has excellent support and is coping well with her disease.    3. FH of breast cancer:  BRCA 1/2 [place] testing negative    4. Constipation: Gr 1 present at baseline, using colace daily and miralax and senna prn with improvement    5. Anemia:  Mild; due to chemo; will monitor    6. Insomnia:  Due to steroids, will monitor    7. Diarrhea:  Worse with this cycle, Due to constipation relief, discussed constipation management. Pt not interested in trying imodium or Lomotil.     8. Left leg pain, posterior thigh: Resolved,  LE doppler on 02/03/15 negative    9. Blurry vision: resolved with saline eye drops. Appointment to see opthalmologist 03/22/15.     10. Thrombocytopenia: due to chemo, plts 85 today. Will hold chemo today and bring her back in 1 week to retry C#5.     Thank you for this consult.  All of the patient's questions were answered today.              Follow-up Disposition:  Return in about 1 week (around 03/22/2015) for labs, sand/Josiel Gahm, [facility] [place].     WSonda Rumble MD

## 2015-03-15 NOTE — Progress Notes (Signed)
Jillian Bean is a 74 y.o. female  Chief Complaint   Patient presents with   ??? Breast Cancer

## 2015-03-15 NOTE — Progress Notes (Signed)
[facility] VISIT NOTE    0900  Pt arrived at [facility] ambulatory and in no distress for C5 of TCH.  Assessment completed, pt c/o increased diarrhea, constipation, and fatigue. States her constipation and diarrhea are still managed with medication. No other new complaints at this time.    Left chest port accessed with 0.75 in huber with no difficulty. Positive blood return noted and labs drawn.  Recent Results (from the past 12 hour(s))   CBC WITH AUTOMATED DIFF    Collection Time: 03/15/15  9:24 AM   Result Value Ref Range    WBC 8.0 3.6 - 11.0 K/uL    RBC 2.91 (L) 3.80 - 5.20 M/uL    HGB 9.0 (L) 11.5 - 16.0 g/dL    HCT 28.2 (L) 35.0 - 47.0 %    MCV 96.9 80.0 - 99.0 FL    MCH 30.9 26.0 - 34.0 PG    MCHC 31.9 30.0 - 36.5 g/dL    RDW 17.7 (H) 11.5 - 14.5 %    PLATELET 85 (L) 150 - 400 K/uL    NEUTROPHILS 79 (H) 32 - 75 %    LYMPHOCYTES 13 12 - 49 %    MONOCYTES 8 5 - 13 %    EOSINOPHILS 0 0 - 7 %    BASOPHILS 0 0 - 1 %    ABS. NEUTROPHILS 6.2 1.8 - 8.0 K/UL    ABS. LYMPHOCYTES 1.1 0.8 - 3.5 K/UL    ABS. MONOCYTES 0.7 0.0 - 1.0 K/UL    ABS. EOSINOPHILS 0.0 0.0 - 0.4 K/UL    ABS. BASOPHILS 0.0 0.0 - 0.1 K/UL   POC CHEM8    Collection Time: 03/15/15  9:25 AM   Result Value Ref Range    Calcium, ionized (POC) 1.23 1.12 - 1.32 MMOL/L    Sodium (POC) 140 136 - 145 MMOL/L    Potassium (POC) 3.7 3.5 - 5.1 MMOL/L    Chloride (POC) 106 98 - 107 MMOL/L    CO2 (POC) 22 21 - 32 MMOL/L    Anion gap (POC) 17 (H) 5 - 15 mmol/L    Glucose (POC) 89 65 - 105 MG/DL    BUN (POC) 19 9 - 20 MG/DL    Creatinine (POC) 0.8 0.6 - 1.3 MG/DL    GFR-AA (POC) >60 >60 ml/min/1.47m2    GFR, non-AA (POC) >60 >60 ml/min/1.3m2    Hemoglobin (POC) 8.8 (L) 11.5 - 16.0 GM/DL    Hematocrit (POC) 26 (L) 35.0 - 47.0 %    Comment Comment Not Indicated.     MD notified of pt's platelet level and new orders received to hold treatment today and retry in one week. See connect care for other lab results.    Patient Vitals for the past 12 hrs:    Temp Pulse Resp BP   03/15/15 0900 97.4 ??F (36.3 ??C) 80 18 121/60 mmHg     Port de-accessed and flushed per protocol. Positive blood return noted.    1050  D/C'd from [facility] ambulatory and in no distress accompanied by her husband. Next appointment is 03/22/15 at 0900.

## 2015-03-17 MED ORDER — OVERFILL VOLUME
20210 mg/2 mL (10 mg/mL) | Freq: Once | INTRAVENOUS | Status: DC
Start: 2015-03-17 — End: 2015-03-22

## 2015-03-17 MED ORDER — PEGFILGRASTIM 6 MG/0.6 ML (DELIVERABLE) WEARABLE SUBCUTANEOUS INJECTOR
6 mg/0. mL | Freq: Once | SUBCUTANEOUS | Status: DC
Start: 2015-03-17 — End: 2015-03-22

## 2015-03-17 MED ORDER — SODIUM CHLORIDE 0.9 % IV
INTRAVENOUS | Status: DC
Start: 2015-03-17 — End: 2015-03-22

## 2015-03-17 MED ORDER — ALENDRONATE 70 MG TAB
70 mg | ORAL_TABLET | ORAL | Status: DC
Start: 2015-03-17 — End: 2015-03-22

## 2015-03-17 MED ORDER — OVERFILL VOLUME
440 mg | Freq: Once | INTRAVENOUS | Status: DC
Start: 2015-03-17 — End: 2015-03-22

## 2015-03-17 MED ORDER — OVERFILL VOLUME
10 mg/mL | Freq: Once | INTRAVENOUS | Status: DC
Start: 2015-03-17 — End: 2015-03-22

## 2015-03-17 MED ORDER — DEXAMETHASONE SODIUM PHOSPHATE 10 MG/ML IJ SOLN
10 mg/mL | Freq: Once | INTRAMUSCULAR | Status: DC | PRN
Start: 2015-03-17 — End: 2015-03-22

## 2015-03-17 MED ORDER — PALONOSETRON 0.25 MG/5 ML IV
0.25 mg/5 mL | Freq: Once | INTRAVENOUS | Status: DC
Start: 2015-03-17 — End: 2015-03-22

## 2015-03-17 MED FILL — DOCETAXEL 20 MG/2 ML (10 MG/ML) IV SOLN: 20 mg/2 mL (10 mg/mL) | INTRAVENOUS | Qty: 13.6

## 2015-03-17 MED FILL — CARBOPLATIN 10 MG/ML IV SOLN: 10 mg/mL | INTRAVENOUS | Qty: 54.5

## 2015-03-17 MED FILL — HERCEPTIN 440 MG INTRAVENOUS SOLUTION: 440 mg | INTRAVENOUS | Qty: 410

## 2015-03-17 MED FILL — SODIUM CHLORIDE 0.9 % IV: INTRAVENOUS | Qty: 1000

## 2015-03-22 ENCOUNTER — Ambulatory Visit: Admit: 2015-03-22 | Discharge: 2015-03-22 | Payer: MEDICARE | Attending: Nurse Practitioner | Primary: Internal Medicine

## 2015-03-22 ENCOUNTER — Encounter: Primary: Internal Medicine

## 2015-03-22 ENCOUNTER — Inpatient Hospital Stay: Admit: 2015-03-22 | Payer: MEDICARE | Primary: Internal Medicine

## 2015-03-22 ENCOUNTER — Inpatient Hospital Stay: Payer: MEDICARE | Primary: Internal Medicine

## 2015-03-22 DIAGNOSIS — C50911 Malignant neoplasm of unspecified site of right female breast: Secondary | ICD-10-CM

## 2015-03-22 LAB — CBC WITH 3 PART DIFF
ABS. LYMPHOCYTES: 1.5 10*3/uL (ref 0.8–3.5)
ABS. MIXED CELLS: 0.9 10*3/uL (ref 0.2–1.2)
ABS. NEUTROPHILS: 5.5 10*3/uL (ref 1.8–8.0)
HCT: 29.8 % — ABNORMAL LOW (ref 35.0–47.0)
HGB: 9.9 g/dL — ABNORMAL LOW (ref 11.5–16.0)
LYMPHOCYTES: 19 % (ref 12–49)
MCH: 32.6 PG (ref 26.0–34.0)
MCHC: 33.2 g/dL (ref 30.0–36.5)
MCV: 98 FL (ref 80.0–99.0)
Mixed cells: 11 % (ref 3.2–16.9)
NEUTROPHILS: 70 % (ref 32–75)
PLATELET: 194 10*3/uL (ref 150–400)
RBC: 3.04 M/uL — ABNORMAL LOW (ref 3.80–5.20)
RDW: 16.9 % — ABNORMAL HIGH (ref 11.8–15.8)
WBC: 7.9 10*3/uL (ref 3.6–11.0)

## 2015-03-22 LAB — POC CHEM8
Anion gap (POC): 14 mmol/L (ref 5–15)
BUN (POC): 19 MG/DL (ref 9–20)
CO2 (POC): 24 MMOL/L (ref 21–32)
Calcium, ionized (POC): 1.19 MMOL/L (ref 1.12–1.32)
Chloride (POC): 108 MMOL/L — ABNORMAL HIGH (ref 98–107)
Creatinine (POC): 1 MG/DL (ref 0.6–1.3)
GFRAA, POC: >60 mL/min/{1.73_m2} (ref >60)
GFRNA, POC: 54 mL/min/{1.73_m2} — ABNORMAL LOW (ref >60)
Glucose (POC): 78 MG/DL (ref 65–105)
Hematocrit (POC): 29 % — ABNORMAL LOW (ref 35.0–47.0)
Hemoglobin (POC): 9.9 GM/DL — ABNORMAL LOW (ref 11.5–16.0)
Potassium (POC): 3.5 MMOL/L (ref 3.5–5.1)
Sodium (POC): 141 MMOL/L (ref 136–145)

## 2015-03-22 LAB — HEPATIC FUNCTION PANEL
A-G Ratio: 1.3 (ref 1.1–2.2)
ALT (SGPT): 25 U/L (ref 12–78)
AST (SGOT): 19 U/L (ref 15–37)
Albumin: 3.7 g/dL (ref 3.5–5.0)
Alk. phosphatase: 72 U/L (ref 45–117)
Bilirubin, direct: 0.1 MG/DL (ref 0.0–0.2)
Bilirubin, total: 0.4 MG/DL (ref 0.2–1.0)
Globulin: 2.8 g/dL (ref 2.0–4.0)
Protein, total: 6.5 g/dL (ref 6.4–8.2)

## 2015-03-22 MED ORDER — PEGFILGRASTIM 6 MG/0.6 ML (DELIVERABLE) WEARABLE SUBCUTANEOUS INJECTOR
6 mg/0. mL | Freq: Once | SUBCUTANEOUS | Status: AC
Start: 2015-03-22 — End: 2015-03-22
  Administered 2015-03-22: 21:00:00 via SUBCUTANEOUS

## 2015-03-22 MED ORDER — DOCETAXEL 20 MG/2 ML (10 MG/ML) IV SOLN
20210 mg/2 mL (10 mg/mL) | Freq: Once | INTRAVENOUS | Status: AC
Start: 2015-03-22 — End: 2015-03-22
  Administered 2015-03-22: 18:00:00 via INTRAVENOUS

## 2015-03-22 MED ORDER — OVERFILL VOLUME
10 mg/mL | Freq: Once | INTRAVENOUS | Status: AC
Start: 2015-03-22 — End: 2015-03-22
  Administered 2015-03-22: 19:00:00 via INTRAVENOUS

## 2015-03-22 MED ORDER — SODIUM CHLORIDE 0.9 % IV
440 mg | Freq: Once | INTRAVENOUS | Status: AC
Start: 2015-03-22 — End: 2015-03-22
  Administered 2015-03-22: 17:00:00 via INTRAVENOUS

## 2015-03-22 MED ORDER — SODIUM CHLORIDE 0.9 % IV
10 mg/mL | Freq: Once | INTRAVENOUS | Status: DC
Start: 2015-03-22 — End: 2015-03-22

## 2015-03-22 MED ORDER — DEXAMETHASONE SODIUM PHOSPHATE 4 MG/ML IJ SOLN
4 mg/mL | Freq: Once | INTRAMUSCULAR | Status: AC | PRN
Start: 2015-03-22 — End: 2015-03-22
  Administered 2015-03-22: 16:00:00 via INTRAVENOUS

## 2015-03-22 MED ORDER — PALONOSETRON 0.25 MG/5 ML IV
0.25 mg/5 mL | Freq: Once | INTRAVENOUS | Status: AC
Start: 2015-03-22 — End: 2015-03-22
  Administered 2015-03-22: 16:00:00 via INTRAVENOUS

## 2015-03-22 MED ORDER — SODIUM CHLORIDE 0.9 % IV
INTRAVENOUS | Status: AC
Start: 2015-03-22 — End: 2015-03-22
  Administered 2015-03-22: 16:00:00 via INTRAVENOUS

## 2015-03-22 MED FILL — CARBOPLATIN 10 MG/ML IV SOLN: 10 mg/mL | INTRAVENOUS | Qty: 54.5

## 2015-03-22 MED FILL — SODIUM CHLORIDE 0.9 % IV: INTRAVENOUS | Qty: 1000

## 2015-03-22 MED FILL — DEXAMETHASONE SODIUM PHOSPHATE 4 MG/ML IJ SOLN: 4 mg/mL | INTRAMUSCULAR | Qty: 3

## 2015-03-22 MED FILL — ALOXI 0.25 MG/5 ML INTRAVENOUS SOLUTION: 0.25 mg/5 mL | INTRAVENOUS | Qty: 1

## 2015-03-22 MED FILL — DOCETAXEL 160 MG/16 ML (10 MG/ML) IV SOLN: 160 mg/16 mL (10 mg/mL) | INTRAVENOUS | Qty: 13.6

## 2015-03-22 MED FILL — HERCEPTIN 440 MG INTRAVENOUS SOLUTION: 440 mg | INTRAVENOUS | Qty: 410

## 2015-03-22 MED FILL — CARBOPLATIN 10 MG/ML IV SOLN: 10 mg/mL | INTRAVENOUS | Qty: 45.5

## 2015-03-22 MED FILL — NEULASTA ONPRO 6 MG/0.6 ML WITH WEARABLE SUBCUTANEOUS INJECTOR: 6 mg/0. mL | SUBCUTANEOUS | Qty: 1

## 2015-03-22 NOTE — Progress Notes (Signed)
Outpatient Infusion Center - Chemotherapy Progress Note    8756 Pt admit to [facility] for C5 [facility] ambulatory in stable condition. Assessment completed. No new concerns voiced. Port accessed with 20 G 0.75 inch huber needle, with positive blood return. Labs drawn per policy and sent for results prior to treatment.  Recent Results (from the past 12 hour(s))   HEPATIC FUNCTION PANEL    Collection Time: 03/22/15 10:07 AM   Result Value Ref Range    Protein, total 6.5 6.4 - 8.2 g/dL    Albumin 3.7 3.5 - 5.0 g/dL    Globulin 2.8 2.0 - 4.0 g/dL    A-G Ratio 1.3 1.1 - 2.2      Bilirubin, total 0.4 0.2 - 1.0 MG/DL    Bilirubin, direct 0.1 0.0 - 0.2 MG/DL    Alk. phosphatase 72 45 - 117 U/L    AST 19 15 - 37 U/L    ALT 25 12 - 78 U/L   CBC WITH 3 PART DIFF    Collection Time: 03/22/15 10:07 AM   Result Value Ref Range    WBC 7.9 3.6 - 11.0 K/uL    RBC 3.04 (L) 3.80 - 5.20 M/uL    HGB 9.9 (L) 11.5 - 16.0 g/dL    HCT 29.8 (L) 35.0 - 47.0 %    MCV 98.0 80.0 - 99.0 FL    MCH 32.6 26.0 - 34.0 PG    MCHC 33.2 30.0 - 36.5 g/dL    RDW 16.9 (H) 11.8 - 15.8 %    PLATELET 194 150 - 400 K/uL    NEUTROPHILS 70 32 - 75 %    MIXED CELLS 11 3.2 - 16.9 %    LYMPHOCYTES 19 12 - 49 %    ABS. NEUTROPHILS 5.5 1.8 - 8.0 K/UL    ABS. MIXED CELLS 0.9 0.2 - 1.2 K/uL    ABS. LYMPHOCYTES 1.5 0.8 - 3.5 K/UL    DF AUTOMATED     POC CHEM8    Collection Time: 03/22/15 11:08 AM   Result Value Ref Range    Calcium, ionized (POC) 1.19 1.12 - 1.32 MMOL/L    Sodium (POC) 141 136 - 145 MMOL/L    Potassium (POC) 3.5 3.5 - 5.1 MMOL/L    Chloride (POC) 108 (H) 98 - 107 MMOL/L    CO2 (POC) 24 21 - 32 MMOL/L    Anion gap (POC) 14 5 - 15 mmol/L    Glucose (POC) 78 65 - 105 MG/DL    BUN (POC) 19 9 - 20 MG/DL    Creatinine (POC) 1.0 0.6 - 1.3 MG/DL    GFR-AA (POC) >60 >60 ml/min/1.66m    GFR, non-AA (POC) 54 (L) >60 ml/min/1.754m   Hemoglobin (POC) 9.9 (L) 11.5 - 16.0 GM/DL    Hematocrit (POC) 29 (L) 35.0 - 47.0 %    Comment Comment Not Indicated.          1015- Patient left [facility] for MD office appointment.  [facility]    BP 127/64 mmHg   Pulse 74   Temp(Src) 97 ??F (36.1 ??C)   Resp 18   Ht '5\' 5"'  (1.651 m)   Wt 66.679 kg (147 lb)   BMI 24.46 kg/m2   SpO2 99%   LMP 04/13/2010    Medications:  Aloxi 0.25 MG IVP  Decadron 12 MG IVP  Herceptin 410 MG - infused over 30 minutes  Taxotere 136 MG - infused over 60  minutes  Carboplatin 455 MG - infused over 60 minutes (carbo dose adjusted today)  Neulast 6 MG OBI - placed on back of left arm      1640 Pt tolerated treatment well. Port maintained positive blood return throughout treatment, flushed with positive blood return at conclusion and heparinized per policy prior to removal of huber needle, site covered with guaze and band aid. D/c home ambulatory in no distress. Pt aware of next [facility] appointment scheduled for 04/12/15.

## 2015-03-22 NOTE — Patient Instructions (Signed)
Come see [name] in 3 weeks for #6!

## 2015-03-22 NOTE — Progress Notes (Signed)
[facility]  [address], [place]   [address]  W: [phone]   F: [phone]      F/u HEME/ONC CONSULT      Reason for visit:  evaluation for treatment for breast cancer    Consulting physician:  Dr. Gilford Rile    HPI:   Jillian Bean is a 74 y.o.  female who I was asked to see in consultation at the request of Dr. Elie Confer for evaluation for systemic therapy for breast cancer.    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  Right lumpectomy on 11/10/14 shows IDC, 1.7 cm, gr 3, 0/5 LN, no LVI.  PT1cN0Mx.    TCH: 12/21/14-   C#5 held 03/15/15 due to thrombocytopenia     Interval History:  In today for retry of [name] #5.  Complains of gr 1 loss of appetite, gr 1 constipation, gr 1 fatigue, gr 1 insomnia, gr 1 neuropathy, gr 1 headache. Overall feeling much better today.    DX   Encounter Diagnosis   Name Primary?   ??? Malignant neoplasm of right female breast, unspecified site of breast (Portal) Yes            Past Medical History   Diagnosis Date   ??? Plantar fasciitis 09/01/2007   ??? Colonic polyps 08/31/1998   ??? OA (osteoarthritis) 09/01/2007   ??? Back pain 09/01/2007   ??? Hypercholesteremia 09/01/2007   ??? Hypothyroidism 09/01/2003   ??? S/P colonoscopy 10/10/2007   ??? 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 Hiatal hernia 07/11/2011     Dr.sobieski   ??? 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 Thyroid disease    ??? Coagulation disorder (HCC)      IRON DEFFICIENCY ANEMIA   ??? Nausea & vomiting    ??? Other ill-defined conditions(799.89)      VERTIGO   ??? Cancer [facility])      R breast     Past Surgical History   Procedure Laterality Date   ??? Hx gyn       Hysterectomy in her 28's   ??? Pr breast surgery procedure unlisted  1993     Breast Reduction    ??? Endoscopy, colon, diagnostic  7/12/18/08     7/05 Dr Maudry Diego   ??? Hx other surgical       MINIMALLY INVASIVE LUMBAR DECOMPRESSION   ??? Hx gi       COLONOSCOPY   ??? Hx heent  04/07/10     thyroid biopsy neg dr Leward Quan   ??? Hx heent       wisdom teeth extraction   ??? Hx orthopaedic  08/22/09     left TKR   ??? Hx orthopaedic  08/13/12     right tkr     History     Social History   ??? Marital Status: MARRIED     Spouse Name: N/A   ??? Number of Children: N/A   ??? Years of Education: N/A     Social History Main Topics   ??? Smoking status: Never Smoker    ??? Smokeless tobacco: Never Used   ??? Alcohol Use: No   ??? Drug Use: No   ???  Sexual Activity: Not Currently     Other Topics Concern   ??? None     Social History Narrative     Family History   Problem Relation Age of Onset   ??? Cancer Mother      Breast   ??? Heart Failure Father      MI   ??? Seizures Brother      ms       Current Outpatient Prescriptions   Medication Sig Dispense Refill   ??? docusate sodium (COLACE) 100 mg capsule Take 100 mg by mouth daily.     ??? meloxicam (MOBIC) 7.5 mg tablet TAKE 1 TABLET BY MOUTH DAILY 90 Tab 0   ??? 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 latanoprost (XALATAN) 0.005 % ophthalmic solution Administer  to both eyes nightly.  6   ??? dexamethasone (DECADRON) 4 mg tablet 8 mg twice a day the day before and day after chemo 32 Tab 3   ??? alendronate (FOSAMAX) 70 mg tablet Take 1 Tab by mouth Every Thursday. 4 Tab 3   ??? colesevelam (WELCHOL) 625 mg tablet 1 po bid 180 Tab 2   ??? levothyroxine (SYNTHROID) 112 mcg tablet 8 per week 100 Tab 3   ??? aspirin 81 mg chewable tablet Take 81 mg by mouth daily.     ??? multivitamin (ONE A DAY) tablet Take 1 Tab by mouth daily.     ??? cholecalciferol, vitamin d3, (VITAMIN D) 1,000 unit tablet Take  by mouth daily.     ??? magic mouthwash solution Magic mouth wash   Maalox  Lidocaine 2% viscous   Diphenhydramine oral solution   Pharmacy to mix equal portions of ingredients to a total volume as  indicated in the dispense amount.  Sig: Swish and spit 15-30 ml every 2-4 hours as needed for mouth pain. Ok to swallow for throat pain. 500 mL 2   ??? ondansetron hcl (ZOFRAN) 8 mg tablet Take 1 Tab by mouth every eight (8) hours as needed for Nausea. 24 Tab 3   ??? lidocaine-prilocaine (EMLA) topical cream Apply  to affected area as needed for Pain. 30 g 0   ??? prochlorperazine (COMPAZINE) 10 mg tablet Take 1 Tab by mouth every six (6) hours as needed for Nausea. 50 Tab 5     Facility-Administered Medications Ordered in Other Visits   Medication Dose Route Frequency Provider Last Rate Last Dose   ??? 0.9% sodium chloride infusion  25 mL/hr IntraVENous CONTINUOUS Joyce Gross, MD       ??? palonosetron HCl (ALOXI) injection 0.25 mg  0.25 mg IntraVENous ONCE Joyce Gross, MD       ??? dexamethasone (DECADRON) 4 mg/mL injection 12 mg  12 mg IntraVENous ONCE PRN Joyce Gross, MD       ??? pegfilgrastim (NEULASTA) wearable SQ injector 6 mg  6 mg SubCUTAneous ONCE Joyce Gross, MD       ??? trastuzumab (HERCEPTIN) 410 mg in 0.9% sodium chloride 250 mL, overfill volume 25 mL IVPB  410 mg IntraVENous ONCE Joyce Gross, MD       ??? DOCEtaxel (TAXOTERE) 136 mg in 0.9% sodium chloride 250 mL, overfill volume 25 mL chemo infusion  136 mg IntraVENous ONCE Joyce Gross, MD       ??? CARBOplatin (PARAPLATIN) 545 mg in 0.9% sodium chloride 500 mL, overfill volume 50 mL chemo infusion  545 mg IntraVENous ONCE Orson Ape  Laurena Bering, MD           Allergies   Allergen Reactions   ??? Zocor [Simvastatin] Other (comments) and Diarrhea     Other reaction(s): Adverse reaction to substance   Myalgias     ??? Celebrex [Celecoxib] Other (comments)     Aggitation   ??? Gabapentin Other (comments)     FELT UNSTEADY ON MY FEET   ??? Levaquin [Levofloxacin] Palpitations   ??? Nexium [Esomeprazole Magnesium] Diarrhea   ??? Oxycontin [Oxycodone] Itching   ??? Pravastatin Nausea and Vomiting   ??? Yellow Dye Hives       Review of Systems     A comprehensive review of systems was performed and all systems were negative except for HPI and for the symptom report form, reviewed and scanned in.    Objective:  Physical Exam:  BP 127/64 mmHg   Pulse 74   Temp(Src) 97 ??F (36.1 ??C) (Temporal)   Resp 18   Ht '5\' 5"'  (1.651 m)   Wt 147 lb (66.679 kg)   BMI 24.46 kg/m2   SpO2 99%   LMP 04/13/2010    General:  Alert, cooperative, no distress, appears stated age.   Head:  Normocephalic, without obvious abnormality, atraumatic.   Eyes:  Conjunctivae/corneas clear. PERRL, EOMs intact.   Throat: Lips, mucosa, and tongue normal.    Neck: Supple, symmetrical, trachea midline, no adenopathy, thyroid: no enlargement/tenderness/nodules   Back:   Symmetric, no curvature. ROM normal. No CVA tenderness.   Lungs:   Clear to auscultation bilaterally.   Chest wall:  No tenderness or deformity.   Heart:  Regular rate and rhythm, S1, S2 normal, no murmur, click, rub or gallop.   Breast Exam:  S/p R lumpectomy.    Abdomen:   Soft, non-tender. Bowel sounds normal. No masses,  No organomegaly.   Extremities: Extremities normal, atraumatic, no cyanosis or edema.   Skin: Skin color, texture, turgor normal. No rashes or lesions.   Lymph nodes: Cervical, supraclavicular, and axillary nodes normal.   Neurologic: CNII-XII intact.     Diagnostic Imaging     12/16/14 TTE: EF 71%.  03/02/15 TTE: EF 68%    Lab Results  Lab Results   Component Value Date/Time    WBC 7.9 03/22/2015 10:07 AM    HGB 9.9 03/22/2015 10:07 AM    HCT 29.8 03/22/2015 10:07 AM    PLATELET 194 03/22/2015 10:07 AM    MCV 98.0 03/22/2015 10:07 AM       Lab Results   Component Value Date/Time    SODIUM 142 02/22/2015 09:56 AM    POTASSIUM 4.2 02/22/2015 09:56 AM    CHLORIDE 108 02/22/2015 09:56 AM    CO2 27 02/22/2015 09:56 AM    ANION GAP 7 02/22/2015 09:56 AM    GLUCOSE 80 02/22/2015 09:56 AM    BUN 18 02/22/2015 09:56 AM    CREATININE 0.67 02/22/2015 09:56 AM    BUN/CREATININE RATIO 27 02/22/2015 09:56 AM     GFR EST AA >60 02/22/2015 09:56 AM    GFR EST NON-AA >60 02/22/2015 09:56 AM    CALCIUM 8.7 02/22/2015 09:56 AM    ALT 24 03/15/2015 09:17 AM    AST 15 03/15/2015 09:17 AM    ALK. PHOSPHATASE 78 03/15/2015 09:17 AM    PROTEIN, TOTAL 5.7 03/15/2015 09:17 AM    ALBUMIN 3.6 03/15/2015 09:17 AM    GLOBULIN 2.1 03/15/2015 09:17 AM    A-G RATIO 1.7 03/15/2015 09:17 AM       .  Assessment/Plan:  74 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  PS 0    1. Right Breast cancer stage: IA    Hormonal therapy: not indicated due to receptor (-) status    We explained to the patient that the goal of systemic adjuvant therapy is to improve the chances for cure and decrease the risk of relapse. We explained why a patient can have microscopic cancer spread now even though physical examination, laboratory studies and imaging studies are negative for cancer. We explained that the same treatments used now as adjuvant or preventive treatments rarely if ever are curative in women who develop metastases.     She is in excellent health and thus an adjuvant discussion is warranted.  trastuzumab based therapy corresponds to a 9% overall survival benefit, which is rather larger overall (that corresponds to a nearly 20% DFS benefit).    [id number] suggests equivalency between q.3 week Adriamycin, Cytoxan followed by weekly paclitaxel and trastuzumab compared with the [facility] regimen. However, this study was not powered to show a difference between these two regimens, and in the NEJM publication, the AC-TH arm showed a numerically advantange (though not statistically significant) to the [facility] arm.?? In this patient, it is completely reasonable to use [facility] approach and avoid the potential cardiotoxicity of the anthracyclines as well as the potential for leukemia.    We discussed the toxicities of docetaxel and carboplatin chemotherapy in  detail.?? This chemotherapy frequently causes a low white blood cell count and hospital admissions for treatment of neutropenic fever.?? We explained that we consider the use of growth factors to minimize this risk.?? We explained to the patient that some side effects if they occur only last a few days including nausea, vomiting, stomatitis, arthralgia, myalgia,and allergic reactions to Taxotere.?? We told the patient that severe nausea and vomiting were uncommon and that some side effects,if they occur, will last longer; this includes hair loss, which will be seen in all patients treated with these agents and fatigue,which will be seen in most.?? We also informed that for the patient that heart damage is rare with these agents.?? We explained that carboplatin can rarely cause kidney damage and high frequency hearing loss.?? We provided the patient in detail her information concerning the toxicities of this regimen in addition to her overall discussion.??     Rationale for therapy with trastuzumab was also discussed with the patient including a 50% proportional improvement in disease free survival and also an improvement in overall survival in patients receiving trastuzumab and chemotherapy for HER-2 positive breast cancer.?? The side effects of trastuzumab were discussed including a 4%-5% risk of dropping her ejection fraction while on treatment and about a 1% risk of CHF.?? We discussed that this drug will be used every 3 weeks for remainder of a year following the chemotherapy cycles.?? We will check her EF before chemotherapy and every 3 months while she is receiving trastuzumab.    She is not a candidate to receive pertuzumab with her stage I disease.    We also discussed the APT study which is 12 weeks of weekly paclitaxel with trastuzumab.      After this discussion, she is agreeable to neoadjuvant TCH q 3 weeks x 6 followed by outback trastuzumab and pertuzumab for the completion of a  year of therapy (docetaxel 75 mg/m2; carboplatin auc 6; trastuzumab 21m/kg load then 6 mg/kg).??    Informed consent signed,  patient provided with copy.    The patient was given presciptions for emla cream, decadron to take 8 mg bid the day before and day after chemo, zofran and compazine.?? Neulasta?? the following day.    TTE ordered and cardiology referral made, seen by Dr. Marlon Pel on 12/16/14.?? Port placed by Dr. Elie Confer on 12/20/14. Repeat TTE performed after C#4 on 03/02/15.    Retry [place] #5 today, will see her back in 3 weeks for Cycle #6.      2. Emotional well being:  She has excellent support and is coping well with her disease.    3. FH of breast cancer:  BRCA 1/2 Ambry testing negative    4. Constipation: Gr 1 present at baseline, using colace daily and miralax and senna prn with improvement    5. Anemia:  Mild; due to chemo; will monitor    6. Insomnia:  Due to steroids, will monitor    7. Diarrhea:  improved, Due to constipation relief, discussed constipation management. Pt not interested in trying imodium or Lomotil.     8. Left leg pain, posterior thigh: Resolved,  LE doppler on 02/03/15 negative    9. Blurry vision: resolved with saline eye drops. Appointment to see opthalmologist, 03/31/15.     10. Thrombocytopenia: due to chemo, will continue to monitor.  Improved today    Thank you for this consult.  All of the patient's questions were answered today.              Follow-up Disposition:  Return in about 3 weeks (around 04/12/2015) for labs, sand/Lawayne Hartig, OPIC C#6.    Sonda Rumble, MD

## 2015-03-22 NOTE — Progress Notes (Signed)
Jillian Bean is a 74 y.o. female here for follow-up of breast cancer.

## 2015-03-24 ENCOUNTER — Encounter: Primary: Internal Medicine

## 2015-03-24 MED ORDER — MELOXICAM 7.5 MG TAB
7.5 mg | ORAL_TABLET | ORAL | 0 refills | Status: DC
Start: 2015-03-24 — End: 2015-06-22

## 2015-04-01 ENCOUNTER — Inpatient Hospital Stay: Admit: 2015-04-01 | Payer: MEDICARE | Primary: Internal Medicine

## 2015-04-01 DIAGNOSIS — I890 Lymphedema, not elsewhere classified: Secondary | ICD-10-CM

## 2015-04-01 NOTE — Progress Notes (Signed)
_                                    [facility]  [address]  [address]  [place]  [address]      LYmphedema Therapy  Visit: 4            Daily note                      30 day/10th visit progress note and CMS G CODES  NAME: Jillian Bean  DATE: 04/01/2015    GOALS  1. Order compression sleeve/gauntlet R UE.?? Pt has received Juzo arm sleeve and gauntlet, 1 set to date from [employer]. MET  2.?? Fit compression garments upon receiving and establish effective wear schedule. MET 8/19.  Juzo 20-30mHg garments:Instructed to wear garments for repetitive activities, exercise, airplane travel, heavy housework and yard work.?? Pt may need to increase to daily wear once radiation txs begin.  3.?? Educate patient regarding lymphedema risk reduction practices.??Goal met 01/28/15.??     ????  SUBJECTIVE REPORT:??Patient reports no signs of swelling and admits she does not wear her garment that much. States she will have 1 additional chemo and XRT planned after.  Patient arrives with Right UE sleeve and gauntlet in place.   Pain:?? 0/10   ??  ??    ??  ??    ??  ??    ??  ??    ??  ??   Gait:?? Independent.  ADLs:?? Independent.  Treatment Response:?? Pt has chemo every 3 weeks on Tuesday, scheduled for next week.?? Radiation to begin in August.??  Reviewed packet received at evaluation including skin care products, arm drainage exercises, self MLD handout and resource for community based MLD CMT, and future compression needs should they arise.  Function: Independent.?? Pt performing skin care and household skills independently and instructed in garment don/doff, exercises and self MLD today.  ????  TREATMENT AND OBJECTIVE DATA SUMMARY:??   Patient/Family Education:?? How to perform self measurement with handout and demonstration on R UE with measurements taken and logged. Measuring tape provided.  Patient educated to perform weekly during radiation to  monitor R UE and thereafter may monitor monthly.   ????  Educated in skin care:?? Pt demonstrates good skin care principles??     Educated in exercise:??   Instructed in 3 decongestive exercise routines to alternate 3-7x/week at home.??     Instructed in self MLD:??   Written sequence given and reviewed with patient as well as demonstration and instruction during MLD portion of the session.??     Instructed in don/doff of compression system:??   Pt able to don/doff RUE arm sleeve and gauntlet using donning glove on L hand with verbal cues.??   ????  Therapeutic Activity?? 0 minutes??   Treatment time: ??  Functional Mobility:?? Modified independent.??   Fall risk:?? Normal??   ????  Therapeutic Exercise/Procedure??  minutes??   Treatment time:   Gertie ball exercise program:?? To cont as part HEP   Stick exercise program:?? To cont as part of HEP   Free exercises/ROM:?? To cont as part HEP:  5x each with verbal instruction and good form.??  Clarification on 2 exercises: trunk rotation, and imaginary weight on string.  Demonstrated by therapist with return demonstration by patient with good performance.  Home program:?? Patient to perform daily to BID skin care, deep abdominal breathing, decongestive exercise routine, and wear compression sleeve and gauntlet as needed.??   Rationale:?? Exercises will increase the lymph angiomotoricity and tissue pressure of the skin and thus decrease swelling along with compression.??   ????  Modalities?? 0 minutes??   Treatment time: ??  Vasopneumatic pump:?? Not necessary at this point.??   ????  Manual Lymphatic Drainage (MLD)??  30 minutes??   Treatment time: 1050-1120  Area to decongest:?? RUE??   Sequence used and effectiveness:?? Patient states Indep with use of handout.     Skin/wound care/debridement:?? No open areas or redness noted.??   Upper extremity compression:?? 20-2mHg Juzo arm sleeve size II Max long and 10M gauntlet.    Patient was instructed on how to self measure with demo and performance.  Log and tape measure provided.     Patient was educated on the requirement to wash and dry the garment daily (which she had NOT been performing) and of the following:??    Compression garment routine:  2. ??Apply daytime compression garments to clean, dry extremity first thing in the morning, EVERY MORNING.?? ??  3. Wear garments until bedtime, adjusting throughout the day as needed should garment wrinkle or crease.?? Problem areas can be at joint, and top of garment, ensuring proximal aspect of garment is positioned appropriately on extremity.  4. Launder compression garments nightly either by hand or washing machine in a garment bag or pillowcase.?? Use mild detergent with NO FABRIC SOFTENER, NO BLEACH, NO WOOLITE.?? Wash in cool/warm water.?? ??  5. Dry garment in dryer on low heat right side out in garment bag or pillowcase.  6. Daytime compression garments are NOT safe to sleep in, so remove at bedtime.?? ??  7. Perform skin care to extremity, cleansing skin daily with soap and water, and using low pH lotion, upward strokes to stimulate lymphatic vessels. ??  8. Apply night time compression garment as instructed to wear while sleeping, adjusting throughout the night as needed for comfort.  9. Perform exercise program with daytime compression stockings on.  10.   Signs/Symptoms of infection include:?? Fever, flu-like symptoms, pain/redness/warmth in extremity, sometimes with increase in swelling present.?? Stop wearing your compression garment if this occurs.?? Call your doctor immediately or go to the Emergency room to address potential infection.    Remove compression with changes in circulation or numbness in extremity, different from what you may experience when not wearing compression garment, pain, unexplained shortness of breath.??   Kinesiotaping:?? NA??   Girth/Volume measurement:?? Repeat volumetric measurements taken today compared to evaluation.  Today she reduced by 118 mL compared to initial  evaluation data.?? No palpable lymphedema noted.?? RUE 4% less than LUE and pt is R hand dominant.??   ????  TOTAL TREATMENT?? 30 mins??     In compliance with CMS???s Claims Based Outcome Reporting, the following G-code set was chosen for this patient based on their primary functional limitation being treated:    The outcome measure chosen to determine the severity of the functional limitation was the LLIS with a score of 1 which was correlated with the impairment scale.    ? Other PT/OT Primary Functional Limitations:    [id number]- CURRENT STATUS: CI - 1%-19% impaired, limited or restricted   [id number]- GOAL STATUS:  CI - 1%-19% impaired, limited or restricted   [id number]- D/C STATUS:  CI - 1%-19% impaired, limited or restricted             ????  ASSESSMENT:??   Treatment effectiveness and tolerance:?? Patient instructed in self- measurement and continued compression guidelines during xrt with compression garment  She reports Indep with HEP programs and MLD with handouts. Goals met and patient agrees to self monitor and return should problems arise.     Progress toward goals:?? Goals Met.    ????  PLAN OF CARE:??   Changes to the plan of care:?? Patient to self monitor RUE while ongoing chemo and radiation treatments and f/u in 3 months or prior should problems arise   Frequency:?? F/u in 3 months                 Zissy Hamlett H. Renelda Mom, DPT,CLT

## 2015-04-05 ENCOUNTER — Encounter: Payer: MEDICARE | Primary: Internal Medicine

## 2015-04-07 MED ORDER — DEXAMETHASONE SODIUM PHOSPHATE 10 MG/ML IJ SOLN
10 mg/mL | Freq: Once | INTRAMUSCULAR | Status: AC | PRN
Start: 2015-04-07 — End: 2015-04-12
  Administered 2015-04-12: 15:00:00 via INTRAVENOUS

## 2015-04-07 MED ORDER — OVERFILL VOLUME
10 mg/mL | Freq: Once | INTRAVENOUS | Status: DC
Start: 2015-04-07 — End: 2015-04-12

## 2015-04-07 MED ORDER — SODIUM CHLORIDE 0.9 % IV
INTRAVENOUS | Status: AC
Start: 2015-04-07 — End: 2015-04-12
  Administered 2015-04-12: 15:00:00 via INTRAVENOUS

## 2015-04-07 MED ORDER — OVERFILL VOLUME
440 mg | Freq: Once | INTRAVENOUS | Status: AC
Start: 2015-04-07 — End: 2015-04-12
  Administered 2015-04-12: 16:00:00 via INTRAVENOUS

## 2015-04-07 MED ORDER — SODIUM CHLORIDE 0.9 % IV
20210 mg/2 mL (10 mg/mL) | Freq: Once | INTRAVENOUS | Status: AC
Start: 2015-04-07 — End: 2015-04-12
  Administered 2015-04-12: 17:00:00 via INTRAVENOUS

## 2015-04-07 MED ORDER — PALONOSETRON 0.25 MG/5 ML IV
0.25 mg/5 mL | Freq: Once | INTRAVENOUS | Status: AC
Start: 2015-04-07 — End: 2015-04-12
  Administered 2015-04-12: 15:00:00 via INTRAVENOUS

## 2015-04-07 MED ORDER — PEGFILGRASTIM 6 MG/0.6 ML (DELIVERABLE) WEARABLE SUBCUTANEOUS INJECTOR
6 mg/0. mL | Freq: Once | SUBCUTANEOUS | Status: AC
Start: 2015-04-07 — End: 2015-04-12
  Administered 2015-04-12: 20:00:00 via SUBCUTANEOUS

## 2015-04-07 MED FILL — SODIUM CHLORIDE 0.9 % IV: INTRAVENOUS | Qty: 1000

## 2015-04-07 MED FILL — CARBOPLATIN 10 MG/ML IV SOLN: 10 mg/mL | INTRAVENOUS | Qty: 45.5

## 2015-04-12 ENCOUNTER — Ambulatory Visit: Admit: 2015-04-12 | Discharge: 2015-04-12 | Payer: MEDICARE | Attending: Nurse Practitioner | Primary: Internal Medicine

## 2015-04-12 ENCOUNTER — Encounter: Attending: Nurse Practitioner | Primary: Internal Medicine

## 2015-04-12 ENCOUNTER — Inpatient Hospital Stay: Admit: 2015-04-12 | Payer: MEDICARE | Primary: Internal Medicine

## 2015-04-12 DIAGNOSIS — C50911 Malignant neoplasm of unspecified site of right female breast: Secondary | ICD-10-CM

## 2015-04-12 LAB — HEPATIC FUNCTION PANEL
A-G Ratio: 1.4 (ref 1.1–2.2)
ALT (SGPT): 26 U/L (ref 12–78)
AST (SGOT): 17 U/L (ref 15–37)
Albumin: 3.6 g/dL (ref 3.5–5.0)
Alk. phosphatase: 67 U/L (ref 45–117)
Bilirubin, direct: 0.1 MG/DL (ref 0.0–0.2)
Bilirubin, total: 0.4 MG/DL (ref 0.2–1.0)
Globulin: 2.5 g/dL (ref 2.0–4.0)
Protein, total: 6.1 g/dL — ABNORMAL LOW (ref 6.4–8.2)

## 2015-04-12 LAB — POC CHEM8
Anion gap (POC): 16 mmol/L — ABNORMAL HIGH (ref 5–15)
BUN (POC): 20 MG/DL (ref 9–20)
CO2 (POC): 23 MMOL/L (ref 21–32)
Calcium, ionized (POC): 1.2 MMOL/L (ref 1.12–1.32)
Chloride (POC): 107 MMOL/L (ref 98–107)
Creatinine (POC): 0.8 MG/DL (ref 0.6–1.3)
GFRAA, POC: >60 mL/min/{1.73_m2} (ref >60)
GFRNA, POC: >60 mL/min/{1.73_m2} (ref >60)
Glucose (POC): 103 MG/DL (ref 65–105)
Hematocrit (POC): 24 % — ABNORMAL LOW (ref 35.0–47.0)
Hemoglobin (POC): 8.2 GM/DL — ABNORMAL LOW (ref 11.5–16.0)
Potassium (POC): 3.5 MMOL/L (ref 3.5–5.1)
Sodium (POC): 142 MMOL/L (ref 136–145)

## 2015-04-12 LAB — CBC WITH AUTOMATED DIFF
ABS. BASOPHILS: 0 10*3/uL (ref 0.0–0.1)
ABS. EOSINOPHILS: 0 10*3/uL (ref 0.0–0.4)
ABS. LYMPHOCYTES: 1.3 10*3/uL (ref 0.8–3.5)
ABS. MONOCYTES: 0.6 10*3/uL (ref 0.0–1.0)
ABS. NEUTROPHILS: 4.6 10*3/uL (ref 1.8–8.0)
BASOPHILS: 0 % (ref 0–1)
EOSINOPHILS: 0 % (ref 0–7)
HCT: 28.2 % — ABNORMAL LOW (ref 35.0–47.0)
HGB: 9 g/dL — ABNORMAL LOW (ref 11.5–16.0)
LYMPHOCYTES: 20 % (ref 12–49)
MCH: 32.4 PG (ref 26.0–34.0)
MCHC: 31.9 g/dL (ref 30.0–36.5)
MCV: 101.4 FL — ABNORMAL HIGH (ref 80.0–99.0)
MONOCYTES: 9 % (ref 5–13)
NEUTROPHILS: 71 % (ref 32–75)
PLATELET: 119 10*3/uL — ABNORMAL LOW (ref 150–400)
RBC: 2.78 M/uL — ABNORMAL LOW (ref 3.80–5.20)
RDW: 14.6 % — ABNORMAL HIGH (ref 11.5–14.5)
WBC: 6.5 10*3/uL (ref 3.6–11.0)

## 2015-04-12 MED ORDER — OVERFILL VOLUME
10 mg/mL | Freq: Once | INTRAVENOUS | Status: AC
Start: 2015-04-12 — End: 2015-04-12
  Administered 2015-04-12: 19:00:00 via INTRAVENOUS

## 2015-04-12 MED ORDER — HEPARIN, PORCINE (PF) 100 UNIT/ML IV SYRINGE
100 unit/mL | INTRAVENOUS | Status: AC | PRN
Start: 2015-04-12 — End: 2015-04-13
  Administered 2015-04-12: 20:00:00 via INTRAVENOUS

## 2015-04-12 MED ORDER — SODIUM CHLORIDE 0.9 % INJECTION
INTRAMUSCULAR | Status: AC | PRN
Start: 2015-04-12 — End: 2015-04-13
  Administered 2015-04-12: 13:00:00 via INTRAVENOUS

## 2015-04-12 MED ORDER — SODIUM CHLORIDE 0.9 % IJ SYRG
INTRAMUSCULAR | Status: AC | PRN
Start: 2015-04-12 — End: 2015-04-13
  Administered 2015-04-12 (×4): via INTRAVENOUS

## 2015-04-12 MED FILL — DEXAMETHASONE SODIUM PHOSPHATE 10 MG/ML IJ SOLN: 10 mg/mL | INTRAMUSCULAR | Qty: 2

## 2015-04-12 MED FILL — NEULASTA ONPRO 6 MG/0.6 ML WITH WEARABLE SUBCUTANEOUS INJECTOR: 6 mg/0. mL | SUBCUTANEOUS | Qty: 1

## 2015-04-12 MED FILL — CARBOPLATIN 10 MG/ML IV SOLN: 10 mg/mL | INTRAVENOUS | Qty: 54

## 2015-04-12 MED FILL — DOCETAXEL 160 MG/16 ML (10 MG/ML) IV SOLN: 160 mg/16 mL (10 mg/mL) | INTRAVENOUS | Qty: 13.6

## 2015-04-12 MED FILL — SODIUM CHLORIDE 0.9 % INJECTION: INTRAMUSCULAR | Qty: 10

## 2015-04-12 MED FILL — ALOXI 0.25 MG/5 ML INTRAVENOUS SOLUTION: 0.25 mg/5 mL | INTRAVENOUS | Qty: 1

## 2015-04-12 MED FILL — HERCEPTIN 440 MG INTRAVENOUS SOLUTION: 440 mg | INTRAVENOUS | Qty: 410

## 2015-04-12 NOTE — Progress Notes (Signed)
Jillian Bean is a 74 y.o. female here for follow-up of breast cancer.

## 2015-04-12 NOTE — Progress Notes (Signed)
[facility]  [address]   [address]  W: [phone]   F: [phone]      F/u HEME/ONC CONSULT      Reason for visit:  evaluation for treatment for breast cancer    Consulting physician:  Dr. Gilford Rile    HPI:   Jillian Bean is a 74 y.o.  female who I was asked to see in consultation at the request of Dr. Elie Confer for evaluation for systemic therapy for breast cancer.    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  Right lumpectomy on 11/10/14 shows IDC, 1.7 cm, gr 3, 0/5 LN, no LVI.  PT1cN0Mx.    TCH: 12/21/14- 04/12/15  C#5 held 1 week on 03/15/15 due to thrombocytopenia     Interval History:  In today for [facility] #6.  Complains of gr 1 loss of appetite, gr 1 constipation, gr 1 fatigue, gr 1 insomnia, gr 1 neuropathy, gr 1 swelling, gr 1 headache.     DX   Encounter Diagnosis   Name Primary?   ??? Malignant neoplasm of right female breast, unspecified site of breast ([place]) Yes            Past Medical History   Diagnosis Date   ??? Back pain 09/01/2007   ??? Cancer ([place])      R breast   ??? Coagulation disorder ([place])      IRON DEFFICIENCY ANEMIA   ??? Colonic polyps 08/31/1998   ??? 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 Hiatal hernia 07/11/2011     Dr.sobieski   ??? Hypercholesteremia 09/01/2007   ??? Hypothyroidism 09/01/2003   ??? 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 Nausea & vomiting    ??? OA (osteoarthritis) 09/01/2007   ??? Other ill-defined conditions(799.89)      VERTIGO   ??? Plantar fasciitis 09/01/2007   ??? S/P colonoscopy 10/10/2007   ??? Thyroid disease      Past Surgical History   Procedure Laterality Date   ??? Hx gyn       Hysterectomy in her 89's   ??? Pr breast surgery procedure unlisted  1993     Breast Reduction    ??? Endoscopy, colon, diagnostic  7/12/18/08     7/05 Dr Maudry Diego   ??? Hx other surgical       MINIMALLY INVASIVE LUMBAR DECOMPRESSION   ??? Hx gi       COLONOSCOPY   ??? Hx heent  04/07/10     thyroid biopsy neg dr Leward Quan   ??? Hx heent       wisdom teeth extraction   ??? Hx orthopaedic  08/22/09     left TKR   ??? Hx orthopaedic  08/13/12     right tkr     Social History     Social History   ??? Marital status: MARRIED     Spouse name: N/A   ??? Number of children: N/A   ??? Years of education: N/A     Social History Main Topics   ??? Smoking status: Never Smoker   ??? Smokeless tobacco: Never Used   ??? Alcohol use No   ??? Drug use: No   ???  Sexual activity: Not Currently     Other Topics Concern   ??? None     Social History Narrative     Family History   Problem Relation Age of Onset   ??? Cancer Mother      Breast   ??? Heart Failure Father      MI   ??? Seizures Brother      ms       Current Outpatient Prescriptions   Medication Sig Dispense Refill   ??? meloxicam (MOBIC) 7.5 mg tablet TAKE 1 TABLET BY MOUTH DAILY 90 Tab 0   ??? docusate sodium (COLACE) 100 mg capsule Take 100 mg by mouth daily.     ??? 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 latanoprost (XALATAN) 0.005 % ophthalmic solution Administer  to both eyes nightly.  6   ??? dexamethasone (DECADRON) 4 mg tablet 8 mg twice a day the day before and day after chemo 32 Tab 3   ??? alendronate (FOSAMAX) 70 mg tablet Take 1 Tab by mouth Every Thursday. 4 Tab 3   ??? colesevelam (WELCHOL) 625 mg tablet 1 po bid 180 Tab 2   ??? levothyroxine (SYNTHROID) 112 mcg tablet 8 per week 100 Tab 3   ??? aspirin 81 mg chewable tablet Take 81 mg by mouth daily.     ??? multivitamin (ONE A DAY) tablet Take 1 Tab by mouth daily.     ??? cholecalciferol, vitamin d3, (VITAMIN D) 1,000 unit tablet Take  by mouth daily.     ??? magic mouthwash solution Magic mouth wash   Maalox  Lidocaine 2% viscous   Diphenhydramine oral solution   Pharmacy to mix equal portions of ingredients to a total volume as  indicated in the dispense amount.  Sig: Swish and spit 15-30 ml every 2-4 hours as needed for mouth pain. Ok to swallow for throat pain. 500 mL 2   ??? ondansetron hcl (ZOFRAN) 8 mg tablet Take 1 Tab by mouth every eight (8) hours as needed for Nausea. 24 Tab 3   ??? lidocaine-prilocaine (EMLA) topical cream Apply  to affected area as needed for Pain. 30 g 0   ??? prochlorperazine (COMPAZINE) 10 mg tablet Take 1 Tab by mouth every six (6) hours as needed for Nausea. 50 Tab 5     Facility-Administered Medications Ordered in Other Visits   Medication Dose Route Frequency Provider Last Rate Last Dose   ??? heparin (porcine) pf 500 Units  500 Units IntraVENous PRN Annie Paras, MD       ??? sodium chloride (NS) flush 10 mL  10 mL IntraVENous PRN Annie Paras, MD   10 mL at 04/12/15 0925   ??? sodium chloride 0.9 % injection 10 mL  10 mL IntraVENous PRN Annie Paras, MD   10 mL at 04/12/15 4034   ??? 0.9% sodium chloride infusion  25 mL/hr IntraVENous CONTINUOUS Joyce Gross, MD       ??? palonosetron HCl (ALOXI) injection 0.25 mg  0.25 mg IntraVENous ONCE Joyce Gross, MD       ??? dexamethasone (DECADRON) injection 12 mg  12 mg IntraVENous ONCE PRN Joyce Gross, MD       ??? trastuzumab (HERCEPTIN) 410 mg in 0.9% sodium chloride 250 mL, overfill volume 25 mL IVPB  410 mg IntraVENous ONCE Joyce Gross, MD       ??? DOCEtaxel (TAXOTERE) 136 mg in 0.9% sodium chloride  250 mL, overfill volume 25 mL chemo infusion  136 mg IntraVENous ONCE Joyce Gross, MD       ??? CARBOplatin (PARAPLATIN) 455 mg in 0.9% sodium chloride 500 mL, overfill volume 50 mL chemo infusion  455 mg IntraVENous ONCE Joyce Gross, MD       ??? pegfilgrastim (NEULASTA) wearable SQ injector 6 mg  6 mg SubCUTAneous ONCE Joyce Gross, MD           Allergies   Allergen Reactions   ??? Zocor [Simvastatin] Other (comments) and Diarrhea     Other reaction(s): Adverse reaction to substance   Myalgias     ??? Celebrex [Celecoxib] Other (comments)     Aggitation    ??? Gabapentin Other (comments)     FELT UNSTEADY ON MY FEET   ??? Levaquin [Levofloxacin] Palpitations   ??? Nexium [Esomeprazole Magnesium] Diarrhea   ??? Oxycontin [Oxycodone] Itching   ??? Pravastatin Nausea and Vomiting   ??? Yellow Dye Hives       Review of Systems    A comprehensive review of systems was performed and all systems were negative except for HPI and for the symptom report form, reviewed and scanned in.    Objective:  Physical Exam:  Visit Vitals   ??? BP 111/53   ??? Pulse 71   ??? Temp 97.1 ??F (36.2 ??C) (Temporal)   ??? Resp 18   ??? Ht '5\' 6"'  (1.676 m)   ??? Wt 148 lb 11.2 oz (67.4 kg)   ??? LMP 04/13/2010   ??? SpO2 100%   ??? BMI 24 kg/m2       General:  Alert, cooperative, no distress, appears stated age.   Head:  Normocephalic, without obvious abnormality, atraumatic.   Eyes:  Conjunctivae/corneas clear. PERRL, EOMs intact.   Throat: Lips, mucosa, and tongue normal.    Neck: Supple, symmetrical, trachea midline, no adenopathy, thyroid: no enlargement/tenderness/nodules   Back:   Symmetric, no curvature. ROM normal. No CVA tenderness.   Lungs:   Clear to auscultation bilaterally.   Chest wall:  No tenderness or deformity.   Heart:  Regular rate and rhythm, S1, S2 normal, no murmur, click, rub or gallop.   Breast Exam:  S/p R lumpectomy.    Abdomen:   Soft, non-tender. Bowel sounds normal. No masses,  No organomegaly.   Extremities: Extremities normal, atraumatic, no cyanosis or edema.   Skin: Skin color, texture, turgor normal. No rashes or lesions.   Lymph nodes: Cervical, supraclavicular, and axillary nodes normal.   Neurologic: CNII-XII intact.     Diagnostic Imaging     12/16/14 TTE: EF 71%.  03/02/15 TTE: EF 68%    Lab Results  Lab Results   Component Value Date/Time    WBC 7.9 03/22/2015 10:07 AM    HGB 9.9 03/22/2015 10:07 AM    HCT 29.8 03/22/2015 10:07 AM    PLATELET 194 03/22/2015 10:07 AM    MCV 98.0 03/22/2015 10:07 AM       Lab Results   Component Value Date/Time    SODIUM 142 02/22/2015 09:56 AM     POTASSIUM 4.2 02/22/2015 09:56 AM    CHLORIDE 108 02/22/2015 09:56 AM    CO2 27 02/22/2015 09:56 AM    ANION GAP 7 02/22/2015 09:56 AM    GLUCOSE 80 02/22/2015 09:56 AM    BUN 18 02/22/2015 09:56 AM    CREATININE 0.67 02/22/2015 09:56 AM    BUN/CREATININE RATIO 27 02/22/2015 09:56 AM  GFR EST AA >60 02/22/2015 09:56 AM    GFR EST NON-AA >60 02/22/2015 09:56 AM    CALCIUM 8.7 02/22/2015 09:56 AM    ALT 25 03/22/2015 10:07 AM    AST 19 03/22/2015 10:07 AM    ALK. PHOSPHATASE 72 03/22/2015 10:07 AM    PROTEIN, TOTAL 6.5 03/22/2015 10:07 AM    ALBUMIN 3.7 03/22/2015 10:07 AM    GLOBULIN 2.8 03/22/2015 10:07 AM    A-G RATIO 1.3 03/22/2015 10:07 AM       .    Assessment/Plan:  74 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  PS 0    1. Right Breast cancer stage: IA    Hormonal therapy: not indicated due to receptor (-) status    We explained to the patient that the goal of systemic adjuvant therapy is to improve the chances for cure and decrease the risk of relapse. We explained why a patient can have microscopic cancer spread now even though physical examination, laboratory studies and imaging studies are negative for cancer. We explained that the same treatments used now as adjuvant or preventive treatments rarely if ever are curative in women who develop metastases.     She is in excellent health and thus an adjuvant discussion is warranted.  trastuzumab based therapy corresponds to a 9% overall survival benefit, which is rather larger overall (that corresponds to a nearly 20% DFS benefit).    [id number] suggests equivalency between q.3 week Adriamycin, Cytoxan followed by weekly paclitaxel and trastuzumab compared with the [facility] regimen. However, this study was not powered to show a difference between these two regimens, and in the NEJM publication, the AC-TH arm showed a numerically advantange (though not statistically significant) to the [facility]  arm.?? In this patient, it is completely reasonable to use [facility] approach and avoid the potential cardiotoxicity of the anthracyclines as well as the potential for leukemia.    We discussed the toxicities of docetaxel and carboplatin chemotherapy in detail.?? This chemotherapy frequently causes a low white blood cell count and hospital admissions for treatment of neutropenic fever.?? We explained that we consider the use of growth factors to minimize this risk.?? We explained to the patient that some side effects if they occur only last a few days including nausea, vomiting, stomatitis, arthralgia, myalgia,and allergic reactions to Taxotere.?? We told the patient that severe nausea and vomiting were uncommon and that some side effects,if they occur, will last longer; this includes hair loss, which will be seen in all patients treated with these agents and fatigue,which will be seen in most.?? We also informed that for the patient that heart damage is rare with these agents.?? We explained that carboplatin can rarely cause kidney damage and high frequency hearing loss.?? We provided the patient in detail her information concerning the toxicities of this regimen in addition to her overall discussion.??     Rationale for therapy with trastuzumab was also discussed with the patient including a 50% proportional improvement in disease free survival and also an improvement in overall survival in patients receiving trastuzumab and chemotherapy for HER-2 positive breast cancer.?? The side effects of trastuzumab were discussed including a 4%-5% risk of dropping her ejection fraction while on treatment and about a 1% risk of CHF.?? We discussed that this drug will be used every 3 weeks for remainder of a year following the chemotherapy cycles.?? We will check her EF before chemotherapy and  every 3 months while she is receiving trastuzumab.    She is not a candidate to receive pertuzumab with her stage I disease.     We also discussed the APT study which is 12 weeks of weekly paclitaxel with trastuzumab.      After this discussion, she is agreeable to adjuvant TCH q 3 weeks x 6 followed by outback trastuzumab for the completion of a year of therapy (docetaxel 75 mg/m2; carboplatin auc 6; trastuzumab 39m/kg load then 6 mg/kg).??    Informed consent signed, patient provided with copy.    The patient was given presciptions for emla cream, decadron to take 8 mg bid the day before and day after chemo, zofran and compazine.?? Neulasta?? the following day.    TTE ordered and cardiology referral made, seen by Dr. BMarlon Pelon 12/16/14.?? Port placed by Dr. DElie Conferon 12/20/14. Repeat TTE performed after C#4 on 03/02/15. Next TTE due 06/02/15.    She has seen Dr. WGilford Rile To see again 04/19/15.    [place]#6 today.  Will see her back in 3 weeks to start outback HP.     2. Emotional well being:  She has excellent support and is coping well with her disease.    3. FH of breast cancer:  BRCA 1/2 [place] testing negative    4. Constipation: Gr 1 present at baseline, using colace daily and miralax and senna prn with improvement    5. Anemia:  Mild; due to chemo; will monitor    6. Insomnia:  Due to steroids, will monitor    7. Diarrhea:  improved, Due to constipation relief, discussed constipation management. Pt not interested in trying imodium or Lomotil.     8. Left leg pain, posterior thigh: Resolved,  LE doppler on 02/03/15 negative    9. Blurry vision: resolved with saline eye drops. Appointment to see opthalmologist, 03/31/15.     10. Thrombocytopenia: due to chemo, will continue to monitor.      Thank you for this consult.  All of the patient's questions were answered today.              Follow-up Disposition:  Return in about 3 weeks (around 05/03/2015) for fu, sand/Chuong [employer], opic outback h.    WSonda Rumble MD

## 2015-04-12 NOTE — Progress Notes (Signed)
[facility] VISIT NOTE    0900  Pt arrived at [facility] ambulatory and in no distress for C6 TCH.  Echo done 03/02/15 with EF 68%.  Assessment completed, patient's only complaint is constipation for a few days following each chemo cycle, but managed well with Miralax as needed.    Blood pressure 111/53, pulse 71, temperature 97.1 ??F (36.2 ??C), resp. rate 18, height '5\' 6"'  (1.676 m), weight 67.4 kg (148 lb 11.2 oz), last menstrual period 04/13/2010, SpO2 100 %.    Left chest port accessed with 0.75 in huber no difficulty. Positive blood return noted and labs drawn.  Patient proceeded to MD clinic visit.    0945 Patient returned from MD clinic visit.  New carboplatin dose orders received from Dr. Laurena Bering.  Lab results are within treatment parameters.  Recent Results (from the past 12 hour(s))   HEPATIC FUNCTION PANEL    Collection Time: 04/12/15  9:23 AM   Result Value Ref Range    Protein, total 6.1 (L) 6.4 - 8.2 g/dL    Albumin 3.6 3.5 - 5.0 g/dL    Globulin 2.5 2.0 - 4.0 g/dL    A-G Ratio 1.4 1.1 - 2.2      Bilirubin, total 0.4 0.2 - 1.0 MG/DL    Bilirubin, direct 0.1 0.0 - 0.2 MG/DL    Alk. phosphatase 67 45 - 117 U/L    AST 17 15 - 37 U/L    ALT PENDING U/L   POC CHEM8    Collection Time: 04/12/15  9:29 AM   Result Value Ref Range    Calcium, ionized (POC) 1.20 1.12 - 1.32 MMOL/L    Sodium (POC) 142 136 - 145 MMOL/L    Potassium (POC) 3.5 3.5 - 5.1 MMOL/L    Chloride (POC) 107 98 - 107 MMOL/L    CO2 (POC) 23 21 - 32 MMOL/L    Anion gap (POC) 16 (H) 5 - 15 mmol/L    Glucose (POC) 103 65 - 105 MG/DL    BUN (POC) 20 9 - 20 MG/DL    Creatinine (POC) 0.8 0.6 - 1.3 MG/DL    GFR-AA (POC) >60 >60 ml/min/1.46m    GFR, non-AA (POC) >60 >60 ml/min/1.719m   Hemoglobin (POC) 8.2 (L) 11.5 - 16.0 GM/DL    Hematocrit (POC) 24 (L) 35.0 - 47.0 %    Comment Comment Not Indicated.     CBC WITH AUTOMATED DIFF    Collection Time: 04/12/15  9:33 AM   Result Value Ref Range    WBC 6.5 3.6 - 11.0 K/uL    RBC 2.78 (L) 3.80 - 5.20 M/uL     HGB 9.0 (L) 11.5 - 16.0 g/dL    HCT 28.2 (L) 35.0 - 47.0 %    MCV 101.4 (H) 80.0 - 99.0 FL    MCH 32.4 26.0 - 34.0 PG    MCHC 31.9 30.0 - 36.5 g/dL    RDW 14.6 (H) 11.5 - 14.5 %    PLATELET 119 (L) 150 - 400 K/uL    NEUTROPHILS 71 32 - 75 %    LYMPHOCYTES 20 12 - 49 %    MONOCYTES 9 5 - 13 %    EOSINOPHILS 0 0 - 7 %    BASOPHILS 0 0 - 1 %    ABS. NEUTROPHILS 4.6 1.8 - 8.0 K/UL    ABS. LYMPHOCYTES 1.3 0.8 - 3.5 K/UL    ABS. MONOCYTES 0.6 0.0 - 1.0 K/UL    ABS.  EOSINOPHILS 0.0 0.0 - 0.4 K/UL    ABS. BASOPHILS 0.0 0.0 - 0.1 K/UL     Medications received:  NS IV@ kvo  Aloxi IVP  Dexamethasone IVP  Herceptin IV  Taxotere IV  Carboplatin IV  Neulasta OBI to left upper arm    Blood pressure 139/78, pulse 78, temperature 97.3 ??F (36.3 ??C), resp. rate 18, height '5\' 6"'  (1.676 m), weight 67.4 kg (148 lb 11.2 oz), last menstrual period 04/13/2010, SpO2 100 %.    Tolerated treatment well, no adverse reaction noted. Port de-accessed and flushed per protocol. Positive blood return noted.    1600  D/C'd from [facility] ambulatory and in no distress accompanied by her husband. Next appointment is 05/03/15 at [place].

## 2015-04-12 NOTE — Patient Instructions (Signed)
Come see [name] in 3 weeks to start outback HP.

## 2015-04-13 MED ORDER — ALENDRONATE 70 MG TAB
70 mg | ORAL_TABLET | ORAL | 0 refills | Status: DC
Start: 2015-04-13 — End: 2015-05-03

## 2015-04-19 ENCOUNTER — Inpatient Hospital Stay: Admit: 2015-04-19 | Primary: Internal Medicine

## 2015-04-27 MED ORDER — SODIUM CHLORIDE 0.9 % IV
INTRAVENOUS | Status: AC
Start: 2015-04-27 — End: 2015-05-03
  Administered 2015-05-03: 15:00:00 via INTRAVENOUS

## 2015-04-27 MED ORDER — OVERFILL VOLUME
440 mg | Freq: Once | INTRAVENOUS | Status: AC
Start: 2015-04-27 — End: 2015-05-03
  Administered 2015-05-03: 15:00:00 via INTRAVENOUS

## 2015-04-27 MED FILL — SODIUM CHLORIDE 0.9 % IV: INTRAVENOUS | Qty: 1000

## 2015-05-03 ENCOUNTER — Inpatient Hospital Stay: Admit: 2015-05-03 | Payer: MEDICARE | Primary: Internal Medicine

## 2015-05-03 ENCOUNTER — Ambulatory Visit: Admit: 2015-05-03 | Discharge: 2015-05-03 | Payer: MEDICARE | Attending: Nurse Practitioner | Primary: Internal Medicine

## 2015-05-03 DIAGNOSIS — C50911 Malignant neoplasm of unspecified site of right female breast: Secondary | ICD-10-CM

## 2015-05-03 MED ORDER — SODIUM CHLORIDE 0.9 % INJECTION
INTRAMUSCULAR | Status: AC | PRN
Start: 2015-05-03 — End: 2015-05-04
  Administered 2015-05-03: 13:00:00 via INTRAVENOUS

## 2015-05-03 MED ORDER — SODIUM CHLORIDE 0.9 % IJ SYRG
INTRAMUSCULAR | Status: AC | PRN
Start: 2015-05-03 — End: 2015-05-04
  Administered 2015-05-03 (×3): via INTRAVENOUS

## 2015-05-03 MED ORDER — HEPARIN, PORCINE (PF) 100 UNIT/ML IV SYRINGE
100 unit/mL | INTRAVENOUS | Status: AC | PRN
Start: 2015-05-03 — End: 2015-05-04
  Administered 2015-05-03: 16:00:00 via INTRAVENOUS

## 2015-05-03 MED FILL — HERCEPTIN 440 MG INTRAVENOUS SOLUTION: 440 mg | INTRAVENOUS | Qty: 405

## 2015-05-03 NOTE — Progress Notes (Signed)
[facility]  [address], [place]   [address]  W: [phone]   F: [phone]      F/u HEME/ONC CONSULT      Reason for visit:  evaluation for treatment for breast cancer    Consulting physician:  Dr. Gilford Rile    HPI:   Jillian Bean is a 74 y.o.  female who I was asked to see in consultation at the request of Dr. Elie Confer for evaluation for systemic therapy for breast cancer.    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  Right lumpectomy on 11/10/14 shows IDC, 1.7 cm, gr 3, 0/5 LN, no LVI.  PT1cN0Mx.    TCH: 12/21/14- 04/12/15  C#5 held 1 week on 03/15/15 due to thrombocytopenia   Outback Herceptin: 05/03/15-    Interval History:  In today to start outback herceptin. Complains of gr 1 constipation, gr 1 fatigue, gr 1 insomnia, gr 1 neuropathy, gr 1 swelling.    DX   Encounter Diagnoses   Name Primary?   ??? Malignant neoplasm of right female breast, unspecified site of breast ([place]) Yes   ??? Encounter for monitoring cardiotoxic drug therapy             Past Medical History   Diagnosis Date   ??? Back pain 09/01/2007   ??? Cancer (HCC)      R breast   ??? Coagulation disorder ([place])      IRON DEFFICIENCY ANEMIA   ??? Colonic polyps 08/31/1998   ??? 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 Hiatal hernia 07/11/2011     Dr.sobieski   ??? Hypercholesteremia 09/01/2007   ??? Hypothyroidism 09/01/2003   ??? 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 Nausea & vomiting    ??? OA (osteoarthritis) 09/01/2007   ??? Other ill-defined conditions(799.89)      VERTIGO   ??? Plantar fasciitis 09/01/2007   ??? S/P colonoscopy 10/10/2007   ??? Thyroid disease      Past Surgical History   Procedure Laterality Date   ??? Hx gyn       Hysterectomy in her 19's   ??? Pr breast surgery procedure unlisted  1993      Breast Reduction   ??? Endoscopy, colon, diagnostic  7/12/18/08     7/05 Dr Maudry Diego   ??? Hx other surgical       MINIMALLY INVASIVE LUMBAR DECOMPRESSION   ??? Hx gi       COLONOSCOPY   ??? Hx heent  04/07/10     thyroid biopsy neg dr Leward Quan   ??? Hx heent       wisdom teeth extraction   ??? Hx orthopaedic  08/22/09     left TKR   ??? Hx orthopaedic  08/13/12     right tkr     Social History     Social History   ??? Marital status: MARRIED     Spouse name: N/A   ??? Number of children: N/A   ??? Years of education: N/A     Social History Main Topics   ??? Smoking status: Never Smoker   ??? Smokeless tobacco: Never Used   ??? Alcohol use No   ???  Drug use: No   ??? Sexual activity: Not Currently     Other Topics Concern   ??? None     Social History Narrative     Family History   Problem Relation Age of Onset   ??? Cancer Mother      Breast   ??? Heart Failure Father      MI   ??? Seizures Brother      ms       Current Outpatient Prescriptions   Medication Sig Dispense Refill   ??? meloxicam (MOBIC) 7.5 mg tablet TAKE 1 TABLET BY MOUTH DAILY 90 Tab 0   ??? docusate sodium (COLACE) 100 mg capsule Take 100 mg by mouth daily.     ??? 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 latanoprost (XALATAN) 0.005 % ophthalmic solution Administer  to both eyes nightly.  6   ??? dexamethasone (DECADRON) 4 mg tablet 8 mg twice a day the day before and day after chemo 32 Tab 3   ??? alendronate (FOSAMAX) 70 mg tablet Take 1 Tab by mouth Every Thursday. 4 Tab 3   ??? colesevelam (WELCHOL) 625 mg tablet 1 po bid 180 Tab 2   ??? levothyroxine (SYNTHROID) 112 mcg tablet 8 per week 100 Tab 3   ??? aspirin 81 mg chewable tablet Take 81 mg by mouth daily.     ??? multivitamin (ONE A DAY) tablet Take 1 Tab by mouth daily.     ??? cholecalciferol, vitamin d3, (VITAMIN D) 1,000 unit tablet Take  by mouth daily.     ??? magic mouthwash solution Magic mouth wash   Maalox  Lidocaine 2% viscous   Diphenhydramine oral solution   Pharmacy to mix equal portions of ingredients to a total volume as  indicated in the dispense amount.  Sig: Swish and spit 15-30 ml every 2-4 hours as needed for mouth pain. Ok to swallow for throat pain. 500 mL 2   ??? ondansetron hcl (ZOFRAN) 8 mg tablet Take 1 Tab by mouth every eight (8) hours as needed for Nausea. 24 Tab 3   ??? lidocaine-prilocaine (EMLA) topical cream Apply  to affected area as needed for Pain. 30 g 0   ??? prochlorperazine (COMPAZINE) 10 mg tablet Take 1 Tab by mouth every six (6) hours as needed for Nausea. 50 Tab 5     Facility-Administered Medications Ordered in Other Visits   Medication Dose Route Frequency Provider Last Rate Last Dose   ??? heparin (porcine) pf 500 Units  500 Units IntraVENous PRN Annie Paras, MD       ??? sodium chloride (NS) flush 10 mL  10 mL IntraVENous PRN Annie Paras, MD   10 mL at 05/03/15 0914   ??? sodium chloride 0.9 % injection 10 mL  10 mL IntraVENous PRN Annie Paras, MD   10 mL at 05/03/15 0914   ??? 0.9% sodium chloride infusion  25 mL/hr IntraVENous CONTINUOUS Joyce Gross, MD       ??? trastuzumab (HERCEPTIN) 405 mg in 0.9% sodium chloride 250 mL, overfill volume 25 mL IVPB  405 mg IntraVENous ONCE Joyce Gross, MD           Allergies   Allergen Reactions   ??? Zocor [Simvastatin] Other (comments) and Diarrhea     Other reaction(s): Adverse reaction to substance   Myalgias     ??? Celebrex [Celecoxib] Other (comments)     Aggitation   ???  Gabapentin Other (comments)     FELT UNSTEADY ON MY FEET   ??? Levaquin [Levofloxacin] Palpitations   ??? Nexium [Esomeprazole Magnesium] Diarrhea   ??? Oxycontin [Oxycodone] Itching   ??? Pravastatin Nausea and Vomiting   ??? Yellow Dye Hives       Review of Systems    A comprehensive review of systems was performed and all systems were negative except for HPI and for the symptom report form, reviewed and scanned in.    Objective:  Physical Exam:  Visit Vitals   ??? BP 126/69   ??? Pulse 78   ??? Temp 97.3 ??F (36.3 ??C) (Temporal)   ??? Resp 18   ??? Ht '5\' 6"'  (1.676 m)   ??? Wt 148 lb (67.1 kg)   ??? LMP 04/13/2010    ??? SpO2 97%   ??? BMI 23.89 kg/m2       General:  Alert, cooperative, no distress, appears stated age.   Head:  Normocephalic, without obvious abnormality, atraumatic.   Eyes:  Conjunctivae/corneas clear. PERRL, EOMs intact.   Throat: Lips, mucosa, and tongue normal.    Neck: Supple, symmetrical, trachea midline, no adenopathy, thyroid: no enlargement/tenderness/nodules   Back:   Symmetric, no curvature. ROM normal. No CVA tenderness.   Lungs:   Clear to auscultation bilaterally.   Chest wall:  No tenderness or deformity.   Heart:  Regular rate and rhythm, S1, S2 normal, no murmur, click, rub or gallop.   Breast Exam:  S/p R lumpectomy.    Abdomen:   Soft, non-tender. Bowel sounds normal. No masses,  No organomegaly.   Extremities: Extremities normal, atraumatic, no cyanosis or edema.   Skin: Skin color, texture, turgor normal. No rashes or lesions.   Lymph nodes: Cervical, supraclavicular, and axillary nodes normal.   Neurologic: CNII-XII intact.     Diagnostic Imaging     12/16/14 TTE: EF 71%.  03/02/15 TTE: EF 68%    Lab Results  Lab Results   Component Value Date/Time    WBC 6.5 04/12/2015 09:33 AM    HGB 9.0 04/12/2015 09:33 AM    HCT 28.2 04/12/2015 09:33 AM    PLATELET 119 04/12/2015 09:33 AM    MCV 101.4 04/12/2015 09:33 AM       Lab Results   Component Value Date/Time    SODIUM 142 02/22/2015 09:56 AM    POTASSIUM 4.2 02/22/2015 09:56 AM    CHLORIDE 108 02/22/2015 09:56 AM    CO2 27 02/22/2015 09:56 AM    ANION GAP 7 02/22/2015 09:56 AM    GLUCOSE 80 02/22/2015 09:56 AM    BUN 18 02/22/2015 09:56 AM    CREATININE 0.67 02/22/2015 09:56 AM    BUN/CREATININE RATIO 27 02/22/2015 09:56 AM    GFR EST AA >60 02/22/2015 09:56 AM    GFR EST NON-AA >60 02/22/2015 09:56 AM    CALCIUM 8.7 02/22/2015 09:56 AM    ALT 26 04/12/2015 09:23 AM    AST 17 04/12/2015 09:23 AM    ALK. PHOSPHATASE 67 04/12/2015 09:23 AM    PROTEIN, TOTAL 6.1 04/12/2015 09:23 AM    ALBUMIN 3.6 04/12/2015 09:23 AM    GLOBULIN 2.5 04/12/2015 09:23 AM     A-G RATIO 1.4 04/12/2015 09:23 AM       .    Assessment/Plan:  74 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  PS 0    1. Right Breast  cancer stage: IA    Hormonal therapy: not indicated due to receptor (-) status    We explained to the patient that the goal of systemic adjuvant therapy is to improve the chances for cure and decrease the risk of relapse. We explained why a patient can have microscopic cancer spread now even though physical examination, laboratory studies and imaging studies are negative for cancer. We explained that the same treatments used now as adjuvant or preventive treatments rarely if ever are curative in women who develop metastases.     She is in excellent health and thus an adjuvant discussion is warranted.  trastuzumab based therapy corresponds to a 9% overall survival benefit, which is rather larger overall (that corresponds to a nearly 20% DFS benefit).     Rationale for therapy with trastuzumab was also discussed with the patient including a 50% proportional improvement in disease free survival and also an improvement in overall survival in patients receiving trastuzumab and chemotherapy for HER-2 positive breast cancer.?? The side effects of trastuzumab were discussed including a 4%-5% risk of dropping her ejection fraction while on treatment and about a 1% risk of CHF.?? We discussed that this drug will be used every 3 weeks for remainder of a year following the chemotherapy cycles.?? We will check her EF before chemotherapy and every 3 months while she is receiving trastuzumab.    She is not a candidate to receive pertuzumab with her stage I disease.    We also discussed the APT study which is 12 weeks of weekly paclitaxel with trastuzumab.      After this discussion, she is agreeable to adjuvant TCH q 3 weeks x 6 followed by outback trastuzumab for the completion of a year of therapy  (docetaxel 75 mg/m2; carboplatin auc 6; trastuzumab 85m/kg load then 6 mg/kg).??    Informed consent signed, patient provided with copy.    The patient was given presciptions for emla cream, decadron to take 8 mg bid the day before and day after chemo, zofran and compazine.?? Neulasta?? the following day.    TTE ordered and cardiology referral made, seen by Dr. BMarlon Pelon 12/16/14.?? Port placed by Dr. DElie Conferon 12/20/14. Repeat TTE performed after C#4 on 03/02/15. Next TTE due 06/02/15, ordered today.    She has seen Dr. WGilford Rile To start radiation in the next few weeks.     Next mammogram due March 2017.     Outback herceptin #1, will see her back in 6 weeks for #3.    2. Emotional well being:  She has excellent support and is coping well with her disease.    3. FH of breast cancer:  BRCA 1/2 Ambry testing negative    4. Insomnia: Much better, Due to steroids, will monitor    5. Left leg pain, posterior thigh: Resolved,  LE doppler on 02/03/15 negative    6. Blurry vision: resolved with saline eye drops. Has seen opthalmologist.     7. Thrombocytopenia: previously due to chemo, will repeat CBC in 6 weeks.       Thank you for this consult.  All of the patient's questions were answered today.              Follow-up Disposition:  Return in about 6 weeks (around 06/14/2015) for fu, sand/Cherity [place], opic hercep 3.    WSonda Rumble MD

## 2015-05-03 NOTE — Progress Notes (Signed)
Jillian Bean is a 74 y.o. female here for follow-up of breast cancer.

## 2015-05-03 NOTE — Patient Instructions (Signed)
Come see us in 6 weeks.

## 2015-05-03 NOTE — Progress Notes (Signed)
[facility] VISIT NOTE    0900  Pt arrived at [facility] ambulatory and in no distress for C1 Herceptin (maintenance).  Last echo done 03/02/15 with EF 68%.  Assessment completed, pt denies any complaints today and is happy about starting to feel better with more energy since completing chemotherapy 3 weeks ago.    Blood pressure 126/69, pulse 78, temperature 97.3 ??F (36.3 ??C), resp. rate 18, height 5\' 6"  (1.676 m), weight 67.1 kg (148 lb), last menstrual period 04/13/2010, SpO2 97 %.    Left chest port accessed with 0.75 in huber no difficulty. Positive blood return noted and labs drawn.  Patient proceeded to MD clinic visit.    1030 Patient returned from MD clinic visit.    Medications received:  Herceptin IV    Blood pressure 133/64, pulse 63, temperature 97.8 ??F (36.6 ??C), resp. rate 18, height 5\' 6"  (1.676 m), weight 67.1 kg (148 lb), last menstrual period 04/13/2010, SpO2 97 %.    Tolerated treatment well, no adverse reaction noted. Port de-accessed and flushed per protocol. Positive blood return noted.    1140  D/C'd from [facility] ambulatory and in no distress. Next appointment is 05/24/15 at [place].

## 2015-05-09 NOTE — Telephone Encounter (Signed)
05/09/15- Returned patients phone call advised her ECHO scheduled for 05/30/15 at 11:00am at [facility]. She verbalized understanding and denied any further questions or concerns.

## 2015-05-09 NOTE — Telephone Encounter (Signed)
Patient left VM stating she is waiting to hear back from someone regarding rescheduling her echo. CB# [phone]. Thanks.

## 2015-05-11 MED ORDER — ALENDRONATE 70 MG TAB
70 mg | ORAL_TABLET | ORAL | 0 refills | Status: DC
Start: 2015-05-11 — End: 2015-05-30

## 2015-05-19 MED ORDER — SODIUM CHLORIDE 0.9 % IV
440 mg | Freq: Once | INTRAVENOUS | Status: AC
Start: 2015-05-19 — End: 2015-05-24
  Administered 2015-05-24: 14:00:00 via INTRAVENOUS

## 2015-05-19 MED ORDER — SODIUM CHLORIDE 0.9 % IV
INTRAVENOUS | Status: AC
Start: 2015-05-19 — End: 2015-05-24
  Administered 2015-05-24: 14:00:00 via INTRAVENOUS

## 2015-05-19 MED FILL — SODIUM CHLORIDE 0.9 % IV: INTRAVENOUS | Qty: 1000

## 2015-05-24 ENCOUNTER — Inpatient Hospital Stay: Admit: 2015-05-24 | Payer: MEDICARE | Primary: Internal Medicine

## 2015-05-24 DIAGNOSIS — Z5111 Encounter for antineoplastic chemotherapy: Secondary | ICD-10-CM

## 2015-05-24 LAB — CBC W/O DIFF
HCT: 30.6 % — ABNORMAL LOW (ref 35.0–47.0)
HGB: 10.2 g/dL — ABNORMAL LOW (ref 11.5–16.0)
MCH: 33.4 PG (ref 26.0–34.0)
MCHC: 33.3 g/dL (ref 30.0–36.5)
MCV: 100.3 FL — ABNORMAL HIGH (ref 80.0–99.0)
PLATELET: 158 10*3/uL (ref 150–400)
RBC: 3.05 M/uL — ABNORMAL LOW (ref 3.80–5.20)
RDW: 12.5 % (ref 11.5–14.5)
WBC: 4.8 10*3/uL (ref 3.6–11.0)

## 2015-05-24 MED ORDER — SODIUM CHLORIDE 0.9 % INJECTION
INTRAMUSCULAR | Status: AC | PRN
Start: 2015-05-24 — End: 2015-05-25
  Administered 2015-05-24: 13:00:00 via INTRAVENOUS

## 2015-05-24 MED ORDER — SODIUM CHLORIDE 0.9 % IJ SYRG
INTRAMUSCULAR | Status: AC | PRN
Start: 2015-05-24 — End: 2015-05-25
  Administered 2015-05-24 (×2): via INTRAVENOUS

## 2015-05-24 MED ORDER — HEPARIN, PORCINE (PF) 100 UNIT/ML IV SYRINGE
100 unit/mL | INTRAVENOUS | Status: AC | PRN
Start: 2015-05-24 — End: 2015-05-25
  Administered 2015-05-24: 15:00:00 via INTRAVENOUS

## 2015-05-24 MED FILL — HERCEPTIN 440 MG INTRAVENOUS SOLUTION: 440 mg | INTRAVENOUS | Qty: 405

## 2015-05-24 NOTE — Telephone Encounter (Signed)
Called the patient and verified ID x 2.  Informed the patient that per Abel Presto, NP that she is able to receive the flu shot.  The patient verbalized understanding and denied any further questions or concerns.

## 2015-05-24 NOTE — Progress Notes (Signed)
[facility] Progress Note    0900: Patient arrived to [facility] ambulatory for scheduled C2 herceptin treatment of breast cancer. Screening assessment done/see flowsheet - feeling better each day!  PAC accessed, labs drawn - no MD appointment today - tx is not lab dependant.  At pt request, I reviewed herceptin - how it works and side effects, handout given, pt very receptive and interactive and verbally states understanding.    Patient received infusion without difficulty.  Medications this visit:  -NS IV KVO  -herceptin IV    Patient Vitals for the past 12 hrs:   Temp Pulse Resp BP   05/24/15 1031 97.4 ??F (36.3 ??C) 67 16 116/62   05/24/15 0906 97.6 ??F (36.4 ??C) 78 16 121/52     Patient has home medication and return appointment for 06/14/15.    1035: Patient left ambulatory in no distress.

## 2015-05-24 NOTE — Telephone Encounter (Signed)
Patient called inquiring if she is okay to get the flu shot. Call back number [phone]

## 2015-05-30 ENCOUNTER — Ambulatory Visit: Admit: 2015-05-30 | Discharge: 2015-05-30 | Payer: MEDICARE | Attending: Nurse Practitioner | Primary: Internal Medicine

## 2015-05-30 ENCOUNTER — Institutional Professional Consult (permissible substitution): Admit: 2015-05-30 | Payer: MEDICARE | Primary: Internal Medicine

## 2015-05-30 DIAGNOSIS — C50911 Malignant neoplasm of unspecified site of right female breast: Secondary | ICD-10-CM

## 2015-05-30 NOTE — Progress Notes (Signed)
[facility]  [address], [place]   [address]  W: [phone]   F: [phone]      F/u HEME/ONC CONSULT      Reason for visit:  evaluation for treatment for breast cancer    Consulting physician:  Dr. Gilford Rile    HPI:   Jillian Bean is a 74 y.o.  female who I was asked to see in consultation at the request of Dr. Elie Confer for evaluation for systemic therapy for breast cancer.    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  Right lumpectomy on 11/10/14 shows IDC, 1.7 cm, gr 3, 0/5 LN, no LVI.  PT1cN0Mx.    TCH: 12/21/14- 04/12/15  C#5 held 1 week on 03/15/15 due to thrombocytopenia   Outback Herceptin: 05/03/15-    Interval History:  In today with rash. Complains of gr 1 itching, skin rash and neuropathy. She has developed a rash on her lower abdomen and between her thighs. It only itches between her legs not on her stomach. She noticed it yesterday morning. She denies new soap, laundry detergent, clothes.     DX   Encounter Diagnosis   Name Primary?   ??? Malignant neoplasm of right female breast, unspecified site of breast ([place]) Yes       Past Medical History   Diagnosis Date   ??? Back pain 09/01/2007   ??? Cancer ([place])      R breast   ??? Coagulation disorder ([place])      IRON DEFFICIENCY ANEMIA   ??? Colonic polyps 08/31/1998   ??? 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 Hiatal hernia 07/11/2011     Dr.sobieski   ??? Hypercholesteremia 09/01/2007   ??? Hypothyroidism 09/01/2003   ??? 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 Nausea & vomiting    ??? OA (osteoarthritis) 09/01/2007   ??? Other ill-defined conditions(799.89)      VERTIGO   ??? Plantar fasciitis 09/01/2007   ??? S/P colonoscopy 10/10/2007   ??? Thyroid disease      Past Surgical History   Procedure Laterality Date    ??? Hx gyn       Hysterectomy in her 28's   ??? Pr breast surgery procedure unlisted  1993     Breast Reduction   ??? Endoscopy, colon, diagnostic  7/12/18/08     7/05 Dr Maudry Diego   ??? Hx other surgical       MINIMALLY INVASIVE LUMBAR DECOMPRESSION   ??? Hx gi       COLONOSCOPY   ??? Hx heent  04/07/10     thyroid biopsy neg dr Leward Quan   ??? Hx heent       wisdom teeth extraction   ??? Hx orthopaedic  08/22/09     left TKR   ??? Hx orthopaedic  08/13/12     right tkr     Social History     Social History   ??? Marital status: MARRIED     Spouse name: N/A   ??? Number of children: N/A   ??? Years of education: N/A     Social History Main Topics   ??? Smoking status: Never Smoker   ??? Smokeless tobacco:  Never Used   ??? Alcohol use No   ??? Drug use: No   ??? Sexual activity: Not Currently     Other Topics Concern   ??? None     Social History Narrative     Family History   Problem Relation Age of Onset   ??? Cancer Mother      Breast   ??? Heart Failure Father      MI   ??? Seizures Brother      ms       Current Outpatient Prescriptions   Medication Sig Dispense Refill   ??? meloxicam (MOBIC) 7.5 mg tablet TAKE 1 TABLET BY MOUTH DAILY 90 Tab 0   ??? docusate sodium (COLACE) 100 mg capsule Take 100 mg by mouth daily.     ??? 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 latanoprost (XALATAN) 0.005 % ophthalmic solution Administer  to both eyes nightly.  6   ??? alendronate (FOSAMAX) 70 mg tablet Take 1 Tab by mouth Every Thursday. 4 Tab 3   ??? colesevelam (WELCHOL) 625 mg tablet 1 po bid 180 Tab 2   ??? levothyroxine (SYNTHROID) 112 mcg tablet 8 per week 100 Tab 3   ??? aspirin 81 mg chewable tablet Take 81 mg by mouth daily.     ??? multivitamin (ONE A DAY) tablet Take 1 Tab by mouth daily.     ??? cholecalciferol, vitamin d3, (VITAMIN D) 1,000 unit tablet Take  by mouth daily.     ??? magic mouthwash solution Magic mouth wash   Maalox  Lidocaine 2% viscous   Diphenhydramine oral solution   Pharmacy to mix equal portions of ingredients to a total volume as  indicated in the dispense amount.  Sig: Swish and spit 15-30 ml every 2-4 hours as needed for mouth pain. Ok to swallow for throat pain. 500 mL 2   ??? ondansetron hcl (ZOFRAN) 8 mg tablet Take 1 Tab by mouth every eight (8) hours as needed for Nausea. 24 Tab 3   ??? lidocaine-prilocaine (EMLA) topical cream Apply  to affected area as needed for Pain. 30 g 0   ??? dexamethasone (DECADRON) 4 mg tablet 8 mg twice a day the day before and day after chemo 32 Tab 3   ??? prochlorperazine (COMPAZINE) 10 mg tablet Take 1 Tab by mouth every six (6) hours as needed for Nausea. 50 Tab 5       Allergies   Allergen Reactions   ??? Zocor [Simvastatin] Other (comments) and Diarrhea     Other reaction(s): Adverse reaction to substance   Myalgias     ??? Celebrex [Celecoxib] Other (comments)     Aggitation   ??? Gabapentin Other (comments)     FELT UNSTEADY ON MY FEET   ??? Levaquin [Levofloxacin] Palpitations   ??? Nexium [Esomeprazole Magnesium] Diarrhea   ??? Oxycontin [Oxycodone] Itching   ??? Pravastatin Nausea and Vomiting   ??? Yellow Dye Hives       Review of Systems    A comprehensive review of systems was performed and all systems were negative except for HPI and for the symptom report form, reviewed and scanned in.    Objective:  Physical Exam:  Visit Vitals   ??? BP 142/53   ??? Pulse 78   ??? Temp 96.4 ??F (35.8 ??C) (Temporal)   ??? Resp 20   ??? Ht '5\' 6"'  (1.676 m)   ??? Wt 145 lb (65.8 kg)   ???  LMP 04/13/2010   ??? SpO2 98%   ??? BMI 23.4 kg/m2       General:  Alert, cooperative, no distress, appears stated age.   Head:  Normocephalic, without obvious abnormality, atraumatic.   Eyes:  Conjunctivae/corneas clear. PERRL, EOMs intact.   Throat: Lips, mucosa, and tongue normal.    Neck: Supple, symmetrical, trachea midline, no adenopathy, thyroid: no enlargement/tenderness/nodules   Back:   Symmetric, no curvature. ROM normal. No CVA tenderness.   Lungs:   Clear to auscultation bilaterally.   Chest wall:  No tenderness or deformity.    Heart:  Regular rate and rhythm, S1, S2 normal, no murmur, click, rub or gallop.   Breast Exam:  S/p R lumpectomy.    Abdomen:   Soft, non-tender. Bowel sounds normal. No masses,  No organomegaly.   Extremities: Extremities normal, atraumatic, no cyanosis or edema.   Skin: Skin color, texture, turgor normal. No rashes or lesions.   Lymph nodes: Cervical, supraclavicular, and axillary nodes normal.   Neurologic: CNII-XII intact.     Diagnostic Imaging     12/16/14 TTE: EF 71%.  03/02/15 TTE: EF 68%    Lab Results  Lab Results   Component Value Date/Time    WBC 4.8 05/24/2015 09:18 AM    HGB 10.2 05/24/2015 09:18 AM    HCT 30.6 05/24/2015 09:18 AM    PLATELET 158 05/24/2015 09:18 AM    MCV 100.3 05/24/2015 09:18 AM       Lab Results   Component Value Date/Time    SODIUM 142 02/22/2015 09:56 AM    POTASSIUM 4.2 02/22/2015 09:56 AM    CHLORIDE 108 02/22/2015 09:56 AM    CO2 27 02/22/2015 09:56 AM    ANION GAP 7 02/22/2015 09:56 AM    GLUCOSE 80 02/22/2015 09:56 AM    BUN 18 02/22/2015 09:56 AM    CREATININE 0.67 02/22/2015 09:56 AM    BUN/CREATININE RATIO 27 02/22/2015 09:56 AM    GFR EST AA >60 02/22/2015 09:56 AM    GFR EST NON-AA >60 02/22/2015 09:56 AM    CALCIUM 8.7 02/22/2015 09:56 AM    ALT 26 04/12/2015 09:23 AM    AST 17 04/12/2015 09:23 AM    ALK. PHOSPHATASE 67 04/12/2015 09:23 AM    PROTEIN, TOTAL 6.1 04/12/2015 09:23 AM    ALBUMIN 3.6 04/12/2015 09:23 AM    GLOBULIN 2.5 04/12/2015 09:23 AM    A-G RATIO 1.4 04/12/2015 09:23 AM       .    Assessment/Plan:  74 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  PS 0    1. Right Breast cancer stage: IA    Hormonal therapy: not indicated due to receptor (-) status    We explained to the patient that the goal of systemic adjuvant therapy is to improve the chances for cure and decrease the risk of relapse. We explained why a patient can have microscopic cancer spread now even though  physical examination, laboratory studies and imaging studies are negative for cancer. We explained that the same treatments used now as adjuvant or preventive treatments rarely if ever are curative in women who develop metastases.     She is in excellent health and thus an adjuvant discussion is warranted.  trastuzumab based therapy corresponds to a 9% overall survival benefit, which is rather larger overall (that corresponds to a nearly 20% DFS benefit).  Rationale for therapy with trastuzumab was also discussed with the patient including a 50% proportional improvement in disease free survival and also an improvement in overall survival in patients receiving trastuzumab and chemotherapy for HER-2 positive breast cancer.?? The side effects of trastuzumab were discussed including a 4%-5% risk of dropping her ejection fraction while on treatment and about a 1% risk of CHF.?? We discussed that this drug will be used every 3 weeks for remainder of a year following the chemotherapy cycles.?? We will check her EF before chemotherapy and every 3 months while she is receiving trastuzumab.    She is not a candidate to receive pertuzumab with her stage I disease.    We also discussed the APT study which is 12 weeks of weekly paclitaxel with trastuzumab.      After this discussion, she is agreeable to adjuvant TCH q 3 weeks x 6 followed by outback trastuzumab for the completion of a year of therapy (docetaxel 75 mg/m2; carboplatin auc 6; trastuzumab 83m/kg load then 6 mg/kg).??    Informed consent signed, patient provided with copy.    The patient was given presciptions for emla cream, decadron to take 8 mg bid the day before and day after chemo, zofran and compazine.?? Neulasta?? the following day.    TTE ordered and cardiology referral made, seen by Dr. BMarlon Pelon 12/16/14.?? Port placed by Dr. DElie Conferon 12/20/14. Repeat TTE performed after C#4 on 03/02/15. Next TTE due 06/02/15, ordered previously.     She has seen Dr. WGilford Rile To complete radiation on 06/03/15.     Next mammogram due March 2017.     Will see her back in 2 weeks for Herceptin #3.    2. Emotional well being:  She has excellent support and is coping well with her disease.    3. FH of breast cancer:  BRCA 1/2 Ambry testing negative    4. Insomnia: Much better, Due to steroids, will monitor    5. Rash: new, diffuse erythema. Appears like an allergic rash to lower abdomen. No itching or burning. I advised her to try benadryl cream and tablets for relief. Will monitor    > 15 min were spent with this patient with > 50% of that time spent in face to face counseling        Thank you for this consult.  All of the patient's questions were answered today.              Follow-up Disposition:  Return in about 2 weeks (around 06/14/2015) for fu, sand/Haston Casebolt, opic hp 3.    WSonda Rumble MD

## 2015-05-30 NOTE — Progress Notes (Signed)
Jillian Bean is a 74 y.o. female here for follow-up of breast cancer.

## 2015-05-30 NOTE — Telephone Encounter (Signed)
Patient called requesting to be seen by Dr. Laurena Bering as a rash has developed on her stomach and in between her legs and she has not ever seen one come up since she started her chemo. Patient has radiation at 10:15 and an echo at 11:00 and said she will come in after that. I informed patient that there will be a wait. Patient verbalized understanding.

## 2015-05-30 NOTE — Patient Instructions (Signed)
Come see us in 2 weeks.

## 2015-06-08 MED ORDER — ALENDRONATE 70 MG TAB
70 mg | ORAL_TABLET | ORAL | 0 refills | Status: DC
Start: 2015-06-08 — End: 2015-06-14

## 2015-06-09 MED ORDER — SODIUM CHLORIDE 0.9 % IV
INTRAVENOUS | Status: AC
Start: 2015-06-09 — End: 2015-06-14
  Administered 2015-06-14: 14:00:00 via INTRAVENOUS

## 2015-06-09 MED ORDER — SODIUM CHLORIDE 0.9 % IV
440 mg | Freq: Once | INTRAVENOUS | Status: AC
Start: 2015-06-09 — End: 2015-06-14
  Administered 2015-06-14: 15:00:00 via INTRAVENOUS

## 2015-06-09 MED FILL — SODIUM CHLORIDE 0.9 % IV: INTRAVENOUS | Qty: 1000

## 2015-06-14 ENCOUNTER — Inpatient Hospital Stay: Admit: 2015-06-14 | Payer: MEDICARE | Primary: Internal Medicine

## 2015-06-14 ENCOUNTER — Ambulatory Visit: Admit: 2015-06-14 | Discharge: 2015-06-14 | Payer: MEDICARE | Attending: Nurse Practitioner | Primary: Internal Medicine

## 2015-06-14 DIAGNOSIS — C50911 Malignant neoplasm of unspecified site of right female breast: Secondary | ICD-10-CM

## 2015-06-14 MED ORDER — SODIUM CHLORIDE 0.9 % IJ SYRG
INTRAMUSCULAR | Status: AC | PRN
Start: 2015-06-14 — End: 2015-06-14
  Administered 2015-06-14 (×2): via INTRAVENOUS

## 2015-06-14 MED ORDER — PREDNISONE 5 MG TABLETS IN A DOSE PACK
5 mg | ORAL_TABLET | ORAL | 0 refills | Status: DC
Start: 2015-06-14 — End: 2015-06-21

## 2015-06-14 MED ORDER — HEPARIN, PORCINE (PF) 100 UNIT/ML IV SYRINGE
100 unit/mL | INTRAVENOUS | Status: AC | PRN
Start: 2015-06-14 — End: 2015-06-15
  Administered 2015-06-14: 16:00:00 via INTRAVENOUS

## 2015-06-14 MED ORDER — SODIUM CHLORIDE 0.9 % INJECTION
INTRAMUSCULAR | Status: AC | PRN
Start: 2015-06-14 — End: 2015-06-15
  Administered 2015-06-14: 13:00:00 via INTRAVENOUS

## 2015-06-14 MED FILL — HERCEPTIN 440 MG INTRAVENOUS SOLUTION: 440 mg | INTRAVENOUS | Qty: 405

## 2015-06-14 NOTE — Progress Notes (Signed)
Problem: Chemotherapy Treatment  Goal: *Chemotherapy regimen followed  Outcome: Progressing Towards Goal  Here for Cycle 3 of herceptin

## 2015-06-14 NOTE — Progress Notes (Signed)
[facility]  [address], [place]   [address]  W: [phone]   F: [phone]      F/u HEME/ONC CONSULT    Reason for visit:  evaluation for treatment for breast cancer    Consulting physician:  Dr. Gilford Rile    HPI:   Jillian Bean is a 74 y.o.  female who I was asked to see in consultation at the request of Dr. Elie Confer for evaluation for systemic therapy for breast cancer.    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  Right lumpectomy on 11/10/14 shows IDC, 1.7 cm, gr 3, 0/5 LN, no LVI.  PT1cN0Mx.    TCH q 3 weeks x 6: 12/21/14- 04/12/15  C#5 held 1 week on 03/15/15 due to thrombocytopenia   Outback Herceptin: 05/03/15-    S/p XRT 06/03/15    Interval History:  Complains of gr 1 itching and gr 1 rash, worsening. On lower abdomen, upper thighs, L wrist, back near bra strap, bilateral elbows.  Started after radiation.  Improves with benadryl cream    DX   Encounter Diagnosis   Name Primary?   ??? Malignant neoplasm of right female breast, unspecified site of breast ([place]) Yes       Past Medical History   Diagnosis Date   ??? Back pain 09/01/2007   ??? Cancer ([place])      R breast   ??? Coagulation disorder ([place])      IRON DEFFICIENCY ANEMIA   ??? Colonic polyps 08/31/1998   ??? 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 Hiatal hernia 07/11/2011     Dr.sobieski   ??? Hypercholesteremia 09/01/2007   ??? Hypothyroidism 09/01/2003   ??? 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 Nausea & vomiting    ??? OA (osteoarthritis) 09/01/2007   ??? Other ill-defined conditions(799.89)      VERTIGO   ??? Plantar fasciitis 09/01/2007   ??? S/P colonoscopy 10/10/2007   ??? Thyroid disease      Past Surgical History   Procedure Laterality Date   ??? Hx gyn       Hysterectomy in her 68's    ??? Pr breast surgery procedure unlisted  1993     Breast Reduction   ??? Endoscopy, colon, diagnostic  7/12/18/08     7/05 Dr Maudry Diego   ??? Hx other surgical       MINIMALLY INVASIVE LUMBAR DECOMPRESSION   ??? Hx gi       COLONOSCOPY   ??? Hx heent  04/07/10     thyroid biopsy neg dr Leward Quan   ??? Hx heent       wisdom teeth extraction   ??? Hx orthopaedic  08/22/09     left TKR   ??? Hx orthopaedic  08/13/12     right tkr     Social History     Social History   ??? Marital status: MARRIED     Spouse name: N/A   ??? Number of children: N/A   ??? Years of education: N/A     Social History Main Topics   ??? Smoking status: Never Smoker   ??? Smokeless tobacco: Never Used   ??? Alcohol use No   ???  Drug use: No   ??? Sexual activity: Not Currently     Other Topics Concern   ??? None     Social History Narrative     Family History   Problem Relation Age of Onset   ??? Cancer Mother      Breast   ??? Heart Failure Father      MI   ??? Seizures Brother      ms       Current Outpatient Prescriptions   Medication Sig Dispense Refill   ??? predniSONE (STERAPRED) 5 mg dose pack See administration instruction per 65m dose pack 21 Tab 0   ??? meloxicam (MOBIC) 7.5 mg tablet TAKE 1 TABLET BY MOUTH DAILY 90 Tab 0   ??? docusate sodium (COLACE) 100 mg capsule Take 100 mg by mouth daily.     ??? 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 latanoprost (XALATAN) 0.005 % ophthalmic solution Administer  to both eyes nightly.  6   ??? alendronate (FOSAMAX) 70 mg tablet Take 1 Tab by mouth Every Thursday. 4 Tab 3   ??? colesevelam (WELCHOL) 625 mg tablet 1 po bid 180 Tab 2   ??? levothyroxine (SYNTHROID) 112 mcg tablet 8 per week 100 Tab 3   ??? aspirin 81 mg chewable tablet Take 81 mg by mouth daily.     ??? multivitamin (ONE A DAY) tablet Take 1 Tab by mouth daily.     ??? cholecalciferol, vitamin d3, (VITAMIN D) 1,000 unit tablet Take  by mouth daily.     ??? lidocaine-prilocaine (EMLA) topical cream Apply  to affected area as needed for Pain. 30 g 0      Facility-Administered Medications Ordered in Other Visits   Medication Dose Route Frequency Provider Last Rate Last Dose   ??? sodium chloride 0.9 % injection 10 mL  10 mL IntraVENous PRN SAnnie Paras MD   10 mL at 06/14/15 01610  ??? heparin (porcine) pf 500 Units  500 Units IntraVENous PRN SAnnie Paras MD       ??? sodium chloride (NS) flush 5-10 mL  5-10 mL IntraVENous PRN SAnnie Paras MD   10 mL at 06/14/15 0925   ??? 0.9% sodium chloride infusion  25 mL/hr IntraVENous CONTINUOUS WJoyce Gross MD 25 mL/hr at 06/14/15 0937 25 mL/hr at 06/14/15 0937   ??? trastuzumab (HERCEPTIN) 405 mg in 0.9% sodium chloride 250 mL, overfill volume 25 mL IVPB  405 mg IntraVENous ONCE WJoyce Gross MD           Allergies   Allergen Reactions   ??? Zocor [Simvastatin] Other (comments) and Diarrhea     Other reaction(s): Adverse reaction to substance   Myalgias     ??? Celebrex [Celecoxib] Other (comments)     Aggitation   ??? Gabapentin Other (comments)     FELT UNSTEADY ON MY FEET   ??? Levaquin [Levofloxacin] Palpitations   ??? Nexium [Esomeprazole Magnesium] Diarrhea   ??? Oxycontin [Oxycodone] Itching   ??? Pravastatin Nausea and Vomiting   ??? Yellow Dye Hives       Review of Systems    A comprehensive review of systems was performed and all systems were negative except for HPI and for the symptom report form, reviewed and scanned in.    Objective:  Physical Exam:  Visit Vitals   ??? BP 113/68   ??? Pulse 81   ??? Temp 97 ??F (36.1 ??C) (Oral)   ???  Resp 18   ??? Ht '5\' 6"'  (1.676 m)   ??? Wt 147 lb 1.6 oz (66.7 kg)   ??? LMP 04/13/2010   ??? SpO2 100%   ??? BMI 23.74 kg/m2       General:  Alert, cooperative, no distress, appears stated age.   Head:  Normocephalic, without obvious abnormality, atraumatic.   Eyes:  Conjunctivae/corneas clear. PERRL, EOMs intact.   Throat: Lips, mucosa, and tongue normal.    Neck: Supple, symmetrical, trachea midline, no adenopathy, thyroid: no enlargement/tenderness/nodules    Back:   Symmetric, no curvature. ROM normal. No CVA tenderness.   Lungs:   Clear to auscultation bilaterally.   Chest wall:  No tenderness or deformity.   Heart:  Regular rate and rhythm, S1, S2 normal, no murmur, click, rub or gallop.   Breast Exam:  S/p R lumpectomy.    Abdomen:   Soft, non-tender. Bowel sounds normal. No masses,  No organomegaly.   Extremities: Extremities normal, atraumatic, no cyanosis or edema.   Skin: Macular rash on elbows, back, abdomen lower, upper thighs, back of legs   Lymph nodes: Cervical, supraclavicular, and axillary nodes normal.   Neurologic: CNII-XII intact.     Diagnostic Imaging     12/16/14 TTE: EF 71%.  03/02/15 TTE: EF 68%  05/30/15 TTE :  EF 65%    Lab Results  Lab Results   Component Value Date/Time    WBC 4.8 05/24/2015 09:18 AM    HGB 10.2 05/24/2015 09:18 AM    HCT 30.6 05/24/2015 09:18 AM    PLATELET 158 05/24/2015 09:18 AM    MCV 100.3 05/24/2015 09:18 AM       Lab Results   Component Value Date/Time    SODIUM 142 02/22/2015 09:56 AM    POTASSIUM 4.2 02/22/2015 09:56 AM    CHLORIDE 108 02/22/2015 09:56 AM    CO2 27 02/22/2015 09:56 AM    ANION GAP 7 02/22/2015 09:56 AM    GLUCOSE 80 02/22/2015 09:56 AM    BUN 18 02/22/2015 09:56 AM    CREATININE 0.67 02/22/2015 09:56 AM    BUN/CREATININE RATIO 27 02/22/2015 09:56 AM    GFR EST AA >60 02/22/2015 09:56 AM    GFR EST NON-AA >60 02/22/2015 09:56 AM    CALCIUM 8.7 02/22/2015 09:56 AM    ALT 26 04/12/2015 09:23 AM    AST 17 04/12/2015 09:23 AM    ALK. PHOSPHATASE 67 04/12/2015 09:23 AM    PROTEIN, TOTAL 6.1 04/12/2015 09:23 AM    ALBUMIN 3.6 04/12/2015 09:23 AM    GLOBULIN 2.5 04/12/2015 09:23 AM    A-G RATIO 1.4 04/12/2015 09:23 AM       .    Assessment/Plan:  74 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  PS 0    1. Right Breast cancer stage: IA    Hormonal therapy: not indicated due to receptor (-) status     We explained to the patient that the goal of systemic adjuvant therapy is to improve the chances for cure and decrease the risk of relapse. We explained why a patient can have microscopic cancer spread now even though physical examination, laboratory studies and imaging studies are negative for cancer. We explained that the same treatments used now as adjuvant or preventive treatments rarely if ever are curative in women who develop metastases.     She is in excellent health and thus  an adjuvant discussion is warranted.  trastuzumab based therapy corresponds to a 9% overall survival benefit, which is rather larger overall (that corresponds to a nearly 20% DFS benefit).     Rationale for therapy with trastuzumab was also discussed with the patient including a 50% proportional improvement in disease free survival and also an improvement in overall survival in patients receiving trastuzumab and chemotherapy for HER-2 positive breast cancer.?? The side effects of trastuzumab were discussed including a 4%-5% risk of dropping her ejection fraction while on treatment and about a 1% risk of CHF.?? We discussed that this drug will be used every 3 weeks for remainder of a year following the chemotherapy cycles.?? We will check her EF before chemotherapy and every 3 months while she is receiving trastuzumab.    She is not a candidate to receive pertuzumab with her stage I disease.    We also discussed the APT study which is 12 weeks of weekly paclitaxel with trastuzumab.      After this discussion, she is agreeable to adjuvant TCH q 3 weeks x 6 followed by outback trastuzumab for the completion of a year of therapy (docetaxel 75 mg/m2; carboplatin auc 6; trastuzumab 17m/kg load then 6 mg/kg).??    Informed consent signed, patient provided with copy.    The patient was given presciptions for emla cream, decadron to take 8 mg bid the day before and day after chemo, zofran and compazine.?? Neulasta?? the following day.     TTE ordered and cardiology referral made, seen by Dr. BMarlon Pelon 12/16/14.?? Port placed by Dr. DElie Conferon 12/20/14. Repeat TTE performed after C#4 on 03/02/15. Next TTE due Dec    Next mammogram due March 2017.     herceptin #3 today, will see her back in 3 weeks for #4    2. Emotional well being:  She has excellent support and is coping well with her disease.    3. FH of breast cancer:  BRCA 1/2 Ambry testing negative    4. Rash: new, worsening, diffuse erythema. Appears like an allergic rash, also in contact areas. + itching; unclear if related to herceptin.  She will see Dr. CRonney Lionthis month, dermatology.   I advised her to try benadryl cream and tablets for relief. Will rx prednisone 5  Mg dose pack.   Will monitor    5. Osteoporosis:  On dexa from 2015; on fosamax from Dr. GWanda Plump     Thank you for this consult.  All of the patient's questions were answered today.        Follow-up Disposition:  Return in 3 weeks (on 07/05/2015).    WSonda Rumble MD

## 2015-06-14 NOTE — Progress Notes (Signed)
[facility] VISIT NOTE    8850  Pt arrived at [facility] ambulatory and in no distress for C3 Herceptin.  Assessment completed, pt c/o a rash on her trunk which she told the MD about last visit and still has not gone away.  Otherwise asymptomatic.  Patient Vitals for the past 12 hrs:   Temp Pulse Resp BP SpO2   06/14/15 1232 97.5 ??F (36.4 ??C) 75 18 128/70 99 %   06/14/15 0906 97 ??F (36.1 ??C) 81 18 113/68 100 %     920 chest port accessed with .75 in huber no difficulty. Positive blood return.  No labs drawn.  Drawn.    Went to see MD.  Pt told him again of the rash and he put her on steroids to see if the problem would resolve.    Medications received:  Herceptin IV    Tolerated treatment well, no adverse reaction noted. Port de-accessed and flushed per protocol. Positive blood return noted.    1235  D/C'd from [facility] ambulatory and in no distress.Marland Kitchen Next appointment is 11/22 at 0900.

## 2015-06-14 NOTE — Progress Notes (Signed)
Jillian Bean is a 74 y.o. female here for follow-up of breast cancer.

## 2015-06-14 NOTE — Patient Instructions (Signed)
Bilateral mammogram in march 2017    Prednisone dose pack for rash

## 2015-06-17 NOTE — Telephone Encounter (Signed)
Patient called and stated that she got a reminder call informing her of her Echocardiogram on 11/10, but she already had one on Oct 10th and she wanted to make sure that she was on the right schedule for her Echos. Please call to cancel the echo on 11/10. If needed, pt's callback number is [phone]. Thanks.

## 2015-06-17 NOTE — Telephone Encounter (Signed)
Called the patient and verified ID x 2.  Informed the patient that per Abel Presto, NP that her next scheduled ECHO is due before her 09/06/15 appointment and inquired what dates and times would be best for her.  The patient stated that any day between 08/22/15 and 08/26/15 around 11:00 am would work.  Informed the patient that once an appointment has been scheduled, this office will let her know.  The patient verbalized understanding and denied any further questions or concerns.    Staff message sent to Caleb Popp requesting to re-schedule the patient's appointment on 06/23/15 to sometime between 08/22/15 sand 08/26/15 around 11:00 am.

## 2015-06-17 NOTE — Telephone Encounter (Signed)
Called the patient and verified ID x 2.  Informed the patient that her February 2017 appointment has been re-scheduled for 08/23/15 at 11:00 am.  The patient verbalized understanding and denied any further questions or concerns.

## 2015-06-21 ENCOUNTER — Inpatient Hospital Stay: Admit: 2015-08-19 | Payer: MEDICARE | Primary: Internal Medicine

## 2015-06-21 ENCOUNTER — Ambulatory Visit: Admit: 2015-06-21 | Discharge: 2015-06-21 | Payer: MEDICARE | Attending: Internal Medicine | Primary: Internal Medicine

## 2015-06-21 DIAGNOSIS — E785 Hyperlipidemia, unspecified: Secondary | ICD-10-CM

## 2015-06-21 NOTE — Progress Notes (Signed)
Two pt identifiers completed, consent obtained, site confirmed with pt. Pt tolerated well.

## 2015-06-21 NOTE — Progress Notes (Signed)
HISTORY OF PRESENT ILLNESS  Jillian Bean is a 74 y.o. female.  HPI  Jillian Bean has been undergoing treatment for her right breast cancer.  She has completed chemo at the end of August and radiation on October 24th with Dr. Gilford Rile.  She developed a rash sometime after completing the chemo.  It is diffuse, mildly itchy, primarily on arms, legs, abdomen.  Unclear if it is from Herceptin.  She has not had fevers, chills, shortness of breath or wheeze with it.  Other than chemo she is not on new meds.  She will be seeing Dr. Ronney Lion in the next few weeks.  I have asked her to stop Fosamax to be sure it is not contributing and drug holiday of this med for six weeks.  Also to consider the Meloxicam as a possible culprit if the Fosamax does not make a difference.    Hypothyroidism.  Remains on thyroid replacement.  Due for levels.    Preventive care.  She asks about Pap.  Her last Pap was 2012.  She has had a partial hysterectomy.  She will return for a GYN visit with me.      Review of Systems   Constitutional: Negative for chills, fever and weight loss.   Respiratory: Negative for cough, shortness of breath and wheezing.    Cardiovascular: Negative for chest pain, palpitations, orthopnea, leg swelling and PND.   Gastrointestinal: Negative for abdominal pain, heartburn and nausea.   Musculoskeletal: Negative for myalgias.   Skin: Positive for itching and rash.   Neurological: Negative for dizziness and headaches.       Physical Exam   Constitutional: She is oriented to person, place, and time. She appears well-developed and well-nourished.   HENT:   Head: Normocephalic and atraumatic.   Neck: Normal range of motion. Neck supple. Carotid bruit is not present. No thyromegaly present.   Cardiovascular: Normal rate, regular rhythm, S1 normal, S2 normal, normal heart sounds and intact distal pulses.    No murmur heard.  Pulmonary/Chest: Effort normal and breath sounds normal. No respiratory  distress. She has no wheezes. She has no rales.   Musculoskeletal: She exhibits no edema.   Neurological: She is alert and oriented to person, place, and time.   Skin: Rash noted.   Diffuse papular mildly erythematous rash of legs and arms and abd   Psychiatric: She has a normal mood and affect. Her behavior is normal.   Nursing note and vitals reviewed.      ASSESSMENT and PLAN  Kiondra was seen today for hypothyroidism.    Diagnoses and all orders for this visit:    Dyslipidemia, goal LDL below 100  -     LIPID PANEL  -     METABOLIC PANEL, COMPREHENSIVE    Iron deficiency anemia due to chronic blood loss  -     CBC WITH AUTOMATED DIFF    Unspecified hypothyroidism  -     TSH 3RD GENERATION    Rash and nonspecific skin eruption-unclear if from herceptin-i asked her to stop the fosamax for 6 weeks and if this does not help with rash then consider stopping mobic for 4 week drug holiday to see if helps rash    Malignant neoplasm of right female breast, unspecified site of breast ([place])

## 2015-06-22 MED ORDER — MELOXICAM 7.5 MG TAB
7.5 mg | ORAL_TABLET | ORAL | 0 refills | Status: DC
Start: 2015-06-22 — End: 2015-08-23

## 2015-06-23 ENCOUNTER — Encounter: Primary: Internal Medicine

## 2015-06-23 LAB — METABOLIC PANEL, COMPREHENSIVE
A-G Ratio: 2.1 (ref 1.1–2.5)
ALT (SGPT): 23 IU/L (ref 0–32)
AST (SGOT): 22 IU/L (ref 0–40)
Albumin: 4.5 g/dL (ref 3.5–4.8)
Alk. phosphatase: 78 IU/L (ref 39–117)
BUN/Creatinine ratio: 34 — ABNORMAL HIGH (ref 11–26)
BUN: 24 mg/dL (ref 8–27)
Bilirubin, total: 0.3 mg/dL (ref 0.0–1.2)
CO2: 25 mmol/L (ref 18–29)
Calcium: 8.9 mg/dL (ref 8.7–10.3)
Chloride: 102 mmol/L (ref 97–106)
Creatinine: 0.71 mg/dL (ref 0.57–1.00)
GFR est AA: 96 mL/min/{1.73_m2} (ref >59)
GFR est non-AA: 84 mL/min/{1.73_m2} (ref >59)
GLOBULIN, TOTAL: 2.1 g/dL (ref 1.5–4.5)
Glucose: 84 mg/dL (ref 65–99)
Potassium: 4.1 mmol/L (ref 3.5–5.2)
Protein, total: 6.6 g/dL (ref 6.0–8.5)
Sodium: 144 mmol/L (ref 136–144)

## 2015-06-23 LAB — CBC WITH AUTOMATED DIFF
ABS. BASOPHILS: 0 10*3/uL (ref 0.0–0.2)
ABS. EOSINOPHILS: 0.2 10*3/uL (ref 0.0–0.4)
ABS. IMM. GRANS.: 0.1 10*3/uL (ref 0.0–0.1)
ABS. MONOCYTES: 0.4 10*3/uL (ref 0.1–0.9)
ABS. NEUTROPHILS: 4.3 10*3/uL (ref 1.4–7.0)
Abs Lymphocytes: 1.2 10*3/uL (ref 0.7–3.1)
BASOPHILS: 0 %
EOSINOPHILS: 3 %
HCT: 34.8 % (ref 34.0–46.6)
HGB: 11.5 g/dL (ref 11.1–15.9)
IMMATURE GRANULOCYTES: 1 %
Lymphocytes: 19 %
MCH: 31.5 pg (ref 26.6–33.0)
MCHC: 33 g/dL (ref 31.5–35.7)
MCV: 95 fL (ref 79–97)
MONOCYTES: 7 %
NEUTROPHILS: 70 %
PLATELET: 172 10*3/uL (ref 150–379)
RBC: 3.65 x10E6/uL — ABNORMAL LOW (ref 3.77–5.28)
RDW: 12.5 % (ref 12.3–15.4)
WBC: 6.2 10*3/uL (ref 3.4–10.8)

## 2015-06-23 LAB — TSH 3RD GENERATION: TSH: 2.55 u[IU]/mL (ref 0.450–4.500)

## 2015-06-23 LAB — LIPID PANEL
Cholesterol, total: 219 mg/dL — ABNORMAL HIGH (ref 100–199)
HDL Cholesterol: 60 mg/dL (ref >39)
LDL, calculated: 132 mg/dL — ABNORMAL HIGH (ref 0–99)
Triglyceride: 134 mg/dL (ref 0–149)
VLDL, calculated: 27 mg/dL (ref 5–40)

## 2015-06-23 LAB — CVD REPORT

## 2015-06-23 NOTE — Progress Notes (Signed)
Message sent about labs

## 2015-06-30 MED ORDER — TRASTUZUMAB 440 MG IV SOLR
440 mg | Freq: Once | INTRAVENOUS | Status: AC
Start: 2015-06-30 — End: 2015-07-05
  Administered 2015-07-05: 15:00:00 via INTRAVENOUS

## 2015-06-30 MED ORDER — SODIUM CHLORIDE 0.9 % IV
INTRAVENOUS | Status: AC
Start: 2015-06-30 — End: 2015-07-05
  Administered 2015-07-05: 15:00:00 via INTRAVENOUS

## 2015-06-30 MED FILL — SODIUM CHLORIDE 0.9 % IV: INTRAVENOUS | Qty: 1000

## 2015-07-05 ENCOUNTER — Ambulatory Visit: Admit: 2015-07-05 | Discharge: 2015-07-05 | Payer: MEDICARE | Attending: Nurse Practitioner | Primary: Internal Medicine

## 2015-07-05 ENCOUNTER — Inpatient Hospital Stay: Admit: 2015-07-05 | Payer: MEDICARE | Primary: Internal Medicine

## 2015-07-05 DIAGNOSIS — C50911 Malignant neoplasm of unspecified site of right female breast: Secondary | ICD-10-CM

## 2015-07-05 MED FILL — HERCEPTIN 440 MG INTRAVENOUS SOLUTION: 440 mg | INTRAVENOUS | Qty: 405

## 2015-07-05 NOTE — Progress Notes (Signed)
[facility] VISIT NOTE    0900  Pt arrived at [facility] ambulatory and in no distress for C5.  Assessment completed, pt has no new concerns. Still has rash on back and arm, but patient said it is getting better. At the end her treatment she developed chills. She stated it  Feels like she is cold. Spoke with [place]  She started maybe running the Ledbetter slower next time.   MD office called X3 no answer.  Chemotherapy Flowsheet 07/05/2015   Cycle C4   Date 07/05/2015   Drug / Regimen Herceptin    Notes -        Left chest port accessed with 0.75 in huber with no difficulty. Positive blood return noted and labs drawn.    Medications received:  Tratuzumab IV    Tolerated treatment well, no adverse reaction noted. Port de-accessed and flushed per protocol. Positive blood return noted.  Patient Vitals for the past 12 hrs:   Temp Pulse Resp BP SpO2   07/05/15 1103 97.6 ??F (36.4 ??C) 91 18 139/73 100 %   07/05/15 0902 98 ??F (36.7 ??C) 84 18 110/67 100 %     1100  D/C'd from [facility] ambulatory and in no distress . Next appointment is 07/19/15  at 11:30am.

## 2015-07-05 NOTE — Progress Notes (Signed)
[facility]  [address] [place]   [address]  W: [phone]   F: [phone]      F/u HEME/ONC CONSULT    Reason for visit:  evaluation for treatment for breast cancer    Consulting physician:  Dr. Gilford Rile    HPI:   Jillian Bean is a 74 y.o.  female who I was asked to see in consultation at the request of Dr. Elie Confer for evaluation for systemic therapy for breast cancer.    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  Right lumpectomy on 11/10/14 shows IDC, 1.7 cm, gr 3, 0/5 LN, no LVI.  PT1cN0Mx.    TCH q 3 weeks x 6: 12/21/14- 04/12/15  C#5 held 1 week on 03/15/15 due to thrombocytopenia   Outback Herceptin: 05/03/15-    S/p XRT 06/03/15    Interval History:  Complains of gr 1 itching and gr 1 rash    DX   Encounter Diagnoses   Name Primary?   ??? Malignant neoplasm of right female breast, unspecified site of breast ([place]) Yes   ??? Encounter for preprocedural cardiovascular examination         Past Medical History   Diagnosis Date   ??? Back pain 09/01/2007   ??? Cancer (HCC)      R breast   ??? Coagulation disorder ([place])      IRON DEFFICIENCY ANEMIA   ??? Colonic polyps 08/31/1998   ??? 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 Hiatal hernia 07/11/2011     Dr.sobieski   ??? Hypercholesteremia 09/01/2007   ??? Hypothyroidism 09/01/2003   ??? 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 Nausea & vomiting    ??? OA (osteoarthritis) 09/01/2007   ??? Other ill-defined conditions(799.89)      VERTIGO   ??? Plantar fasciitis 09/01/2007   ??? S/P colonoscopy 10/10/2007   ??? Thyroid disease      Past Surgical History   Procedure Laterality Date   ??? Hx gyn       Hysterectomy in her 70's   ??? Pr breast surgery procedure unlisted  1993     Breast Reduction    ??? Endoscopy, colon, diagnostic  7/12/18/08     7/05 Dr Maudry Diego   ??? Hx other surgical       MINIMALLY INVASIVE LUMBAR DECOMPRESSION   ??? Hx gi       COLONOSCOPY   ??? Hx heent  04/07/10     thyroid biopsy neg dr Leward Quan   ??? Hx heent       wisdom teeth extraction   ??? Hx orthopaedic  08/22/09     left TKR   ??? Hx orthopaedic  08/13/12     right tkr     Social History     Social History   ??? Marital status: MARRIED     Spouse name: N/A   ??? Number of children: N/A   ??? Years of education: N/A     Social History Main Topics   ??? Smoking status: Never Smoker   ??? Smokeless tobacco: Never Used   ??? Alcohol use No   ??? Drug use: No   ??? Sexual activity: Not Currently  Other Topics Concern   ??? None     Social History Narrative     Family History   Problem Relation Age of Onset   ??? Cancer Mother      Breast   ??? Heart Failure Father      MI   ??? Seizures Brother      ms       Current Outpatient Prescriptions   Medication Sig Dispense Refill   ??? meloxicam (MOBIC) 7.5 mg tablet TAKE 1 TABLET BY MOUTH DAILY 90 Tab 0   ??? docusate sodium (COLACE) 100 mg capsule Take 100 mg by mouth daily.     ??? 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 latanoprost (XALATAN) 0.005 % ophthalmic solution Administer  to both eyes nightly.  6   ??? colesevelam (WELCHOL) 625 mg tablet 1 po bid 180 Tab 2   ??? levothyroxine (SYNTHROID) 112 mcg tablet 8 per week 100 Tab 3   ??? aspirin 81 mg chewable tablet Take 81 mg by mouth daily.     ??? multivitamin (ONE A DAY) tablet Take 1 Tab by mouth daily.     ??? cholecalciferol, vitamin d3, (VITAMIN D) 1,000 unit tablet Take  by mouth daily.     ??? lidocaine-prilocaine (EMLA) topical cream Apply  to affected area as needed for Pain. 30 g 0     Facility-Administered Medications Ordered in Other Visits   Medication Dose Route Frequency Provider Last Rate Last Dose   ??? 0.9% sodium chloride infusion  25 mL/hr IntraVENous CONTINUOUS Joyce Gross, MD        ??? trastuzumab (HERCEPTIN) 405 mg in 0.9% sodium chloride 250 mL, overfill volume 25 mL IVPB  405 mg IntraVENous ONCE Joyce Gross, MD           Allergies   Allergen Reactions   ??? Zocor [Simvastatin] Other (comments) and Diarrhea     Other reaction(s): Adverse reaction to substance   Myalgias     ??? Celebrex [Celecoxib] Other (comments)     Aggitation   ??? Gabapentin Other (comments)     FELT UNSTEADY ON MY FEET   ??? Levaquin [Levofloxacin] Palpitations   ??? Nexium [Esomeprazole Magnesium] Diarrhea   ??? Oxycontin [Oxycodone] Itching   ??? Pravastatin Nausea and Vomiting   ??? Yellow Dye Hives       Review of Systems    A comprehensive review of systems was performed and all systems were negative except for HPI and for the symptom report form, reviewed and scanned in.    Objective:  Physical Exam:  Visit Vitals   ??? BP 110/67   ??? Pulse 84   ??? Temp 98 ??F (36.7 ??C) (Temporal)   ??? Resp 18   ??? Ht 5' 4.96" (1.65 m)   ??? Wt 146 lb (66.2 kg)   ??? LMP 04/13/2010   ??? SpO2 100%   ??? BMI 24.33 kg/m2       General:  Alert, cooperative, no distress, appears stated age.   Head:  Normocephalic, without obvious abnormality, atraumatic.   Eyes:  Conjunctivae/corneas clear. PERRL, EOMs intact.   Throat: Lips, mucosa, and tongue normal.    Neck: Supple, symmetrical, trachea midline, no adenopathy, thyroid: no enlargement/tenderness/nodules   Back:   Symmetric, no curvature. ROM normal. No CVA tenderness.   Lungs:   Clear to auscultation bilaterally.   Chest wall:  No tenderness or deformity.   Heart:  Regular rate and  rhythm, S1, S2 normal, no murmur, click, rub or gallop.   Breast Exam:  S/p R lumpectomy.    Abdomen:   Soft, non-tender. Bowel sounds normal. No masses,  No organomegaly.   Extremities: Extremities normal, atraumatic, no cyanosis or edema.   Skin: Macular rash on elbows, back, abdomen lower, upper thighs, back of legs   Lymph nodes: Cervical, supraclavicular, and axillary nodes normal.   Neurologic: CNII-XII intact.      Diagnostic Imaging     12/16/14 TTE: EF 71%.  03/02/15 TTE: EF 68%  05/30/15 TTE :  EF 65%    Lab Results  Lab Results   Component Value Date/Time    WBC 6.2 06/21/2015 10:41 AM    HGB 11.5 06/21/2015 10:41 AM    HCT 34.8 06/21/2015 10:41 AM    PLATELET 172 06/21/2015 10:41 AM    MCV 95 06/21/2015 10:41 AM       Lab Results   Component Value Date/Time    SODIUM 144 06/21/2015 10:41 AM    POTASSIUM 4.1 06/21/2015 10:41 AM    CHLORIDE 102 06/21/2015 10:41 AM    CO2 25 06/21/2015 10:41 AM    ANION GAP 7 02/22/2015 09:56 AM    GLUCOSE 84 06/21/2015 10:41 AM    BUN 24 06/21/2015 10:41 AM    CREATININE 0.71 06/21/2015 10:41 AM    BUN/CREATININE RATIO 34 06/21/2015 10:41 AM    GFR EST AA 96 06/21/2015 10:41 AM    GFR EST NON-AA 84 06/21/2015 10:41 AM    CALCIUM 8.9 06/21/2015 10:41 AM    ALT 23 06/21/2015 10:41 AM    AST 22 06/21/2015 10:41 AM    ALK. PHOSPHATASE 78 06/21/2015 10:41 AM    PROTEIN, TOTAL 6.6 06/21/2015 10:41 AM    ALBUMIN 4.5 06/21/2015 10:41 AM    GLOBULIN 2.5 04/12/2015 09:23 AM    A-G RATIO 2.1 06/21/2015 10:41 AM       .    Assessment/Plan:  74 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  PS 0    1. Right Breast cancer stage: IA    Hormonal therapy: not indicated due to receptor (-) status    We explained to the patient that the goal of systemic adjuvant therapy is to improve the chances for cure and decrease the risk of relapse. We explained why a patient can have microscopic cancer spread now even though physical examination, laboratory studies and imaging studies are negative for cancer. We explained that the same treatments used now as adjuvant or preventive treatments rarely if ever are curative in women who develop metastases.     She is in excellent health and thus an adjuvant discussion is warranted.  trastuzumab based therapy corresponds to a 9% overall survival benefit,  which is rather larger overall (that corresponds to a nearly 20% DFS benefit).     Rationale for therapy with trastuzumab was also discussed with the patient including a 50% proportional improvement in disease free survival and also an improvement in overall survival in patients receiving trastuzumab and chemotherapy for HER-2 positive breast cancer.?? The side effects of trastuzumab were discussed including a 4%-5% risk of dropping her ejection fraction while on treatment and about a 1% risk of CHF.?? We discussed that this drug will be used every 3 weeks for remainder of a year following the chemotherapy cycles.?? We will check her EF before chemotherapy and every 3 months  while she is receiving trastuzumab.    She is not a candidate to receive pertuzumab with her stage I disease.    Next TTE due Dec, ordered today    Next mammogram due March 2017.     herceptin #4 today, will see her back in 6 weeks for #6    2. Emotional well being:  She has excellent support and is coping well with her disease.    3. FH of breast cancer:  BRCA 1/2 Ambry testing negative    4. Rash: improved;  saw Dr. Ronney Lion, dermatology.      5. Osteoporosis:  On dexa from 2015; on fosamax from Dr. Wanda Plump, has been stopped due to rash      Thank you for this consult.  All of the patient's questions were answered today.    > 15 min were spent with this patient with > 50% of that time spent in face to face counseling      Follow-up Disposition:  Return in 6 weeks (on 08/16/2015).    Sonda Rumble, MD

## 2015-07-05 NOTE — Progress Notes (Signed)
Jillian Bean is a 74 y.o. female here for follow-up of breast cancer.

## 2015-07-05 NOTE — Patient Instructions (Signed)
Echo before next herceptin

## 2015-07-05 NOTE — Progress Notes (Signed)
Oncology Navigator  Psychosocial Assessment    Reason for Assessment:    Depression  Anxiety  Caregiver Burden  Maladaptive Coping with Serious Illness   Other: ongoing support    Sources of Information:    Patient  Family  Staff  Medical Record    Advance Care Planning:  Pt has medical directive on file. This was reviewed with pt today. No changes to be made at this time.     Mental Status:    Alert  Lethargic  Unresponsive  Oriented to:  Person  Place  Time  Situation      Barriers to Learning:    Language  Developmental  Cognitive  Altered Mental Status  Visual/Hearing Impairment  Unable to Read/Write  Motivational   No Barriers Identified  Other:    Relationship Status:  Single  Married  Significant Other/Life Partner  Divorced  Separated  Widowed      Living Circumstances:  Lives Alone  Family/Significant Other in [place] in the Home  Paid Caregivers  Assisted Living Facility/Group [place]  Homeless  Incarcerated  Environmental/Care Concerns  Other:    Support System:    Strong  Fair  Limited    Financial/Legal Concerns:    Uninsured  Limited Income/Resources  Non-Citizen  No Concerns Identified  Financial POA:    Other:    Religious/Spiritual/Existential:  Strong Sense of Spirituality  Involved in [place] Visit  Expressing Spiritual/Existential Angst  No Concerns Identified    Coping with Illness:         Patient: Family/Caregiver:   Understanding and Acceptance of Illness/Prognosis      Strong Sense of Resilience     Self Reflection     Engaged Support System     Does not Readily Discuss Illness     Denial of Terminal Status     Anger     Depression     Anxiety/Fear     Bargaining     Recent Diagnosis/Prognosis     Difficulties with Body Image     Loss of Identity     Excessive Substance Use     Mental Health History     Enmeshed Relationships     History of Loss     Anticipatory Grief     Concern for Complicated Grief      Suicidal Ideation or Plan     Unable to assess                      Narrative:  Met with pt to provide ongoing support and to review medical directives. Pt shared she continues to actively cope with her diagnosis and treatment needs. She reports family remains supportive and engaged in her care as needed. Barriers to care reviewed and none were identified at this visit. Resources provided at pt request. Encouraged pt to call with any questions, concerns.     Referrals:     I.  Transportation    Medicaid Web designer)    ACS Road to [employer]  Financial Assistance/Medication Access    Patient assistance program     Co-pay assistance                                      Leukemia & Lymphoma Society    Patient [place]    CancerCare       Emotional support    Peer support group    Local counseling                                     Online support group    Coordination of psychiatry consult      Goals/Plan:   1. ACP discussion completed  2. Ongoing support

## 2015-07-18 NOTE — Telephone Encounter (Signed)
Called the patient and verified ID x 2.  Informed the patient that per Abel Presto, NP that the ECHO needs to be re-scheduled to be completed before 07/26/15.  The patient stated that she is available 07/21/15, 07/22/15, or 07/25/15 anytime.  Informed the patient that an appointment will be scheduled and the patient will be informed of the date and time.  The patient verbalized understanding and denied any further questions or concerns.

## 2015-07-18 NOTE — Telephone Encounter (Signed)
Patient called stating she has her Echo scheduled for 08/23/15 however she was told by Dr. Laurena Bering to have the echo in December. She would like to know if she should reschedule this or keep it. Call back number [phone]

## 2015-07-19 ENCOUNTER — Inpatient Hospital Stay: Admit: 2015-07-20 | Payer: MEDICARE | Primary: Internal Medicine

## 2015-07-19 ENCOUNTER — Ambulatory Visit: Admit: 2015-07-19 | Discharge: 2015-07-19 | Payer: MEDICARE | Attending: Internal Medicine | Primary: Internal Medicine

## 2015-07-19 DIAGNOSIS — Z0000 Encounter for general adult medical examination without abnormal findings: Secondary | ICD-10-CM

## 2015-07-19 DIAGNOSIS — Z9189 Other specified personal risk factors, not elsewhere classified: Secondary | ICD-10-CM

## 2015-07-19 NOTE — Progress Notes (Signed)
Nurse Navigator Medicare Wellness Visit performed by D. Lenard Galloway, RN    This is an Initial [employer] Exam (AWV) (Performed 12 months after IPPE or effective date of Medicare Part B enrollment, Once in a lifetime)    I have reviewed the patient's medical history in detail and updated the computerized patient record.     History     Past Medical History   Diagnosis Date   ??? Back pain 09/01/2007   ??? Cancer (HCC)      R breast   ??? Coagulation disorder ([place])      IRON DEFFICIENCY ANEMIA   ??? Colonic polyps 08/31/1998   ??? 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 Hiatal hernia 07/11/2011     Dr.sobieski   ??? Hypercholesteremia 09/01/2007   ??? Hypothyroidism 09/01/2003   ??? 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 Nausea & vomiting    ??? OA (osteoarthritis) 09/01/2007   ??? Other ill-defined conditions(799.89)      VERTIGO   ??? Plantar fasciitis 09/01/2007   ??? S/P colonoscopy 10/10/2007   ??? Thyroid disease       Past Surgical History   Procedure Laterality Date   ??? Hx gyn       Hysterectomy in her 63's   ??? Pr breast surgery procedure unlisted  1993     Breast Reduction   ??? Endoscopy, colon, diagnostic  7/12/18/08     7/05 Dr Maudry Diego   ??? Hx other surgical       MINIMALLY INVASIVE LUMBAR DECOMPRESSION   ??? Hx gi       COLONOSCOPY   ??? Hx heent  04/07/10     thyroid biopsy neg dr Leward Quan   ??? Hx heent       wisdom teeth extraction   ??? Hx orthopaedic  08/22/09     left TKR   ??? Hx orthopaedic  08/13/12     right tkr     Current Outpatient Prescriptions   Medication Sig Dispense Refill   ??? meloxicam (MOBIC) 7.5 mg tablet TAKE 1 TABLET BY MOUTH DAILY 90 Tab 0   ??? docusate sodium (COLACE) 100 mg capsule Take 100 mg by mouth daily.     ??? 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 latanoprost (XALATAN) 0.005 % ophthalmic solution Administer  to both eyes nightly.  6    ??? lidocaine-prilocaine (EMLA) topical cream Apply  to affected area as needed for Pain. 30 g 0   ??? colesevelam (WELCHOL) 625 mg tablet 1 po bid 180 Tab 2   ??? levothyroxine (SYNTHROID) 112 mcg tablet 8 per week 100 Tab 3   ??? aspirin 81 mg chewable tablet Take 81 mg by mouth daily.     ??? multivitamin (ONE A DAY) tablet Take 1 Tab by mouth daily.     ??? cholecalciferol, vitamin d3, (VITAMIN D) 1,000 unit tablet Take  by mouth daily.       Allergies   Allergen Reactions   ??? Zocor [Simvastatin] Other (comments) and Diarrhea     Other reaction(s): Adverse reaction to substance   Myalgias     ??? Celebrex [Celecoxib] Other (comments)     Aggitation   ??? Gabapentin Other (comments)     FELT UNSTEADY ON MY FEET   ??? Levaquin [Levofloxacin] Palpitations   ??? Nexium [Esomeprazole Magnesium] Diarrhea   ???  Oxycontin [Oxycodone] Itching   ??? Pravastatin Nausea and Vomiting   ??? Yellow Dye Hives     Family History   Problem Relation Age of Onset   ??? Cancer Mother      Breast   ??? Heart Failure Father      MI   ??? Seizures Brother      ms     Social History   Substance Use Topics   ??? Smoking status: Never Smoker   ??? Smokeless tobacco: Never Used   ??? Alcohol use No     Patient Active Problem List   Diagnosis Code   ??? Hypercholesteremia E78.00   ??? OA (osteoarthritis) M19.90   ??? Back pain M54.9   ??? Plantar fasciitis M72.2   ??? Colonic polyps    ??? S/P colonoscopy Z98.890   ??? DJD (degenerative joint disease) of knee M17.9   ??? DJD (degenerative joint disease), cervical M50.30   ??? 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 Iron deficiency anemia D50.9   ??? Right knee DJD M17.9   ??? Unspecified hypothyroidism E03.9   ??? Malignant neoplasm of right female breast ([place]) C50.911   ??? Advanced care planning/counseling discussion Z71.89         Depression Risk Factor Screening:   Patient denies feelings of being down, depressed or hopeless at this time. Patient shares that she is undergoing treatment for breast cancer  (followed by oncology) & she has an exceptional support system within her family & friends who have helped keep her spirits up through this process.   PHQ 2 / 9, over the last two weeks 07/19/2015   Little interest or pleasure in doing things Not at all   Feeling down, depressed or hopeless Not at all   Total Score PHQ 2 0     Alcohol Risk Factor Screening:   On any occasion during the past 3 months, have you had more than 3 drinks containing alcohol?  No    Do you average more than 7 drinks per week?  No    Functional Ability and Level of Safety:     Hearing Loss   normal-to-mild    Activities of Daily Living   Self-care. Patient states that she lives with her husband in a private residence. Patient states independence in all ADLs & denies the use of assistive devices for ambulation.  NN encouraged patient to continue and/ or introduce routine physical exercise into their daily routine as applicable & as recommended by PCP. Patient verbalized understanding & agreement to take this into consideration.    Requires assistance with:   ADL Assessment 07/19/2015   Feeding yourself No Help Needed   Getting from bed to chair No Help Needed   Getting dressed No Help Needed   Bathing or showering No Help Needed   Walk across the room (includes cane/walker) No Help Needed   Using the telphone No Help Needed   Taking your medications No Help Needed   Preparing meals No Help Needed   Managing money (expenses/bills) No Help Needed   Moderately strenuous housework (laundry) No Help Needed   Shopping for personal items (toiletries/medicines) No Help Needed   Shopping for groceries No Help Needed   Driving No Help Needed   Climbing a flight of stairs No Help Needed   Getting to places beyond walking distances No Help Needed       Fall Risk   Patient denies falls within the past year & verbalizes awareness of fall  prevention strategies.   Fall Risk Assessment, last 12 mths 07/19/2015   Able to walk? Yes   Fall in past 12 months? No      Abuse Screen   Patient is not abused    Review of Systems   Medicare Wellness Visit    Physical Examination     No exam data present    Evaluation of Cognitive Function:  Mood/affect:  Happy & very pleasant  Appearance: age appropriate and casually dressed  Family member/caregiver input: None present; however, patient reports a strong support system.     No exam performed today, Medicare Wellness Visit.    Patient Care Team:  [name], MD as PCP - General  Telford Nab, MD (Cardiology)    Advice/Referrals/Counseling   Education and counseling provided:  End-of-Life planning (with patient's consent)  Pneumococcal Vaccine  Influenza Vaccine  Screening Mammography  Screening Pap and pelvic (covered once every 2 years)  Colorectal cancer screening tests  Bone mass measurement (DEXA)  Screening for glaucoma  tdap & shingles vaccinations    Assessment/Plan   1. A copy of patient's AMD is on file. NN reviewed document with patient & patient denies changes to the document.     2. Patient is up to date on the following immunizations: flu vaccine (admin 06/2015), tdap vaccine (admin 02/2014), shingles vaccine (admin 05/2013) & pneumonia 23 vaccine (admin 11/2005). Patient confirmed the aforementioned preventative immunization dates are correct. Patient is due for Prevnar 13.  Patient is aware that Prevnar 13 can be given at their local pharmacy with a prescription from their PCP. Patient verbalized understanding. PCP notified. Patient's health maintenance immunization record has been updated & is current.     3. Patient states that she follows the PCP's recommendations for the following screenings: Mammography (repor on file from 2016), pap/ pelvic, DEXA scan (report on file from 02/2014) & colonoscopy. Patient reports having a screening colonoscopy in 2013 performed by either Dr. Adron Bene or Dr. Truman Hayward [facility] Patient is unsure of the follow-up screening  recommendations. [facility] faxed requesting a copy of patient's last colonoscopy screening report with patient's verbal approval. Patient confirmed the aforementioned preventative screening dates.     4. Patient wears corrective lenses. Patient reports having a routine eye exam within the last year performed by Dr. Lazarus Gowda at the [facility]. Patient shares that she was recently diagnosed with glaucoma, so she is evaluated by Dr. Lazarus Gowda every 3 months. [facility] faxed requesting a copy of patient's last eye exam with glaucoma screening with patient's verbal approval.     Patient verbalized understanding of all information discussed. Patient was given the opportunity to ask questions.  Medication reconciliation completed by MA/ LPN and reviewed by PCP.  Patient provided AVS which includes Medicare Wellness Preventative Screening Table.

## 2015-07-19 NOTE — Progress Notes (Signed)
Message sent about labs

## 2015-07-19 NOTE — Patient Instructions (Signed)
Medicare Part B Preventive Services Guidelines/Limitations Date last completed and Frequency Due Date   Bone Mass Measurement  (age 75 & older, biennial) Requires diagnosis related to osteoporosis or estrogen deficiency. Biennial benefit unless patient has history of long-term glucocorticoid tx or baseline is needed because initial test was by other method Completed 02/2014    Recommended every 2 years As recommended by your PCP or Specialist     Cardiovascular Screening Blood Tests (every 5 years)  Total cholesterol, HDL, Triglycerides Order as a panel if possible Completed 06/2015    As recommended by your PCP As recommended by your PCP or Specialist   Colorectal Cancer Screening  -Fecal occult blood test (annual)  -Flexible sigmoidoscopy (5y)  -Screening colonoscopy (10y)  -Barium Enema Age 51-80; After age 93 if history of abnormal results Completed 08/2011     Recommended every 5 to 10 years  As recommended by your PCP or Specialist     Counseling to Prevent Tobacco Use (up to 8 sessions per year)  - Counseling greater than 3 and up to 10 minutes  - Counseling greater than 10 minutes Patients must be asymptomatic of tobacco-related conditions to receive as preventive service N/A N/A   Diabetes Screening Tests (at least every 3 years, Medicare covers annually or at 71-month intervals for prediabetic patients)    Fasting blood sugar (FBS) or glucose tolerance test (GTT) Patient must be diagnosed with one of the following:  -Hypertension, Dyslipidemia, obesity, previous impaired FBS or GTT  ???Or any two of the following: overweight, FH of diabetes, age ?53, history of gestational diabetes, birth of baby weighing more than 9 pounds Completed 02/2015    Recommended every 3 years for non-diabetics     As recommended by your PCP or Specialist     Glaucoma Screening (no USPSTF recommendation) Diabetes mellitus, family history, African American, age 63 or over, Hispanic American, age 18 or over Completed 2016     Recommended annually As recommended by your PCP or Specialist   Seasonal Influenza Vaccination (annually)  Completed 06/2015    Recommended Annually Completed for 2016   TDAP Vaccination  Completed 02/2014    Recommended every 10 years Due 02/2024   Zoster (Shingles) Vaccination Covered by Medicare Part D through the pharmacy- PCP provides prescription Completed 05/2013    Recommended once over age 19  Complete   Pneumococcal Vaccination (once after 47)  Pneumo 23- 11/2005  Recommended once over the age of 54    Prevnar 20-  Recommended once over the age of 75 Complete        You are due for a Prevnar 13 vaccine.  You can get this at your local pharmacy with a prescription from your PCP.    Screening Mammography (biennial age 57-74) Annually (age 71 or over) Completed 2016   As recommended by your PCP or Specialist     Screening Pap Tests and Pelvic Examination (up to age 47 and after 69 if unknown history or abnormal study last 70 years) Every 82 months except high risk As recommended by your PCP or Specialist   As recommended by your PCP or Specialist     Ultrasound Screening for Abdominal Aortic Aneurysm (AAA) (once) Patient must be referred through IPPE and not have had a screening for abdominal aortic aneurysm before under Medicare.  Limited to patients who meet one of the following criteria:  - Men who are 53-17 years old and have smoked more than 100 cigarettes in their lifetime.  -  Anyone with a FH of AAA  -Anyone recommended for screening by USPSTF Not indicated unless recommended by PCP   Not indicated unless recommended by PCP     Family Practice Management 2011    If you have any questions or concerns please feel free to contact me at [phone].  It was a pleasure meeting you today and participating in your healthcare.  Terrill Mohr, RN

## 2015-07-19 NOTE — Progress Notes (Signed)
HISTORY OF PRESENT ILLNESS  Jillian Bean is a 74 y.o. female.  HPI  Comes in for Medicare wellness visit with nurse navigator, as well as GYN exam with me.  She's had a hysterectomy in her 28s. Her last Pap smear was in 2012.  She's not having any vaginal symptoms.  She does have a history of breast cancer and is being followed by oncology.  She's doing well.  Her last bone density was July of 2015 and will plan one in July of 2017.  Last colonoscopy was 2013.  Her only concern is a very faint mostly not itchy rash across her mid abdomen. She has seen dermatology, Dr. Ronney Lion, not sure of cause. She is off of the Fosamax now and we have discussed stopping Meloxicam if the rash persists.  She is not having fevers or chills or abdominal symptoms.  No UTI symptoms.      Review of Systems   Constitutional: Negative for chills, fever and weight loss.   Respiratory: Negative for cough, shortness of breath and wheezing.    Cardiovascular: Negative for chest pain, palpitations, orthopnea, leg swelling and PND.   Gastrointestinal: Negative for abdominal pain, diarrhea, heartburn and nausea.   Genitourinary: Negative for dysuria and urgency.   Musculoskeletal: Negative for myalgias.   Skin: Positive for rash. Negative for itching.   Neurological: Negative for dizziness, sensory change and headaches.       Physical Exam   Constitutional: She is oriented to person, place, and time. She appears well-developed and well-nourished.   HENT:   Head: Normocephalic and atraumatic.   Mouth/Throat: Oropharynx is clear and moist.   Neck: Normal range of motion. Neck supple. Carotid bruit is not present. No thyromegaly present.   Cardiovascular: Normal rate, regular rhythm, S1 normal, S2 normal, normal heart sounds and intact distal pulses.    No murmur heard.  Pulmonary/Chest: Effort normal and breath sounds normal. No respiratory distress. She has no wheezes. She has no rales.    Abdominal: Soft. Bowel sounds are normal. There is no tenderness.   Genitourinary: Vagina normal. No vaginal discharge found.   Genitourinary Comments: Vaginal cuff well visualized and wnl  No discharge seen   Musculoskeletal: She exhibits no edema.   Lymphadenopathy:     She has no cervical adenopathy.   Neurological: She is alert and oriented to person, place, and time.   Psychiatric: She has a normal mood and affect. Her behavior is normal.   Nursing note and vitals reviewed.  breast right bilat scars    ASSESSMENT and PLAN  Jillian Bean was seen today for gyn exam and annual wellness visit.    Diagnoses and all orders for this visit:      Medicare annual wellness visit, subsequent  I personally saw and examined the patient. I have reviewed and agree with the Nurse Practitioner's findings including all diagnostic interpretations and plans as written. I was present during the key parts of separately billed procedures.    Hypercholesterolemia  -     colesevelam (WELCHOL) 625 mg tablet; 1 po bid    Hypothyroidism due to acquired atrophy of thyroid  -     levothyroxine (SYNTHROID) 112 mcg tablet; 8 per week    GYN exam for high-risk Medicare patient  -     PAP (IMAGE GUIDED), LIQUID-BASED  dexa in 7/17  Routine general medical examination at a health care facility    Screening for alcoholism    Screening for depression  Advanced care planning/counseling discussion

## 2015-07-19 NOTE — Telephone Encounter (Signed)
Called the patient and verified ID x 2.  Informed the patient that her ECHO has been re-scheduled for this Thursday 07/21/15 at 9:00 am at the [facility] location.  The patient verbalized understanding and denied any further questions or concerns.

## 2015-07-20 ENCOUNTER — Inpatient Hospital Stay: Admit: 2015-07-20 | Primary: Internal Medicine

## 2015-07-20 MED ORDER — COLESEVELAM 625 MG TAB
625 mg | ORAL_TABLET | ORAL | 2 refills | Status: DC
Start: 2015-07-20 — End: 2016-05-04

## 2015-07-20 MED ORDER — LEVOTHYROXINE 112 MCG TAB
112 mcg | ORAL_TABLET | ORAL | 3 refills | Status: DC
Start: 2015-07-20 — End: 2016-02-16

## 2015-07-21 ENCOUNTER — Institutional Professional Consult (permissible substitution): Admit: 2015-07-21 | Discharge: 2015-07-21 | Payer: MEDICARE | Primary: Internal Medicine

## 2015-07-21 DIAGNOSIS — R079 Chest pain, unspecified: Secondary | ICD-10-CM

## 2015-07-21 MED ORDER — SODIUM CHLORIDE 0.9 % IV
440 mg | Freq: Once | INTRAVENOUS | Status: AC
Start: 2015-07-21 — End: 2015-07-26
  Administered 2015-07-26: 15:00:00 via INTRAVENOUS

## 2015-07-21 MED ORDER — SODIUM CHLORIDE 0.9 % IV
INTRAVENOUS | Status: AC
Start: 2015-07-21 — End: 2015-07-26
  Administered 2015-07-26: 15:00:00 via INTRAVENOUS

## 2015-07-21 MED FILL — SODIUM CHLORIDE 0.9 % IV: INTRAVENOUS | Qty: 1000

## 2015-07-26 ENCOUNTER — Inpatient Hospital Stay: Admit: 2015-07-26 | Payer: MEDICARE | Primary: Internal Medicine

## 2015-07-26 DIAGNOSIS — C50911 Malignant neoplasm of unspecified site of right female breast: Secondary | ICD-10-CM

## 2015-07-26 MED ORDER — SODIUM CHLORIDE 0.9 % INJECTION
INTRAMUSCULAR | Status: AC | PRN
Start: 2015-07-26 — End: 2015-07-27
  Administered 2015-07-26: 14:00:00 via INTRAVENOUS

## 2015-07-26 MED ORDER — HEPARIN, PORCINE (PF) 100 UNIT/ML IV SYRINGE
100 unit/mL | INTRAVENOUS | Status: AC | PRN
Start: 2015-07-26 — End: 2015-07-27
  Administered 2015-07-26: 16:00:00 via INTRAVENOUS

## 2015-07-26 MED ORDER — SODIUM CHLORIDE 0.9 % IJ SYRG
INTRAMUSCULAR | Status: AC | PRN
Start: 2015-07-26 — End: 2015-07-27
  Administered 2015-07-26: 16:00:00 via INTRAVENOUS

## 2015-07-26 MED FILL — HERCEPTIN 440 MG INTRAVENOUS SOLUTION: 440 mg | INTRAVENOUS | Qty: 405

## 2015-07-26 NOTE — Progress Notes (Signed)
[facility] VISIT NOTE    0900  Pt arrived at [facility] ambulatory and in no distress for C5 of Herceptin maintainence.  Assessment completed, pt notes continued but improved rash to abdomen. Pt requested Herceptin to be administered more slowly today because last time she experienced some restlessness and chills during and following infusion.     Blood pressure 126/44, pulse 88, temperature 97.3 ??F (36.3 ??C), resp. rate 18, height 5\' 5"  (1.651 m), weight 66.4 kg (146 lb 6.4 oz), last menstrual period 04/13/2010, not currently breastfeeding.    Left chest port accessed with 0.75 in huber with no difficulty. Positive blood return noted.     Medications received:  NS IV  Herceptin IV (over 45 minutes)    Tolerated treatment well, no adverse reaction noted. Port de-accessed and flushed per protocol. Positive blood return noted post treatment.     Blood pressure 131/64, pulse 78, temperature 97 ??F (36.1 ??C), resp. rate 16, height 5\' 5"  (1.651 m), weight 66.4 kg (146 lb 6.4 oz), last menstrual period 04/13/2010, not currently breastfeeding.      1115  D/C'd from [facility] ambulatory and in no distress. Next appointment is 08/15/14 at 0900.

## 2015-07-26 NOTE — Progress Notes (Signed)
Problem: Chemotherapy Treatment  Goal: *Chemotherapy regimen followed  Outcome: Progressing Towards Goal  Patient to receive C5 of Herceptin today as ordered.

## 2015-08-09 MED ORDER — OVERFILL VOLUME
440 mg | Freq: Once | INTRAVENOUS | Status: AC
Start: 2015-08-09 — End: 2015-08-16
  Administered 2015-08-16: 16:00:00 via INTRAVENOUS

## 2015-08-09 MED ORDER — SODIUM CHLORIDE 0.9 % IV
INTRAVENOUS | Status: AC
Start: 2015-08-09 — End: 2015-08-16
  Administered 2015-08-16: 16:00:00 via INTRAVENOUS

## 2015-08-09 MED FILL — SODIUM CHLORIDE 0.9 % IV: INTRAVENOUS | Qty: 1000

## 2015-08-16 ENCOUNTER — Ambulatory Visit: Admit: 2015-08-16 | Discharge: 2015-08-16 | Payer: MEDICARE | Attending: Nurse Practitioner | Primary: Internal Medicine

## 2015-08-16 ENCOUNTER — Inpatient Hospital Stay: Admit: 2015-08-16 | Payer: MEDICARE | Primary: Internal Medicine

## 2015-08-16 DIAGNOSIS — Z5111 Encounter for antineoplastic chemotherapy: Secondary | ICD-10-CM

## 2015-08-16 DIAGNOSIS — C50911 Malignant neoplasm of unspecified site of right female breast: Secondary | ICD-10-CM

## 2015-08-16 MED ORDER — HEPARIN, PORCINE (PF) 100 UNIT/ML IV SYRINGE
100 unit/mL | INTRAVENOUS | Status: AC | PRN
Start: 2015-08-16 — End: 2015-08-17
  Administered 2015-08-16: 17:00:00 via INTRAVENOUS

## 2015-08-16 MED ORDER — SODIUM CHLORIDE 0.9 % IJ SYRG
INTRAMUSCULAR | Status: AC | PRN
Start: 2015-08-16 — End: 2015-08-17
  Administered 2015-08-16 (×2): via INTRAVENOUS

## 2015-08-16 MED ORDER — SODIUM CHLORIDE 0.9 % INJECTION
INTRAMUSCULAR | Status: AC | PRN
Start: 2015-08-16 — End: 2015-08-17
  Administered 2015-08-16: 14:00:00 via INTRAVENOUS

## 2015-08-16 MED FILL — HERCEPTIN 440 MG INTRAVENOUS SOLUTION: 440 mg | INTRAVENOUS | Qty: 405

## 2015-08-16 NOTE — Progress Notes (Signed)
[facility] Nursing Progress Note    0920    Patient arrived to [place] ambulatory accompanied by self for scheduled Herceptin  Cycle PT denies complaints.      L port  Accessed with  0.75 in. huber needle, labs drawn, port flushed, positive blood return noted.       0925  PT sent to MD .    1110 PT returned from [facility] clinic, no changes in current orders and labs met parameters for treatment.     labs No results found for this or any previous visit (from the past 12 hour(s)).          Patient received infusion without difficulty.     Medications this visit:    NS IV  Herceptin IVPB (over 45 mins at PT request)    Vitals   Patient Vitals for the past 12 hrs:   Temp Pulse Resp BP SpO2   08/16/15 0924 97.3 ??F (36.3 ??C) 82 16 111/60 98 %         PT declined post transfusion observation.    Next appointment,  09/06/15 @ 0900    1220 D/C from [facility]  ambulatory in no distress, accompanied by self.

## 2015-08-16 NOTE — Progress Notes (Signed)
[facility]  [place], [place]   [address]  W: [phone]   F: [phone]      F/u HEME/ONC CONSULT    Reason for visit:  evaluation for treatment for breast cancer    Consulting physician:  Dr. Gilford Rile    HPI:   Jillian Bean is a 75 y.o.  female who I was asked to see in consultation at the request of Dr. Elie Confer for evaluation for systemic therapy for breast cancer.    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  Right lumpectomy on 11/10/14 shows IDC, 1.7 cm, gr 3, 0/5 LN, no LVI.  PT1cN0Mx.    TCH q 3 weeks x 6: 12/21/14- 04/12/15  C#5 held 1 week on 03/15/15 due to thrombocytopenia   Outback Herceptin: 05/03/15-    S/p XRT 06/03/15    Interval History: Complains of  gr 1 rash.    DX   Encounter Diagnosis   Name Primary?   ??? Malignant neoplasm of right female breast, unspecified site of breast ([place]) Yes       Past Medical History   Diagnosis Date   ??? Back pain 09/01/2007   ??? Cancer ([place])      R breast   ??? Coagulation disorder ([place])      IRON DEFFICIENCY ANEMIA   ??? Colonic polyps 08/31/1998   ??? 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 Hiatal hernia 07/11/2011     Dr.sobieski   ??? Hypercholesteremia 09/01/2007   ??? Hypothyroidism 09/01/2003   ??? 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 Nausea & vomiting    ??? OA (osteoarthritis) 09/01/2007   ??? Other ill-defined conditions(799.89)      VERTIGO   ??? Plantar fasciitis 09/01/2007   ??? S/P colonoscopy 10/10/2007   ??? Thyroid disease      Past Surgical History   Procedure Laterality Date   ??? Hx gyn       Hysterectomy in her 7's   ??? Pr breast surgery procedure unlisted  1993     Breast Reduction   ??? Endoscopy, colon, diagnostic  7/12/18/08     7/05 Dr Maudry Diego   ??? Hx other surgical        MINIMALLY INVASIVE LUMBAR DECOMPRESSION   ??? Hx gi       COLONOSCOPY   ??? Hx heent  04/07/10     thyroid biopsy neg dr Leward Quan   ??? Hx heent       wisdom teeth extraction   ??? Hx orthopaedic  08/22/09     left TKR   ??? Hx orthopaedic  08/13/12     right tkr     Social History     Social History   ??? Marital status: MARRIED     Spouse name: N/A   ??? Number of children: N/A   ??? Years of education: N/A     Social History Main Topics   ??? Smoking status: Never Smoker   ??? Smokeless tobacco: Never Used   ??? Alcohol use No   ??? Drug use: No   ??? Sexual activity: Not Currently     Other Topics Concern   ??? None  Social History Narrative     Family History   Problem Relation Age of Onset   ??? Cancer Mother      Breast   ??? Heart Failure Father      MI   ??? Seizures Brother      ms       Current Outpatient Prescriptions   Medication Sig Dispense Refill   ??? colesevelam (WELCHOL) 625 mg tablet 1 po bid 180 Tab 2   ??? levothyroxine (SYNTHROID) 112 mcg tablet 8 per week 100 Tab 3   ??? docusate sodium (COLACE) 100 mg capsule Take 100 mg by mouth daily.     ??? 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 latanoprost (XALATAN) 0.005 % ophthalmic solution Administer  to both eyes nightly.  6   ??? aspirin 81 mg chewable tablet Take 81 mg by mouth daily.     ??? multivitamin (ONE A DAY) tablet Take 1 Tab by mouth daily.     ??? cholecalciferol, vitamin d3, (VITAMIN D) 1,000 unit tablet Take  by mouth daily.     ??? meloxicam (MOBIC) 7.5 mg tablet TAKE 1 TABLET BY MOUTH DAILY 90 Tab 0   ??? lidocaine-prilocaine (EMLA) topical cream Apply  to affected area as needed for Pain. 30 g 0     Facility-Administered Medications Ordered in Other Visits   Medication Dose Route Frequency Provider Last Rate Last Dose   ??? sodium chloride 0.9 % injection 10 mL  10 mL IntraVENous PRN Annie Paras, MD   10 mL at 08/16/15 0927   ??? heparin (porcine) pf 500 Units  500 Units IntraVENous PRN Annie Paras, MD        ??? sodium chloride (NS) flush 10-40 mL  10-40 mL IntraVENous PRN Annie Paras, MD   10 mL at 08/16/15 2992   ??? 0.9% sodium chloride infusion  25 mL/hr IntraVENous CONTINUOUS Joyce Gross, MD       ??? trastuzumab (HERCEPTIN) 405 mg in 0.9% sodium chloride 250 mL, overfill volume 25 mL IVPB  405 mg IntraVENous ONCE Joyce Gross, MD           Allergies   Allergen Reactions   ??? Zocor [Simvastatin] Other (comments) and Diarrhea     Other reaction(s): Adverse reaction to substance   Myalgias     ??? Celebrex [Celecoxib] Other (comments)     Aggitation   ??? Gabapentin Other (comments)     FELT UNSTEADY ON MY FEET   ??? Levaquin [Levofloxacin] Palpitations   ??? Nexium [Esomeprazole Magnesium] Diarrhea   ??? Oxycontin [Oxycodone] Itching   ??? Pravastatin Nausea and Vomiting   ??? Yellow Dye Hives       Review of Systems    A comprehensive review of systems was performed and all systems were negative except for HPI and for the symptom report form, reviewed and scanned in.    Objective:  Physical Exam:  Visit Vitals   ??? BP 111/60   ??? Pulse 82   ??? Temp 97.3 ??F (36.3 ??C) (Temporal)   ??? Resp 16   ??? Ht 5' 2.21" (1.58 m)   ??? Wt 148 lb 6.4 oz (67.3 kg)   ??? LMP 04/13/2010   ??? SpO2 98%   ??? BMI 26.96 kg/m2       General:  Alert, cooperative, no distress, appears stated age.   Head:  Normocephalic, without obvious abnormality, atraumatic.   Eyes:  Conjunctivae/corneas  clear. PERRL, EOMs intact.   Throat: Lips, mucosa, and tongue normal.    Neck: Supple, symmetrical, trachea midline, no adenopathy, thyroid: no enlargement/tenderness/nodules   Back:   Symmetric, no curvature. ROM normal. No CVA tenderness.   Lungs:   Clear to auscultation bilaterally.   Chest wall:  No tenderness or deformity.   Heart:  Regular rate and rhythm, S1, S2 normal, no murmur, click, rub or gallop.   Breast Exam:  S/p R lumpectomy.    Abdomen:   Soft, non-tender. Bowel sounds normal. No masses,  No organomegaly.    Extremities: Extremities normal, atraumatic, no cyanosis or edema.   Skin: Improved macular rash on elbows, back, abdomen lower, upper thighs, back of legs   Lymph nodes: Cervical, supraclavicular, and axillary nodes normal.   Neurologic: CNII-XII intact.     Diagnostic Imaging     12/16/14 TTE: EF 71%.  03/02/15 TTE: EF 68%  05/30/15 TTE :  EF 65%  07/21/15 TTE: EF 66%    Lab Results  Lab Results   Component Value Date/Time    WBC 6.2 06/21/2015 10:41 AM    HGB 11.5 06/21/2015 10:41 AM    HCT 34.8 06/21/2015 10:41 AM    PLATELET 172 06/21/2015 10:41 AM    MCV 95 06/21/2015 10:41 AM       Lab Results   Component Value Date/Time    Sodium 144 06/21/2015 10:41 AM    Potassium 4.1 06/21/2015 10:41 AM    Chloride 102 06/21/2015 10:41 AM    CO2 25 06/21/2015 10:41 AM    Anion gap 7 02/22/2015 09:56 AM    Glucose 84 06/21/2015 10:41 AM    BUN 24 06/21/2015 10:41 AM    Creatinine 0.71 06/21/2015 10:41 AM    BUN/Creatinine ratio 34 06/21/2015 10:41 AM    GFR est AA 96 06/21/2015 10:41 AM    GFR est non-AA 84 06/21/2015 10:41 AM    Calcium 8.9 06/21/2015 10:41 AM    ALT 23 06/21/2015 10:41 AM    AST 22 06/21/2015 10:41 AM    Alk. phosphatase 78 06/21/2015 10:41 AM    Protein, total 6.6 06/21/2015 10:41 AM    Albumin 4.5 06/21/2015 10:41 AM    Globulin 2.5 04/12/2015 09:23 AM    A-G Ratio 2.1 06/21/2015 10:41 AM     Assessment/Plan:  75 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  PS 0    1. Right Breast cancer stage: IA    Hormonal therapy: not indicated due to receptor (-) status    We explained to the patient that the goal of systemic adjuvant therapy is to improve the chances for cure and decrease the risk of relapse. We explained why a patient can have microscopic cancer spread now even though physical examination, laboratory studies and imaging studies are negative for cancer. We explained that the same treatments used now as adjuvant or  preventive treatments rarely if ever are curative in women who develop metastases.     She is in excellent health and thus an adjuvant discussion is warranted.  trastuzumab based therapy corresponds to a 9% overall survival benefit, which is rather larger overall (that corresponds to a nearly 20% DFS benefit).     Rationale for therapy with trastuzumab was also discussed with the patient including a 50% proportional improvement in disease free survival and also an improvement in overall survival in patients receiving trastuzumab and  chemotherapy for HER-2 positive breast cancer.?? The side effects of trastuzumab were discussed including a 4%-5% risk of dropping her ejection fraction while on treatment and about a 1% risk of CHF.?? We discussed that this drug will be used every 3 weeks for remainder of a year following the chemotherapy cycles.?? We will check her EF before chemotherapy and every 3 months while she is receiving trastuzumab.    She is not a candidate to receive pertuzumab with her stage I disease.    Last TTE 07/21/15, stable. Repeat due before 10/19/14.     Next mammogram due March 2017, scheduled 10/26/14.      herceptin #6 today, will see her back in 6 weeks for #8.    2. Emotional well being: She has excellent support and is coping well with her disease.    3. FH of breast cancer: BRCA 1/2 [place] testing negative    4. Rash: improved; saw Dr. Ronney Lion, dermatology.      5. Osteoporosis:  On dexa from 2015; on fosamax from Dr. Wanda Plump, has been stopped due to rash    Thank you for this consult.  All of the patient's questions were answered today.    > 15 min were spent with this patient with > 50% of that time spent in face to face counseling      Follow-up Disposition:  Return in about 6 weeks (around 09/27/2015) for fu, sand/Aquila Menzie, opic herceptin 8.    Sonda Rumble, MD

## 2015-08-16 NOTE — Patient Instructions (Signed)
Come see us in 6 weeks.

## 2015-08-16 NOTE — Progress Notes (Signed)
Jillian Bean is a 74 y.o. female here for follow-up of breast cancer.

## 2015-08-16 NOTE — Progress Notes (Signed)
Problem: Chemotherapy Treatment  Goal: *Chemotherapy regimen followed  Outcome: Progressing Towards Goal  Pt arrived to [facility] ambulatory for scheduled Herceptin C6, PT tolerated TX well.

## 2015-08-23 ENCOUNTER — Encounter: Primary: Internal Medicine

## 2015-08-24 MED ORDER — DICLOFENAC 50 MG TAB, DELAYED RELEASE
50 mg | ORAL_TABLET | Freq: Two times a day (BID) | ORAL | 2 refills | Status: DC
Start: 2015-08-24 — End: 2015-10-31

## 2015-09-01 MED ORDER — SODIUM CHLORIDE 0.9 % IV
INTRAVENOUS | Status: AC
Start: 2015-09-01 — End: 2015-09-06
  Administered 2015-09-06: 15:00:00 via INTRAVENOUS

## 2015-09-01 MED ORDER — TRASTUZUMAB 440 MG IV SOLR
440 mg | Freq: Once | INTRAVENOUS | Status: AC
Start: 2015-09-01 — End: 2015-09-06
  Administered 2015-09-06: 15:00:00 via INTRAVENOUS

## 2015-09-01 MED FILL — SODIUM CHLORIDE 0.9 % IV: INTRAVENOUS | Qty: 1000

## 2015-09-06 ENCOUNTER — Inpatient Hospital Stay: Admit: 2015-09-06 | Payer: MEDICARE | Primary: Internal Medicine

## 2015-09-06 MED ORDER — HEPARIN, PORCINE (PF) 100 UNIT/ML IV SYRINGE
100 unit/mL | INTRAVENOUS | Status: AC | PRN
Start: 2015-09-06 — End: 2015-09-07
  Administered 2015-09-06: 16:00:00 via INTRAVENOUS

## 2015-09-06 MED ORDER — SODIUM CHLORIDE 0.9 % IJ SYRG
INTRAMUSCULAR | Status: AC | PRN
Start: 2015-09-06 — End: 2015-09-07
  Administered 2015-09-06: 16:00:00 via INTRAVENOUS

## 2015-09-06 MED ORDER — SODIUM CHLORIDE 0.9 % INJECTION
INTRAMUSCULAR | Status: AC | PRN
Start: 2015-09-06 — End: 2015-09-07
  Administered 2015-09-06: 14:00:00 via INTRAVENOUS

## 2015-09-06 MED FILL — HERCEPTIN 440 MG INTRAVENOUS SOLUTION: 440 mg | INTRAVENOUS | Qty: 405

## 2015-09-06 NOTE — Progress Notes (Signed)
[facility] VISIT NOTE    [id number]  Pt arrived at [facility] ambulatory and in no distress for C7 of Herceptin.  Assessment completed, pt c/o increased joint pain to hands and back-started taking diclofenac and notes improvement to joint pain. Pt also states continued, faint rash to abdomen.     Blood pressure 119/58, pulse 71, temperature 97.3 ??F (36.3 ??C), resp. rate 18, height 5' 2.21" (1.58 m), weight 67.5 kg (148 lb 14.4 oz), last menstrual period 04/13/2010.    Left chest port accessed with 0.75 in huber with no difficulty. Positive blood return noted.     Medications received:  NS IV  Herceptin IV (over 45 minutes)    Tolerated treatment well, no adverse reaction noted. Port de-accessed and flushed per protocol. Positive blood return noted post treatment.     Blood pressure 129/60, pulse 64, temperature 97.7 ??F (36.5 ??C), resp. rate 16, height 5' 2.21" (1.58 m), weight 67.5 kg (148 lb 14.4 oz), last menstrual period 04/13/2010.      1110  D/C'd from [facility] ambulatory and in no distress. Next appointment is 09/27/15 at 0900.

## 2015-09-06 NOTE — Progress Notes (Signed)
Problem: Chemotherapy Treatment  Goal: *Chemotherapy regimen followed  Outcome: Progressing Towards Goal  Patient to receive C7 of Herceptin today as ordered.

## 2015-09-19 ENCOUNTER — Encounter

## 2015-09-20 ENCOUNTER — Inpatient Hospital Stay: Admit: 2015-09-20 | Payer: MEDICARE | Primary: Internal Medicine

## 2015-09-20 DIAGNOSIS — I890 Lymphedema, not elsewhere classified: Secondary | ICD-10-CM

## 2015-09-20 NOTE — Progress Notes (Signed)
[facility]  [place]  [address]      OUTPATIENT physical Therapy Evaluation with CMS G codes    NAME: Jillian Bean AGE: 75 y.o.  GENDER: female  DATE: 09/20/2015  REFERRING PHYSICIAN: Steffanie Rainwater, MD   HISTORY AND BACKGROUND:This patient is a 75 y/o female with recent history of breast cancer, right, as noted below.  Pt states Dr. Elie Confer referred her for initial evaluation of potential lymphedema.  Pt without c/o swelling, shoulder dysfunction, or pain at this time. Pt reports port was placed 12/20/2014, and chemotherapy initiated 12/21/2014.  Currently continuing with Herceptin infusions thru May 2017.  Pt returns for assessment regarding lymphedema risk reduction.  Information from Dr. Valinda Hoar note as follows:  An abnormal mammogram led to a right core breast biopsy on 10/12/14 showing IDC, 7 mm, gr 3, ER negative, PR negative, ki67 15%, HER 2 positive (IHC 2+; FISH ratio 2.8; sig/cell 5.7).?? Right lumpectomy on 11/10/14 shows IDC, 1.7 cm, gr 3, 0/5 LN, no LVI.?? PT1cN0Mx.    Primary Diagnosis:  ?? 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 Malignant neoplasm of breast (80.1)  Date of Onset: 11/10/2014  Present Symptoms and Functional Limitations: s/p lumpectomy and SLNB R 11/10/2014.  Returns for assessment regarding lymphedema risk reduction.  LLIS: 0/72; [place]   Past Medical History:   Past Medical History   Diagnosis Date   ??? Back pain 09/01/2007   ??? Cancer ([facility])      R breast   ??? Coagulation disorder ([place])      IRON DEFFICIENCY ANEMIA   ??? Colonic polyps 08/31/1998   ??? 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 Hiatal hernia 07/11/2011     Dr.sobieski   ??? Hypercholesteremia 09/01/2007   ??? Hypothyroidism 09/01/2003   ??? 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 Nausea & vomiting    ??? OA (osteoarthritis) 09/01/2007   ??? Other ill-defined conditions(799.89)      VERTIGO   ??? Plantar fasciitis 09/01/2007   ??? S/P colonoscopy 10/10/2007   ??? Thyroid disease      Past Surgical History   Procedure Laterality Date   ??? Hx gyn       Hysterectomy in her 54's   ??? Pr breast surgery procedure unlisted  1993     Breast Reduction   ??? Endoscopy, colon, diagnostic  7/12/18/08     7/05 Dr Maudry Diego   ??? Hx other surgical       MINIMALLY INVASIVE LUMBAR DECOMPRESSION   ??? Hx gi       COLONOSCOPY   ??? Hx heent  04/07/10     thyroid biopsy neg dr Leward Quan   ??? Hx heent       wisdom teeth extraction   ??? Hx orthopaedic  08/22/09     left TKR   ??? Hx orthopaedic  08/13/12     right tkr     Current Medications:    Current Outpatient Prescriptions   Medication Sig   ??? diclofenac EC (VOLTAREN) 50 mg EC tablet Take 1 Tab by mouth two (2) times a day.   ??? colesevelam (WELCHOL) 625 mg tablet 1 po bid   ??? levothyroxine (SYNTHROID) 112 mcg tablet 8 per week   ??? docusate sodium (COLACE)  100 mg capsule Take 100 mg by mouth daily.   ??? 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 latanoprost (XALATAN) 0.005 % ophthalmic solution Administer  to both eyes nightly.   ??? lidocaine-prilocaine (EMLA) topical cream Apply  to affected area as needed for Pain.   ??? aspirin 81 mg chewable tablet Take 81 mg by mouth daily.   ??? multivitamin (ONE A DAY) tablet Take 1 Tab by mouth daily.   ??? cholecalciferol, vitamin d3, (VITAMIN D) 1,000 unit tablet Take  by mouth daily.     No current facility-administered medications for this encounter.      Allergies:   Allergies   Allergen Reactions   ??? Zocor [Simvastatin] Other (comments) and Diarrhea     Other reaction(s): Adverse reaction to substance   Myalgias     ??? Celebrex [Celecoxib] Other (comments)     Aggitation   ??? Gabapentin Other (comments)     FELT UNSTEADY ON MY FEET   ??? Levaquin [Levofloxacin] Palpitations   ??? Nexium [Esomeprazole Magnesium] Diarrhea   ??? Oxycontin [Oxycodone] Itching    ??? Pravastatin Nausea and Vomiting   ??? Yellow Dye Hives      Social/Work History and Prior Level of Function: Pt is retired from [employer].  No reported function limitations prior to breast cancer diagnosis/treatment.   Living Situation: lives with spouse.      Trainable Caregiver?:as needed   Self-care/ADLs: indep     Mobility: indep   Sleeping Arrangement:  bed   Adaptive Equipment Owned: walker/cane.   Other: na  Previous Therapy:  None to address potential for lymphedema.  Compression/Lymphedema Equipment:  Compression sleeve/gauntlet fit in clinic prior visit.    SUBJECTIVE:   Pt reports she is doing well.  States she is continuing Herceptin infusions which will conclude in May 2017.  Pt without c/o.   Patient???s goals for therapy: R UE assessed for lymphedema.    EVALUATION AND OBJECTIVE DATA SUMMARY:   Pain:no c/o pain  Pain Scale 1: Numeric (0 - 10)  Pain Intensity 1: 0     Skin and Tissue Assessment:  Dermal Status:  (x )  Intact ( )  Dry   ( )  Tenuous ( )  Flaky   ( )  Wound/lesion present ( )  Scars:    ( )  Dermatitis    Texture/Consistency: na  ( )  Boggy ( )  Pitting Edema   ( )  Brawny ( )  Combination   ( )  Fibrotic/Woody    Pigmentation/Color Change:  (x )  Normal ( )  Hemosiderin   ( )  Red ( )  Erythematous   ( )  Hyperpigmented ( )  Hyperlipodermatosclerosis   Anomalies: na  ( )  Lymphorrhea ( )  Vesicles   ( )  Petechiae ( )  Warty Vercusis   ( )  Bullae ( )  Papilloma     Nails:  (x )  Normal  ( )  Fungus  Stemmers Sign: negative  Height:  Height: '5\' 6"'  (167.6 cm)  Weight:  Weight: 66.7 kg (147 lb)   BMI:  BMI (calculated): 23.8  (36 or greater: adversely affecting lymphedema)  Volumetric Measurements:   Right:  1905.39 mL Left:  2010.51 mL   % Difference: -5.23% Dominance: Right   (See scanned graph)  Range of Motion: Bilateral UE grossly WFL.  Strength: bilateral UE 5/5 with MMT without c/o pain.  Sensation:  Intact  Mobility:  Bed/Chair Mobility:  indep Transfers:  indep    Sitting Balance:  good Standing Balance:  good   Gait:  indep community distances without device.  Pt walks regularly for exercise. Wheelchair Mobility:  na   Endurance:  Intact for daily activities, walking program Stairs:  Not assessed.       Safety:  Patient is alert and oriented:  x4   Safety awareness:  intact   Fall Risk?:  low   Patient given written fall prevention handout: Yes   Precautions:  Standard lymphedema precautions to include avoiding blood pressure readings, injections and IVs or other procedures/acts that could lead to broken skin on affected area, and avoiding excessive heat, resistive activity or altitude without compression garment    Evaluation Time: 11:00:11:21 am  21 minutes    TREATMENT PROVIDED:   1.  Treatment description:  The patient was instructed in lymphedema risk reduction tools, including wear of compression garment for moderate to high risk activities, as well as skin care to prevent infection.  Pt was instructed in the benefit of fitting compression garments to involved extremity for wear with exercise, air travel, repetitive UE activities, and intermittently while receiving radiation therapy.  Pt instructed in recommendation of having compression garments fit in clinic to ensure appropriate fit and appropriate wear/care instructions. Pt advised to notify clinic of any concerns that arise regarding lymphedema, encouraged to monitor upper arm due to 1 cm discrepancy comparing with uninvolved UE at time of initial evaluation, with measurements currently congruent. Pt instructed to continue skin care, including cleansing skin daily involved quadrant/UE, and applying low pH lotion to extremity using upward strokes.  Pt instructed in activities for management of AWS including pairing shoulder abd to 90/full extension elbow/alternating wrist flex/ext x 10, following with shoulder flexion full range with elbow extension as  tolerated, both done in supine.  Pt demonstrates and verbalizes good understanding of information and activities performed in clinic today.   Treatment time:  11:22 am-11:35 pm  Minutes: 13 minutes        ASSESSMENT:   Jillian Bean is a 75 y.o. female who presents with stage 0 lymphedema, R UE, with limb volume and circumferential axillary measurement, R UE compared with L UE are WNL.  Patient has been educated regarding lymphedema risk reduction, including fitting compression sleeve/gauntlet to be worn with high risk UE activities.  Pt is agreeable to proceeding with compression garment wear during high risk activities, skin care, bi-monthly circumference measurements R UE, seeking medical attention as needed.  Pt continuing with Herceptin infusions every three weeks.  Patient to return in 6 months to ensure limb volumes/circumferential measurements remain stable, with plan to discharge from care at time as indicated.    This care is medically necessary due to the infection risk with lymphedema, and to improve functional activities.  CDT is necessary to resolve swelling to allow patient to return to wearing normal clothes/footwear, and prevent worsening of symptoms, such as venous stasis ulcerations, infections, or hospitalizations.  Patient will be independent with home program strategies to allow improved ADL ability and mobility and to allow patient to return to greatest functional independence.    Rehabilitation potential is considered to be Excellent.  Factors which may influence rehabilitation potential include XRT to follow after chemotherapy.  Patient will benefit from 2-4 prn physical therapy visits over 10 weeks to optimize improvement in these areas          In compliance with  CMS???s Claims Based Outcome Reporting, the following G-code set was chosen for this patient based on their primary functional limitation being treated:    The outcome measure chosen to determine the severity of the functional  limitation was the LLIS  with a score of 0/72 which was correlated with the impairment scale.    ? Other PT/OT Primary Functional Limitations:    [phone] - CURRENT STATUS: CH - 0% impaired, limited or restricted   G8991 - GOAL STATUS: CH - 0% impaired, limited or restricted   Q2229 - D/C STATUS:  CH - 0% impaired, limited or restricted             Patient has participated in goal setting and agrees to work toward plan of care.  Patient was instructed to call if questions or concerns arise.    Thank you for this referral.  Kalman Shan, PT, CLT    Time Calculation: 35 mins    TREATMENT PLAN EFFECTIVE DATES:   09/20/2015   I have read the above plan of care for Kyrra E Trice.  I certify the above prescribed services are required by this patient and are medically necessary.  The above plan of care has been developed in conjunction with the lymphedema/physical therapist.       Physician Signature: ____________________________________Date:______________

## 2015-09-22 ENCOUNTER — Encounter: Primary: Internal Medicine

## 2015-09-22 MED ORDER — TRASTUZUMAB 440 MG IV SOLR
440 mg | Freq: Once | INTRAVENOUS | Status: AC
Start: 2015-09-22 — End: 2015-09-27
  Administered 2015-09-27: 16:00:00 via INTRAVENOUS

## 2015-09-22 MED ORDER — SODIUM CHLORIDE 0.9 % IV
INTRAVENOUS | Status: AC
Start: 2015-09-22 — End: 2015-09-27
  Administered 2015-09-27: 15:00:00 via INTRAVENOUS

## 2015-09-22 MED FILL — SODIUM CHLORIDE 0.9 % IV: INTRAVENOUS | Qty: 1000

## 2015-09-27 ENCOUNTER — Inpatient Hospital Stay: Admit: 2015-09-27 | Payer: MEDICARE | Primary: Internal Medicine

## 2015-09-27 ENCOUNTER — Ambulatory Visit: Admit: 2015-09-27 | Discharge: 2015-09-27 | Payer: MEDICARE | Attending: Nurse Practitioner | Primary: Internal Medicine

## 2015-09-27 DIAGNOSIS — C50911 Malignant neoplasm of unspecified site of right female breast: Secondary | ICD-10-CM

## 2015-09-27 DIAGNOSIS — Z5111 Encounter for antineoplastic chemotherapy: Secondary | ICD-10-CM

## 2015-09-27 MED ORDER — SODIUM CHLORIDE 0.9 % INJECTION
INTRAMUSCULAR | Status: AC | PRN
Start: 2015-09-27 — End: 2015-09-28
  Administered 2015-09-27: 14:00:00 via INTRAVENOUS

## 2015-09-27 MED ORDER — HEPARIN, PORCINE (PF) 100 UNIT/ML IV SYRINGE
100 unit/mL | INTRAVENOUS | Status: AC | PRN
Start: 2015-09-27 — End: 2015-09-28
  Administered 2015-09-27: 16:00:00 via INTRAVENOUS

## 2015-09-27 MED ORDER — SODIUM CHLORIDE 0.9 % IJ SYRG
INTRAMUSCULAR | Status: AC | PRN
Start: 2015-09-27 — End: 2015-09-28
  Administered 2015-09-27: 16:00:00 via INTRAVENOUS

## 2015-09-27 MED FILL — HERCEPTIN 440 MG INTRAVENOUS SOLUTION: 440 mg | INTRAVENOUS | Qty: 405

## 2015-09-27 NOTE — Progress Notes (Signed)
[facility]ISIT NOTE    0915  Pt arrived at [facility] ambulatory and in no distress for C8 Herceptin.  Last echo done 12//16 with EF 66%.  Assessment completed, pt c/o right leg, hip and foot pain from sciatica and spinal stenosis relived with prn Tylenol.    Blood pressure 123/62, pulse 69, temperature 97.5 ??F (36.4 ??C), resp. rate 18, weight 66.5 kg (146 lb 8 oz), last menstrual period 04/13/2010.    Left chest port accessed with 0.75 in huber no difficulty. Positive blood return noted.  Patient proceeded to MD clinic visit.    1030 Patient returned from MD clinic.    Medications received:  Herceptin IV    Blood pressure 118/58, pulse 68, temperature 97 ??F (36.1 ??C), resp. rate 18, weight 66.5 kg (146 lb 8 oz), last menstrual period 04/13/2010.    Tolerated treatment well, no adverse reaction noted. Port de-accessed and flushed per protocol. Positive blood return noted.    1110  D/C'd from [facility] ambulatory and in no distress. Next appointment is 10/18/15 at 0900.

## 2015-09-27 NOTE — Progress Notes (Signed)
[facility]  [address], [place]   [address]  W: [phone]   F: [phone]      F/u HEME/ONC CONSULT    Reason for visit:  evaluation for treatment for breast cancer    Consulting physician:  Dr. Gilford Rile    HPI:   Jillian Bean is a 75 y.o.  female who I was asked to see in consultation at the request of Dr. Elie Confer for evaluation for systemic therapy for breast cancer.    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 Right lumpectomy on 11/10/14 shows IDC, 1.7 cm, gr 3, 0/5 LN, no LVI.  PT1cN0Mx.    TCH q 3 weeks x 6: 12/21/14- 04/12/15  C#5 held 1 week on 03/15/15 due to thrombocytopenia   Outback Herceptin: 05/03/15-    S/p XRT 06/03/15    Interval History: In today for Herceptin #8. Complains of lower back pain, 4/10, gr 1 skin rash, gr 1 neuropathy. Her low back pain has flared up. Her right buttocks gets numb and she has pain down to the back of her knee. She had this before, about 2-3 years ago.     DX   Encounter Diagnoses   Name Primary?   ??? Malignant neoplasm of right female breast, unspecified site of breast ([place]) Yes   ??? Rash and other nonspecific skin eruption    ??? Osteoporosis    ??? Acute right-sided low back pain with right-sided sciatica    ??? Encounter for monitoring cardiotoxic drug therapy        Past Medical History   Diagnosis Date   ??? Back pain 09/01/2007   ??? Cancer ([place])      R breast   ??? Coagulation disorder ([place])      IRON DEFFICIENCY ANEMIA   ??? Colonic polyps 08/31/1998   ??? 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 Hiatal hernia 07/11/2011     Dr.sobieski   ??? Hypercholesteremia 09/01/2007   ??? Hypothyroidism 09/01/2003   ??? 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 Nausea & vomiting    ??? OA (osteoarthritis) 09/01/2007    ??? Other ill-defined conditions(799.89)      VERTIGO   ??? Plantar fasciitis 09/01/2007   ??? S/P colonoscopy 10/10/2007   ??? Thyroid disease      Past Surgical History   Procedure Laterality Date   ??? Hx gyn       Hysterectomy in her 40's   ??? Pr breast surgery procedure unlisted  1993     Breast Reduction   ??? Endoscopy, colon, diagnostic  7/12/18/08     7/05 Dr Maudry Diego   ??? Hx other surgical       MINIMALLY INVASIVE LUMBAR DECOMPRESSION   ??? Hx gi       COLONOSCOPY   ??? Hx heent  04/07/10     thyroid biopsy neg dr Leward Quan   ??? Hx heent       wisdom teeth extraction   ??? Hx orthopaedic  08/22/09     left TKR   ??? Hx orthopaedic  08/13/12     right tkr     Social History     Social History   ???  Marital status: MARRIED     Spouse name: N/A   ??? Number of children: N/A   ??? Years of education: N/A     Social History Main Topics   ??? Smoking status: Never Smoker   ??? Smokeless tobacco: Never Used   ??? Alcohol use No   ??? Drug use: No   ??? Sexual activity: Not Currently     Other Topics Concern   ??? None     Social History Narrative     Family History   Problem Relation Age of Onset   ??? Cancer Mother      Breast   ??? Heart Failure Father      MI   ??? Seizures Brother      ms       Current Outpatient Prescriptions   Medication Sig Dispense Refill   ??? diclofenac EC (VOLTAREN) 50 mg EC tablet Take 1 Tab by mouth two (2) times a day. 60 Tab 2   ??? colesevelam (WELCHOL) 625 mg tablet 1 po bid 180 Tab 2   ??? levothyroxine (SYNTHROID) 112 mcg tablet 8 per week 100 Tab 3   ??? docusate sodium (COLACE) 100 mg capsule Take 100 mg by mouth daily.     ??? 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 latanoprost (XALATAN) 0.005 % ophthalmic solution Administer  to both eyes nightly.  6   ??? aspirin 81 mg chewable tablet Take 81 mg by mouth daily.     ??? multivitamin (ONE A DAY) tablet Take 1 Tab by mouth daily.     ??? cholecalciferol, vitamin d3, (VITAMIN D) 1,000 unit tablet Take  by mouth daily.      ??? lidocaine-prilocaine (EMLA) topical cream Apply  to affected area as needed for Pain. 30 g 0     Facility-Administered Medications Ordered in Other Visits   Medication Dose Route Frequency Provider Last Rate Last Dose   ??? heparin (porcine) pf 500 Units  500 Units IntraVENous PRN Annie Paras, MD       ??? sodium chloride (NS) flush 10 mL  10 mL IntraVENous PRN Annie Paras, MD       ??? sodium chloride 0.9 % injection 10 mL  10 mL IntraVENous PRN Annie Paras, MD   10 mL at 09/27/15 0925   ??? 0.9% sodium chloride infusion  25 mL/hr IntraVENous CONTINUOUS Joyce Gross, MD       ??? trastuzumab (HERCEPTIN) 405 mg in 0.9% sodium chloride 250 mL, overfill volume 25 mL IVPB  405 mg IntraVENous ONCE Joyce Gross, MD           Allergies   Allergen Reactions   ??? Zocor [Simvastatin] Other (comments) and Diarrhea     Other reaction(s): Adverse reaction to substance   Myalgias     ??? Celebrex [Celecoxib] Other (comments)     Aggitation   ??? Gabapentin Other (comments)     FELT UNSTEADY ON MY FEET   ??? Levaquin [Levofloxacin] Palpitations   ??? Nexium [Esomeprazole Magnesium] Diarrhea   ??? Oxycontin [Oxycodone] Itching   ??? Pravastatin Nausea and Vomiting   ??? Yellow Dye Hives       Review of Systems    A comprehensive review of systems was performed and all systems were negative except for HPI and for the symptom report form, reviewed and scanned in.    Objective:  Physical Exam:  Visit Vitals   ??? BP  123/62   ??? Pulse 69   ??? Temp 97.5 ??F (36.4 ??C) (Temporal)   ??? Resp 18   ??? Ht '5\' 6"'  (1.676 m)   ??? Wt 146 lb 8 oz (66.5 kg)   ??? LMP 04/13/2010   ??? SpO2 99%   ??? BMI 23.65 kg/m2       General:  Alert, cooperative, no distress, appears stated age.   Head:  Normocephalic, without obvious abnormality, atraumatic.   Eyes:  Conjunctivae/corneas clear. PERRL, EOMs intact.   Throat: Lips, mucosa, and tongue normal.    Neck: Supple, symmetrical, trachea midline, no adenopathy, thyroid: no enlargement/tenderness/nodules    Back:   Symmetric, no curvature. ROM normal. No CVA tenderness.   Lungs:   Clear to auscultation bilaterally.   Chest wall:  No tenderness or deformity.   Heart:  Regular rate and rhythm, S1, S2 normal, no murmur, click, rub or gallop.   Breast Exam:  S/p R lumpectomy.    Abdomen:   Soft, non-tender. Bowel sounds normal. No masses,  No organomegaly.   Extremities: Extremities normal, atraumatic, no cyanosis or edema.   Skin: Improved macular rash on elbows, back, abdomen lower, upper thighs, back of legs   Lymph nodes: Cervical, supraclavicular, and axillary nodes normal.   Neurologic: CNII-XII intact.     Diagnostic Imaging     12/16/14 TTE: EF 71%.  03/02/15 TTE: EF 68%  05/30/15 TTE :  EF 65%  07/21/15 TTE: EF 66%    Lab Results  Lab Results   Component Value Date/Time    WBC 6.2 06/21/2015 10:41 AM    HGB 11.5 06/21/2015 10:41 AM    HCT 34.8 06/21/2015 10:41 AM    PLATELET 172 06/21/2015 10:41 AM    MCV 95 06/21/2015 10:41 AM       Lab Results   Component Value Date/Time    Sodium 144 06/21/2015 10:41 AM    Potassium 4.1 06/21/2015 10:41 AM    Chloride 102 06/21/2015 10:41 AM    CO2 25 06/21/2015 10:41 AM    Anion gap 7 02/22/2015 09:56 AM    Glucose 84 06/21/2015 10:41 AM    BUN 24 06/21/2015 10:41 AM    Creatinine 0.71 06/21/2015 10:41 AM    BUN/Creatinine ratio 34 06/21/2015 10:41 AM    GFR est AA 96 06/21/2015 10:41 AM    GFR est non-AA 84 06/21/2015 10:41 AM    Calcium 8.9 06/21/2015 10:41 AM    AST (SGOT) 22 06/21/2015 10:41 AM    Alk. phosphatase 78 06/21/2015 10:41 AM    Protein, total 6.6 06/21/2015 10:41 AM    Albumin 4.5 06/21/2015 10:41 AM    Globulin 2.5 04/12/2015 09:23 AM    A-G Ratio 2.1 06/21/2015 10:41 AM    ALT (SGPT) 23 06/21/2015 10:41 AM     Assessment/Plan:  75 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  PS 0    1. Right Breast cancer stage: IA    Hormonal therapy: not indicated due to receptor (-) status     We explained to the patient that the goal of systemic adjuvant therapy is to improve the chances for cure and decrease the risk of relapse. We explained why a patient can have microscopic cancer spread now even though physical examination, laboratory studies and imaging studies are negative for cancer. We explained that the same treatments used now as adjuvant or preventive treatments rarely  if ever are curative in women who develop metastases.     She is in excellent health and thus an adjuvant discussion is warranted.  trastuzumab based therapy corresponds to a 9% overall survival benefit, which is rather larger overall (that corresponds to a nearly 20% DFS benefit).     Rationale for therapy with trastuzumab was also discussed with the patient including a 50% proportional improvement in disease free survival and also an improvement in overall survival in patients receiving trastuzumab and chemotherapy for HER-2 positive breast cancer.?? The side effects of trastuzumab were discussed including a 4%-5% risk of dropping her ejection fraction while on treatment and about a 1% risk of CHF.?? We discussed that this drug will be used every 3 weeks for remainder of a year following the chemotherapy cycles.?? We will check her EF before chemotherapy and every 3 months while she is receiving trastuzumab.    She is not a candidate to receive pertuzumab with her stage I disease.    Last TTE 07/21/15, stable. Repeat due before 10/19/14, ordered.     Next mammogram due March 2017, scheduled 10/26/14.      herceptin #8 today, will see her back in 6 weeks for #10.    2. Emotional well being: She has excellent support and is coping well with her disease.    3. FH of breast cancer: BRCA 1/2 [place] testing negative    4. Rash: almost resolved; saw Dr. Ronney Lion, dermatology.      5. Osteoporosis: On dexa from 2015; tried fosamax from Dr. Wanda Plump, stopped due to rash. She sees Dr. Wanda Plump next month. DEXA due 02/2016.      6. Back pain/radiculopathy: new, but had similar pain a few years ago which was relieved with an injection and a knee replacement. Dr. Dema Severin, pain specialist, was following her. Ordered NM bone scan to rule out mets and if negative will send her back to Dr. Dema Severin.     Thank you for this consult.  All of the patient's questions were answered today.          Follow-up Disposition:  Return in about 6 weeks (around 11/08/2015) for fu, sand/Lamondre Wesche, herceptin 10.    Sonda Rumble, MD

## 2015-09-27 NOTE — Patient Instructions (Signed)
Come see us in 6 weeks.

## 2015-09-27 NOTE — Progress Notes (Signed)
Jillian Bean is a 74 y.o. female here for follow-up of breast cancer.

## 2015-09-29 MED ORDER — LORAZEPAM 1 MG TAB
1 mg | ORAL_TABLET | Freq: Once | ORAL | 0 refills | Status: AC
Start: 2015-09-29 — End: 2015-09-29

## 2015-09-29 NOTE — Telephone Encounter (Signed)
Patient called for Jillian Bean stating she is scheduled to have a bone scan on Monday but isn't quite sure what the machine looks like and was thinking maybe we should call her in some anxiety medication since she has panic attacks. Call back number [phone]

## 2015-09-29 NOTE — Telephone Encounter (Signed)
Called the patient's listed pharmacy and left a message with two patient identifiers and a prescription for one 1 mg tablet of Lorazepam.  Paper prescription shredded and discarded.

## 2015-10-03 ENCOUNTER — Inpatient Hospital Stay: Admit: 2015-10-03 | Payer: MEDICARE | Attending: Nurse Practitioner | Primary: Internal Medicine

## 2015-10-03 DIAGNOSIS — M545 Low back pain: Secondary | ICD-10-CM

## 2015-10-03 MED ORDER — TECHNETIUM TC 99M MEDRONATE IV KIT
Freq: Once | Status: AC
Start: 2015-10-03 — End: 2015-10-03
  Administered 2015-10-03: 21:00:00 via INTRAVENOUS

## 2015-10-03 MED FILL — TECHNETIUM TC 99M MEDRONATE IV KIT: Qty: 26

## 2015-10-03 NOTE — Telephone Encounter (Signed)
Patient left voicemail stating that she is supposed to have an Echo and she has not heard anything. CB# [phone]

## 2015-10-03 NOTE — Progress Notes (Signed)
Left message for patient to return call when she is free. Will order lumbar xr series to further evaluate degeneration.

## 2015-10-03 NOTE — Telephone Encounter (Signed)
Called the patient and verified ID x 2.  Informed the patient that she is scheduled for an ECHO this Thursday 10/06/15 at 11:00 am at [facility].  The patient verbalized understanding and stated that she is able to make that date and time.  The patient denied any further questions or concerns.

## 2015-10-06 ENCOUNTER — Inpatient Hospital Stay: Admit: 2015-10-06 | Payer: MEDICARE | Primary: Internal Medicine

## 2015-10-06 ENCOUNTER — Institutional Professional Consult (permissible substitution): Admit: 2015-10-06 | Payer: MEDICARE | Primary: Internal Medicine

## 2015-10-06 ENCOUNTER — Encounter

## 2015-10-06 DIAGNOSIS — M5440 Lumbago with sciatica, unspecified side: Secondary | ICD-10-CM

## 2015-10-06 DIAGNOSIS — C50911 Malignant neoplasm of unspecified site of right female breast: Secondary | ICD-10-CM

## 2015-10-06 NOTE — Progress Notes (Signed)
Advised patient of the results. She will reach out to her ortho doctor to see if there is any intervention available.

## 2015-10-06 NOTE — Progress Notes (Signed)
The patient was identified by name and date of birth. The test was explained to patient and questions were answered prior to testing.

## 2015-10-13 MED ORDER — OVERFILL VOLUME
440 mg | Freq: Once | INTRAVENOUS | Status: AC
Start: 2015-10-13 — End: 2015-10-18
  Administered 2015-10-18: 15:00:00 via INTRAVENOUS

## 2015-10-13 MED ORDER — SODIUM CHLORIDE 0.9 % IV
INTRAVENOUS | Status: AC
Start: 2015-10-13 — End: 2015-10-18
  Administered 2015-10-18: 15:00:00 via INTRAVENOUS

## 2015-10-13 MED FILL — SODIUM CHLORIDE 0.9 % IV: INTRAVENOUS | Qty: 1000

## 2015-10-18 ENCOUNTER — Inpatient Hospital Stay: Admit: 2015-10-18 | Payer: MEDICARE | Primary: Internal Medicine

## 2015-10-18 DIAGNOSIS — Z5111 Encounter for antineoplastic chemotherapy: Secondary | ICD-10-CM

## 2015-10-18 MED ORDER — HEPARIN, PORCINE (PF) 100 UNIT/ML IV SYRINGE
100 unit/mL | INTRAVENOUS | Status: AC | PRN
Start: 2015-10-18 — End: 2015-10-19
  Administered 2015-10-18: 16:00:00 via INTRAVENOUS

## 2015-10-18 MED ORDER — SODIUM CHLORIDE 0.9 % IJ SYRG
INTRAMUSCULAR | Status: DC | PRN
Start: 2015-10-18 — End: 2015-10-22
  Administered 2015-10-18: 16:00:00 via INTRAVENOUS

## 2015-10-18 MED ORDER — SODIUM CHLORIDE 0.9 % INJECTION
INTRAMUSCULAR | Status: AC | PRN
Start: 2015-10-18 — End: 2015-10-19
  Administered 2015-10-18: 14:00:00 via INTRAVENOUS

## 2015-10-18 MED FILL — HERCEPTIN 440 MG INTRAVENOUS SOLUTION: 440 mg | INTRAVENOUS | Qty: 405

## 2015-10-18 NOTE — Progress Notes (Signed)
[facility] VISIT NOTE    Pt arrived at [facility] ambulatory and in no distress for C9 Herceptin.  Assessment completed, pt had no complaints.  Patient Vitals for the past 12 hrs:   Temp Pulse Resp BP   10/18/15 1115 96.8 ??F (36 ??C) 65 18 129/73   10/18/15 0911 97 ??F (36.1 ??C) 70 18 115/64     Port accessed with .75  in huber no difficulty. Positive blood return noted.     Medications received:  Herceptin IV    Tolerated treatment well, no adverse reaction noted. Port de-accessed and flushed per protocol. Positive blood return noted.    D/C'd from [facility] ambulatory and in no distress.  Next appointment is 11/08/15 at (;00.

## 2015-10-18 NOTE — Progress Notes (Signed)
Problem: Chemotherapy Treatment  Goal: *Chemotherapy regimen followed  Outcome: Progressing Towards Goal  Pt here for C9 Herceptin

## 2015-10-31 ENCOUNTER — Ambulatory Visit: Admit: 2015-10-31 | Discharge: 2015-10-31 | Payer: MEDICARE | Attending: Internal Medicine | Primary: Internal Medicine

## 2015-10-31 DIAGNOSIS — M4726 Other spondylosis with radiculopathy, lumbar region: Secondary | ICD-10-CM

## 2015-10-31 MED ORDER — CYCLOBENZAPRINE 10 MG TAB
10 mg | ORAL_TABLET | Freq: Every evening | ORAL | 0 refills | Status: DC | PRN
Start: 2015-10-31 — End: 2016-08-27

## 2015-10-31 MED ORDER — DICLOFENAC 50 MG TAB, DELAYED RELEASE
50 mg | ORAL_TABLET | Freq: Two times a day (BID) | ORAL | 5 refills | Status: DC
Start: 2015-10-31 — End: 2015-11-29

## 2015-10-31 NOTE — Progress Notes (Signed)
HISTORY OF PRESENT ILLNESS  Jillian Bean is a 75 y.o. female.  HPI  Fallen has completed chemo followed by radiation this September for breast cancer.  She did very well with the treatment.  She is now on Diclofenac 50 b.i.d. for arthritis, including back pain, with right sided sciatica.  She's not had significant bruising.  She had a bone scan which was negative for metastatic disease, plain films, which do show degenerative disc disease L4-5 and L2-L3 with some right sided radiation, although no weakness.  She is traveling to [place] in June and wonders about other strategies to help with the back pain.  I am writing for muscle relaxer prn.  We discussed yoga.  She's also taking 2,000 of Tylenol daily, which is reasonable.      Review of Systems   Constitutional: Negative for chills, fever and weight loss.   Respiratory: Negative for cough, shortness of breath and wheezing.    Cardiovascular: Negative for chest pain, palpitations, orthopnea, leg swelling and PND.   Gastrointestinal: Negative for heartburn and nausea.   Musculoskeletal: Positive for back pain. Negative for falls and myalgias.   Neurological: Negative for dizziness, sensory change, focal weakness and headaches.       Physical Exam   Constitutional: She is oriented to person, place, and time. She appears well-developed and well-nourished.   HENT:   Head: Normocephalic and atraumatic.   Neck: Normal range of motion. Neck supple. Carotid bruit is not present. No thyromegaly present.   Cardiovascular: Normal rate, regular rhythm, S1 normal, S2 normal, normal heart sounds and intact distal pulses.    No murmur heard.  Pulmonary/Chest: Effort normal and breath sounds normal. No respiratory distress. She has no wheezes. She has no rales.   Musculoskeletal: Normal range of motion. She exhibits no edema or tenderness.   Neurological: She is alert and oriented to person, place, and time.    Psychiatric: She has a normal mood and affect. Her behavior is normal.   Nursing note and vitals reviewed.      ASSESSMENT and PLAN  Johniya was seen today for medication evaluation and thyroid problem.    Diagnoses and all orders for this visit:    Osteoarthritis of spine with radiculopathy, lumbar region  -     cyclobenzaprine (FLEXERIL) 10 mg tablet; Take 1 Tab by mouth nightly as needed for Muscle Spasm(s).  -     diclofenac EC (VOLTAREN) 50 mg EC tablet; Take 1 Tab by mouth two (2) times a day.    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

## 2015-11-02 MED ORDER — SODIUM CHLORIDE 0.9 % IV
INTRAVENOUS | Status: AC
Start: 2015-11-02 — End: 2015-11-08
  Administered 2015-11-08: 15:00:00 via INTRAVENOUS

## 2015-11-02 MED ORDER — SODIUM CHLORIDE 0.9 % IV
440 mg | Freq: Once | INTRAVENOUS | Status: AC
Start: 2015-11-02 — End: 2015-11-08
  Administered 2015-11-08: 16:00:00 via INTRAVENOUS

## 2015-11-02 MED FILL — SODIUM CHLORIDE 0.9 % IV: INTRAVENOUS | Qty: 1000

## 2015-11-08 ENCOUNTER — Ambulatory Visit: Admit: 2015-11-08 | Discharge: 2015-11-08 | Payer: MEDICARE | Attending: Nurse Practitioner | Primary: Internal Medicine

## 2015-11-08 ENCOUNTER — Inpatient Hospital Stay: Admit: 2015-11-08 | Payer: MEDICARE | Primary: Internal Medicine

## 2015-11-08 DIAGNOSIS — C50911 Malignant neoplasm of unspecified site of right female breast: Secondary | ICD-10-CM

## 2015-11-08 MED ORDER — SODIUM CHLORIDE 0.9 % INJECTION
INTRAMUSCULAR | Status: AC | PRN
Start: 2015-11-08 — End: 2015-11-09
  Administered 2015-11-08: 13:00:00 via INTRAVENOUS

## 2015-11-08 MED ORDER — SODIUM CHLORIDE 0.9 % IJ SYRG
INTRAMUSCULAR | Status: AC | PRN
Start: 2015-11-08 — End: 2015-11-09
  Administered 2015-11-08 (×2): via INTRAVENOUS

## 2015-11-08 MED ORDER — HEPARIN, PORCINE (PF) 100 UNIT/ML IV SYRINGE
100 unit/mL | INTRAVENOUS | Status: AC | PRN
Start: 2015-11-08 — End: 2015-11-09
  Administered 2015-11-08: 16:00:00 via INTRAVENOUS

## 2015-11-08 MED FILL — HERCEPTIN 440 MG INTRAVENOUS SOLUTION: 440 mg | INTRAVENOUS | Qty: 405

## 2015-11-08 NOTE — Progress Notes (Signed)
[facility] VISIT NOTE    Pt arrived at [facility] ambulatory and in no distress for C10 Herceptin.  Assessment completed, pt had no complaints.  Patient Vitals for the past 12 hrs:   Temp Pulse Resp BP SpO2   11/08/15 1226 97 ??F (36.1 ??C) 66 16 113/61 -   11/08/15 0906 97.1 ??F (36.2 ??C) 78 18 112/63 97 %      Leftchest port accessed with .75  in huber no difficulty. Positive blood return noted.     Medications received:  Herceptin IV    Tolerated treatment well, no adverse reaction noted. Port de-accessed and flushed per protocol. Positive blood return noted.    D/C'd from [facility] ambulatory and in no distress.  Next appointment is 11/29/15 at 9:00.

## 2015-11-08 NOTE — Patient Instructions (Addendum)
Come see us in 3 weeks.

## 2015-11-08 NOTE — Progress Notes (Signed)
Problem: Chemotherapy Treatment  Goal: *Chemotherapy regimen followed  Outcome: Progressing Towards Goal  Pt here for C10 Herceptin

## 2015-11-08 NOTE — Progress Notes (Signed)
[facility]  [address], [place]   [address]  W: [phone]   F: [phone]      F/u HEME/ONC CONSULT    Reason for visit:  evaluation for treatment for breast cancer    Consulting physician:  Dr. Gilford Rile    HPI:   Jillian Bean is a 75 y.o.  female who I was asked to see in consultation at the request of Dr. Elie Confer for evaluation for systemic therapy for breast cancer.    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 Right lumpectomy on 11/10/14 shows IDC, 1.7 cm, gr 3, 0/5 LN, no LVI.  PT1cN0Mx.    TCH q 3 weeks x 6: 12/21/14- 04/12/15  C#5 held 1 week on 03/15/15 due to thrombocytopenia   Outback Herceptin: 05/03/15-    S/p XRT 06/03/15    Interval History: In today for Herceptin #10. Complains of lower back pain, 5/10, gr 2 swelling. She is feeling well today. Her back pain is still there but she is taking tylenol with some relief.     DX   Encounter Diagnoses   Name Primary?   ??? Malignant neoplasm of right female breast, unspecified site of breast ([place]) Yes   ??? Osteoporosis    ??? Acute right-sided low back pain with right-sided sciatica      Past Medical History:   Diagnosis Date   ??? Back pain 09/01/2007   ??? Cancer (HCC)     R breast   ??? Coagulation disorder ([place])     IRON DEFFICIENCY ANEMIA   ??? Colonic polyps 08/31/1998   ??? 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 Hiatal hernia 07/11/2011    Dr.sobieski   ??? Hypercholesteremia 09/01/2007   ??? Hypothyroidism 09/01/2003   ??? 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 Nausea & vomiting    ??? OA (osteoarthritis) 09/01/2007   ??? Other ill-defined conditions(799.89)     VERTIGO   ??? Plantar fasciitis 09/01/2007   ??? S/P colonoscopy 10/10/2007   ??? Thyroid disease      Past Surgical History:    Procedure Laterality Date   ??? BREAST SURGERY PROCEDURE UNLISTED  1993    Breast Reduction   ??? ENDOSCOPY, COLON, DIAGNOSTIC  7/12/18/08    7/05 Dr Maudry Diego   ??? HX GI      COLONOSCOPY   ??? HX GYN      Hysterectomy in her 60's   ??? HX HEENT  04/07/10    thyroid biopsy neg dr Leward Quan   ??? HX HEENT      wisdom teeth extraction   ??? HX ORTHOPAEDIC  08/22/09    left TKR   ??? HX ORTHOPAEDIC  08/13/12    right tkr   ??? HX OTHER SURGICAL      MINIMALLY INVASIVE LUMBAR DECOMPRESSION     Social History     Social History   ??? Marital status: MARRIED     Spouse name: N/A   ??? Number of children: N/A   ??? Years of education: N/A     Social History Main Topics   ??? Smoking status: Never Smoker   ??? Smokeless tobacco: Never Used   ??? Alcohol use No   ???  Drug use: No   ??? Sexual activity: Not Currently     Other Topics Concern   ??? None     Social History Narrative     Family History   Problem Relation Age of Onset   ??? Cancer Mother      Breast   ??? Heart Failure Father      MI   ??? Seizures Brother      ms       Current Outpatient Prescriptions   Medication Sig Dispense Refill   ??? diclofenac EC (VOLTAREN) 50 mg EC tablet Take 1 Tab by mouth two (2) times a day. 60 Tab 5   ??? colesevelam (WELCHOL) 625 mg tablet 1 po bid 180 Tab 2   ??? levothyroxine (SYNTHROID) 112 mcg tablet 8 per week 100 Tab 3   ??? docusate sodium (COLACE) 100 mg capsule Take 100 mg by mouth daily.     ??? 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 latanoprost (XALATAN) 0.005 % ophthalmic solution Administer  to both eyes nightly.  6   ??? aspirin 81 mg chewable tablet Take 81 mg by mouth daily.     ??? multivitamin (ONE A DAY) tablet Take 1 Tab by mouth daily.     ??? cholecalciferol, vitamin d3, (VITAMIN D) 1,000 unit tablet Take  by mouth daily.     ??? acetaminophen (TYLENOL) 500 mg tablet Take  by mouth every six (6) hours as needed for Pain.     ??? cyclobenzaprine (FLEXERIL) 10 mg tablet Take 1 Tab by mouth nightly as needed for Muscle Spasm(s). 30 Tab 0    ??? lidocaine-prilocaine (EMLA) topical cream Apply  to affected area as needed for Pain. 30 g 0     Facility-Administered Medications Ordered in Other Visits   Medication Dose Route Frequency Provider Last Rate Last Dose   ??? sodium chloride 0.9 % injection 10 mL  10 mL IntraVENous PRN Annie Paras, MD   10 mL at 11/08/15 1610   ??? heparin (porcine) pf 500 Units  500 Units IntraVENous PRN Annie Paras, MD       ??? sodium chloride (NS) flush 10-40 mL  10-40 mL IntraVENous PRN Annie Paras, MD   10 mL at 11/08/15 9604   ??? 0.9% sodium chloride infusion  25 mL/hr IntraVENous CONTINUOUS Joyce Gross, MD 25 mL/hr at 11/08/15 1043 25 mL/hr at 11/08/15 1043   ??? trastuzumab (HERCEPTIN) 405 mg in 0.9% sodium chloride 250 mL, overfill volume 25 mL IVPB  405 mg IntraVENous ONCE Joyce Gross, MD           Allergies   Allergen Reactions   ??? Zocor [Simvastatin] Other (comments) and Diarrhea     Other reaction(s): Adverse reaction to substance   Myalgias     ??? Celebrex [Celecoxib] Other (comments)     Aggitation   ??? Gabapentin Other (comments)     FELT UNSTEADY ON MY FEET   ??? Levaquin [Levofloxacin] Palpitations   ??? Nexium [Esomeprazole Magnesium] Diarrhea   ??? Oxycontin [Oxycodone] Itching   ??? Pravastatin Nausea and Vomiting   ??? Yellow Dye Hives       Review of Systems    A comprehensive review of systems was performed and all systems were negative except for HPI and for the symptom report form, reviewed and scanned in.    Objective:  Physical Exam:  Visit Vitals   ??? BP 112/63   ???  Pulse 78   ??? Temp 97.1 ??F (36.2 ??C) (Temporal)   ??? Resp 18   ??? Ht '5\' 5"'  (1.651 m)   ??? Wt 144 lb (65.3 kg)   ??? LMP 04/13/2010   ??? SpO2 97%   ??? BMI 23.96 kg/m2       General:  Alert, cooperative, no distress, appears stated age.   Head:  Normocephalic, without obvious abnormality, atraumatic.   Eyes:  Conjunctivae/corneas clear. PERRL, EOMs intact.   Throat: Lips, mucosa, and tongue normal.     Neck: Supple, symmetrical, trachea midline, no adenopathy, thyroid: no enlargement/tenderness/nodules   Back:   Symmetric, no curvature. ROM normal. No CVA tenderness.   Lungs:   Clear to auscultation bilaterally.   Chest wall:  No tenderness or deformity.   Heart:  Regular rate and rhythm, S1, S2 normal, no murmur, click, rub or gallop.   Breast Exam:  S/p R lumpectomy.    Abdomen:   Soft, non-tender. Bowel sounds normal. No masses,  No organomegaly.   Extremities: Extremities normal, atraumatic, no cyanosis or edema.   Skin: Improved macular rash on elbows, back, abdomen lower, upper thighs, back of legs   Lymph nodes: Cervical, supraclavicular, and axillary nodes normal.   Neurologic: CNII-XII intact.     Diagnostic Imaging     12/16/14 TTE: EF 71%.  03/02/15 TTE: EF 68%  05/30/15 TTE :  EF 65%  07/21/15 TTE: EF 66%  10/06/15 TTE: EF 62%    Lab Results  Lab Results   Component Value Date/Time    WBC 6.2 06/21/2015 10:41 AM    HGB 11.5 06/21/2015 10:41 AM    HCT 34.8 06/21/2015 10:41 AM    PLATELET 172 06/21/2015 10:41 AM    MCV 95 06/21/2015 10:41 AM     Lab Results   Component Value Date/Time    Sodium 144 06/21/2015 10:41 AM    Potassium 4.1 06/21/2015 10:41 AM    Chloride 102 06/21/2015 10:41 AM    CO2 25 06/21/2015 10:41 AM    Anion gap 7 02/22/2015 09:56 AM    Glucose 84 06/21/2015 10:41 AM    BUN 24 06/21/2015 10:41 AM    Creatinine 0.71 06/21/2015 10:41 AM    BUN/Creatinine ratio 34 06/21/2015 10:41 AM    GFR est AA 96 06/21/2015 10:41 AM    GFR est non-AA 84 06/21/2015 10:41 AM    Calcium 8.9 06/21/2015 10:41 AM    AST (SGOT) 22 06/21/2015 10:41 AM    Alk. phosphatase 78 06/21/2015 10:41 AM    Protein, total 6.6 06/21/2015 10:41 AM    Albumin 4.5 06/21/2015 10:41 AM    Globulin 2.5 04/12/2015 09:23 AM    A-G Ratio 2.1 06/21/2015 10:41 AM    ALT (SGPT) 23 06/21/2015 10:41 AM     Assessment/Plan:  75 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   PS 0    1. Right Breast cancer stage: IA    Hormonal therapy: not indicated due to receptor (-) status    We explained to the patient that the goal of systemic adjuvant therapy is to improve the chances for cure and decrease the risk of relapse. We explained why a patient can have microscopic cancer spread now even though physical examination, laboratory studies and imaging studies are negative for cancer. We explained that the same treatments used now as adjuvant or preventive treatments rarely if ever are  curative in women who develop metastases.     She is in excellent health and thus an adjuvant discussion is warranted.  trastuzumab based therapy corresponds to a 9% overall survival benefit, which is rather larger overall (that corresponds to a nearly 20% DFS benefit).     Rationale for therapy with trastuzumab was also discussed with the patient including a 50% proportional improvement in disease free survival and also an improvement in overall survival in patients receiving trastuzumab and chemotherapy for HER-2 positive breast cancer.?? The side effects of trastuzumab were discussed including a 4%-5% risk of dropping her ejection fraction while on treatment and about a 1% risk of CHF.?? We discussed that this drug will be used every 3 weeks for remainder of a year following the chemotherapy cycles.?? We will check her EF before chemotherapy and every 3 months while she is receiving trastuzumab.    She is not a candidate to receive pertuzumab with her stage I disease.    Last TTE 07/21/15, stable. TTE 10/06/15, EF 62%. 6 month fu due around October.    Mammogram in March 2017 negative at [facility] breast center.       herceptin #10 today. We will see her back in 3 weeks for #11.     2. Emotional well being: She has excellent support and is coping well with her disease.    3. FH of breast cancer: BRCA 1/2 Ambry testing negative    4. Rash: resolved; saw Dr. Ronney Lion, dermatology.       5. Osteoporosis: On dexa from 2015; tried fosamax from Dr. Wanda Plump, stopped due to rash. DEXA due 02/2016.     6. Back pain/radiculopathy: ongoing, but had similar pain a few years ago which was relieved with an injection and a knee replacement. Dr. Dema Severin, pain specialist, was following her. NM bone scan and xr show degeneration. Currently taking tylenol with some relief.     Thank you for this consult.  All of the patient's questions were answered today.    Follow-up Disposition:  Return in about 3 weeks (around 11/29/2015) for fu, sand/Georgio Hattabaugh, her 11.    Sonda Rumble, MD

## 2015-11-08 NOTE — Progress Notes (Signed)
Jillian Bean is a 75 y.o. female here for follow-up of breast cancer.

## 2015-11-24 MED ORDER — SODIUM CHLORIDE 0.9 % IV
INTRAVENOUS | Status: AC
Start: 2015-11-24 — End: 2015-11-29
  Administered 2015-11-29: 15:00:00 via INTRAVENOUS

## 2015-11-24 MED ORDER — SODIUM CHLORIDE 0.9 % IV
440 mg | Freq: Once | INTRAVENOUS | Status: AC
Start: 2015-11-24 — End: 2015-11-29
  Administered 2015-11-29: 15:00:00 via INTRAVENOUS

## 2015-11-24 MED FILL — SODIUM CHLORIDE 0.9 % IV: INTRAVENOUS | Qty: 1000

## 2015-11-29 ENCOUNTER — Ambulatory Visit: Admit: 2015-11-29 | Discharge: 2015-11-29 | Payer: MEDICARE | Attending: Nurse Practitioner | Primary: Internal Medicine

## 2015-11-29 ENCOUNTER — Inpatient Hospital Stay: Admit: 2015-11-29 | Payer: MEDICARE | Primary: Internal Medicine

## 2015-11-29 DIAGNOSIS — Z5111 Encounter for antineoplastic chemotherapy: Secondary | ICD-10-CM

## 2015-11-29 DIAGNOSIS — C50911 Malignant neoplasm of unspecified site of right female breast: Secondary | ICD-10-CM

## 2015-11-29 MED ORDER — SODIUM CHLORIDE 0.9 % IJ SYRG
INTRAMUSCULAR | Status: AC | PRN
Start: 2015-11-29 — End: 2015-11-29
  Administered 2015-11-29: 16:00:00 via INTRAVENOUS

## 2015-11-29 MED ORDER — HEPARIN, PORCINE (PF) 100 UNIT/ML IV SYRINGE
100 unit/mL | INTRAVENOUS | Status: AC | PRN
Start: 2015-11-29 — End: 2015-11-30
  Administered 2015-11-29: 16:00:00 via INTRAVENOUS

## 2015-11-29 MED ORDER — SODIUM CHLORIDE 0.9 % INJECTION
INTRAMUSCULAR | Status: AC | PRN
Start: 2015-11-29 — End: 2015-11-30
  Administered 2015-11-29: 13:00:00 via INTRAVENOUS

## 2015-11-29 MED FILL — HERCEPTIN 440 MG INTRAVENOUS SOLUTION: 440 mg | INTRAVENOUS | Qty: 405

## 2015-11-29 NOTE — Progress Notes (Signed)
[facility]  [place], [place]   [address]  W: [phone]   F: [phone]      F/u HEME/ONC CONSULT    Reason for visit: evaluation for treatment for breast cancer    Consulting physician: Dr. Gilford Rile    HPI:   Jillian Bean is a 75 y.o.  female who I was asked to see in consultation at the request of Dr. Elie Confer for evaluation for systemic therapy for breast cancer.    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 Right lumpectomy on 11/10/14 shows IDC, 1.7 cm, gr 3, 0/5 LN, no LVI.  PT1cN0Mx.    TCH q 3 weeks x 6: 12/21/14- 04/12/15  C#5 held 1 week on 03/15/15 due to thrombocytopenia   Outback Herceptin: 05/03/15-11/29/15    S/p XRT 06/03/15    Interval History: In today for Herceptin #11. Complains of lower back pain, 5/10, gr 1 neuropathy, gr 1 swelling.     DX   Encounter Diagnoses   Name Primary?   ??? Malignant neoplasm of right female breast, unspecified site of breast ([place]) Yes   ??? Age-related osteoporosis without current pathological fracture    ??? Chronic right-sided low back pain with right-sided sciatica      Past Medical History:   Diagnosis Date   ??? Back pain 09/01/2007   ??? Cancer (HCC)     R breast   ??? Coagulation disorder ([place])     IRON DEFFICIENCY ANEMIA   ??? Colonic polyps 08/31/1998   ??? 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 Hiatal hernia 07/11/2011    Dr.sobieski   ??? Hypercholesteremia 09/01/2007   ??? Hypothyroidism 09/01/2003   ??? 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 Nausea & vomiting    ??? OA (osteoarthritis) 09/01/2007   ??? Other ill-defined conditions     VERTIGO   ??? Plantar fasciitis 09/01/2007   ??? S/P colonoscopy 10/10/2007   ??? Thyroid disease      Past Surgical History:   Procedure Laterality Date    ??? BREAST SURGERY PROCEDURE UNLISTED  1993    Breast Reduction   ??? ENDOSCOPY, COLON, DIAGNOSTIC  7/12/18/08    7/05 Dr Maudry Diego   ??? HX GI      COLONOSCOPY   ??? HX GYN      Hysterectomy in her 41's   ??? HX HEENT  04/07/10    thyroid biopsy neg dr Leward Quan   ??? HX HEENT      wisdom teeth extraction   ??? HX ORTHOPAEDIC  08/22/09    left TKR   ??? HX ORTHOPAEDIC  08/13/12    right tkr   ??? HX OTHER SURGICAL      MINIMALLY INVASIVE LUMBAR DECOMPRESSION     Social History     Social History   ??? Marital status: MARRIED     Spouse name: N/A   ??? Number of children: N/A   ??? Years of education: N/A     Social History Main Topics   ??? Smoking status: Never Smoker   ??? Smokeless tobacco: Never Used   ??? Alcohol use No   ??? Drug use: No   ??? Sexual activity: Not Currently  Other Topics Concern   ??? None     Social History Narrative     Family History   Problem Relation Age of Onset   ??? Cancer Mother      Breast   ??? Heart Failure Father      MI   ??? Seizures Brother      ms       Current Outpatient Prescriptions   Medication Sig Dispense Refill   ??? colesevelam (WELCHOL) 625 mg tablet 1 po bid 180 Tab 2   ??? levothyroxine (SYNTHROID) 112 mcg tablet 8 per week 100 Tab 3   ??? docusate sodium (COLACE) 100 mg capsule Take 100 mg by mouth daily.     ??? 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 latanoprost (XALATAN) 0.005 % ophthalmic solution Administer  to both eyes nightly.  6   ??? aspirin 81 mg chewable tablet Take 81 mg by mouth daily.     ??? multivitamin (ONE A DAY) tablet Take 1 Tab by mouth daily.     ??? cholecalciferol, vitamin d3, (VITAMIN D) 1,000 unit tablet Take  by mouth daily.     ??? acetaminophen (TYLENOL) 500 mg tablet Take  by mouth every six (6) hours as needed for Pain.     ??? cyclobenzaprine (FLEXERIL) 10 mg tablet Take 1 Tab by mouth nightly as needed for Muscle Spasm(s). 30 Tab 0   ??? lidocaine-prilocaine (EMLA) topical cream Apply  to affected area as needed for Pain. 30 g 0      Facility-Administered Medications Ordered in Other Visits   Medication Dose Route Frequency Provider Last Rate Last Dose   ??? sodium chloride 0.9 % injection 10 mL  10 mL IntraVENous PRN Annie Paras, MD   10 mL at 11/29/15 8119   ??? heparin (porcine) pf 500 Units  500 Units IntraVENous PRN Annie Paras, MD       ??? sodium chloride (NS) flush 10-40 mL  10-40 mL IntraVENous PRN Annie Paras, MD       ??? 0.9% sodium chloride infusion  25 mL/hr IntraVENous CONTINUOUS Joyce Gross, MD       ??? trastuzumab (HERCEPTIN) 405 mg in 0.9% sodium chloride 250 mL, overfill volume 25 mL IVPB  405 mg IntraVENous ONCE Joyce Gross, MD         Allergies   Allergen Reactions   ??? Zocor [Simvastatin] Other (comments) and Diarrhea     Other reaction(s): Adverse reaction to substance   Myalgias     ??? Celebrex [Celecoxib] Other (comments)     Aggitation   ??? Gabapentin Other (comments)     FELT UNSTEADY ON MY FEET   ??? Levaquin [Levofloxacin] Palpitations   ??? Nexium [Esomeprazole Magnesium] Diarrhea   ??? Oxycontin [Oxycodone] Itching   ??? Pravastatin Nausea and Vomiting   ??? Yellow Dye Hives       Review of Systems    A comprehensive review of systems was performed and all systems were negative except for HPI and for the symptom report form, reviewed and scanned in.    Objective:  Physical Exam:  Visit Vitals   ??? BP 115/65   ??? Pulse 71   ??? Temp 97 ??F (36.1 ??C) (Temporal)   ??? Resp 18   ??? Ht '5\' 5"'  (1.651 m)   ??? Wt 145 lb 8 oz (66 kg)   ??? LMP 04/13/2010   ??? SpO2 99%   ???  BMI 24.21 kg/m2       General:  Alert, cooperative, no distress, appears stated age.   Head:  Normocephalic, without obvious abnormality, atraumatic.   Eyes:  Conjunctivae/corneas clear. PERRL, EOMs intact.   Throat: Lips, mucosa, and tongue normal.    Neck: Supple, symmetrical, trachea midline, no adenopathy, thyroid: no enlargement/tenderness/nodules   Back:   Symmetric, no curvature. ROM normal. No CVA tenderness.   Lungs:   Clear to auscultation bilaterally.    Chest wall:  No tenderness or deformity.   Heart:  Regular rate and rhythm, S1, S2 normal, no murmur, click, rub or gallop.   Breast Exam:  S/p R lumpectomy.    Abdomen:   Soft, non-tender. Bowel sounds normal. No masses,  No organomegaly.   Extremities: Extremities normal, atraumatic, no cyanosis or edema.   Skin: Improved macular rash on elbows, back, abdomen lower, upper thighs, back of legs   Lymph nodes: Cervical, supraclavicular, and axillary nodes normal.   Neurologic: CNII-XII intact.     Diagnostic Imaging     12/16/14 TTE: EF 71%.  03/02/15 TTE: EF 68%  05/30/15 TTE :  EF 65%  07/21/15 TTE: EF 66%  10/06/15 TTE: EF 62%    Lab Results  Lab Results   Component Value Date/Time    WBC 6.2 06/21/2015 10:41 AM    HGB 11.5 06/21/2015 10:41 AM    HCT 34.8 06/21/2015 10:41 AM    PLATELET 172 06/21/2015 10:41 AM    MCV 95 06/21/2015 10:41 AM     Lab Results   Component Value Date/Time    Sodium 144 06/21/2015 10:41 AM    Potassium 4.1 06/21/2015 10:41 AM    Chloride 102 06/21/2015 10:41 AM    CO2 25 06/21/2015 10:41 AM    Anion gap 7 02/22/2015 09:56 AM    Glucose 84 06/21/2015 10:41 AM    BUN 24 06/21/2015 10:41 AM    Creatinine 0.71 06/21/2015 10:41 AM    BUN/Creatinine ratio 34 06/21/2015 10:41 AM    GFR est AA 96 06/21/2015 10:41 AM    GFR est non-AA 84 06/21/2015 10:41 AM    Calcium 8.9 06/21/2015 10:41 AM    AST (SGOT) 22 06/21/2015 10:41 AM    Alk. phosphatase 78 06/21/2015 10:41 AM    Protein, total 6.6 06/21/2015 10:41 AM    Albumin 4.5 06/21/2015 10:41 AM    Globulin 2.5 04/12/2015 09:23 AM    A-G Ratio 2.1 06/21/2015 10:41 AM    ALT (SGPT) 23 06/21/2015 10:41 AM     Assessment/Plan:  75 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  PS 0    1. Right Breast cancer stage: IA    Hormonal therapy: not indicated due to receptor (-) status    We explained to the patient that the goal of systemic adjuvant therapy is  to improve the chances for cure and decrease the risk of relapse. We explained why a patient can have microscopic cancer spread now even though physical examination, laboratory studies and imaging studies are negative for cancer. We explained that the same treatments used now as adjuvant or preventive treatments rarely if ever are curative in women who develop metastases.     She is in excellent health and thus an adjuvant discussion is warranted.  trastuzumab based therapy corresponds to a 9% overall survival benefit, which is rather larger overall (that corresponds to a nearly 20% DFS  benefit).     Rationale for therapy with trastuzumab was also discussed with the patient including a 50% proportional improvement in disease free survival and also an improvement in overall survival in patients receiving trastuzumab and chemotherapy for HER-2 positive breast cancer.?? The side effects of trastuzumab were discussed including a 4%-5% risk of dropping her ejection fraction while on treatment and about a 1% risk of CHF.?? We discussed that this drug will be used every 3 weeks for remainder of a year following the chemotherapy cycles.?? We will check her EF before chemotherapy and every 3 months while she is receiving trastuzumab.    She is not a candidate to receive pertuzumab with her stage I disease.    Last TTE 07/21/15, stable. TTE 10/06/15, EF 62%. 6 month fu due around October.    Mammogram in March 2017 negative at [facility] breast center.       herceptin #11 today. Ok for port to come out at her convenience.     Follow-up after early breast cancer was discussed. I recommend follow-up as defined by the American Society of Clinical Oncology and [employer] . This includes a visit to a health care professional every 3-6 months for 3 years, then every 6-12 months for 2 years, and then yearly as well as mammograms yearly.  ASCO handout provided.     We will see her back in 3 months. To see Dr. Elie Confer today and Dr. Gilford Rile in September.     2. Emotional well being: She has excellent support and is coping well with her disease.    3. FH of breast cancer: BRCA 1/2 Ambry testing negative    4. Rash: resolved; saw Dr. Ronney Lion, dermatology.      5. Osteoporosis: On dexa from 2015; tried fosamax from Dr. Wanda Plump, stopped due to rash. DEXA due 02/2016, ordered.    6. Back pain/radiculopathy: ongoing, but had similar pain a few years ago which was relieved with an injection and a knee replacement. Dr. Dema Severin, pain specialist, was following her. NM bone scan and xr show degeneration. Currently taking tylenol with some relief.     Thank you for this consult.  All of the patient's questions were answered today.    Follow-up Disposition:  Return for 3-65mfu, Denishia Citro.    WSonda Rumble MD

## 2015-11-29 NOTE — Other (Signed)
[facility] VISIT NOTE    Pt arrived at [facility] ambulatory and in no distress for C 11 Herceptin.  Assessment completed, pt c/o usual pain in right leg and back due to sciatica.  Patient Vitals for the past 12 hrs:   Temp Pulse Resp BP SpO2   11/29/15 1151 96.6 ??F (35.9 ??C) 65 16 132/56 -   11/29/15 0905 97 ??F (36.1 ??C) 71 16 115/65 99 %     Leftchest port accessed with .75   in huber no difficulty. Positive blood return noted.     Medications received:  Herceptin IV    Tolerated treatment well, no adverse reaction noted. Port de-accessed and flushed per protocol. Positive blood return noted.    D/C'd from [facility] ambulatory and in no distress . This was patient's last administration.

## 2015-11-29 NOTE — Progress Notes (Signed)
Problem: Chemotherapy Treatment  Goal: *Chemotherapy regimen followed  Outcome: Progressing Towards Goal  Pt here for C11 Herceptin

## 2015-11-29 NOTE — Progress Notes (Signed)
Oncology Navigator  Psychosocial Assessment    Reason for Assessment:    Depression  Anxiety  Caregiver Burden  Maladaptive Coping with Serious Illness   Other: ongoing support    Sources of Information:    Patient  Family  Staff  Medical Record    Advance Care Planning:  Pt has medical directive on file. This was reviewed with pt today. No changes to be made at this time.     Mental Status:    Alert  Lethargic  Unresponsive  Oriented to:  Person  Place  Time  Situation      Barriers to Learning:    Language  Developmental  Cognitive  Altered Mental Status  Visual/Hearing Impairment  Unable to Read/Write  Motivational   No Barriers Identified  Other:    Relationship Status:  Single  Married  Significant Other/Life Partner  Divorced  Separated  Widowed      Living Circumstances:  Lives Alone  Family/Significant Other in [place] in the Home  Paid Caregivers  Assisted Living Facility/Group [facility]  Homeless  Incarcerated  Environmental/Care Concerns  Other:    Support System:    Strong  Fair  Limited    Financial/Legal Concerns:    Uninsured  Limited Income/Resources  Non-Citizen  No Concerns Identified  Financial POA:    Other:    Religious/Spiritual/Existential:  Strong Sense of Spirituality  Involved in [place] Visit  Expressing Spiritual/Existential Angst  No Concerns Identified    Coping with Illness:         Patient: Family/Caregiver:   Understanding and Acceptance of Illness/Prognosis      Strong Sense of Resilience     Self Reflection     Engaged Support System     Does not Readily Discuss Illness     Denial of Terminal Status     Anger     Depression     Anxiety/Fear     Bargaining     Recent Diagnosis/Prognosis     Difficulties with Body Image     Loss of Identity     Excessive Substance Use     Mental Health History     Enmeshed Relationships     History of Loss     Anticipatory Grief     Concern for Complicated Grief      Suicidal Ideation or Plan     Unable to assess                      Narrative:  Met with pt to provide ongoing support and to review medical directives. Pt shared she continues to actively cope with her diagnosis and treatment needs. She reports family remains supportive and engaged in her care as needed. Barriers to care reviewed and none were identified at this visit. Resources provided at pt request. Encouraged pt to call with any questions, concerns.     Referrals:     I.  Transportation    Medicaid Web designer)    ACS Road [employer]  Financial Assistance/Medication Access    Patient assistance program     Co-pay assistance                                      Leukemia & Lymphoma Society    Patient [place]    CancerCare       Emotional support    Peer support group    Local counseling                                     Online support group    Coordination of psychiatry consult      Goals/Plan:   1. Ongoing support

## 2015-11-29 NOTE — Progress Notes (Signed)
Jillian Bean is a 75 y.o. female here for follow-up of breast cancer.

## 2015-11-29 NOTE — Patient Instructions (Addendum)
CONGRATULATIONS!!!    Come see [name] in 3-4 months.

## 2015-12-15 NOTE — Telephone Encounter (Signed)
Patient left a voicemail and stated that she received an email saying that she has an echo scheduled for may 11th but she was under the impression that our office would order an echo at her next visit on 7/31. Patient stated she would like to know where this one came from and if she needs to go. CB# [phone]

## 2015-12-16 NOTE — Telephone Encounter (Signed)
Called the patient and verified ID x 2.  Informed the patient that she is correct, per Abel Presto, NP she is not due for an ECHO until August/September and that it will be discussed and ordered at her next appointment on 03/12/16.  The patient verbalized understanding and denied any further questions or concerns.

## 2015-12-22 ENCOUNTER — Encounter: Primary: Internal Medicine

## 2015-12-23 ENCOUNTER — Encounter

## 2015-12-23 ENCOUNTER — Inpatient Hospital Stay: Admit: 2015-12-23 | Payer: MEDICARE | Primary: Internal Medicine

## 2015-12-23 DIAGNOSIS — M5417 Radiculopathy, lumbosacral region: Secondary | ICD-10-CM

## 2016-01-11 ENCOUNTER — Encounter

## 2016-01-19 ENCOUNTER — Inpatient Hospital Stay: Admit: 2016-01-19 | Payer: MEDICARE | Attending: Physical Medicine & Rehabilitation | Primary: Internal Medicine

## 2016-01-19 DIAGNOSIS — M5136 Other intervertebral disc degeneration, lumbar region: Secondary | ICD-10-CM

## 2016-01-27 ENCOUNTER — Encounter

## 2016-01-27 ENCOUNTER — Inpatient Hospital Stay: Admit: 2016-01-27 | Payer: MEDICARE | Primary: Internal Medicine

## 2016-01-27 DIAGNOSIS — Z01818 Encounter for other preprocedural examination: Secondary | ICD-10-CM

## 2016-01-27 LAB — EKG, 12 LEAD, INITIAL
Atrial Rate: 66 {beats}/min
Calculated P Axis: 62 degrees
Calculated R Axis: 8 degrees
Calculated T Axis: 47 degrees
Diagnosis: NORMAL
P-R Interval: 180 ms
Q-T Interval: 414 ms
QRS Duration: 102 ms
QTC Calculation (Bezet): 434 ms
Ventricular Rate: 66 {beats}/min

## 2016-01-27 LAB — EKG 12-LEAD
Atrial Rate: 66 {beats}/min
Diagnosis: NORMAL
P Axis: 62 degrees
P-R Interval: 180 ms
Q-T Interval: 414 ms
QRS Duration: 102 ms
QTc Calculation (Bazett): 434 ms
R Axis: 8 degrees
T Axis: 47 degrees
Ventricular Rate: 66 {beats}/min

## 2016-01-30 NOTE — Telephone Encounter (Signed)
Records faxed to [facility] @ fax # [phone].

## 2016-01-30 NOTE — Telephone Encounter (Signed)
Please fax recent records to [facility] with [facility]. Fax number [phone]. Not a part of cc and call center fax machine broken. Thanks!

## 2016-02-16 ENCOUNTER — Inpatient Hospital Stay: Admit: 2016-03-05 | Payer: MEDICARE | Primary: Internal Medicine

## 2016-02-16 ENCOUNTER — Ambulatory Visit: Admit: 2016-02-16 | Discharge: 2016-02-16 | Payer: MEDICARE | Attending: Internal Medicine | Primary: Internal Medicine

## 2016-02-16 DIAGNOSIS — E039 Hypothyroidism, unspecified: Secondary | ICD-10-CM

## 2016-02-16 MED ORDER — LEVOTHYROXINE 112 MCG TAB
112 mcg | ORAL_TABLET | ORAL | 3 refills | Status: DC
Start: 2016-02-16 — End: 2017-03-05

## 2016-02-16 NOTE — Progress Notes (Signed)
Chief Complaint   Patient presents with   ??? Medication Evaluation   ??? Labs       1. Have you been to the ER, urgent care clinic since your last visit?  Hospitalized since your last visit?No    2. Have you seen or consulted any other health care providers outside of the [facility] since your last visit?  Include any pap smears or colon screening. Yes, Dr. Quita Skye - removed port, surgeon      There are no preventive care reminders to display for this patient.      Living will on file.

## 2016-02-16 NOTE — Progress Notes (Signed)
HISTORY OF PRESENT ILLNESS  Jillian Bean is a 75 y.o. female.  HPI  Follow up.  Issues:   1. Hypothyroidism on Synthroid 100 mcg, eight tablets weekly.  No symptoms of hypo or hyperthyroidism.  Does use a stool softener.  Bowels are fairly regular and no changes in temperature, skin or hair.  Energy is improving as she gets further out from her chemo.  2. Breast cancer.  She has just finished up her Herceptin.  She is still seeing the surgeon every three months, and Dr. Geralyn Bean.  She has a bone density scheduled.  She feels well.   3. Dyslipidemia, on Welchol, could not tolerate statin.  Due for labs.  Is not having cardiac issues.      Review of Systems   Constitutional: Negative for chills, fever, malaise/fatigue and weight loss.   Respiratory: Negative for cough, shortness of breath and wheezing.    Cardiovascular: Negative for chest pain, palpitations, orthopnea, leg swelling and PND.   Gastrointestinal: Negative for abdominal pain, constipation, heartburn and nausea.   Musculoskeletal: Negative for myalgias.   Neurological: Negative for dizziness and headaches.   Psychiatric/Behavioral: Negative for depression.       Physical Exam   Constitutional: She is oriented to person, place, and time. She appears well-developed and well-nourished.   HENT:   Head: Normocephalic and atraumatic.   Neck: Normal range of motion. Neck supple. Carotid bruit is not present. No thyromegaly present.   Cardiovascular: Normal rate, regular rhythm, S1 normal, S2 normal, normal heart sounds and intact distal pulses.    No murmur heard.  Pulmonary/Chest: Effort normal and breath sounds normal. No respiratory distress. She has no wheezes. She has no rales.   Musculoskeletal: She exhibits no edema.   Neurological: She is alert and oriented to person, place, and time.   Psychiatric: She has a normal mood and affect. Her behavior is normal.   Nursing note and vitals reviewed.      ASSESSMENT and PLAN   Jillian Bean was seen today for medication evaluation and labs.    Diagnoses and all orders for this visit:    Acquired hypothyroidism  -     TSH 3RD GENERATION  -     T4, FREE  -     levothyroxine (SYNTHROID) 112 mcg tablet; 8 per week    Dyslipidemia-cont welchol  -     METABOLIC PANEL, COMPREHENSIVE  -     LIPID PANEL    Malignant neoplasm of right female breast, unspecified site of breast ([place])    Doing well now sp chemo and herceptin and port out and feels well  lab results and schedule of future lab studies reviewed with patient

## 2016-02-17 LAB — METABOLIC PANEL, COMPREHENSIVE
A-G Ratio: 1.6 (ref 1.2–2.2)
ALT (SGPT): 12 IU/L (ref 0–32)
AST (SGOT): 16 IU/L (ref 0–40)
Albumin: 4.2 g/dL (ref 3.5–4.8)
Alk. phosphatase: 96 IU/L (ref 39–117)
BUN/Creatinine ratio: 16 (ref 12–28)
BUN: 12 mg/dL (ref 8–27)
Bilirubin, total: 0.2 mg/dL (ref 0.0–1.2)
CO2: 26 mmol/L (ref 18–29)
Calcium: 9.3 mg/dL (ref 8.7–10.3)
Chloride: 101 mmol/L (ref 96–106)
Creatinine: 0.75 mg/dL (ref 0.57–1.00)
GFR est AA: 90 mL/min/{1.73_m2} (ref >59)
GFR est non-AA: 78 mL/min/{1.73_m2} (ref >59)
GLOBULIN, TOTAL: 2.7 g/dL (ref 1.5–4.5)
Glucose: 92 mg/dL (ref 65–99)
Potassium: 4.3 mmol/L (ref 3.5–5.2)
Protein, total: 6.9 g/dL (ref 6.0–8.5)
Sodium: 142 mmol/L (ref 134–144)

## 2016-02-17 LAB — LIPID PANEL
Cholesterol, total: 178 mg/dL (ref 100–199)
HDL Cholesterol: 53 mg/dL (ref >39)
LDL, calculated: 109 mg/dL — ABNORMAL HIGH (ref 0–99)
Triglyceride: 81 mg/dL (ref 0–149)
VLDL, calculated: 16 mg/dL (ref 5–40)

## 2016-02-17 LAB — T4, FREE: T4, Free: 1.92 ng/dL — ABNORMAL HIGH (ref 0.82–1.77)

## 2016-02-17 LAB — TSH 3RD GENERATION: TSH: 1.52 u[IU]/mL (ref 0.450–4.500)

## 2016-02-17 LAB — CVD REPORT

## 2016-02-17 NOTE — Progress Notes (Signed)
Message sent about labs

## 2016-03-12 ENCOUNTER — Ambulatory Visit: Admit: 2016-03-12 | Discharge: 2016-03-12 | Payer: MEDICARE | Attending: Specialist | Primary: Internal Medicine

## 2016-03-12 DIAGNOSIS — C50911 Malignant neoplasm of unspecified site of right female breast: Secondary | ICD-10-CM

## 2016-03-12 NOTE — Progress Notes (Signed)
Chief Complaint   Patient presents with   ??? Colon Polyps   ??? Colitis

## 2016-03-12 NOTE — Progress Notes (Signed)
[facility]  [address], [place]   [address]  W: [phone]   F: [phone]      F/u HEME/ONC CONSULT    Reason for visit: evaluation for treatment for breast cancer    Consulting physician: Dr. Gilford Rile    HPI:   Jillian Bean is a 75 y.o.  female who I was asked to see in consultation at the request of Dr. Elie Confer for evaluation for systemic therapy for breast cancer.    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 Right lumpectomy on 11/10/14 shows IDC, 1.7 cm, gr 3, 0/5 LN, no LVI.  PT1cN0Mx.    TCH q 3 weeks x 6: 12/21/14- 04/12/15  C#5 held 1 week on 03/15/15 due to thrombocytopenia   [place] Herceptin: 05/03/15-11/29/15    S/p XRT 06/03/15    Interval History: complains of 5/10 right knee pain    DX   Encounter Diagnoses   Name Primary?   ??? Malignant neoplasm of right female breast, unspecified site of breast ([place]) Yes   ??? Age-related osteoporosis without current pathological fracture    ??? Chronic right-sided low back pain with right-sided sciatica      Past Medical History:   Diagnosis Date   ??? Back pain 09/01/2007   ??? Cancer (HCC)     R breast   ??? Coagulation disorder ([employer])     IRON DEFFICIENCY ANEMIA   ??? Colonic polyps 08/31/1998   ??? 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 Hiatal hernia 07/11/2011    Dr.sobieski   ??? Hypercholesteremia 09/01/2007   ??? Hypothyroidism 09/01/2003   ??? 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 Nausea & vomiting    ??? OA (osteoarthritis) 09/01/2007   ??? Other ill-defined conditions     VERTIGO   ??? Plantar fasciitis 09/01/2007   ??? S/P colonoscopy 10/10/2007   ??? Thyroid disease      Past Surgical History:   Procedure Laterality Date   ??? BREAST SURGERY PROCEDURE UNLISTED  1993    Breast Reduction    ??? ENDOSCOPY, COLON, DIAGNOSTIC  7/12/18/08    7/05 Dr Maudry Diego   ??? HX GI      COLONOSCOPY   ??? HX GYN      Hysterectomy in her 21's   ??? HX HEENT  04/07/10    thyroid biopsy neg dr Leward Quan   ??? HX HEENT      wisdom teeth extraction   ??? HX ORTHOPAEDIC  08/22/09    left TKR   ??? HX ORTHOPAEDIC  08/13/12    right tkr   ??? HX OTHER SURGICAL      MINIMALLY INVASIVE LUMBAR DECOMPRESSION     Social History     Social History   ??? Marital status: MARRIED     Spouse name: N/A   ??? Number of children: N/A   ??? Years of education: N/A     Social History Main Topics   ??? Smoking status: Never Smoker   ??? Smokeless tobacco: Never Used   ??? Alcohol use No   ??? Drug use: No   ??? Sexual activity: Not Currently     Other Topics Concern   ??? None  Social History Narrative     Family History   Problem Relation Age of Onset   ??? Cancer Mother      Breast   ??? Heart Failure Father      MI   ??? Seizures Brother      ms       Current Outpatient Prescriptions   Medication Sig Dispense Refill   ??? diclofenac EC (VOLTAREN) 50 mg EC tablet Take 1 Tab by mouth daily.  5   ??? biotin 10,000 mcg cap Take  by mouth.     ??? levothyroxine (SYNTHROID) 112 mcg tablet 8 per week 100 Tab 3   ??? acetaminophen (TYLENOL) 500 mg tablet Take  by mouth every six (6) hours as needed for Pain.     ??? cyclobenzaprine (FLEXERIL) 10 mg tablet Take 1 Tab by mouth nightly as needed for Muscle Spasm(s). 30 Tab 0   ??? colesevelam (WELCHOL) 625 mg tablet 1 po bid 180 Tab 2   ??? docusate sodium (COLACE) 100 mg capsule Take 100 mg by mouth daily.     ??? 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 latanoprost (XALATAN) 0.005 % ophthalmic solution Administer  to both eyes nightly.  6   ??? lidocaine-prilocaine (EMLA) topical cream Apply  to affected area as needed for Pain. 30 g 0   ??? aspirin 81 mg chewable tablet Take 81 mg by mouth daily.     ??? multivitamin (ONE A DAY) tablet Take 1 Tab by mouth daily.     ??? cholecalciferol, vitamin d3, (VITAMIN D) 1,000 unit tablet Take  by  mouth daily.       Allergies   Allergen Reactions   ??? Atorvastatin Myalgia   ??? Zocor [Simvastatin] Other (comments) and Diarrhea     Other reaction(s): Adverse reaction to substance   Myalgias     ??? Celebrex [Celecoxib] Other (comments)     Aggitation   ??? Gabapentin Other (comments)     FELT UNSTEADY ON MY FEET   ??? Levaquin [Levofloxacin] Palpitations   ??? Nexium [Esomeprazole Magnesium] Diarrhea   ??? Oxycontin [Oxycodone] Itching   ??? Pravastatin Nausea and Vomiting   ??? Yellow Dye Hives       Review of Systems    A comprehensive review of systems was performed and all systems were negative except for HPI and for the symptom report form, reviewed and scanned in.    Objective:  Physical Exam:  Visit Vitals   ??? BP 111/57   ??? Pulse 69   ??? Temp 96 ??F (35.6 ??C) (Oral)   ??? Resp 14   ??? Ht '5\' 5"'  (1.651 m)   ??? Wt 141 lb 3.2 oz (64 kg)   ??? LMP 04/13/2010   ??? SpO2 100%   ??? BMI 23.5 kg/m2       General:  Alert, cooperative, no distress, appears stated age.   Head:  Normocephalic, without obvious abnormality, atraumatic.   Eyes:  Conjunctivae/corneas clear. PERRL, EOMs intact.   Throat: Lips, mucosa, and tongue normal.    Neck: Supple, symmetrical, trachea midline, no adenopathy, thyroid: no enlargement/tenderness/nodules   Back:   Symmetric, no curvature. ROM normal. No CVA tenderness.   Lungs:   Clear to auscultation bilaterally.   Chest wall:  No tenderness or deformity.   Heart:  Regular rate and rhythm, S1, S2 normal, no murmur, click, rub or gallop.   Breast Exam:  S/p R lumpectomy.  Abdomen:   Soft, non-tender. Bowel sounds normal. No masses,  No organomegaly.   Extremities: Extremities normal, atraumatic, no cyanosis or edema.   Skin: Improved macular rash on elbows, back, abdomen lower, upper thighs, back of legs   Lymph nodes: Cervical, supraclavicular, and axillary nodes normal.   Neurologic: CNII-XII intact.     Diagnostic Imaging     12/16/14 TTE: EF 71%.  03/02/15 TTE: EF 68%  05/30/15 TTE :  EF 65%   07/21/15 TTE: EF 66%  10/06/15 TTE: EF 62%    Lab Results  Lab Results   Component Value Date/Time    WBC 6.2 06/21/2015 10:41 AM    HGB 11.5 06/21/2015 10:41 AM    HCT 34.8 06/21/2015 10:41 AM    PLATELET 172 06/21/2015 10:41 AM    MCV 95 06/21/2015 10:41 AM     Lab Results   Component Value Date/Time    Sodium 142 02/16/2016 09:32 AM    Potassium 4.3 02/16/2016 09:32 AM    Chloride 101 02/16/2016 09:32 AM    CO2 26 02/16/2016 09:32 AM    Anion gap 7 02/22/2015 09:56 AM    Glucose 92 02/16/2016 09:32 AM    BUN 12 02/16/2016 09:32 AM    Creatinine 0.75 02/16/2016 09:32 AM    BUN/Creatinine ratio 16 02/16/2016 09:32 AM    GFR est AA 90 02/16/2016 09:32 AM    GFR est non-AA 78 02/16/2016 09:32 AM    Calcium 9.3 02/16/2016 09:32 AM    AST (SGOT) 16 02/16/2016 09:32 AM    Alk. phosphatase 96 02/16/2016 09:32 AM    Protein, total 6.9 02/16/2016 09:32 AM    Albumin 4.2 02/16/2016 09:32 AM    Globulin 2.5 04/12/2015 09:23 AM    A-G Ratio 1.6 02/16/2016 09:32 AM    ALT (SGPT) 12 02/16/2016 09:32 AM     Assessment/Plan:  75 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  PS 0    1. Right Breast cancer stage: IA    Hormonal therapy: not indicated due to receptor (-) status    We explained to the patient that the goal of systemic adjuvant therapy is to improve the chances for cure and decrease the risk of relapse. We explained why a patient can have microscopic cancer spread now even though physical examination, laboratory studies and imaging studies are negative for cancer. We explained that the same treatments used now as adjuvant or preventive treatments rarely if ever are curative in women who develop metastases.     She is in excellent health and thus an adjuvant discussion is warranted.  trastuzumab based therapy corresponds to a 9% overall survival benefit, which is rather larger overall (that corresponds to a nearly 20% DFS benefit).      Last TTE 07/21/15, stable. TTE 10/06/15, EF 62%. 6 month fu due around October.    Mammogram in March 2017 negative at [facility] breast center.       To see Dr. Gilford Rile in September.     Discussed neratinib 240 mg daily with food for one year after the completion of herceptin. Discussed that there was only a 2% DFS benefit and that benefit was largely driven in the ER + population.  ??  Discussed common side effects of neratinib including but not limited to diarrhea, nausea, abdominal pain, fatigue, vomiting, rash, swollen and sore mouth (stomatitis), decreased appetite, muscle spasms, indigestion (dyspepsia), liver damage (  AST or ALT enzyme increase), nail disorder, dry skin, abdominal swelling (distention), weight loss and urinary tract infection.    After this discussion, she declines to take neratinib at this time.  The ExteNET study was amended also for stage 2-3.    2. Emotional well being: She has excellent support and is coping well with her disease.    3. FH of breast cancer: BRCA 1/2 [place] testing negative    4. Osteoporosis: On dexa from 2015; tried fosamax from Dr. Wanda Plump, stopped due to rash. DEXA due 02/2016, ordered. On 03/15/16    5. Back pain/radiculopathy: ongoing, but had similar pain a few years ago which was relieved with an injection and a knee replacement. Dr. Dema Severin, pain specialist, was following her. NM bone scan and xr show degeneration. Currently taking tylenol with some relief. Steroid injection did help    Thank you for this consult.  All of the patient's questions were answered today.    Follow-up Disposition:  Return in about 7 months (around 10/10/2016).    Sonda Rumble, MD

## 2016-03-15 ENCOUNTER — Inpatient Hospital Stay: Admit: 2016-03-15 | Payer: MEDICARE | Attending: Nurse Practitioner | Primary: Internal Medicine

## 2016-03-15 DIAGNOSIS — Z13820 Encounter for screening for osteoporosis: Secondary | ICD-10-CM

## 2016-03-15 NOTE — Progress Notes (Signed)
Please let her know this looks good. Overall stable. Significant improvement in her low back. Thanks! NOT URGENT

## 2016-03-16 NOTE — Telephone Encounter (Signed)
From: Duane Lope  To: [name], MD  Sent: 03/15/2016 4:38 PM EDT  Subject:  Test Results Question    Are  you able to see the test results of my bone density test, which was done today. Dr. Laurena Bering order the test.     Thanks,  Jillian Bean

## 2016-03-19 ENCOUNTER — Ambulatory Visit: Attending: Family | Primary: Internal Medicine

## 2016-03-19 NOTE — Progress Notes (Signed)
[facility]  [place]  [address]    Breast Cancer Survivorship Care Plan    GENERAL INFORMATION    NAME/AGE/GENDER: Jillian Bean is a 75 y.o. female who was born on March 20, 1941.    PHONE: [phone]     Date: 03/19/2016    Referring Provider: Dr. Laurena Bering    Patient Care Team:  [name], MD as PCP - [facility] [phone]  Joyce Gross, MD as Physician (Hematology and Oncology) Medical Oncology at [facility] [phone]  Deanna Artis, MD as Surgeon (General Surgery) [facility] [phone]  Terri Piedra, MD as Physician (Radiation Oncology) [facility] [phone]   Angelina Pih, NP as Nurse Practitioner (Cancer Survivorship) [facility] [phone]    DIAGNOSIS    Cancer type/location/histologic subtype/receptor status: RIGHT breast, 1.7 cm, Grade III invasive ductal carcinoma, ER/PR-, HER2+     Date of diagnosis (year): 2016    TNM/Stage: pT1cpN0/Stage 1A    [place] or Other Recommended Related Tests        Name of test  When/How often Ordering Provider   Mammogram Once a year Dr. Elie Confer      Please continue to see your primary care provider for all general health care recommended for a woman your age, including cancer screening tests.    Possible late and long-term effects that someone with this type of cancer and treatment may experience:  cardiovascular effects and lymphedema    Schedule of Clinical Visits        Coordinating Provider  When/How often Contact information   Primary Care Provider As needed for non-cancer health care [phone]   Medical Oncologist Every 3 to 6 months for the first 3 years, every 6 to 12 months for years 4 and 5, and annually thereafter [phone]    Radiation Oncologist Rotate visits with medical oncologist [phone]   Surgeon Rotate visits with medical oncologist [phone]     CANCER TREATMENT SUMMARY     Surgery: Date and procedure/location/findings: 11/10/14 RIGHT lumpectomy with RIGHT axillary sentinel lymph node biopsy, margins clear, no lymphovascular invasion identified, 5 lymph nodes removed--none were positive for cancer    Radiation: Yes End date (year): 05/16/15 to 06/06/15   Body area treated/dosage: 4256 cGy to RIGHT breast    Systemic Therapy (chemotherapy, biologics, other): Yes: 12/21/14 to 04/12/15; completed one year of maintenance Trastuzumab (Herceptin) on 11/29/15    Regimen Drug Name End Date (Year)   [place] (Q3w x 6) Docetaxel (Taxotere) 12m/m2 2016    Carboplatin AUC 6 2016    Trastuzumab (Herceptin) 817mkg on Cycle 1, then 39m33mg; 39mg65m continued Q3w to complete 1 year of maintenance therapy 2017     Cycle #5 of TCH [place] 1 week on 03/15/15 due to thrombocytopenia     Persistent symptoms or side effects at completion of treatment: Yes (enter type(s)): 510 RIGHT knee pain    Clinical Trial: No     Date of other cancer and/or recurrence of primary cancer and subsequent treatment: none, according to documentation in [facility]    FAMILIAL CANCER RISK ASSESSMENT    Family history of cancer: per documentation in [facility] and Dr. DealDarrick Meigsice note--mother with breast cancer at age 41, 23ceased;  paternal aunt with breast cancer, maternal aunt with colon cancer    Genetic/hereditary risk factor(s) or predisposing conditions: family history of breast cancer  Genetic testing: Yes  Results: Results of [employer] Gene Sequence and Deletion/Duplication Analyses of BRCA 1/2 on 01/21/15 were negative: no clinically significant variants were detected    Genetic counseling: negative test results shared with you by clinical staff at your medical oncologist's office    CONTINUING TREATMENT     Need for Ongoing (Adjuvant) Treatment for Cancer: No, your breast cancer did not have estrogen or progesterone receptors present (it was ER/PR negative) and you would not have benefited from these treatments     DOING YOUR PART AS A CANCER SURVIVOR    Many survivors feel worried or anxious that the cancer will come back after treatment. While it often does not, it???s important to talk with your doctor about the possibility of the cancer returning. Most breast cancer recurrences are found by patients between visits. Tell your doctor if you notice any of the following symptoms, as they may be signs of a cancer recurrence:     ?? New lumps in the breast  ?? Bone pain  ?? Chest pain  ?? Abdominal pain  ?? Shortness of breath or difficulty breathing  ?? Persistent headaches  ?? Persistent coughing  ?? Rash on breast  ?? Nipple discharge (liquid coming from the nipple)    Or any other symptoms should be brought to the attention of your provider:   1. Anything that represents a brand new symptom   2. Anything that represents a persistent symptom   3. Anything you are worried about that might be related to the cancer coming back    Cancer survivors may experience issues with the areas listed below.  If you have any concerns in these or other areas, please speak with your survivorship nurse practitioner or a member of your physician team to find out how to get help with them.    Emotional and mental health, fatigue, weight changes, stopping smoking, physical functioning, insurance, financial advice/assistance, school/work issues, parenting, memory or concentration loss, fertility, sexual functioning, or any other survivorship concerns            General Guidelines for Health and Cancer Prevention/Risk Reduction  ????  ?? Work to achieve and maintain a healthy weight  ?? Engage in regular physical activity including:  Aerobic exercise at least 150 minutes per week ??  ????????????????????????????????????????????????Strength training exercise at least 2 days per  week ??  ?? Eat a diet that is high in vegetables, fruits, whole grains, legumes (beans) and low in saturated fats (animal fats) ??  ?? Quit/do not start using tobacco  ?? Limit alcohol consumption to no more than 1 drink per day for women/no more than 2 drinks per day for men    If you have any questions or need additional information/support, please discuss these recommendations with your doctor or nurse practitioner.????????????   RESOURCES FOR THE JOURNEY    Breast Cancer Specific:  Living Beyond Breast Cancer ClearanceMarkets.pl  Breast Cancer.org http://www.breastcancer.[place] http://ww5.komen.[place] www.vbcf.org  Facing Our Risk of Cancer Empowered Corporate investment banker) www.facingourrisk.com    General:  Livestrong www.livestrong.[place] www.cancer.[place] www.cancer.org  American Society of Clinical Oncology http://www.cancer.net/research-and-advocacy/asco-care-and-treatment-  recommendations-patients/follow-care-breast-cancer  [employer] for [employer] www.aicr.[employer] for [employer] www.canceradvocacy.[place] www.nccn.org  Patient Market researcher.patientadvocate.org  Cancer and Careers www.cancerandcareers.[place] Cancer Survivorship www.bonsecours.com/cancersurvivorship    Prepared By: Melford Aase FNP  [place]  [phone] or [phone]  [email or web address]    Delivered on: 03/19/16    This [place] is a cancer treatment summary and follow-up plan provided to you to keep with your healthcare records and to share with your healthcare providers.  This summary is a brief record of major aspects of your cancer treatment, not a detailed or comprehensive record of your care.

## 2016-03-19 NOTE — Progress Notes (Signed)
[facility]  [address]  [place]  [address]    Breast Cancer Survivorship Care Plan    GENERAL INFORMATION    NAME/AGE/GENDER: Jillian Bean is a 75 y.o. female who was born on 1941-03-27.    PHONE: [phone]     Date: 03/19/2016    Referring Provider: Dr. Laurena Bering    Patient Care Team:  [name], MD as PCP - [facility] [phone]  Joyce Gross, MD as Physician (Hematology and Oncology) Medical Oncology at [facility] [phone]  Deanna Artis, MD as Surgeon (General Surgery) [facility] [phone]  Terri Piedra, MD as Physician (Radiation Oncology) [facility] [phone]   Angelina Pih, NP as Nurse Practitioner (Cancer Survivorship) [facility] [phone]    DIAGNOSIS    Cancer type/location/histologic subtype/receptor status: RIGHT breast, 1.7 cm, Grade III invasive ductal carcinoma, ER/PR-, HER2+     Date of diagnosis (year): 2016    TNM/Stage: pT1cpN0/Stage 1A    [place] or Other Recommended Related Tests        Name of test  When/How often Ordering Provider   Mammogram Once a year Dr. Elie Confer      Please continue to see your primary care provider for all general health care recommended for a woman your age, including cancer screening tests.    Possible late and long-term effects that someone with this type of cancer and treatment may experience:  cardiovascular effects and lymphedema    Schedule of Clinical Visits        Coordinating Provider  When/How often Contact information   Primary Care Provider As needed for non-cancer health care [phone]   Medical Oncologist Every 3 to 6 months for the first 3 years, every 6 to 12 months for years 4 and 5, and annually thereafter [phone]    Radiation Oncologist Rotate visits with medical oncologist [phone]   Surgeon Rotate visits with medical oncologist [phone]     CANCER TREATMENT SUMMARY     Surgery: Date and procedure/location/findings: 11/10/14 RIGHT lumpectomy with RIGHT axillary sentinel lymph node biopsy, margins clear, no lymphovascular invasion identified, 5 lymph nodes removed--none were positive for cancer    Radiation: Yes End date (year): 05/16/15 to 06/06/15   Body area treated/dosage: 4256 cGy to RIGHT breast    Systemic Therapy (chemotherapy, biologics, other): Yes: 12/21/14 to 04/12/15; completed one year of maintenance Trastuzumab (Herceptin) on 11/29/15    Regimen Drug Name End Date (Year)   [place] (Q3w x 6) Docetaxel (Taxotere) 38m/m2 2016    Carboplatin AUC 6 2016    Trastuzumab (Herceptin) 81mkg on Cycle 1, then 85m103mg; 85mg57m continued Q3w to complete 1 year of maintenance therapy 2017     Cycle #5 of TCH [place] 1 week on 03/15/15 due to thrombocytopenia     Persistent symptoms or side effects at completion of treatment: Yes (enter type(s)): 510 RIGHT knee pain    Clinical Trial: No     Date of other cancer and/or recurrence of primary cancer and subsequent treatment: none, according to documentation in [facility] [place] [facility]    FAMILIAL CANCER RISK ASSESSMENT    Family history of cancer: per documentation in [facility] and Dr. DealDarrick Meigsice note--mother with breast cancer at age 50, 82ceased;  paternal aunt with breast cancer, maternal aunt with colon cancer    Genetic/hereditary risk factor(s) or predisposing conditions: family history of breast cancer  Genetic testing: Yes  Results: Results of [employer] Gene Sequence and Deletion/Duplication Analyses of BRCA 1/2 on 01/21/15 were negative: no clinically significant variants were detected    Genetic counseling: negative test results shared with you by clinical staff at your medical oncologist's office    CONTINUING TREATMENT     Need for Ongoing (Adjuvant) Treatment for Cancer: No, your breast cancer did not have estrogen or progesterone receptors present (it was ER/PR negative) and you would not have benefited from these treatments     DOING YOUR PART AS A CANCER SURVIVOR    Many survivors feel worried or anxious that the cancer will come back after treatment. While it often does not, it???s important to talk with your doctor about the possibility of the cancer returning. Most breast cancer recurrences are found by patients between visits. Tell your doctor if you notice any of the following symptoms, as they may be signs of a cancer recurrence:     ?? New lumps in the breast  ?? Bone pain  ?? Chest pain  ?? Abdominal pain  ?? Shortness of breath or difficulty breathing  ?? Persistent headaches  ?? Persistent coughing  ?? Rash on breast  ?? Nipple discharge (liquid coming from the nipple)    Or any other symptoms should be brought to the attention of your provider:   1. Anything that represents a brand new symptom   2. Anything that represents a persistent symptom   3. Anything you are worried about that might be related to the cancer coming back    Cancer survivors may experience issues with the areas listed below.  If you have any concerns in these or other areas, please speak with your survivorship nurse practitioner or a member of your physician team to find out how to get help with them.    Emotional and mental health, fatigue, weight changes, stopping smoking, physical functioning, insurance, financial advice/assistance, school/work issues, parenting, memory or concentration loss, fertility, sexual functioning, or any other survivorship concerns            General Guidelines for Health and Cancer Prevention/Risk Reduction  ????  ?? Work to achieve and maintain a healthy weight  ?? Engage in regular physical activity including:  Aerobic exercise at least 150 minutes per week ??  ????????????????????????????????????????????????Strength training exercise at least 2 days per  week ??  ?? Eat a diet that is high in vegetables, fruits, whole grains, legumes (beans) and low in saturated fats (animal fats) ??  ?? Quit/do not start using tobacco  ?? Limit alcohol consumption to no more than 1 drink per day for women/no more than 2 drinks per day for men    If you have any questions or need additional information/support, please discuss these recommendations with your doctor or nurse practitioner.????????????   RESOURCES FOR THE JOURNEY    Breast Cancer Specific:  Living Beyond Breast Cancer ClearanceMarkets.pl  Breast Cancer.org http://www.breastcancer.[place] http://ww5.komen.[place] www.vbcf.org  Facing Our Risk of Cancer Empowered Corporate investment banker) www.facingourrisk.com    General:  Livestrong www.livestrong.[place] www.cancer.[place] www.cancer.org  American Society of Clinical Oncology http://www.cancer.net/research-and-advocacy/asco-care-and-treatment-  recommendations-patients/follow-care-breast-cancer  [employer] for [employer] www.aicr.[employer] International [employer] for [employer] www.canceradvocacy.[place] www.nccn.org  Patient Market researcher.patientadvocate.org  Cancer and Careers www.cancerandcareers.[place] Cancer Survivorship www.bonsecours.com/cancersurvivorship    Prepared By: Melford Aase FNP  [place]  [phone] or [phone]  [email or web address]    Delivered on: 03/19/16    This [place] is a cancer treatment summary and follow-up plan provided to you to keep with your healthcare records and to share with your healthcare providers.  This summary is a brief record of major aspects of your cancer treatment, not a detailed or comprehensive record of your care.

## 2016-03-19 NOTE — Telephone Encounter (Signed)
And would like to know how she wishes to receive it--via MyChart or regular mail.  She would like to have it sent via [place].  I will also be sending a copy to her PCP, Dr. Wanda Plump.  Described what is included in the breast cancer survivorship care plan. Instructed her in how to access her care plan in the MyChart message.  Advised that it will appear she has an appt with me, but this is just the mechanism for sending the care plan to her through [place].  Once she receives the message that there is new information in MyChart, follow these steps:   1) log into MyChart   2) go to the "Visits" tab, and click on "Visit Summaries"   3) look for the office visit with Melford Aase, NP and click on this.    The document that opens up will have her medical history first and then under "Progress Notes" will be the Breast Cancer Survivorship Care Plan information I have prepared for her. Encouraged her to call me with any questions she has once she has reviewed the information or to set up a more formal clinic visit to go over this in person or by phone and receive additional resources.  She is agreeable to this plan.

## 2016-03-20 ENCOUNTER — Encounter

## 2016-03-20 ENCOUNTER — Inpatient Hospital Stay: Admit: 2016-03-20 | Payer: MEDICARE | Primary: Internal Medicine

## 2016-03-20 DIAGNOSIS — I890 Lymphedema, not elsewhere classified: Secondary | ICD-10-CM

## 2016-03-20 NOTE — Progress Notes (Signed)
[place]  [address]      OUTPATIENT physical Therapy Evaluation with CMS G codes    NAME: Jillian Bean AGE: 75 y.o.  GENDER: female  DATE: 03/20/2016  REFERRING PHYSICIAN: Rayford Halsted, MD   HISTORY AND BACKGROUND:This patient is a 75 y/o female with recent history of breast cancer, right, as noted below.  Pt states Dr. Elie Confer referred her for initial evaluation of potential lymphedema.  Pt without c/o swelling, shoulder dysfunction, or pain at this time. Pt reports port was placed 12/20/2014, and chemotherapy initiated 12/21/2014.  Currently continuing with Herceptin infusions thru May 2017.  Pt returns for assessment regarding lymphedema risk reduction.  Information from Dr. Valinda Hoar note as follows:  An abnormal mammogram led to a right core breast biopsy on 10/12/14 showing IDC, 7 mm, gr 3, ER negative, PR negative, ki67 15%, HER 2 positive (IHC 2+; FISH ratio 2.8; sig/cell 5.7).?? Right lumpectomy on 11/10/14 shows IDC, 1.7 cm, gr 3, 0/5 LN, no LVI.?? [id number].    Primary Diagnosis:  ?? 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 Malignant neoplasm of breast (80.1)  Date of Onset: 11/10/2014  Present Symptoms and Functional Limitations: s/p lumpectomy and SLNB R 11/10/2014.  Returns for assessment regarding lymphedema risk reduction.  LLIS: 0/72; [place]   Past Medical History:   Past Medical History:   Diagnosis Date   ??? Back pain 09/01/2007   ??? Cancer (HCC)     R breast   ??? Coagulation disorder ([place])     IRON DEFFICIENCY ANEMIA   ??? Colonic polyps 08/31/1998   ??? 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 Hiatal hernia 07/11/2011    Dr.sobieski   ??? Hypercholesteremia 09/01/2007   ??? Hypothyroidism 09/01/2003   ??? 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 Nausea & vomiting    ??? OA (osteoarthritis) 09/01/2007   ??? Other ill-defined conditions     VERTIGO   ??? Plantar fasciitis 09/01/2007   ??? S/P colonoscopy 10/10/2007   ??? Thyroid disease      Past Surgical History:   Procedure Laterality Date   ??? BREAST SURGERY PROCEDURE UNLISTED  1993    Breast Reduction   ??? ENDOSCOPY, COLON, DIAGNOSTIC  7/12/18/08    7/05 Dr Maudry Diego   ??? HX GI      COLONOSCOPY   ??? HX GYN      Hysterectomy in her 20's   ??? HX HEENT  04/07/10    thyroid biopsy neg dr Leward Quan   ??? HX HEENT      wisdom teeth extraction   ??? HX ORTHOPAEDIC  08/22/09    left TKR   ??? HX ORTHOPAEDIC  08/13/12    right tkr   ??? HX OTHER SURGICAL      MINIMALLY INVASIVE LUMBAR DECOMPRESSION     Current Medications:    Current Outpatient Prescriptions   Medication Sig   ??? diclofenac EC (VOLTAREN) 50 mg EC tablet Take 1 Tab by mouth daily.   ??? biotin 10,000 mcg cap Take  by mouth.   ??? levothyroxine (SYNTHROID) 112 mcg tablet 8 per week   ??? acetaminophen (TYLENOL) 500 mg tablet Take  by mouth every six (6) hours as needed for Pain.   ??? cyclobenzaprine (FLEXERIL)  10 mg tablet Take 1 Tab by mouth nightly as needed for Muscle Spasm(s).   ??? colesevelam (WELCHOL) 625 mg tablet 1 po bid   ??? docusate sodium (COLACE) 100 mg capsule Take 100 mg by mouth daily.   ??? 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 latanoprost (XALATAN) 0.005 % ophthalmic solution Administer  to both eyes nightly.   ??? lidocaine-prilocaine (EMLA) topical cream Apply  to affected area as needed for Pain.   ??? aspirin 81 mg chewable tablet Take 81 mg by mouth daily.   ??? multivitamin (ONE A DAY) tablet Take 1 Tab by mouth daily.   ??? cholecalciferol, vitamin d3, (VITAMIN D) 1,000 unit tablet Take  by mouth daily.     No current facility-administered medications for this encounter.      Allergies:   Allergies   Allergen Reactions   ??? Atorvastatin Myalgia   ??? Zocor [Simvastatin] Other (comments) and Diarrhea     Other reaction(s): Adverse reaction to substance   Myalgias      ??? Celebrex [Celecoxib] Other (comments)     Aggitation   ??? Gabapentin Other (comments)     FELT UNSTEADY ON MY FEET   ??? Levaquin [Levofloxacin] Palpitations   ??? Nexium [Esomeprazole Magnesium] Diarrhea   ??? Oxycontin [Oxycodone] Itching   ??? Pravastatin Nausea and Vomiting   ??? Yellow Dye Hives      Social/Work History and Prior Level of Function: Pt is retired from [employer].  No reported function limitations prior to breast cancer diagnosis/treatment.   Living Situation: lives with spouse.      Trainable Caregiver?:as needed   Self-care/ADLs: indep     Mobility: indep   Sleeping Arrangement:  bed   Adaptive Equipment Owned: walker/cane.   Other: na  Previous Therapy:  None to address potential for lymphedema.  Compression/Lymphedema Equipment:  Compression sleeve/gauntlet fit in clinic prior visit.    SUBJECTIVE:   Pt reports she is doing well.  States she is wearing compression sleeve preventatively, understanding benefit of compression garment wear before/during/after air travel, repetitive UE activities, exercise.  Pt states she and her husband have continued performing UE girth measurements 2x/month without concern.  Patient???s goals for therapy: R UE assessed for lymphedema.    EVALUATION AND OBJECTIVE DATA SUMMARY:   Pain:no c/o pain           Skin and Tissue Assessment:  Dermal Status:  (x )  Intact ( )  Dry   ( )  Tenuous ( )  Flaky   ( )  Wound/lesion present ( )  Scars:    ( )  Dermatitis    Texture/Consistency: na  ( )  Boggy ( )  Pitting Edema   ( )  Brawny ( )  Combination   ( )  Fibrotic/Woody    Pigmentation/Color Change:  (x )  Normal ( )  Hemosiderin   ( )  Red ( )  Erythematous   ( )  Hyperpigmented ( )  Hyperlipodermatosclerosis   Anomalies: na  ( )  Lymphorrhea ( )  Vesicles   ( )  Petechiae ( )  Warty Vercusis   ( )  Bullae ( )  Papilloma     Nails:  (x )  Normal  ( )  Fungus  Stemmers Sign: negative  Height:     Weight:      BMI:     (36 or greater: adversely affecting lymphedema)   Volumetric Measurements:  Right:  1791.78 mL Left:  1961.08 mL   % Difference: -8.63% Dominance: Right   (See scanned graph)  Range of Motion: Bilateral UE grossly WFL.  Sensation:  Intact  Mobility:  Bed/Chair Mobility:  indep Transfers:  indep   Sitting Balance:  good Standing Balance:  good   Gait:  indep community distances without device.  Pt walks regularly for exercise. Wheelchair Mobility:  na   Endurance:  Intact for daily activities, walking program Stairs:  Not assessed.       Safety:  Patient is alert and oriented:  x4   Safety awareness:  intact   Fall Risk?:  low   Patient given written fall prevention handout: Yes   Precautions:  Standard lymphedema precautions to include avoiding blood pressure readings, injections and IVs or other procedures/acts that could lead to broken skin on affected area, and avoiding excessive heat, resistive activity or altitude without compression garment    Evaluation Time: 11:00:11:20 am  20 minutes    TREATMENT PROVIDED:   1.  Treatment description:  The patient was instructed in lymphedema risk reduction tools, including wear of compression garment for moderate to high risk activities, as well as skin care to prevent infection.  Pt was instructed in the benefit of wear of compression garments to involved extremity for wear with exercise, air travel, repetitive UE activities.. Pt advised to notify clinic of any concerns that arise regarding lymphedema, encouraged to monitor UE girth measurements monthly with booklet provided for recording. Pt instructed to continue skin care, including cleansing skin daily involved quadrant/UE, and applying low pH lotion to extremity using upward strokes.  Pt instructed in activities for management of AWS including pairing shoulder abd to 90/full extension elbow/alternating wrist flex/ext x 10 as indicated, however, patient without c/o today.  Pt demonstrates and verbalizes good understanding of  information provided in activity today.  Pt advised to monitor dorsal hand, fit of clothing, seeking attention at this clinic as needed if any noted changes present.  Treatment time:  11:21-11:40 pm  Minutes: 19 minutes        ASSESSMENT:   Jillian Bean is a 75 y.o. female who presents with stage 0 lymphedema, R UE, with limb volume and circumferential axillary measurement, R UE compared with L UE are WNL.  Patient has been educated regarding lymphedema risk reduction, including fitting compression sleeve/gauntlet prior visit, to be worn with high risk UE activities.  Pt is agreeable to proceeding with compression garment wear during high risk activities, skin care, bi-monthly circumference measurements R UE, seeking medical attention as needed.  Patient to return in 12 months to ensure limb volumes/circumferential measurements remain stable, with plan to discharge from care at time as indicated.    This care is medically necessary due to the infection risk with lymphedema, and to improve functional activities.  CDT is necessary to resolve swelling to allow patient to return to wearing normal clothes/footwear, and prevent worsening of symptoms, such as venous stasis ulcerations, infections, or hospitalizations.  Patient will be independent with home program strategies to allow improved ADL ability and mobility and to allow patient to return to greatest functional independence.    Rehabilitation potential is considered to be Excellent.  Factors which may influence rehabilitation potential include recent history of breast cancer, 0/5 involved, however, no measurable or visible swelling noted R UE.  No further treatment recommended at this time, with pt to follow up with clinic in 1 year.  Pt advised in conditions  under which to return to clinic prior to scheduled appt.         In compliance with CMS???s Claims Based Outcome Reporting, the following  G-code set was chosen for this patient based on their primary functional limitation being treated:    The outcome measure chosen to determine the severity of the functional limitation was the LLIS  with a score of 0/72 which was correlated with the impairment scale.    ? Other PT/OT Primary Functional Limitations:    [phone] - CURRENT STATUS: CH - 0% impaired, limited or restricted   G8991 - GOAL STATUS: CH - 0% impaired, limited or restricted   H4742 - D/C STATUS:  CH - 0% impaired, limited or restricted             Patient has participated in goal setting and agrees to work toward plan of care.  Patient was instructed to call if questions or concerns arise.    Thank you for this referral.  Kalman Shan, PT, CLT    Time Calculation: 40 mins    TREATMENT PLAN EFFECTIVE DATES:   03/20/2016   I have read the above plan of care for Jillian Bean.  I certify the above prescribed services are required by this patient and are medically necessary.  The above plan of care has been developed in conjunction with the lymphedema/physical therapist.       Physician Signature: ____________________________________Date:______________

## 2016-03-22 ENCOUNTER — Encounter: Primary: Internal Medicine

## 2016-04-24 NOTE — Telephone Encounter (Signed)
Patient called and left message while I was out of the office last week to let me know she has received the survivorship care plan and to see how to proceed.  I have returned her call this morning but wasn't able to reach her.  I left a message letting her know I am back at work and happy to share with her the various ways of reviewing this information together in person or by phone.  Left my contact phone #s in message for her return call at her convenience.

## 2016-05-04 ENCOUNTER — Encounter

## 2016-05-04 MED ORDER — COLESEVELAM 625 MG TAB
625 mg | ORAL_TABLET | ORAL | 2 refills | Status: DC
Start: 2016-05-04 — End: 2017-03-05

## 2016-05-23 ENCOUNTER — Institutional Professional Consult (permissible substitution): Primary: Internal Medicine

## 2016-05-24 LAB — ECHOCARDIOGRAM COMPLETE 2D W DOPPLER W COLOR: Left Ventricular Ejection Fraction: 60

## 2016-06-04 NOTE — Telephone Encounter (Signed)
From: Jillian Bean  To: [name], MD  Sent: 06/04/2016 7:14 AM EDT  Subject:  Update Medical Information    On  June 01, 2016, I received: Fluzone high-dose 2017-18 0.58ml SYR   Location:  [place], [place], [place] [address]

## 2016-07-17 NOTE — Telephone Encounter (Signed)
Received phone message late yesterday that Ms. Buan would like to proceed with phone review of her cancer survivorship care plan.  I have called her back this morning but was unable to reach her.  Left message giving her my best phone for today while I'm at [facility] and offering to provide the review today at her convenience or to schedule time on another day if she prefers.  Will wait for her return call.

## 2016-07-19 ENCOUNTER — Encounter

## 2016-07-19 NOTE — Telephone Encounter (Signed)
Called Mrs. Overholt at our scheduled time.  Reviewed her survivorship care plan and answered questions she had about invasive cancer, HER2 status, and updated her end-of-treatment issues to include a resurgence of back discomfort and sciatica-like pain in her right leg, experienced at the conclusion of chemotherapy.  Offered to refer her to our cancer rehabilitation specialists, Carrie Riddock, RPT when she stated that this issue is limiting the exercise she can do.  She is interested in seeing Morey Hummingbird for safe exercise evaluation and this referral has been placed and intake staff at [facility] is aware of the referral.  I have sent a revised copy of the care plan to both Mrs. Krienke and Dr. Wanda Plump as she has requested.  Have also added her to survivor email list, as per her request, for information and events in our area for cancer survivors.  Encouraged her to contact me at any time I can be of assistance with cancer survivorship concerns or questions, and she agrees to do so.

## 2016-07-19 NOTE — Progress Notes (Signed)
[facility]  [address]  [place]  [address]    Breast Cancer Survivorship Care Plan    GENERAL INFORMATION    NAME/AGE/GENDER: Jillian Bean is a 75 y.o. female who was born on 1940/10/30.    PHONE: [phone]     Date: 07/19/2016    Referring Provider: Dr. Laurena Bering    Patient Care Team:  [name], MD as PCP - [facility] [phone]  Joyce Gross, MD as Physician (Hematology and Oncology) Medical Oncology at [facility] [phone]  Deanna Artis, MD as Surgeon (General Surgery) [facility] [phone]  Terri Piedra, MD as Physician (Radiation Oncology) [facility] [phone]   Angelina Pih, NP as Nurse Practitioner (Cancer Survivorship) Medical Oncology [phone]    DIAGNOSIS    Cancer type/location/histologic subtype/receptor status: RIGHT breast, 1.7 cm, Grade III invasive ductal carcinoma, ER/PR-, HER2+     Date of diagnosis (year): 2016    TNM/Stage: pT1cpN0/Stage 1A    [place] or Other Recommended Related Tests        Name of test  When/How often Ordering Provider   Mammogram Once a year Dr. Elie Confer      Please continue to see your primary care provider for all general health care recommended for a woman your age, including cancer screening tests.    Possible late and long-term effects that someone with this type of cancer and treatment may experience:  cardiovascular effects and lymphedema    Schedule of Clinical Visits        Coordinating Provider  When/How often Contact information   Primary Care Provider As needed for non-cancer health care [phone]   Medical Oncologist Every 3 to 6 months for the first 3 years, every 6 to 12 months for years 4 and 5, and annually thereafter [phone]    Radiation Oncologist Rotate visits with medical oncologist [phone]   Surgeon Rotate visits with medical oncologist [phone]     CANCER TREATMENT SUMMARY     Surgery: Date and procedure/location/findings: 11/10/14 RIGHT lumpectomy with RIGHT axillary sentinel lymph node biopsy, margins clear, no lymphovascular invasion identified, 5 lymph nodes removed--none were positive for cancer    Radiation: Yes End date (year): 05/16/15 to 06/06/15   Body area treated/dosage: 4256 cGy to RIGHT breast    Systemic Therapy (chemotherapy, biologics, other): Yes: 12/21/14 to 04/12/15; completed one year of maintenance Trastuzumab (Herceptin) on 11/29/15    Regimen Drug Name End Date (Year)   [place] (Q3w x 6) Docetaxel (Taxotere) 64m/m2 2016    Carboplatin AUC 6 2016    Trastuzumab (Herceptin) 815mkg on Cycle 1, then 61m461mg; 61mg57m continued Q3w to complete 1 year of maintenance therapy 2017     Cycle #5 of TCH [place] 1 week on 03/15/15 due to thrombocytopenia     Persistent symptoms or side effects at completion of treatment: Yes (enter type(s)): 5/10 RIGHT knee pain, lower back discomfort due to spinal stenosis and arthritis with radiating pain into RIGHT leg, muscle spasms intermittently into RIGHT buttocks, thigh, and calf--recurred 6 weeks after end of chemotherapy (has had this once before cancer diagnosis)    Clinical Trial: No     Date of other cancer and/or recurrence of primary cancer and subsequent treatment: none, according to documentation in [facility], your [place] [facility] medical record    FAMILIAL CANCER RISK ASSESSMENT    Family history of cancer: per documentation in [facility] and Dr. Darrick Meigs office note--mother with breast cancer at age 4, deceased; paternal aunt with breast cancer, maternal aunt with colon cancer    Genetic/hereditary risk factor(s) or predisposing conditions: family history of breast cancer  Genetic testing: Yes  Results: Results of [facility] Gene Sequence and  Deletion/Duplication Analyses of BRCA 1/2 on 01/21/15 were negative: no clinically significant variants were detected    Genetic counseling: negative test results shared with you by clinical staff at your medical oncologist's office    CONTINUING TREATMENT    Need for Ongoing (Adjuvant) Treatment for Cancer: No, your breast cancer did not have estrogen or progesterone receptors present (it was ER/PR negative) and you would not have benefited from these treatments     DOING YOUR PART AS A CANCER SURVIVOR    Many survivors feel worried or anxious that the cancer will come back after treatment. While it often does not, it???s important to talk with your doctor about the possibility of the cancer returning. Most breast cancer recurrences are found by patients between visits. Tell your doctor if you notice any of the following symptoms, as they may be signs of a cancer recurrence:     ?? New lumps in the breast  ?? Bone pain  ?? Chest pain  ?? Abdominal pain  ?? Shortness of breath or difficulty breathing  ?? Persistent headaches  ?? Persistent coughing  ?? Rash on breast  ?? Nipple discharge (liquid coming from the nipple)    Or any other symptoms should be brought to the attention of your provider:   1. Anything that represents a brand new symptom   2. Anything that represents a persistent symptom   3. Anything you are worried about that might be related to the cancer coming back    Cancer survivors may experience issues with the areas listed below.  If you have any concerns in these or other areas, please speak with your survivorship nurse practitioner or a member of your physician team to find out how to get help with them.    Emotional and mental health, fatigue, weight changes, stopping smoking, physical functioning, insurance, financial advice/assistance, school/work issues, parenting, memory or concentration loss, fertility, sexual functioning, or any other survivorship concerns             General Guidelines for Health and Cancer Prevention/Risk Reduction  ????  ?? Work to achieve and maintain a healthy weight  ?? Engage in regular physical activity including:  Aerobic exercise at least 150 minutes per week ??  ????????????????????????????????????????????????Strength training exercise at least 2 days per week ??  ?? Eat a diet that is high in vegetables, fruits, whole grains, legumes (beans) and low in saturated fats (animal fats) ??  ?? Quit/do not start using tobacco  ?? Limit alcohol consumption to no more than 1 drink per day for women/no more than 2 drinks per day for men    If you have any questions or need additional information/support, please discuss these recommendations with your doctor or nurse practitioner.????????????   RESOURCES FOR THE JOURNEY    Breast Cancer Specific:  Living Beyond Breast Cancer ClearanceMarkets.pl  Breast Cancer.org http://www.breastcancer.[place] http://ww5.komen.[place] www.vbcf.org  Facing Our Risk of Cancer Empowered Corporate investment banker) www.facingourrisk.com    General:  Livestrong www.livestrong.[place] www.cancer.gov  American  Cancer Society www.cancer.org  American Society of Clinical Oncology http://www.cancer.net/research-and-advocacy/asco-care-and-treatment-  recommendations-patients/follow-care-breast-cancer  [employer] for [employer] www.aicr.[employer] International Group for [employer] www.canceradvocacy.[place] www.nccn.org  Patient Market researcher.patientadvocate.org  Cancer and Careers www.cancerandcareers.[place] Cancer Survivorship www.bonsecours.com/cancersurvivorship    Prepared By: Melford Aase FNP  [place]  [phone] or [phone]  [email or web address]    Delivered on: 03/19/16, revised 07/19/16    This [place] is a cancer treatment summary and follow-up plan provided to you to keep with your healthcare records and to share  with your healthcare providers.  This summary is a brief record of major aspects of your cancer treatment, not a detailed or comprehensive record of your care.

## 2016-07-25 ENCOUNTER — Inpatient Hospital Stay: Admit: 2016-09-18 | Payer: MEDICARE | Primary: Internal Medicine

## 2016-07-25 ENCOUNTER — Ambulatory Visit: Admit: 2016-07-25 | Discharge: 2016-07-25 | Payer: MEDICARE | Attending: Internal Medicine | Primary: Internal Medicine

## 2016-07-25 DIAGNOSIS — Z0000 Encounter for general adult medical examination without abnormal findings: Secondary | ICD-10-CM

## 2016-07-25 DIAGNOSIS — E785 Hyperlipidemia, unspecified: Secondary | ICD-10-CM

## 2016-07-25 MED ORDER — ALENDRONATE 70 MG TAB
70 mg | ORAL_TABLET | ORAL | 11 refills | Status: DC
Start: 2016-07-25 — End: 2017-02-15

## 2016-07-25 NOTE — ACP (Advance Care Planning) (Signed)
On file and reviewed

## 2016-07-25 NOTE — Patient Instructions (Signed)
Medicare Wellness Visit, Female    The best way to live healthy is to have a healthy lifestyle by eating a well-balanced diet, exercising regularly, limiting alcohol and stopping smoking.    Regular physical exams and screening tests are another way to keep healthy. Preventive exams provided by your health care provider can find health problems before they become diseases or illnesses. Preventive services including immunizations, screening tests, monitoring and exams can help you take care of your own health.    All people over age 76 should have a pneumovax  and and a prevnar shot to prevent pneumonia. These are once in a lifetime unless you and your provider decide differently.    All people over 65 should have a yearly flu shot and a tetanus vaccine every 10 years.    A bone mass density to screen for osteoporosis or thinning of the bones should be done every 2 years after 65.    Screening for diabetes mellitus with a blood sugar test should be done every year.    Glaucoma is a disease of the eye due to increased ocular pressure that can lead to blindness and it should be done every year by an eye professional.    Cardiovascular screening tests that check for elevated lipids (fatty part of blood) which can lead to heart disease and strokes should be done every 5 years.    Colorectal screening that evaluates for blood or polyps in your colon should be done yearly as a stool test or every five years as a flexible sigmoidoscope or every 10 years as a colonoscopy up to age 75.    Breast cancer screening with a mammogram is recommended biennially  for women age 50-74.    Screening for cervical cancer with a pap smear and pelvic exam is recommended for women after age 76 years every 2 years up to age 70 or when the provider and patient decide to stop.If there is a history of cervical abnormalities or other increased risk for cancer then the test is recommended yearly.     Hepatitis C screening is also recommended for anyone born between 1945 through 1965.    A shingles vaccine is also recommended once in a lifetime after age 60.    Your Medicare Wellness Exam is recommended annually.    Here is a list of your current Health Maintenance items with a due date:  Health Maintenance Due   Topic Date Due   ??? Annual Well Visit  07/19/2016

## 2016-07-25 NOTE — Progress Notes (Signed)
Chief Complaint   Patient presents with   ??? Medication Evaluation     Patient is fasting     The patient's Advanced Care Directive is on file and verified complete.    Health Maintenance Due   Topic Date Due   ??? MEDICARE YEARLY EXAM  07/19/2016     Patient was scheduled for Annual exam 08/28/2015. Flow sheets completed.

## 2016-07-25 NOTE — Progress Notes (Signed)
This is a Subsequent Medicare Annual Wellness Exam (AWV) (Performed 12 months after IPPE or effective date of Medicare Part B enrollment)    I have reviewed the patient's medical history in detail and updated the computerized patient record.     History     Past Medical History:   Diagnosis Date   ??? Back pain 09/01/2007   ??? Cancer (HCC)     R breast   ??? Coagulation disorder ([place])     IRON DEFFICIENCY ANEMIA   ??? Colonic polyps 08/31/1998   ??? 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 Hiatal hernia 07/11/2011    Dr.sobieski   ??? Hypercholesteremia 09/01/2007   ??? Hypothyroidism 09/01/2003   ??? 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 Nausea & vomiting    ??? OA (osteoarthritis) 09/01/2007   ??? Other ill-defined conditions(799.89)     VERTIGO   ??? Plantar fasciitis 09/01/2007   ??? S/P colonoscopy 10/10/2007   ??? Thyroid disease       Past Surgical History:   Procedure Laterality Date   ??? BREAST SURGERY PROCEDURE UNLISTED  1993    Breast Reduction   ??? ENDOSCOPY, COLON, DIAGNOSTIC  7/12/18/08    7/05 Dr Maudry Diego   ??? HX GI      COLONOSCOPY   ??? HX GYN      Hysterectomy in her 57's   ??? HX HEENT  04/07/10    thyroid biopsy neg dr Leward Quan   ??? HX HEENT      wisdom teeth extraction   ??? HX ORTHOPAEDIC  08/22/09    left TKR   ??? HX ORTHOPAEDIC  08/13/12    right tkr   ??? HX OTHER SURGICAL      MINIMALLY INVASIVE LUMBAR DECOMPRESSION     Current Outpatient Prescriptions   Medication Sig Dispense Refill   ??? alendronate (FOSAMAX) 70 mg tablet Take 1 Tab by mouth every seven (7) days. 4 Tab 11   ??? colesevelam (WELCHOL) 625 mg tablet 1 po bid 180 Tab 2   ??? diclofenac EC (VOLTAREN) 50 mg EC tablet Take 1 Tab by mouth daily.  5   ??? biotin 10,000 mcg cap Take  by mouth.     ??? levothyroxine (SYNTHROID) 112 mcg tablet 8 per week 100 Tab 3   ??? acetaminophen (TYLENOL) 500 mg tablet Take  by mouth every six (6) hours as needed for Pain.      ??? docusate sodium (COLACE) 100 mg capsule Take 100 mg by mouth daily.     ??? latanoprost (XALATAN) 0.005 % ophthalmic solution Administer  to both eyes nightly.  6   ??? aspirin 81 mg chewable tablet Take 81 mg by mouth daily.     ??? multivitamin (ONE A DAY) tablet Take 1 Tab by mouth daily.     ??? cholecalciferol, vitamin d3, (VITAMIN D) 1,000 unit tablet Take  by mouth daily.     ??? cyclobenzaprine (FLEXERIL) 10 mg tablet Take 1 Tab by mouth nightly as needed for Muscle Spasm(s). 30 Tab 0   ??? 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 lidocaine-prilocaine (EMLA) topical cream Apply  to affected area as needed for Pain. 30 g 0     Allergies   Allergen Reactions   ??? Atorvastatin Myalgia   ??? Zocor [Simvastatin] Other (comments) and Diarrhea  Other reaction(s): Adverse reaction to substance   Myalgias     ??? Celebrex [Celecoxib] Other (comments)     Aggitation   ??? Gabapentin Other (comments)     FELT UNSTEADY ON MY FEET   ??? Levaquin [Levofloxacin] Palpitations   ??? Nexium [Esomeprazole Magnesium] Diarrhea   ??? Oxycontin [Oxycodone] Itching   ??? Pravastatin Nausea and Vomiting   ??? Yellow Dye Hives     Family History   Problem Relation Age of Onset   ??? Cancer Mother      Breast   ??? Heart Failure Father      MI   ??? Seizures Brother      ms     Social History   Substance Use Topics   ??? Smoking status: Never Smoker   ??? Smokeless tobacco: Never Used   ??? Alcohol use No     Patient Active Problem List   Diagnosis Code   ??? Hypercholesteremia E78.00   ??? OA (osteoarthritis) M19.90   ??? Back pain M54.9   ??? Plantar fasciitis M72.2   ??? Colonic polyps    ??? S/P colonoscopy Z98.890   ??? 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 DJD (degenerative joint disease), cervical M50.30   ??? 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 Iron deficiency anemia D50.9   ??? 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 Unspecified hypothyroidism E03.9   ??? 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 Advanced care planning/counseling discussion Z71.89    ??? 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      Functional Ability and Level of Safety:   Hearing Loss  Hearing she notes some loss and will send for eval    Activities of Daily Living  The home contains: no safety equipment.  Patient does total self care    Fall Risk  Fall Risk Assessment, last 12 mths 07/25/2016   Able to walk? Yes   Fall in past 12 months? Yes   Fall with injury? No   Number of falls in past 12 months 1   Fall Risk Score 1       Abuse Screen  Patient is not abused    Cognitive Screening   Evaluation of Cognitive Function:  Has your family/caregiver stated any concerns about your memory: no  Normal    Patient Care Team   Patient Care Team:  [name], MD as PCP - General  Telford Nab, MD (Cardiology)  Joyce Gross, MD as Physician (Hematology and Oncology)  Steffanie Rainwater III, MD as Surgeon (General Surgery)  Lyndel Safe, MD as Physician (Radiation Oncology)  Angelina Pih, NP as Nurse Practitioner (Oncology)    Assessment/Plan   Education and counseling provided:  Are appropriate based on today's review and evaluation  End-of-Life planning (with patient's consent)  Pneumococcal Vaccine  Influenza Vaccine  Screening Mammography  Colorectal cancer screening tests  Bone mass measurement (DEXA)  Screening for glaucoma    Diagnoses and all orders for this visit:    1. Medicare annual wellness visit, subsequent  -  REFERRAL TO ENT-OTOLARYNGOLOGY    2. Lumbar spine pain  -     [facility]    3. Osteoporosis of femur without pathological fracture  -     alendronate (FOSAMAX) 70 mg tablet; Take 1 Tab by mouth every seven (7) days.    4. Dyslipidemia, goal LDL below 123XX123   -     METABOLIC PANEL, COMPREHENSIVE  -     LIPID PANEL    5. Acquired hypothyroidism  -     TSH 3RD GENERATION    6. Other iron deficiency anemia  -     CBC WITH AUTOMATED DIFF        Health Maintenance Due   Topic Date Due   ??? MEDICARE YEARLY EXAM  07/19/2016

## 2016-07-25 NOTE — Progress Notes (Signed)
HISTORY OF PRESENT ILLNESS  Jillian Bean is a 75 y.o. female.  HPI  Patient is seen for followup of hypothyroidism.   Thyroid ROS: denies fatigue, weight changes, heat/cold intolerance, bowel/skin changes or CVS symptoms.  She had a dexa in 2017showed osteoporosis and was on fosamax for about 1 year but stopped due to ? Rash-really n ot sure this was the cause and did not have any gi sxs-wants to try again and aware that if any rash or gi upset we can try alternate med.  She had anemia and no sxs now but wants to repeat cbc  Main issue is ongoing lumbar pain sp 4 injections with dr Dema Severin and trying a tens unit but wants to see neurosurgery for opinion  Review of Systems   Constitutional: Negative for chills, fever and weight loss.   Respiratory: Negative for cough, shortness of breath and wheezing.    Cardiovascular: Negative for chest pain, palpitations, orthopnea, leg swelling and PND.   Gastrointestinal: Negative for abdominal pain, constipation, diarrhea, heartburn and nausea.   Musculoskeletal: Positive for back pain. Negative for falls and myalgias.   Neurological: Negative for dizziness, focal weakness and headaches.       Physical Exam   Constitutional: She is oriented to person, place, and time. She appears well-developed and well-nourished.   HENT:   Head: Normocephalic and atraumatic.   Neck: Normal range of motion. Neck supple. Carotid bruit is not present. No thyromegaly present.   Cardiovascular: Normal rate, regular rhythm, S1 normal, S2 normal, normal heart sounds and intact distal pulses.    No murmur heard.  Pulmonary/Chest: Effort normal and breath sounds normal. No respiratory distress. She has no wheezes. She has no rales.   Musculoskeletal: She exhibits no edema.   Neurological: She is alert and oriented to person, place, and time.   Psychiatric: She has a normal mood and affect. Her behavior is normal.   Nursing note and vitals reviewed.      ASSESSMENT and PLAN   Diagnoses and all orders for this visit:    1. Medicare annual wellness visit, subsequent  -     REFERRAL TO ENT-OTOLARYNGOLOGY    2. Lumbar spine pain  -     [facility]    3. Osteoporosis of femur without pathological fracture  -     alendronate (FOSAMAX) 70 mg tablet; Take 1 Tab by mouth every seven (7) days.    4. Dyslipidemia, goal LDL below 123XX123  -     METABOLIC PANEL, COMPREHENSIVE  -     LIPID PANEL    5. Acquired hypothyroidism  -     TSH 3RD GENERATION    6. Other iron deficiency anemia  -     CBC WITH AUTOMATED DIFF

## 2016-07-26 LAB — METABOLIC PANEL, COMPREHENSIVE
A-G Ratio: 1.4 (ref 1.2–2.2)
ALT (SGPT): 14 IU/L (ref 0–32)
AST (SGOT): 14 IU/L (ref 0–40)
Albumin: 4 g/dL (ref 3.5–4.8)
Alk. phosphatase: 104 IU/L (ref 39–117)
BUN/Creatinine ratio: 30 — ABNORMAL HIGH (ref 12–28)
BUN: 21 mg/dL (ref 8–27)
Bilirubin, total: 0.3 mg/dL (ref 0.0–1.2)
CO2: 24 mmol/L (ref 18–29)
Calcium: 9.1 mg/dL (ref 8.7–10.3)
Chloride: 102 mmol/L (ref 96–106)
Creatinine: 0.7 mg/dL (ref 0.57–1.00)
GFR est AA: 97 mL/min/{1.73_m2} (ref >59)
GFR est non-AA: 84 mL/min/{1.73_m2} (ref >59)
GLOBULIN, TOTAL: 2.9 g/dL (ref 1.5–4.5)
Glucose: 86 mg/dL (ref 65–99)
Potassium: 4.1 mmol/L (ref 3.5–5.2)
Protein, total: 6.9 g/dL (ref 6.0–8.5)
Sodium: 143 mmol/L (ref 134–144)

## 2016-07-26 LAB — CBC WITH AUTOMATED DIFF
ABS. BASOPHILS: 0 10*3/uL (ref 0.0–0.2)
ABS. EOSINOPHILS: 0.1 10*3/uL (ref 0.0–0.4)
ABS. IMM. GRANS.: 0 10*3/uL (ref 0.0–0.1)
ABS. MONOCYTES: 0.6 10*3/uL (ref 0.1–0.9)
ABS. NEUTROPHILS: 4.3 10*3/uL (ref 1.4–7.0)
Abs Lymphocytes: 1.5 10*3/uL (ref 0.7–3.1)
BASOPHILS: 0 %
EOSINOPHILS: 2 %
HCT: 27.7 % — ABNORMAL LOW (ref 34.0–46.6)
HGB: 8.7 g/dL — ABNORMAL LOW (ref 11.1–15.9)
IMMATURE GRANULOCYTES: 1 %
Lymphocytes: 23 %
MCH: 27.4 pg (ref 26.6–33.0)
MCHC: 31.4 g/dL — ABNORMAL LOW (ref 31.5–35.7)
MCV: 87 fL (ref 79–97)
MONOCYTES: 9 %
NEUTROPHILS: 65 %
PLATELET: 280 10*3/uL (ref 150–379)
RBC: 3.18 x10E6/uL — ABNORMAL LOW (ref 3.77–5.28)
RDW: 14.5 % (ref 12.3–15.4)
WBC: 6.6 10*3/uL (ref 3.4–10.8)

## 2016-07-26 LAB — LIPID PANEL
Cholesterol, total: 189 mg/dL (ref 100–199)
HDL Cholesterol: 61 mg/dL (ref >39)
LDL, calculated: 112 mg/dL — ABNORMAL HIGH (ref 0–99)
Triglyceride: 81 mg/dL (ref 0–149)
VLDL, calculated: 16 mg/dL (ref 5–40)

## 2016-07-26 LAB — CVD REPORT

## 2016-07-26 LAB — TSH 3RD GENERATION: TSH: 2.56 u[IU]/mL (ref 0.450–4.500)

## 2016-07-27 ENCOUNTER — Encounter

## 2016-07-27 NOTE — Progress Notes (Signed)
Extra studies ordered fro anemia

## 2016-07-30 ENCOUNTER — Inpatient Hospital Stay: Admit: 2016-09-17 | Payer: MEDICARE | Primary: Internal Medicine

## 2016-07-30 DIAGNOSIS — D508 Other iron deficiency anemias: Secondary | ICD-10-CM

## 2016-07-31 LAB — VITAMIN B12 & FOLATE
Folate: 14.8 ng/mL (ref >3.0)
Vitamin B12: 542 pg/mL (ref 232–1245)

## 2016-07-31 LAB — IRON PROFILE
Iron % saturation: 22 % (ref 15–55)
Iron: 36 ug/dL (ref 27–139)
TIBC: 161 ug/dL — ABNORMAL LOW (ref 250–450)
UIBC: 125 ug/dL (ref 118–369)

## 2016-07-31 LAB — FERRITIN: Ferritin: 1982 ng/mL — ABNORMAL HIGH (ref 15–150)

## 2016-07-31 NOTE — Progress Notes (Signed)
Message sent about labs  Refer to dr Laurena Bering

## 2016-08-14 ENCOUNTER — Inpatient Hospital Stay
Admit: 2016-08-14 | Payer: MEDICARE | Attending: Rehabilitative and Restorative Service Providers" | Primary: Internal Medicine

## 2016-08-14 DIAGNOSIS — M1990 Unspecified osteoarthritis, unspecified site: Secondary | ICD-10-CM

## 2016-08-14 NOTE — Progress Notes (Signed)
[place], [place]  [place], [place]  Phone: [phone]  Fax: [phone]    Plan of Care/Statement of Necessity for Physical Therapy Services  2-15    Patient name: Jillian Bean  DOB: August 07, 1941  Provider#: [id number]  Referral source: Angelina Pih, NP      Medical/Treatment Diagnosis: Unspecified osteoarthritis, unspecified site [M19.90] LBP with radiculopathy M54.16     Prior Hospitalization: see medical history     Comorbidities: Breast cancer, osteoporosis, visual problems  Prior Level of Function: Active in church and community, Independent with all ADL's  Medications: Verified on Patient Summary List  Start of Care: 08/14/2016      Onset Date: 01/20/2012   The Plan of Care and following information is based on the information from the initial evaluation.    Assessment/ key information: Pt is a 76 y.o female who presents with LBP with R LE radiculopathy which has progressively gotten worse since completion of treatments for cancer.  Pt exhibits decreased trunk and LE strength and motion, impaired balance and gait and increased pain with increased activities involving prolonged sitting/standing/walking which are limiting her usual community involvement and church activities as well as stair navigation in home. Patient will benefit from skilled PT services to address functional mobility deficits, address ROM deficits, address strength deficits, analyze and address soft tissue restrictions, analyze and cue movement patterns, analyze and modify body mechanics/ergonomics, assess and modify postural abnormalities, address imbalance/dizziness and instruct in home and community integration to address rehab goals per plan of care      Evaluation Complexity History MEDIUM  Complexity : 1-2 comorbidities / personal factors will impact the outcome/ POC ; Examination MEDIUM Complexity : 3 Standardized tests and measures addressing body structure,  function, activity limitation and / or participation in recreation  ;Presentation MEDIUM Complexity : Evolving with changing characteristics  ;Clinical Decision Making MEDIUM Complexity : FOTO score of 26-74  Overall Complexity Rating: MEDIUM    Problem List: pain affecting function, decrease ROM, decrease strength, impaired gait/ balance, decrease ADL/ functional abilitiies and decrease activity tolerance   Treatment Plan may include any combination of the following: Therapeutic exercise, Therapeutic activities, Neuromuscular re-education, Physical agent/modality, Gait/balance training, Manual therapy, Patient education, Functional mobility training and Stair training  Patient / Family readiness to learn indicated by: asking questions, trying to perform skills and interest  Persons(s) to be included in education: patient (P)  Barriers to Learning/Limitations: None  Patient Goal (s): ???become more active and improve balance???  Patient Self Reported Health Status: good  Rehabilitation Potential: good    Short Term Goals: To be accomplished in 2 treatments:  1) Pt will be Independent with skin care routine to decrease risk of infection.  2) Pt will be Independent in don/doff/wearing schedule of light spinal compression.   3) Pt will know which signs and symptoms to report immediately to physician concerning their condition.  ??  Long Term Goals: To be accomplished in 12 weeks:  1) Pt will have increased LE strength by 1-2 levels on MMT in order to decrease fall risk, navigate 6-12 steps and increase mobility to return to church and community activities.  2) Pt will have improved SLS scores to 10 secs or > and improved TUG score with appropriate a.d to <11 secs in order to improve safe mobility and decrease fall risk  2) Pt will be Independent with HEP for strengthening, and motion exercises in order to maintain gains achieved  in therapy  3) Pt will utilize correct breath and movement patterns during all ADL's  involving ambulation.  4) Pt will have decreased pain by 2-3 levels on NPS in order to return to church and community activities     Frequency / Duration: Patient to be seen 1-2 times per week for 12 weeks.    Patient/ Caregiver education and instruction: activity modification and exercises      Plan of care has been reviewed with PTA    G-Codes (GP)  Mobility  [id number] Current  CK= 40-59%  [id number] Goal  CJ= 20-39%     The severity rating is based on clinical judgment and the FOTO Score score.    Certification Period: 08/14/2016-10/12/2016  Velora Heckler Charl Wellen MSPT, CLT-LANA 08/14/2016 3:08 PM    ________________________________________________________________________    I certify that the above Therapy Services are being furnished while the patient is under my care. I agree with the treatment plan and certify that this therapy is necessary.    10 Signature:____________________  Date:____________Time: _________

## 2016-08-14 NOTE — Progress Notes (Signed)
PT ONCOLOGY EVAL/DAILY NOTE    Patient Name: Jillian Bean  Date:08/14/2016  DOB: 1941-02-11    Patient DOB Verified  Payor: [place] MEDICARE / Plan: [place] MEDICARE PART A & B / Product Type: Medicare /    In time:10:00  Out time:11:00  Total Treatment Time (min): 60  Total Timed Codes (min): 45  1:1 Treatment Time ([place] only): 64   Visit #: 1 of 24    Treatment Area: Unspecified osteoarthritis, unspecified site [M19.90]    SUBJECTIVE    Any medication changes, allergies to medications, adverse drug reactions, diagnosis change, or new procedure performed?:  No     Yes (see summary sheet for update)    Pain Level :   At Present: 1/10, At [facility] 1/10, At [facility] 5/10          Subjective functional status:     Present Symptoms/History:RIGHT knee pain, lower back discomfort due to spinal stenosis and arthritis with radiating pain into RIGHT leg, muscle spasms intermittently into RIGHT buttocks, thigh, and calf--recurred 6 weeks after end of chemotherapy (has had this once before cancer diagnosis) In December of 2017, low back pain returned, had an bone scan which was negative.  Pt returned to dr Dema Severin for 4 injections (finished in Oct) which did not help.  C/o of LBP that moves into her R LE and turns into "muscle spasms".  Sitting in car for 5 hours aggravates pain. Pt seeing neurosurgeon tomorrow with Dr Percell Miller.  Hx of B TKR which were successful, occasionally R knee pain.  Dr Dema Severin performed MILD, minimally invasive lumbar decompression in June of 2013.    Hx of Breast Cancer:   Surgery: Date and procedure/location/findings: 11/10/14 RIGHT lumpectomy with RIGHT axillary sentinel lymph node biopsy, margins clear, no lymphovascular invasion identified, 5 lymph nodes removed--none were positive for cancer  ??  Radiation: Yes End date (year): 05/16/15 to 06/06/15                        Body area treated/dosage: 4256 cGy to RIGHT breast  ??    Systemic Therapy (chemotherapy, biologics, other): Yes: 12/21/14 to  04/12/15; completed one year of maintenance Trastuzumab (Herceptin) on 11/29/15  ??  Regimen Drug Name End Date (Year)   TCH (Q3w x 6) Docetaxel (Taxotere) 75mg /m2 2016   ?? Carboplatin AUC 6 2016   ?? Trastuzumab (Herceptin) 8mg /kg on Cycle 1, then 6mg /kg; 6mg /kg continued Q3w to complete 1 year of maintenance therapy 2017   ??  Cycle #5 of [place] held 1 week on 03/15/15 due to thrombocytopenia       Referring Provider: Angelina Pih, NP   Medical Oncologist: Dr Michaele Offer   Radiation Oncologist: Dr Gilford Rile   Primary Care Physician: [name], MD  Next Appointment: Dr Laurena Bering 15th of January for blood work  Diagnosis: DJD lumbar spine    Precautions/Indications: hx of anemia, B TKR, osteoporosis, hx of breast cancer  History of Present Illness:   Diagnostic tests/Results:       Bone Scan: negative       ?? Pain Intensity:1 on a scale of 0-10  ?? Pain Aggravating Factors: walking, sitting, increased activity  ?? Pain Relieving Factors: L sidelying, tylenol   ?? Fatigue Intensity: 2 on a scale of 0-10     Has your activity changed since diagnosis?   Yes,  Always had been a active person.  Just returned to  gym at her condominium which  treadmill for 10 mins and bike for 5 mins.  Pt feels her balance is getting worse.  Limitations/Prior level of functioning: Pt active with church community, able to travel and do household chores without increasing pain prior to current LBP  Dominant Hand: right handed  Personal Goals: Become more active and improve balance  Home Situation (including environment, stairs, family support, if living alone, any DME used at home):  2 level condominium which patient has some difficulty navigating steps. Supportive spouse.   Work Status: retired   Current meds: see chart    Barriers:    N/A pain financial time transportation other  Motivation: good  Substance use:   N/A  Alcohol Tobacco other:   FABQ Score: low elevate    Cognition: A & O x 4        OBJECTIVE        Skin/Soft Tissue/Palpation: TTP lumbar paraspinals, R glut med/piriformis, R quadriceps, R ITB.  No signs of infection, no rash or edema     Outcome Measures: FOTO=55/100            Lumbar AROM:  Cervical AROM:        R  L  R  L  Flexion    35     40  Extension     14     10  Side Bending  15  20  15  15   Rotation   10  15  15  15       R LE hip AROM: decreased hip extension and dorsiflexion 3-5 degrees from neutral    LOWER QUARTER   MUSCLE STRENGTH  KEY      R  L  0 - No Contraction  L1, L2 Psoas  3  4  1  - Trace   L3 Quads  3-  4  2 - Poor   L4 Tib Ant  4  4  3  - Fair   L5 EHL  4  4  4  - Good   S1 FHL  4  4  5  - Normal   S2 Hams  3-  4-    UPPER QUARTER   MUSCLE STRENGTH  KEY      R  L  0 - No Contraction  C1, C2 Neck Flex  3     1 - Trace   C3 Side Flex  3  3  2  - Poor   C4 Sh Elev  4  4  3  - Fair   C5 Deltoid/Biceps 4  4  4  - Good   C6 Wrist Ext  4  4  5  - Normal   C7 Triceps  4  4      C8 Thumb Ext  4  4      T1 Hand Inst  4  4             Neurological: Reflexes / Sensations: intact to LT throughout trunk and LE's.  Diminished R axilla, R post arm       Mobility/Gait: sit to stand (30 secs)=10  TUG= 27 secs         SLS L =   11 secs    SLS R= 6 sec "feels like it is going to give way"   Antalgic gait with decreased WB and heel to toe progression on R LE, no a.d, decreased speed        Posture: Forward head, flexed hips  Breath pattern assessment  Diaphragm: difficulty with  initiation even with v.c, not primary  Anterior chest : primary   Accessory respiratory muscle use: none          15 min Eval                  Re-Eval       45 min Therapeutic Exercise:   See flow sheet :   Rationale: increase ROM, increase strength and improve balance to improve the patient???s ability to perform ADL's required for return to work and/or community             With    TE    TA    neuro    other: Patient Education:  Review HEP     Progressed/Changed HEP based on:     positioning    body mechanics    transfers    heat/ice application     other:           Pain Level (0-10 scale) post treatment: 1    ASSESSMENT/Changes in Function: Pt is a 76 y.o female who presents with LBP with R LE radiculopathy which has progressively gotten worse since completion of treatments for cancer.  Pt exhibits decreased trunk and LE strength and motion, impaired balance and gait and increased pain with increased activities involving prolonged sitting/standing/walking which are limiting her usual community involvement and church activities as well as stair navigation in home. Patient will benefit from skilled PT services to address functional mobility deficits, address ROM deficits, address strength deficits, analyze and address soft tissue restrictions, analyze and cue movement patterns, analyze and modify body mechanics/ergonomics, assess and modify postural abnormalities, address imbalance/dizziness and instruct in home and community integration to address rehab goals per plan of care          Goals:  See [place] activities as tolerated       Continue plan of care    Update interventions per flow sheet         Discharge due to:_    Other:_      Velora Heckler Kristopher Attwood MSPT, CLT-LANA 08/14/2016  10:02 AM

## 2016-08-16 ENCOUNTER — Inpatient Hospital Stay
Admit: 2016-08-16 | Payer: MEDICARE | Attending: Rehabilitative and Restorative Service Providers" | Primary: Internal Medicine

## 2016-08-16 NOTE — Progress Notes (Signed)
PT ONCOLOGY DAILY NOTE    Patient Name: Jillian Bean  Date:08/16/2016  DOB: [birth year]    Patient DOB Verified  Payor: [place] MEDICARE / Plan: [place] MEDICARE PART A & B / Product Type: Medicare /    In time:1:45  Out time: 2:45  Total Treatment Time (min): 60  Total Timed Codes (min): 60  1:1 Treatment Time ([place] only): 60   Visit #: 2 of 24    Treatment Area: Unspecified osteoarthritis, unspecified site [M19.90]  Radiculopathy, lumbar region [M54.16]    SUBJECTIVE  Pain Level (0-10 scale): 1  Any medication changes, allergies to medications, adverse drug reactions, diagnosis change, or new procedure performed?:  No     Yes (see summary sheet for update)  Subjective functional status/changes:    No changes reported  Pt reports that she saw the neurosurgeon who did not recommend surgery and encouraged her to continue with her PT.      OBJECTIVE      30 min Therapeutic Exercise:   See flow sheet :   Rationale: increase ROM and increase strength to improve the patient???s ability to perform ADL's required for return to work and/or community      15 min Neuromuscular Re-education:    See flow sheet :   Rationale: increase strength, improve coordination and improve balance  to improve the patient???s ability to incorporate correct movement patterns into functional ADL's    15 min Manual Therapy:  STM lumbar paraspinals, R glut med/piriformis/ITB, R LE long axis distraction, belt manual traction   Rationale: decrease pain, increase ROM, increase tissue extensibility, decrease trigger points and increase postural awareness to effectively use extremity during functional tasks (reaching, grooming, dressing, walking)           With    TE    TA    neuro    other: Patient Education:  Review HEP     Progressed/Changed HEP based on:    positioning    body mechanics    transfers    heat/ice application     other:            Pain Level (0-10 scale) post treatment: 1    ASSESSMENT/Changes in Function: Pt tolerated added stretches and exercises  without increasing R LE symptoms or back pain.  Requires v.c's for form, breath and posture during all exercises    Patient will benefit from skilled PT services to modify and progress therapeutic interventions, decrease pain and increase function to address rehab goals per plan of care             PLAN    Upgrade activities as tolerated       Continue plan of care    Update interventions per flow sheet         Discharge due to:_    Other:_      Velora Heckler [name], MSPT, CLT-LANA   08/16/2016

## 2016-08-21 ENCOUNTER — Inpatient Hospital Stay: Admit: 2016-08-21 | Payer: MEDICARE | Primary: Internal Medicine

## 2016-08-21 NOTE — Progress Notes (Addendum)
PT ONCOLOGY EVAL/DAILY NOTE    Patient Name: Jillian Bean  Date:08/21/2016  DOB: August 21, 1940    Patient DOB Verified  Payor: [place] MEDICARE / Plan: [place] MEDICARE PART A & B / Product Type: Medicare /    In time:9:30a  Out time:10:30a  Total Treatment Time (min): 60  Total Timed Codes (min): 60  1:1 Treatment Time ([place] only): 60   Visit #: 3 of 24    Treatment Area: Unspecified osteoarthritis, unspecified site [M19.90]  Radiculopathy, lumbar region [M54.16]    SUBJECTIVE  Pain Level (0-10 scale): 1  Any medication changes, allergies to medications, adverse drug reactions, diagnosis change, or new procedure performed?:  No     Yes (see summary sheet for update)  Subjective functional status/changes:    No changes reported  Patient reports she has some pain down the RLE and stiffness through the low back.  ??  OBJECTIVE    30 min Therapeutic Exercise:   See flow sheet :   Rationale: increase ROM and increase strength to improve the patient???s ability to perform ADL's required for return to work and/or community.    15 min Neuromuscular Re-education:    See flow sheet :   Rationale: increase strength, improve coordination and improve balance  to improve the patient???s ability to incorporate correct movement patterns into functional ADL's    15 min Manual Therapy:  STM lumbar paraspinals, R glut med/piriformis/ITB, R LE long axis distraction, belt manual traction   Rationale: decrease pain, increase ROM, increase tissue extensibility, decrease trigger points and increase postural awareness to effectively use extremity during functional tasks (reach, grooming, dressing, walking)        With    TE    TA    neuro    other: Patient Education:  Review HEP     Progressed/Changed HEP based on:    positioning    body mechanics    transfers    heat/ice application     other:      Other Objective/Functional Measures: no pain with interventions     Pain Level (0-10 scale) post treatment: 0 "better"     ASSESSMENT/Changes in Function: Pt did well with all interventions with relief of symptoms. Pt required verbal cues for proper mechanics and breathing throughout.    Patient will benefit from skilled PT services to modify and progress therapeutic interventions and decrease pain and increase function to address rehab goals per plan of care            PLAN    Upgrade activities as tolerated       Continue plan of care    Update interventions per flow sheet         Discharge due to:_    Other:_      Trishna Cwik PTA  08/21/2016  9:41 AM

## 2016-08-23 ENCOUNTER — Inpatient Hospital Stay: Admit: 2016-08-23 | Payer: MEDICARE | Primary: Internal Medicine

## 2016-08-23 NOTE — Progress Notes (Signed)
PT ONCOLOGY EVAL/DAILY NOTE    Patient Name: Jillian Bean  Date:08/23/2016  DOB: Dec 24, 1940    Patient DOB Verified  Payor: [place] MEDICARE / Plan: [place] MEDICARE PART A & B / Product Type: Medicare /    In time:10:00a  Out time:11:00a  Total Treatment Time (min): 60  Total Timed Codes (min): 60  1:1 Treatment Time ([place] only): 60   Visit #: 4 of 24    Treatment Area: Unspecified osteoarthritis, unspecified site [M19.90]  Radiculopathy, lumbar region [M54.16]    SUBJECTIVE  Pain Level (0-10 scale): 0.5  Any medication changes, allergies to medications, adverse drug reactions, diagnosis change, or new procedure performed?:  No     Yes (see summary sheet for update)  Subjective functional status/changes:    No changes reported  Patient reports she feels the exercises are helping and only has slight pain down the anterior RLE, however is still taking pain medications (Tylenol).  ??  OBJECTIVE    30 min Therapeutic Exercise:   See flow sheet :   Rationale: increase ROM and increase strength to improve the patient???s ability to perform ADL's required for return to work and/or community.    30 min Neuromuscular Re-education:    See flow sheet :   Rationale: increase strength, improve coordination and improve balance  to improve the patient???s ability to incorporate correct movement patterns into functional ADL's    With    TE    TA    neuro    other: Patient Education:  Review HEP     Progressed/Changed HEP based on:    positioning    body mechanics    transfers    heat/ice application     other:      Other Objective/Functional Measures: no pain with interventions  SLS: L 1-2 LOB, R several LOB  Step ups: no UE assistance for LLE, UE assistance required for proper mechanics RLE     Pain Level (0-10 scale) post treatment: 0 "better"    ASSESSMENT/Changes in Function: Pt able to advance several standing exercises with no increased pain. Patient increased difficulty and min increased fatigue with standing exercises R>L.      Patient will benefit from skilled PT services to modify and progress therapeutic interventions and decrease pain and increase function to address rehab goals per plan of care            PLAN    Upgrade activities as tolerated       Continue plan of care    Update interventions per flow sheet         Discharge due to:_    Other:_      Jakyia Gaccione PTA  08/23/2016  9:41 AM

## 2016-08-27 ENCOUNTER — Ambulatory Visit: Admit: 2016-08-27 | Discharge: 2016-08-27 | Payer: MEDICARE | Attending: Internal Medicine | Primary: Internal Medicine

## 2016-08-27 ENCOUNTER — Encounter: Attending: Internal Medicine | Primary: Internal Medicine

## 2016-08-27 DIAGNOSIS — M5116 Intervertebral disc disorders with radiculopathy, lumbar region: Secondary | ICD-10-CM

## 2016-08-27 MED ORDER — DICLOFENAC 50 MG TAB, DELAYED RELEASE
50 mg | ORAL_TABLET | Freq: Two times a day (BID) | ORAL | 5 refills | Status: DC | PRN
Start: 2016-08-27 — End: 2016-12-04

## 2016-08-27 MED ORDER — RANITIDINE 150 MG TAB
150 mg | ORAL_TABLET | Freq: Two times a day (BID) | ORAL | 5 refills | Status: DC
Start: 2016-08-27 — End: 2017-04-08

## 2016-08-27 NOTE — Progress Notes (Signed)
HISTORY OF PRESENT ILLNESS  Jillian Bean is a 76 y.o. female.  HPI  Follow up.  Issues:  1. Osteoporosis.  She is back on Fosamax weekly.  Seems to be tolerating.  No nausea, vomiting or GI upset.  2. Back pain.  She did see Dr. Doreene Eland from neurosurgery, who reviewed her MRI, stated her findings were more severe at L2-L3, but she could see why they were trying injections at L4.  Referred her for physical therapy, which has been helpful and suggested more shots with Dr. Dema Severin are reasonable if needed.  Jillian Bean notes her pain is doing better.  She is using Diclofenac once or twice a day, which does really help with the pain, and we discussed this could cause gastritis and might be contributing to her anemia.  We are going to add Zantac for GI prophylaxis.  She has follow up with Dr. Laurena Bering for the anemia tomorrow.  She's not seen GI bleeding.      Review of Systems   Constitutional: Negative for chills, fever, malaise/fatigue and weight loss.   Respiratory: Negative for cough, shortness of breath and wheezing.    Cardiovascular: Negative for chest pain, palpitations, orthopnea, leg swelling and PND.   Gastrointestinal: Negative for abdominal pain, blood in stool, heartburn, melena, nausea and vomiting.   Musculoskeletal: Negative for myalgias.   Neurological: Negative for dizziness and headaches.       Physical Exam   Constitutional: She is oriented to person, place, and time. She appears well-developed and well-nourished.   Neurological: She is alert and oriented to person, place, and time.   Psychiatric: She has a normal mood and affect. Her behavior is normal. Judgment and thought content normal.   Nursing note and vitals reviewed.      ASSESSMENT and PLAN  Diagnoses and all orders for this visit:    1. Lumbar disc disease with radiculopathy-improving now with pt and on nsaid-discussed risks of nsaid with possible gastritis although no sxs  will add zantac for gi prophylaxis and she will see dr Laurena Bering for anemia follow up as planned  -     diclofenac EC (VOLTAREN) 50 mg EC tablet; Take 1 Tab by mouth two (2) times daily as needed.  -     raNITIdine (ZANTAC) 150 mg tablet; Take 1 Tab by mouth two (2) times a day.    2. Other osteoporosis without current pathological fracture    Over 50% of the 15 minutes face to face with Jillian Bean consisted of counseling and/or discussing treatment plans in reference to her meds.

## 2016-08-28 ENCOUNTER — Inpatient Hospital Stay
Admit: 2016-08-28 | Payer: MEDICARE | Attending: Rehabilitative and Restorative Service Providers" | Primary: Internal Medicine

## 2016-08-28 ENCOUNTER — Ambulatory Visit: Admit: 2016-08-28 | Discharge: 2016-08-28 | Payer: MEDICARE | Attending: Nurse Practitioner | Primary: Internal Medicine

## 2016-08-28 DIAGNOSIS — Z853 Personal history of malignant neoplasm of breast: Secondary | ICD-10-CM

## 2016-08-28 NOTE — Progress Notes (Signed)
PT ONCOLOGY DAILY NOTE    Patient Name: Jillian Bean  Date:08/28/2016  DOB: Apr 15, 1941    Patient DOB Verified  Payor: VA MEDICARE / Plan: VA MEDICARE PART A & B / Product Type: Medicare /    In time:2:00  Out time:3:00  Total Treatment Time (min): 60  Total Timed Codes (min): 60  1:1 Treatment Time ([place] only): 60   Visit #: 5 of 24    Treatment Area: Unspecified osteoarthritis, unspecified site [M19.90]  Radiculopathy, lumbar region [M54.16]    SUBJECTIVE  Pain Level (0-10 scale): 1 R LE  Any medication changes, allergies to medications, adverse drug reactions, diagnosis change, or new procedure performed?:  No     Yes (see summary sheet for update)  Subjective functional status/changes:    No changes reported  Pt reports that LB pain is much better particularly following stretches in morning but that R LE continues to be painful when descending steps.  Was able to do TM at apartment complex gym but after 8 min had increasing R knee pain lateral and posterior.  Pt  is taking less Tylenol.  Pt states her PCP took blood and found her Hemoglobin was decreased.  PCP sent her to Dr Laurena Bering. She is scheduled for more blood work on Thursday 08/30/2016 and may be late to next PT appointment.      OBJECTIVE        45 min Therapeutic Exercise:   See flow sheet :   Rationale: increase ROM and increase strength to improve the patient???s ability to perform ADL's required for return to work and/or community       15 min Neuromuscular Re-education:    See flow sheet :   Rationale: increase strength, improve coordination, improve balance and increase proprioception  to improve the patient???s ability to incorporate correct movement patterns into functional ADL's              With    TE    TA    neuro    other: Patient Education:  Review HEP     Progressed/Changed HEP based on: subjective and objective findings and pain reported with some home exercises   positioning    body mechanics    transfers    heat/ice application     other:       Palpation: Pt TTP along R anterior tibialis, ITB, and lateral patella     Pain Level (0-10 scale) post treatment: 1    ASSESSMENT/Changes in Function: Pt was able to perform modified exercises without pain and demonstrated correct form and breath during therapy session.  Exercises and stretches were added to address  R ITB and hamstring tightness    Patient will benefit from skilled PT services to modify and progress therapeutic interventions, address functional mobility deficits, address ROM deficits, address strength deficits, analyze and address soft tissue restrictions, analyze and modify body mechanics/ergonomics, assess and modify postural abnormalities, address imbalance/dizziness and instruct in home and community integration to address rehab goals per plan of care            PLAN    Upgrade activities as tolerated       Continue plan of care    Update interventions per flow sheet         Discharge due to:_    Other:_      Velora Heckler Kedrick Mcnamee MSPT, CLT-LANA  08/28/2016

## 2016-08-28 NOTE — Progress Notes (Signed)
[facility]  [address], [place]   [address]  W: [phone]   F: [phone]      F/u HEME/ONC CONSULT    Reason for visit: evaluation for treatment for breast cancer    Consulting physician: Dr. Gilford Rile    HPI:   Jillian Bean is a 76 y.o.  female who I was asked to see in consultation at the request of Dr. Elie Confer for evaluation for systemic therapy for breast cancer.    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 Right lumpectomy on 11/10/14 shows IDC, 1.7 cm, gr 3, 0/5 LN, no LVI.  PT1cN0Mx.    TCH q 3 weeks x 6: 12/21/14- 04/12/15  C#5 held 1 week on 03/15/15 due to thrombocytopenia   Outback Herceptin: 05/03/15-11/29/15    S/p XRT 06/03/15    Interval History: In today for follow up. Complains of gr 1 constipation, gr 1 fatigue, gr 1 pain in lower back and right leg. Her PCP had her come back to see [name] for low hemoglobin.     07/25/16 hgb 8.7  06/20/16 hgb 11.5    DX   Encounter Diagnoses   Name Primary?   ??? History of breast cancer Yes   ??? Age-related osteoporosis without current pathological fracture    ??? Acute bilateral low back pain with sciatica, sciatica laterality unspecified    ??? Anemia, normocytic normochromic      Past Medical History:   Diagnosis Date   ??? Back pain 09/01/2007   ??? Cancer ([place])     R breast   ??? Coagulation disorder ([place])     IRON DEFFICIENCY ANEMIA   ??? Colonic polyps 08/31/1998   ??? 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 Hiatal hernia 07/11/2011    Dr.sobieski   ??? Hypercholesteremia 09/01/2007   ??? Hypothyroidism 09/01/2003   ??? 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 Nausea & vomiting    ??? OA (osteoarthritis) 09/01/2007   ??? Other ill-defined conditions(799.89)     VERTIGO   ??? Plantar fasciitis 09/01/2007    ??? S/P colonoscopy 10/10/2007   ??? Thyroid disease      Past Surgical History:   Procedure Laterality Date   ??? BREAST SURGERY PROCEDURE UNLISTED  1993    Breast Reduction   ??? ENDOSCOPY, COLON, DIAGNOSTIC  7/12/18/08    7/05 Dr Maudry Diego   ??? HX GI      COLONOSCOPY   ??? HX GYN      Hysterectomy in her 28's   ??? HX HEENT  04/07/10    thyroid biopsy neg dr Leward Quan   ??? HX HEENT      wisdom teeth extraction   ??? HX ORTHOPAEDIC  08/22/09    left TKR   ??? HX ORTHOPAEDIC  08/13/12    right tkr   ??? HX OTHER SURGICAL      MINIMALLY INVASIVE LUMBAR DECOMPRESSION     Social History     Social History   ??? Marital status: MARRIED     Spouse name: N/A   ??? Number of children: N/A   ??? Years of education: N/A     Social History Main Topics   ??? Smoking  status: Never Smoker   ??? Smokeless tobacco: Never Used   ??? Alcohol use No   ??? Drug use: No   ??? Sexual activity: Not Currently     Other Topics Concern   ??? None     Social History Narrative     Family History   Problem Relation Age of Onset   ??? Cancer Mother      Breast   ??? Heart Failure Father      MI   ??? Seizures Brother      ms       Current Outpatient Prescriptions   Medication Sig Dispense Refill   ??? diclofenac EC (VOLTAREN) 50 mg EC tablet Take 1 Tab by mouth two (2) times daily as needed. 60 Tab 5   ??? raNITIdine (ZANTAC) 150 mg tablet Take 1 Tab by mouth two (2) times a day. 60 Tab 5   ??? alendronate (FOSAMAX) 70 mg tablet Take 1 Tab by mouth every seven (7) days. 4 Tab 11   ??? colesevelam (WELCHOL) 625 mg tablet 1 po bid 180 Tab 2   ??? biotin 10,000 mcg cap Take  by mouth.     ??? levothyroxine (SYNTHROID) 112 mcg tablet 8 per week 100 Tab 3   ??? docusate sodium (COLACE) 100 mg capsule Take 100 mg by mouth daily.     ??? latanoprost (XALATAN) 0.005 % ophthalmic solution Administer  to both eyes nightly.  6   ??? aspirin 81 mg chewable tablet Take 81 mg by mouth daily.     ??? multivitamin (ONE A DAY) tablet Take 1 Tab by mouth daily.      ??? cholecalciferol, vitamin d3, (VITAMIN D) 1,000 unit tablet Take  by mouth daily.     ??? acetaminophen (TYLENOL) 500 mg tablet Take  by mouth every six (6) hours as needed for Pain.       Allergies   Allergen Reactions   ??? Atorvastatin Myalgia   ??? Zocor [Simvastatin] Other (comments) and Diarrhea     Other reaction(s): Adverse reaction to substance   Myalgias     ??? Celebrex [Celecoxib] Other (comments)     Aggitation   ??? Gabapentin Other (comments)     FELT UNSTEADY ON MY FEET   ??? Levaquin [Levofloxacin] Palpitations   ??? Nexium [Esomeprazole Magnesium] Diarrhea   ??? Oxycontin [Oxycodone] Itching   ??? Pravastatin Nausea and Vomiting   ??? Yellow Dye Hives       Review of Systems    A comprehensive review of systems was performed and all systems were negative except for HPI and for the symptom report form, reviewed and scanned in.    Objective:  Physical Exam:  Visit Vitals   ??? BP 130/43   ??? Pulse 80   ??? Temp 97 ??F (36.1 ??C) (Temporal)   ??? Resp 18   ??? Ht 5' 5" (1.651 m)   ??? Wt 146 lb 6.4 oz (66.4 kg)   ??? LMP 04/13/2010   ??? SpO2 98%   ??? BMI 24.36 kg/m2       General:  Alert, cooperative, no distress, appears stated age.   Head:  Normocephalic, without obvious abnormality, atraumatic.   Eyes:  Conjunctivae/corneas clear. PERRL, EOMs intact.   Throat: Lips, mucosa, and tongue normal.    Neck: Supple, symmetrical, trachea midline, no adenopathy, thyroid: no enlargement/tenderness/nodules   Back:   Symmetric, no curvature. ROM normal. No CVA tenderness.   Lungs:   Clear to auscultation bilaterally.  Chest wall:  No tenderness or deformity.   Heart:  Regular rate and rhythm, S1, S2 normal, no murmur, click, rub or gallop.   Breast Exam:  S/p R lumpectomy.    Abdomen:   Soft, non-tender. Bowel sounds normal. No masses,  No organomegaly.   Extremities: Extremities normal, atraumatic, no cyanosis or edema.   Skin: Improved macular rash on elbows, back, abdomen lower, upper thighs, back of legs    Lymph nodes: Cervical, supraclavicular, and axillary nodes normal.   Neurologic: CNII-XII intact.     Diagnostic Imaging     12/16/14 TTE: EF 71%.  03/02/15 TTE: EF 68%  05/30/15 TTE :  EF 65%  07/21/15 TTE: EF 66%  10/06/15 TTE: EF 62%  05/23/16 TTE EF 60%    03/15/16 dexa  Findings:  ????  Fractures identified on Lateral scanogram:  None  ????  Femoral Neck:  Right  Bone mineral density (gm/cm2):? 0.649  % of peak bone mass: 63  % for age matched controls:? 86  T-score: -2.8  Z-score: -0.8  ????  Total Hip: Right  Bone mineral density (gm/cm2):  0.761  % of peak bone mass:   76  % for age matched controls:  98  T-score:   -2.0  Z-score:  -0.1  ??  Lumbar Spine:  L1-L4  Bone mineral density (gm/cm2):  1.211  % of peak bone mass:  101   % for age matched controls:  124  T-score:  0.1  Z-score:  2.0  ??  The T score for the left distal third radius is -1.4.  ????  IMPRESSION  Impression:  ????  This patient is osteoporotic using the World Health Organization criteria  As compared to the prior study, there has been no change in the bone mineral  density of the right total hip and an increase in the bone mineral density of  the lumbar spine of 2.0%.  10 year probability of major osteoporotic fracture:  19.6%  10 year probability of hip fracture:  7.4%  ??    Lab Results  Lab Results   Component Value Date/Time    WBC 6.6 07/25/2016 02:57 PM    HGB 8.7 07/25/2016 02:57 PM    HCT 27.7 07/25/2016 02:57 PM    PLATELET 280 07/25/2016 02:57 PM    MCV 87 07/25/2016 02:57 PM     Lab Results   Component Value Date/Time    Sodium 143 07/25/2016 02:57 PM    Potassium 4.1 07/25/2016 02:57 PM    Chloride 102 07/25/2016 02:57 PM    CO2 24 07/25/2016 02:57 PM    Anion gap 7 02/22/2015 09:56 AM    Glucose 86 07/25/2016 02:57 PM    BUN 21 07/25/2016 02:57 PM    Creatinine 0.70 07/25/2016 02:57 PM    BUN/Creatinine ratio 30 07/25/2016 02:57 PM    GFR est AA 97 07/25/2016 02:57 PM    GFR est non-AA 84 07/25/2016 02:57 PM    Calcium 9.1 07/25/2016 02:57 PM     AST (SGOT) 14 07/25/2016 02:57 PM    Alk. phosphatase 104 07/25/2016 02:57 PM    Protein, total 6.9 07/25/2016 02:57 PM    Albumin 4.0 07/25/2016 02:57 PM    Globulin 2.5 04/12/2015 09:23 AM    A-G Ratio 1.4 07/25/2016 02:57 PM    ALT (SGPT) 14 07/25/2016 02:57 PM       Lab Results   Component Value Date/Time    Iron % saturation 22 07/30/2016  09:31 AM    TIBC 161 07/30/2016 09:31 AM    Ferritin 1982 07/30/2016 09:31 AM    Vitamin B12 542 07/30/2016 09:31 AM    Folate 14.8 07/30/2016 09:31 AM    TSH 2.560 07/25/2016 02:57 PM     Lab Results   Component Value Date/Time    INR 1.5 09/05/2012 03:53 AM    aPTT 28.7 08/18/2009 01:05 PM       Assessment/Plan:  76 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  PS 0    1. Right Breast cancer stage: IA    Hormonal therapy: not indicated due to receptor (-) status    We explained to the patient that the goal of systemic adjuvant therapy is to improve the chances for cure and decrease the risk of relapse. We explained why a patient can have microscopic cancer spread now even though physical examination, laboratory studies and imaging studies are negative for cancer. We explained that the same treatments used now as adjuvant or preventive treatments rarely if ever are curative in women who develop metastases.     No evidence of recurrence    Mammogram in March 2017 negative at [facility] breast center, due in March.       She declined to take neratinib.    2. Emotional well being: She has excellent support and is coping well with her disease.    3. FH of breast cancer: BRCA 1/2 [place] testing negative    4. Osteoporosis: On dexa from 2015; is now on fosamax from Dr. Wanda Plump, started 08/2016. DEXA 03/15/16 showed some improvement    5. Back pain low/radiculopathy: Dr. Dema Severin, pain specialist, was following her. NM bone scan and xr show degeneration. Steroid injection was last part of oct; L-spine MRI 01/19/16 shows DDD     6. Anemia:  New; Normocytic; unclear as to drop since Nov. Elevated ferritin as well.  No B12 def, normal folate, normal iron studies.  Normal TSH    The differential diagnosis for a normocytic anemia is broad, and includes blood loss, anemia of chronic disease, chronic renal insufficiency, iron deficiency, hypothyroidism, hemolysis, and bone marrow suppression.  MDS, B12 deficiency, folate deficiency, and etoh abuse can also lead to anemia, though it is generally macrocytic.  I will obtain some additional labwork today to further investigate, including CBC with differential,  retic count, haptoglobin, LDH.     She is taking biotin 10,000 mcg daily, will have her stop and recheck her labs next week.    If the above tests are normal on repeat, will reassure her.  If her anemia is still present and no answer is provided, then may proceed with bone marrow biopsy and/or CT scans as her ferritin is elevated.  May need GI eval    Records reviewed and summarized above.      Thank you for this consult.  All of the patient's questions were answered today.        Follow-up Disposition:  Return in about 4 weeks (around 09/25/2016).    Sonda Rumble, MD

## 2016-08-28 NOTE — Progress Notes (Signed)
Jillian Bean is a 76 y.o. female here for follow-up of breast cancer.

## 2016-08-30 ENCOUNTER — Inpatient Hospital Stay: Admit: 2016-08-30 | Payer: MEDICARE | Primary: Internal Medicine

## 2016-08-30 NOTE — Progress Notes (Signed)
PT ONCOLOGY EVAL/DAILY NOTE    Patient Name: Jillian Bean  Date:08/30/2016  DOB: 05-Sep-1940    Patient DOB Verified  Payor: [place] MEDICARE / Plan: [place] MEDICARE PART A & B / Product Type: Medicare /    In time:10:00a  Out time:11:00a  Total Treatment Time (min): 60  Total Timed Codes (min): 60  1:1 Treatment Time ([place] only): 60   Visit #: 6 of 24    Treatment Area: Unspecified osteoarthritis, unspecified site [M19.90]  Radiculopathy, lumbar region [M54.16]    SUBJECTIVE  Pain Level (0-10 scale): 1- hips/knees  Any medication changes, allergies to medications, adverse drug reactions, diagnosis change, or new procedure performed?:  No     Yes (see summary sheet for update)  Subjective functional status/changes:    No changes reported  Patient reports she was a little sore after last visit and her hips continues to be a little sore. Patient states bother knees are bothering her some today as well. Pt states her doctor took her off Biotin and has to wait until next week, around 09/06/16, before she can get blood work again. Patient states she thinks the piriformis stretch is bothering her knees.   ??  OBJECTIVE    45 min Therapeutic Exercise:   See flow sheet :   Rationale: increase ROM and increase strength to improve the patient???s ability to perform ADL's required for return to work and/or community.    15 min Neuromuscular Re-education:    See flow sheet :   Rationale: increase strength, improve coordination and improve balance  to improve the patient???s ability to incorporate correct movement patterns into functional ADL's    With    TE    TA    neuro    other: Patient Education:  Review HEP     Progressed/Changed HEP based on: adjustment made to supine piriformis stretch to prevent B knee pain.   positioning    body mechanics    transfers    heat/ice application     other:      Other Objective/Functional Measures: no pain with ankle supported on opposite knee for piriformis stretch in supine,   SLS: light touch Bilaterally, min hip sway, no LOB  Step ups: light touch bilaterally  Foam stance: 1-2 LOB bilaterally on first trial with increased fatigue, pt required UE assist to help; no LOB second trial     Pain Level (0-10 scale) post treatment: 0-knees, "sore"-hips    ASSESSMENT/Changes in Function:  Pt able to tolerate advanced standing exercise focusing on quad control and strength to improve ease with descending steps at home. Adjustments made to several stretches to prevent further knee pain and irritation.    Patient will benefit from skilled PT services to modify and progress therapeutic interventions, address functional mobility deficits, address ROM deficits, address strength deficits, analyze and address soft tissue restrictions, analyze and cue movement patterns, analyze and modify body mechanics/ergonomics, address imbalance/dizziness and decrease pain and increase function to address rehab goals per plan of care            PLAN     Upgrade activities as tolerated       Continue plan of care    Update interventions per flow sheet         Discharge due to:_    Other:_      Casimira Sutphin PTA  08/30/2016  10:41 AM

## 2016-09-04 ENCOUNTER — Inpatient Hospital Stay: Admit: 2016-09-04 | Payer: MEDICARE | Primary: Internal Medicine

## 2016-09-04 NOTE — Progress Notes (Signed)
PT ONCOLOGY EVAL/DAILY NOTE    Patient Name: Jillian Bean  Date:09/04/2016  DOB: [birth year]    Patient DOB Verified  Payor: [place] MEDICARE / Plan: [place] MEDICARE PART A & B / Product Type: Medicare /    In time:2:30p  Out time:3:30p  Total Treatment Time (min): 60  Total Timed Codes (min): 60  1:1 Treatment Time ([place] only): 60   Visit #: 7 of 24    Treatment Area: Unspecified osteoarthritis, unspecified site [M19.90]  Radiculopathy, lumbar region [M54.16]    SUBJECTIVE  Pain Level (0-10 scale): 1 knees  Any medication changes, allergies to medications, adverse drug reactions, diagnosis change, or new procedure performed?:  No     Yes (see summary sheet for update)  Subjective functional status/changes:    No changes reported  Patient reports she tried to cut back on her Tylenol dose before bed, but had a little more soreness after last session and was unable to do so. Patient reports she feels like the steps are getting easier and her legs are getting stronger overall.  ??  OBJECTIVE    45 min Therapeutic Exercise:   See flow sheet :   Rationale: increase ROM and increase strength to improve the patient???s ability to perform ADL's required for return to work and/or community.    15 min Neuromuscular Re-education:    See flow sheet :   Rationale: increase strength, improve coordination and improve balance  to improve the patient???s ability to incorporate correct movement patterns into functional ADL's    With    TE    TA    neuro    other: Patient Education:  Review HEP     Progressed/Changed HEP based on: adjustment made to supine piriformis stretch to prevent B knee pain.   positioning    body mechanics    transfers    heat/ice application     other:      Other Objective/Functional Measures:   SLS: light touch Bilaterally, min hip sway, no LOB  Step ups: No UE bilaterally, increased fatigue noted on RLE  Foam stance: 1 LOB bilaterally on first trial with increased fatigue, no LOB second trial      Pain Level (0-10 scale) post treatment: 1    ASSESSMENT/Changes in Function:  Pt with no c/o of pain with interventions. Pt with improved tolerance for forward and lateral step ups and will do well with continued progression as tolerated.    Patient will benefit from skilled PT services to modify and progress therapeutic interventions, address functional mobility deficits, address ROM deficits, address strength deficits, analyze and address soft tissue restrictions, analyze and cue movement patterns, analyze and modify body mechanics/ergonomics, address imbalance/dizziness and decrease pain and increase function to address rehab goals per plan of care            PLAN     Upgrade activities as tolerated       Continue plan of care    Update interventions per flow sheet         Discharge due to:_    Other:_      Omah Dewalt PTA  09/04/2016  2:30 PM

## 2016-09-05 ENCOUNTER — Inpatient Hospital Stay: Admit: 2016-10-16 | Payer: MEDICARE | Primary: Internal Medicine

## 2016-09-05 DIAGNOSIS — Z853 Personal history of malignant neoplasm of breast: Secondary | ICD-10-CM

## 2016-09-06 ENCOUNTER — Inpatient Hospital Stay: Admit: 2016-09-06 | Payer: MEDICARE | Primary: Internal Medicine

## 2016-09-06 LAB — CBC WITH AUTOMATED DIFF
ABS. BASOPHILS: 0 10*3/uL (ref 0.0–0.2)
ABS. EOSINOPHILS: 0.1 10*3/uL (ref 0.0–0.4)
ABS. IMM. GRANS.: 0 10*3/uL (ref 0.0–0.1)
ABS. MONOCYTES: 0.4 10*3/uL (ref 0.1–0.9)
ABS. NEUTROPHILS: 3.8 10*3/uL (ref 1.4–7.0)
Abs Lymphocytes: 1.2 10*3/uL (ref 0.7–3.1)
BASOPHILS: 0 %
EOSINOPHILS: 2 %
HCT: 28.5 % — ABNORMAL LOW (ref 34.0–46.6)
HGB: 8.8 g/dL — ABNORMAL LOW (ref 11.1–15.9)
IMMATURE GRANULOCYTES: 0 %
Lymphocytes: 21 %
MCH: 26.5 pg — ABNORMAL LOW (ref 26.6–33.0)
MCHC: 30.9 g/dL — ABNORMAL LOW (ref 31.5–35.7)
MCV: 86 fL (ref 79–97)
MONOCYTES: 8 %
NEUTROPHILS: 69 %
PLATELET: 290 10*3/uL (ref 150–379)
RBC: 3.32 x10E6/uL — ABNORMAL LOW (ref 3.77–5.28)
RDW: 15.3 % (ref 12.3–15.4)
WBC: 5.6 10*3/uL (ref 3.4–10.8)

## 2016-09-06 LAB — RETICULOCYTE COUNT: Reticulocyte count: 1.5 % (ref 0.6–2.6)

## 2016-09-06 LAB — HAPTOGLOBIN: Haptoglobin: 412 mg/dL — ABNORMAL HIGH (ref 34–200)

## 2016-09-06 LAB — LD: LD: 192 IU/L (ref 119–226)

## 2016-09-06 NOTE — Progress Notes (Signed)
PT ONCOLOGY EVAL/DAILY NOTE    Patient Name: Jillian Bean  Date:09/06/2016  DOB: 08-Aug-1941    Patient DOB Verified  Payor: [place] MEDICARE / Plan: [place] MEDICARE PART A & B / Product Type: Medicare /    In time:10:55a  Out time:11:55a  Total Treatment Time (min): 60  Total Timed Codes (min): 60  1:1 Treatment Time ([place] only): 60   Visit #: 8 of 24    Treatment Area: Unspecified osteoarthritis, unspecified site [M19.90]  Radiculopathy, lumbar region [M54.16]    SUBJECTIVE  Pain Level (0-10 scale): 1 knees  Any medication changes, allergies to medications, adverse drug reactions, diagnosis change, or new procedure performed?:  No     Yes (see summary sheet for update)  Subjective functional status/changes:    No changes reported  Patient reports she has been having pain down her lateral RLE that comes and goes and was really bad yesterday as she was unable to take her medications due to getting blood work drawn.   ??  OBJECTIVE    45 min Therapeutic Exercise:   See flow sheet :   Rationale: increase ROM and increase strength to improve the patient???s ability to perform ADL's required for return to work and/or community.    15 min Neuromuscular Re-education:    See flow sheet :   Rationale: increase strength, improve coordination and improve balance  to improve the patient???s ability to incorporate correct movement patterns into functional ADL's    With    TE    TA    neuro    other: Patient Education:  Review HEP     Progressed/Changed HEP based on: adjustment made to supine piriformis stretch to prevent B knee pain.   positioning    body mechanics    transfers    heat/ice application     other:      Other Objective/Functional Measures:   SLS: no UE for 10 seconds bilaterally before needing to "touch down"  Step ups: No UE bilaterally, increased fatigue noted on RLE  Foam stance: 1-2 LOB L toe to R heel with increased fatigue     Pain Level (0-10 scale) post treatment: 1      ASSESSMENT/Changes in Function: Patient demonstrates improved tolerance with step ups and lateral steps up and will benefit from advancement at next visit. No knee pain with form correction with interventions.     Patient will benefit from skilled PT services to modify and progress therapeutic interventions, address functional mobility deficits, address ROM deficits, address strength deficits, analyze and address soft tissue restrictions, analyze and cue movement patterns, analyze and modify body mechanics/ergonomics, address imbalance/dizziness and decrease pain and increase function to address rehab goals per plan of care            PLAN     Upgrade activities as tolerated       Continue plan of care    Update interventions per flow sheet         Discharge due to:_    Other:_      Balin Vandegrift PTA  09/06/2016  11:30 AM

## 2016-09-10 ENCOUNTER — Encounter

## 2016-09-11 ENCOUNTER — Inpatient Hospital Stay: Admit: 2016-09-11 | Payer: MEDICARE | Primary: Internal Medicine

## 2016-09-11 NOTE — Progress Notes (Signed)
PT ONCOLOGY EVAL/DAILY NOTE    Patient Name: Jillian Bean  Date:09/11/2016  DOB: Jul 21, 1941    Patient DOB Verified  Payor: [place] MEDICARE / Plan: VA MEDICARE PART A & B / Product Type: Medicare /    In time:9:50a  Out time: 10:50a  Total Treatment Time (min): 60  Total Timed Codes (min): 60  1:1 Treatment Time ([place] only): 60   Visit #: 9 of 24    Treatment Area: Unspecified osteoarthritis, unspecified site [M19.90]  Radiculopathy, lumbar region [M54.16]    SUBJECTIVE  Pain Level (0-10 scale): 1 knees  Any medication changes, allergies to medications, adverse drug reactions, diagnosis change, or new procedure performed?:  No     Yes (see summary sheet for update)  Subjective functional status/changes:    No changes reported  Patient reports she is going to see her MD on 2/18.  Patient states her knees get a little bothered with seated hamstring stretch, but other than that everything is going well and she has not had any issue since last session.   ??  OBJECTIVE    45 min Therapeutic Exercise:   See flow sheet :   Rationale: increase ROM and increase strength to improve the patient???s ability to perform ADL's required for return to work and/or community.    15 min Neuromuscular Re-education:    See flow sheet :   Rationale: increase strength, improve coordination and improve balance  to improve the patient???s ability to incorporate correct movement patterns into functional ADL's    With    TE    TA    neuro    other: Patient Education:  Review HEP     Progressed/Changed HEP based on: adjustment made to supine piriformis stretch to prevent B knee pain.   positioning    body mechanics    transfers    heat/ice application     other:      Other Objective/Functional Measures: SLS: 2-3 LOB RLE, 1-2 LOB LLE, min pain with lateral step ups, after correcting form no pain    Pain Level (0-10 scale) post treatment: 0    ASSESSMENT/Changes in Function: Patient has made great progress with neuro  re-education exercises and overall strength and endurance. Patient continues to have difficulty with descending stairs at home, but has improved with ascending step tolerance BLE.    Patient will benefit from skilled PT services to modify and progress therapeutic interventions, address functional mobility deficits, address ROM deficits, address strength deficits, analyze and address soft tissue restrictions, analyze and cue movement patterns, analyze and modify body mechanics/ergonomics, address imbalance/dizziness and decrease pain and increase function to address rehab goals per plan of care            PLAN     Upgrade activities as tolerated       Continue plan of care    Update interventions per flow sheet         Discharge due to:_    Other:_      Kamryn Gauthier PTA  09/11/2016  11:30 AM

## 2016-09-13 ENCOUNTER — Inpatient Hospital Stay
Admit: 2016-09-13 | Payer: MEDICARE | Attending: Rehabilitative and Restorative Service Providers" | Primary: Internal Medicine

## 2016-09-13 DIAGNOSIS — M1990 Unspecified osteoarthritis, unspecified site: Secondary | ICD-10-CM

## 2016-09-13 NOTE — Progress Notes (Signed)
PT ONCOLOGY DAILY NOTE    Patient Name: Jillian Bean  Date:09/13/2016  DOB: 02-20-1941    Patient DOB Verified  Payor: [place] MEDICARE / Plan: VA MEDICARE PART A & B / Product Type: Medicare /    In time:11:00  Out time:12:00  Total Treatment Time (min): 60  Total Timed Codes (min): 60  1:1 Treatment Time ([facility] only): 60   Visit #: 10 of 24    Treatment Area: Unspecified osteoarthritis, unspecified site [M19.90]  Radiculopathy, lumbar region [M54.16]    SUBJECTIVE  Pain Level (0-10 scale): 0  Any medication changes, allergies to medications, adverse drug reactions, diagnosis change, or new procedure performed?:  No     Yes (see summary sheet for update)  Subjective functional status/changes:    No changes reported  Pt is pleased with her progress and would like to continue on her own with her prescribed HEP and sessions at her gym as discussed      OBJECTIVE         60 min Therapeutic Exercise:   See flow sheet :   Rationale: increase ROM, increase strength and improve balance to improve the patient???s ability to perform ADL's required for return to work and/or community             With    TE    TA    neuro    other: Patient Education:  Review HEP     Progressed/Changed HEP based on:    positioning    body mechanics    transfers    heat/ice application     other:           Pain Level (0-10 scale) post treatment: 0    ASSESSMENT/Changes in Function: Pt has completed all goals.  D/C to HEP.             Goals:  See Progress Note 09/13/2016  PLAN    Upgrade activities as tolerated       Continue plan of care    Update interventions per flow sheet         Discharge due to:all goals met    Other:_      Velora Heckler Nestor Wieneke MSPT, CLT-LANA 09/13/2016  10:56 AM

## 2016-09-13 NOTE — Progress Notes (Signed)
[facility] [place], [place]  [place], [place]  Phone: [phone] Fax: [phone]      Discharge Summary 2-15    Patient name: Jillian Bean  DOB: 1941-08-04  Provider#: [id number]  Referral source: Angelina Pih, NP      Medical/Treatment Diagnosis: Unspecified osteoarthritis, unspecified site [M19.90]  Radiculopathy, lumbar region [M54.16]     Prior Hospitalization: see medical history     Comorbidities: See Plan of Care  Prior Level of Function: See Plan of Care  Medications: Verified on Patient Summary List    Start of Care: 08/14/2016      Onset Date:01/20/2012   Visits from Start of Care: 10     Missed Visits: 0  Reporting Period : 08/14/2016 to 09/13/2016    Assessment/Summary of care: Pt has met all rehab goals. D/C to HEP and program at her gym within discussed parameters    GOAL STATUS:  1) Pt will be Independent with skin care routine to decrease risk of infection.MET  2) Pt will be Independent in don/doff/wearing schedule of light spinal compression. MET  3) Pt will know which signs and symptoms to report immediately to physician concerning their condition.MET  4) Pt will have increased LE strength by 1-2 levels on MMT in order to decrease fall risk, navigate 6-12 steps and increase mobility to return to church and community activities.MET  5) Pt will have improved SLS scores to 10 secs or > and improved TUG score with appropriate a.d to <11 secs in order to improve safe mobility and decrease fall riskMET  6) Pt will be Independent with HEP for strengthening, and motion exercises in order to maintain gains achieved in therapyMET  7) Pt will utilize correct breath and movement patterns during all ADL's involving ambulation.MET  8) Pt will have decreased pain by 2-3 levels on NPS in order to return to church and community activitiesMET      G-Codes (GP)  Mobility  [phone] Current  CJ= 20-39%  [id number] Goal  CJ= 20-39% G8980 D/C  CJ= 20-39%      The severity rating is based on clinical judgment and the FOTO Score and Other Objective assessments of strength, motion balance/gait score.    RECOMMENDATIONS:  Discontinue therapy: Patient has reached or is progressing toward set goals     Patient is non-compliant or has abdicated     Due to lack of appreciable progress towards set goals     Other  [place], CLT-LANA 09/18/2016   2:37 PM

## 2016-09-20 ENCOUNTER — Inpatient Hospital Stay: Admit: 2016-09-20 | Payer: MEDICARE | Attending: Nurse Practitioner | Primary: Internal Medicine

## 2016-09-20 DIAGNOSIS — R948 Abnormal results of function studies of other organs and systems: Secondary | ICD-10-CM

## 2016-09-20 MED ORDER — IOPAMIDOL 76 % IV SOLN
370 mg iodine /mL (76 %) | Freq: Once | INTRAVENOUS | Status: AC
Start: 2016-09-20 — End: 2016-09-20
  Administered 2016-09-20: 14:00:00 via INTRAVENOUS

## 2016-09-20 MED FILL — ISOVUE-370  76 % INTRAVENOUS SOLUTION: 370 mg iodine /mL (76 %) | INTRAVENOUS | Qty: 100

## 2016-09-25 ENCOUNTER — Ambulatory Visit: Admit: 2016-09-25 | Discharge: 2016-09-25 | Payer: MEDICARE | Attending: Nurse Practitioner | Primary: Internal Medicine

## 2016-09-25 ENCOUNTER — Encounter

## 2016-09-25 DIAGNOSIS — C50911 Malignant neoplasm of unspecified site of right female breast: Secondary | ICD-10-CM

## 2016-09-25 NOTE — Progress Notes (Signed)
[facility]  [address] [place]   [address]  W: [phone]   F: [phone]      F/u HEME/ONC CONSULT    Reason for visit: evaluation for treatment for breast cancer    Consulting physician: Dr. Gilford Rile    HPI:   Jillian Bean is a 76 y.o.  female who I was asked to see in consultation at the request of Dr. Elie Confer for evaluation for systemic therapy for breast cancer.    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 Right lumpectomy on 11/10/14 shows IDC, 1.7 cm, gr 3, 0/5 LN, no LVI.  PT1cN0Mx.    TCH q 3 weeks x 6: 12/21/14- 04/12/15  C#5 held 1 week on 03/15/15 due to thrombocytopenia   Outback Herceptin: 05/03/15-11/29/15    S/p XRT 06/03/15    Interval History: In today for follow up. Complains of gr 1 constipation, gr 1 hair loss, gr 1 pain to back/right leg.     07/25/16 hgb 8.7  06/20/16 hgb 11.5    DX   Encounter Diagnoses   Name Primary?   ??? Malignant neoplasm of right female breast, unspecified estrogen receptor status, unspecified site of breast ([place]) Yes   ??? Osteoporosis, unspecified osteoporosis type, unspecified pathological fracture presence    ??? Acute bilateral low back pain with sciatica, sciatica laterality unspecified    ??? Anemia, normocytic normochromic      Past Medical History:   Diagnosis Date   ??? Back pain 09/01/2007   ??? Cancer ([place])     R breast   ??? Coagulation disorder ([place])     IRON DEFFICIENCY ANEMIA   ??? Colonic polyps 08/31/1998   ??? 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 Hiatal hernia 07/11/2011    Dr.sobieski   ??? Hypercholesteremia 09/01/2007   ??? Hypothyroidism 09/01/2003   ??? 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 Nausea & vomiting    ??? OA (osteoarthritis) 09/01/2007   ??? Other ill-defined conditions(799.89)     VERTIGO    ??? Plantar fasciitis 09/01/2007   ??? S/P colonoscopy 10/10/2007   ??? Thyroid disease      Past Surgical History:   Procedure Laterality Date   ??? BREAST SURGERY PROCEDURE UNLISTED  1993    Breast Reduction   ??? ENDOSCOPY, COLON, DIAGNOSTIC  7/12/18/08    7/05 Dr Maudry Diego   ??? HX GI      COLONOSCOPY   ??? HX GYN      Hysterectomy in her 52's   ??? HX HEENT  04/07/10    thyroid biopsy neg dr Leward Quan   ??? HX HEENT      wisdom teeth extraction   ??? HX ORTHOPAEDIC  08/22/09    left TKR   ??? HX ORTHOPAEDIC  08/13/12    right tkr   ??? HX OTHER SURGICAL      MINIMALLY INVASIVE LUMBAR DECOMPRESSION     Social History     Social History   ??? Marital status: MARRIED     Spouse name: N/A   ??? Number of children: N/A   ??? Years of education: N/A     Social History Main Topics   ??? Smoking status:  Never Smoker   ??? Smokeless tobacco: Never Used   ??? Alcohol use No   ??? Drug use: No   ??? Sexual activity: Not Currently     Other Topics Concern   ??? None     Social History Narrative     Family History   Problem Relation Age of Onset   ??? Cancer Mother      Breast   ??? Heart Failure Father      MI   ??? Seizures Brother      ms       Current Outpatient Prescriptions   Medication Sig Dispense Refill   ??? diclofenac EC (VOLTAREN) 50 mg EC tablet Take 1 Tab by mouth two (2) times daily as needed. 60 Tab 5   ??? raNITIdine (ZANTAC) 150 mg tablet Take 1 Tab by mouth two (2) times a day. 60 Tab 5   ??? alendronate (FOSAMAX) 70 mg tablet Take 1 Tab by mouth every seven (7) days. 4 Tab 11   ??? colesevelam (WELCHOL) 625 mg tablet 1 po bid 180 Tab 2   ??? levothyroxine (SYNTHROID) 112 mcg tablet 8 per week 100 Tab 3   ??? acetaminophen (TYLENOL) 500 mg tablet Take  by mouth every six (6) hours as needed for Pain.     ??? docusate sodium (COLACE) 100 mg capsule Take 100 mg by mouth daily.     ??? latanoprost (XALATAN) 0.005 % ophthalmic solution Administer  to both eyes nightly.  6   ??? aspirin 81 mg chewable tablet Take 81 mg by mouth daily.      ??? multivitamin (ONE A DAY) tablet Take 1 Tab by mouth daily.     ??? cholecalciferol, vitamin d3, (VITAMIN D) 1,000 unit tablet Take  by mouth daily.       Allergies   Allergen Reactions   ??? Atorvastatin Myalgia   ??? Zocor [Simvastatin] Other (comments) and Diarrhea     Other reaction(s): Adverse reaction to substance   Myalgias     ??? Celebrex [Celecoxib] Other (comments)     Aggitation   ??? Gabapentin Other (comments)     FELT UNSTEADY ON MY FEET   ??? Levaquin [Levofloxacin] Palpitations   ??? Nexium [Esomeprazole Magnesium] Diarrhea   ??? Oxycontin [Oxycodone] Itching   ??? Pravastatin Nausea and Vomiting   ??? Yellow Dye Hives       Review of Systems    A comprehensive review of systems was performed and all systems were negative except for HPI and for the symptom report form, reviewed and scanned in.    Objective:  Physical Exam:  Visit Vitals   ??? BP 128/50   ??? Pulse 72   ??? Temp 97.9 ??F (36.6 ??C) (Temporal)   ??? Resp 18   ??? Ht '5\' 5"'  (1.651 m)   ??? Wt 145 lb (65.8 kg)   ??? LMP 04/13/2010   ??? SpO2 99%   ??? BMI 24.13 kg/m2       General:  Alert, cooperative, no distress, appears stated age.   Head:  Normocephalic, without obvious abnormality, atraumatic.   Eyes:  Conjunctivae/corneas clear. PERRL, EOMs intact.   Throat: Lips, mucosa, and tongue normal.    Neck: Supple, symmetrical, trachea midline, no adenopathy, thyroid: no enlargement/tenderness/nodules   Back:   Symmetric, no curvature. ROM normal. No CVA tenderness.   Lungs:   Clear to auscultation bilaterally.   Chest wall:  No tenderness or deformity.   Heart:  Regular rate and  rhythm, S1, S2 normal, no murmur, click, rub or gallop.   Breast Exam:  S/p R lumpectomy.    Abdomen:   Soft, non-tender. Bowel sounds normal. No masses,  No organomegaly.   Extremities: Extremities normal, atraumatic, no cyanosis or edema.   Skin: Improved macular rash on elbows, back, abdomen lower, upper thighs, back of legs   Lymph nodes: Cervical, supraclavicular, and axillary nodes normal.    Neurologic: CNII-XII intact.     Diagnostic Imaging     12/16/14 TTE: EF 71%.  03/02/15 TTE: EF 68%  05/30/15 TTE :  EF 65%  07/21/15 TTE: EF 66%  10/06/15 TTE: EF 62%  05/23/16 TTE EF 60%    03/15/16 dexa  Findings:  ????  Fractures identified on Lateral scanogram:  None  ????  Femoral Neck:  Right  Bone mineral density (gm/cm2):? 0.649  % of peak bone mass: 63  % for age matched controls:? 86  T-score: -2.8  Z-score: -0.8  ????  Total Hip: Right  Bone mineral density (gm/cm2):  0.761  % of peak bone mass:   76  % for age matched controls:  98  T-score:   -2.0  Z-score:  -0.1  ??  Lumbar Spine:  L1-L4  Bone mineral density (gm/cm2):  1.211  % of peak bone mass:  101   % for age matched controls:  124  T-score:  0.1  Z-score:  2.0  ??  The T score for the left distal third radius is -1.4.  ????  IMPRESSION  Impression:  ????  This patient is osteoporotic using the World Health Organization criteria  As compared to the prior study, there has been no change in the bone mineral  density of the right total hip and an increase in the bone mineral density of  the lumbar spine of 2.0%.  10 year probability of major osteoporotic fracture:  19.6%  10 year probability of hip fracture:  7.4%    09/20/16 CT c/a/p:  IMPRESSION:  No acute process in the chest, abdomen, and pelvis.  ??    Lab Results  Lab Results   Component Value Date/Time    WBC 5.6 09/05/2016 09:40 AM    HGB 8.8 (L) 09/05/2016 09:40 AM    HCT 28.5 (L) 09/05/2016 09:40 AM    PLATELET 290 09/05/2016 09:40 AM    MCV 86 09/05/2016 09:40 AM     Lab Results   Component Value Date/Time    Sodium 143 07/25/2016 02:57 PM    Potassium 4.1 07/25/2016 02:57 PM    Chloride 102 07/25/2016 02:57 PM    CO2 24 07/25/2016 02:57 PM    Anion gap 7 02/22/2015 09:56 AM    Glucose 86 07/25/2016 02:57 PM    BUN 21 07/25/2016 02:57 PM    Creatinine 0.70 07/25/2016 02:57 PM    BUN/Creatinine ratio 30 (H) 07/25/2016 02:57 PM    GFR est AA 97 07/25/2016 02:57 PM    GFR est non-AA 84 07/25/2016 02:57 PM     Calcium 9.1 07/25/2016 02:57 PM    AST (SGOT) 14 07/25/2016 02:57 PM    Alk. phosphatase 104 07/25/2016 02:57 PM    Protein, total 6.9 07/25/2016 02:57 PM    Albumin 4.0 07/25/2016 02:57 PM    Globulin 2.5 04/12/2015 09:23 AM    A-G Ratio 1.4 07/25/2016 02:57 PM    ALT (SGPT) 14 07/25/2016 02:57 PM       Lab Results   Component Value Date/Time  Reticulocyte count 1.5 09/05/2016 09:40 AM    Iron % saturation 22 07/30/2016 09:31 AM    TIBC 161 (L) 07/30/2016 09:31 AM    Ferritin 1982 (H) 07/30/2016 09:31 AM    Vitamin B12 542 07/30/2016 09:31 AM    Folate 14.8 07/30/2016 09:31 AM    Haptoglobin 412 (H) 09/05/2016 09:40 AM    LD 192 09/05/2016 09:40 AM    TSH 2.560 07/25/2016 02:57 PM     Lab Results   Component Value Date/Time    INR 1.5 (H) 09/05/2012 03:53 AM    aPTT 28.7 08/18/2009 01:05 PM       Assessment/Plan:  76 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  PS 0    1. Right Breast cancer stage: IA    Hormonal therapy: not indicated due to receptor (-) status    We explained to the patient that the goal of systemic adjuvant therapy is to improve the chances for cure and decrease the risk of relapse. We explained why a patient can have microscopic cancer spread now even though physical examination, laboratory studies and imaging studies are negative for cancer. We explained that the same treatments used now as adjuvant or preventive treatments rarely if ever are curative in women who develop metastases.     No evidence of recurrence    Mammogram in March 2017 negative at [facility], due in March 2018.    She declined to take neratinib.    2. Emotional well being: She has excellent support and is coping well with her disease.    3. FH of breast cancer: BRCA 1/2 [place] testing negative    4. Osteoporosis: On dexa from 2015; is now on fosamax from Dr. Wanda Plump, started 08/2016. DEXA 03/15/16 showed some improvement     5. Back pain low/radiculopathy: Dr. Dema Severin, pain specialist, was following her. NM bone scan and xr show degeneration. Steroid injection in of Octobert; L-spine MRI 01/19/16 shows DDD.  States she had another steroid injection 09/24/16.  Next due in 2 weeks.     6. Anemia: Hgb 8.8. New; Normocytic; unclear as to drop since Nov. Elevated ferritin as well.  No B12 def, normal folate, normal iron studies.  Normal TSH    The differential diagnosis for a normocytic anemia is broad, and includes blood loss, anemia of chronic disease, chronic renal insufficiency, iron deficiency, hypothyroidism, hemolysis, and bone marrow suppression.  MDS, B12 deficiency, folate deficiency, and etoh abuse can also lead to anemia, though it is generally macrocytic.      Since her anemia is still present and ferritin is elevated, I recommend a bone marrow bx first since the other labs were unremarkable. If her bone marrow bx is negative, will refer to GI.    Will see her back in 2 weeks after bone marrow bx.    Last EGD and colonoscopy in 2013.      Thank you for this consult.  All of the patient's questions were answered today.        Follow-up Disposition:  Return for 2 week fu, Jillian Bean.    Sonda Rumble, MD

## 2016-09-25 NOTE — Progress Notes (Signed)
Jillian Bean is a 76 y.o. female here for follow-up of breast cancer.

## 2016-10-02 ENCOUNTER — Inpatient Hospital Stay: Admit: 2016-10-02 | Payer: MEDICARE | Attending: Specialist | Primary: Internal Medicine

## 2016-10-02 ENCOUNTER — Ambulatory Visit

## 2016-10-02 DIAGNOSIS — D649 Anemia, unspecified: Secondary | ICD-10-CM

## 2016-10-02 LAB — CBC WITH AUTOMATED DIFF
ABS. BASOPHILS: 0 10*3/uL (ref 0.0–0.1)
ABS. EOSINOPHILS: 0.1 10*3/uL (ref 0.0–0.4)
ABS. IMM. GRANS.: 0.1 10*3/uL — ABNORMAL HIGH (ref 0.00–0.04)
ABS. LYMPHOCYTES: 1.2 10*3/uL (ref 0.8–3.5)
ABS. MONOCYTES: 0.6 10*3/uL (ref 0.0–1.0)
ABS. NEUTROPHILS: 5.3 10*3/uL (ref 1.8–8.0)
ABSOLUTE NRBC: 0 10*3/uL (ref 0.00–0.01)
BASOPHILS: 0 % (ref 0–1)
EOSINOPHILS: 1 % (ref 0–7)
HCT: 30.6 % — ABNORMAL LOW (ref 35.0–47.0)
HGB: 9.3 g/dL — ABNORMAL LOW (ref 11.5–16.0)
IMMATURE GRANULOCYTES: 1 % — ABNORMAL HIGH (ref 0.0–0.5)
LYMPHOCYTES: 16 % (ref 12–49)
MCH: 27.5 PG (ref 26.0–34.0)
MCHC: 30.4 g/dL (ref 30.0–36.5)
MCV: 90.5 FL (ref 80.0–99.0)
MONOCYTES: 8 % (ref 5–13)
MPV: 8.6 FL — ABNORMAL LOW (ref 8.9–12.9)
NEUTROPHILS: 73 % (ref 32–75)
NRBC: 0 PER 100 WBC
PLATELET: 250 10*3/uL (ref 150–400)
RBC: 3.38 M/uL — ABNORMAL LOW (ref 3.80–5.20)
RDW: 15.5 % — ABNORMAL HIGH (ref 11.5–14.5)
WBC: 7.3 10*3/uL (ref 3.6–11.0)

## 2016-10-02 MED ORDER — FENTANYL CITRATE (PF) 50 MCG/ML IJ SOLN
50 mcg/mL | INTRAMUSCULAR | Status: DC | PRN
Start: 2016-10-02 — End: 2016-10-02
  Administered 2016-10-02 (×3): via INTRAVENOUS

## 2016-10-02 MED ORDER — MIDAZOLAM 1 MG/ML IJ SOLN
1 mg/mL | INTRAMUSCULAR | Status: DC | PRN
Start: 2016-10-02 — End: 2016-10-02
  Administered 2016-10-02 (×3): via INTRAVENOUS

## 2016-10-02 MED ORDER — LIDOCAINE (PF) 10 MG/ML (1 %) IJ SOLN
10 mg/mL (1 %) | Freq: Once | INTRAMUSCULAR | Status: AC
Start: 2016-10-02 — End: 2016-10-02
  Administered 2016-10-02: 16:00:00 via INTRAVENOUS

## 2016-10-02 MED FILL — FENTANYL CITRATE (PF) 50 MCG/ML IJ SOLN: 50 mcg/mL | INTRAMUSCULAR | Qty: 2

## 2016-10-02 MED FILL — MIDAZOLAM 1 MG/ML IJ SOLN: 1 mg/mL | INTRAMUSCULAR | Qty: 5

## 2016-10-02 MED FILL — LIDOCAINE (PF) 10 MG/ML (1 %) IJ SOLN: 10 mg/mL (1 %) | INTRAMUSCULAR | Qty: 10

## 2016-10-02 NOTE — Progress Notes (Addendum)
Pt brought back to [facility] for scheduled CT guided bone marrow biopsy.  Pt is alert and oriented x4.  She is accompanied by her husband Ron.  [phone] - Consent signed and allergies reviewed  0910 - IV started in left AC, Labs collected ([place])  [phone] - VS taken (see doc flow), S1S2, Bilateral BS CTA  0924 - Pt resting  0941 - Pt's husband brought back to [facility] to be present during discussion with MD  1000 Dorene Grebe, MD in [facility] discussing procedure and sedation plan with Pt, her husband and RN.  1004 - Pt's husband escorted back to waiting area  1016 - Procedure "delay" due to CT availability.  1020 - Pt taken on stretcher to CT.  Procedure started at 1043 hrs and ended at 1048 hrs.  Pt received a total of 2.5 mg of Versed and 62.5 mcg of Fentanyl over the course of the procedure.  Procedure site covered with gauze and transparent dressing.  VS monitored (see doc flow)  1057 - Pt back in [facility] and is sleepy but easily aroused.  Pt VS being monitored  1059 - Pt Access called and requested to notify Pt's husband that the procedure is completed and Pt is in recovery in [facility].  1114 - Pt sleeping.  VS being monitored.  1133 - Pt resting, drinking fluids, eating crackers, Pain 0/10 from procedure.  1148 - Reviewed discharge instructions with Pt and husband and provided copy of AVS.  Pt able to verbalize understanding.  Opportunity for questions/clarification  1155 - IV removed and pressure dressing applied.  Pt able to ambulate to the BR and void.  Pt assisted with dressing.  Procedure site c/d/i.   Pt's coat was accidentally taken home by another Pt.  The other Pt has been called and will be returning with the coat.  1235 - Pt taken in wheel chair to vehicle/husband.

## 2016-10-02 NOTE — H&P (Signed)
Radiology History and Physical    Patient: Jillian Bean 76 y.o. female       Chief Complaint: No chief complaint on file.      History of Present Illness: anemia     History:    Past Medical History:   Diagnosis Date   ??? Back pain 09/01/2007   ??? Cancer ([facility])     R breast   ??? Coagulation disorder ([place])     IRON DEFFICIENCY ANEMIA   ??? Colonic polyps 08/31/1998   ??? 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 Hiatal hernia 07/11/2011    Dr.sobieski   ??? Hypercholesteremia 09/01/2007   ??? Hypothyroidism 09/01/2003   ??? 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 Nausea & vomiting    ??? OA (osteoarthritis) 09/01/2007   ??? Other ill-defined conditions(799.89)     VERTIGO   ??? Plantar fasciitis 09/01/2007   ??? S/P colonoscopy 10/10/2007   ??? Thyroid disease      Family History   Problem Relation Age of Onset   ??? Cancer Mother      Breast   ??? Heart Failure Father      MI   ??? Seizures Brother      ms     Social History     Social History   ??? Marital status: MARRIED     Spouse name: N/A   ??? Number of children: N/A   ??? Years of education: N/A     Occupational History   ??? Not on file.     Social History Main Topics   ??? Smoking status: Never Smoker   ??? Smokeless tobacco: Never Used   ??? Alcohol use No   ??? Drug use: No   ??? Sexual activity: Not Currently     Other Topics Concern   ??? Not on file     Social History Narrative       Allergies:   Allergies   Allergen Reactions   ??? Atorvastatin Myalgia   ??? Zocor [Simvastatin] Other (comments) and Diarrhea     Other reaction(s): Adverse reaction to substance   Myalgias     ??? Celebrex [Celecoxib] Other (comments)     Aggitation   ??? Gabapentin Other (comments)     FELT UNSTEADY ON MY FEET   ??? Levaquin [Levofloxacin] Palpitations   ??? Nexium [Esomeprazole Magnesium] Diarrhea   ??? Oxycontin [Oxycodone] Itching   ??? Pravastatin Nausea and Vomiting   ??? Yellow Dye Hives       Current Medications:   Current Outpatient Prescriptions   Medication Sig   ??? raNITIdine (ZANTAC) 150 mg tablet Take 1 Tab by mouth two (2) times a day.   ??? alendronate (FOSAMAX) 70 mg tablet Take 1 Tab by mouth every seven (7) days.   ??? colesevelam (WELCHOL) 625 mg tablet 1 po bid   ??? levothyroxine (SYNTHROID) 112 mcg tablet 8 per week   ??? acetaminophen (TYLENOL) 500 mg tablet Take  by mouth every six (6) hours as needed for Pain.   ??? docusate sodium (COLACE) 100 mg capsule Take 100 mg by mouth daily.   ??? latanoprost (XALATAN) 0.005 % ophthalmic solution Administer  to both eyes nightly.   ??? aspirin 81 mg chewable tablet Take 81 mg by mouth daily.   ??? multivitamin (ONE A DAY) tablet Take 1 Tab by mouth daily.   ??? cholecalciferol, vitamin  d3, (VITAMIN D) 1,000 unit tablet Take  by mouth daily.   ??? diclofenac EC (VOLTAREN) 50 mg EC tablet Take 1 Tab by mouth two (2) times daily as needed.     No current facility-administered medications for this encounter.         Physical Exam:  Blood pressure (P) 126/55, pulse (P) 68, resp. rate (P) 13, last menstrual period 04/13/2010, SpO2 (P) 100 %.  LUNG: clear to auscultation bilaterally, HEART: regular rate and rhythm, S1, S2 normal, no murmur, click, rub or gallop      Alerts:    Hospital Problems  Date Reviewed: 09/25/2016    None          Laboratory:      Recent Labs      10/02/16   0905   HGB  9.3*   HCT  30.6*   WBC  7.3   PLT  250         Plan of Care/Planned Procedure:  Risks, benefits, and alternatives reviewed with patient and she agrees to proceed with the procedure.     Plan is for CT bone marrow biopsy       Arther Abbott, MD

## 2016-10-05 ENCOUNTER — Inpatient Hospital Stay: Admit: 2016-12-19 | Payer: MEDICARE | Primary: Internal Medicine

## 2016-10-05 DIAGNOSIS — D649 Anemia, unspecified: Secondary | ICD-10-CM

## 2016-10-06 LAB — CBC W/O DIFF
HCT: 29.3 % — ABNORMAL LOW (ref 34.0–46.6)
HGB: 9.2 g/dL — ABNORMAL LOW (ref 11.1–15.9)
MCH: 26.6 pg (ref 26.6–33.0)
MCHC: 31.4 g/dL — ABNORMAL LOW (ref 31.5–35.7)
MCV: 85 fL (ref 79–97)
PLATELET: 272 10*3/uL (ref 150–379)
RBC: 3.46 x10E6/uL — ABNORMAL LOW (ref 3.77–5.28)
RDW: 16.5 % — ABNORMAL HIGH (ref 12.3–15.4)
WBC: 6 10*3/uL (ref 3.4–10.8)

## 2016-10-10 ENCOUNTER — Encounter: Attending: Specialist | Primary: Internal Medicine

## 2016-10-10 ENCOUNTER — Ambulatory Visit: Admit: 2016-10-10 | Discharge: 2016-10-10 | Payer: MEDICARE | Attending: Nurse Practitioner | Primary: Internal Medicine

## 2016-10-10 ENCOUNTER — Encounter

## 2016-10-10 DIAGNOSIS — C50911 Malignant neoplasm of unspecified site of right female breast: Secondary | ICD-10-CM

## 2016-10-10 NOTE — Progress Notes (Signed)
Jillian Bean is a 76 y.o. female  Chief Complaint   Patient presents with   ??? Abnormal Lab Results     bone marrow     Took b/p twice

## 2016-10-10 NOTE — Progress Notes (Signed)
[facility]  [address] [place]   [address]  W: [phone]   F: [phone]      F/u HEME/ONC CONSULT    Reason for visit: evaluation for treatment for breast cancer    Consulting physician: Dr. Gilford Rile    HPI:   Jillian Bean is a 76 y.o.  female who I was asked to see in consultation at the request of Dr. Elie Confer for evaluation for systemic therapy for breast cancer.    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 Right lumpectomy on 11/10/14 shows IDC, 1.7 cm, gr 3, 0/5 LN, no LVI.  PT1cN0Mx.    [place] q 3 weeks x 6: 12/21/14- 04/12/15  C#5 held 1 week on 03/15/15 due to thrombocytopenia   Outback Herceptin: 05/03/15-11/29/15    S/p XRT 06/03/15    Bone Marrow Bx 10/09/16: Variably normocellular marrow with trilineage hematopoiesis.    Interval History: In today for follow up. Complains of gr gr 1 fatigue, gr 1 pain to back/leg    07/25/16 hgb 8.7  06/20/16 hgb 11.5    DX   Encounter Diagnoses   Name Primary?   ??? Malignant neoplasm of right female breast, unspecified estrogen receptor status, unspecified site of breast ([place]) Yes   ??? Anemia, normocytic normochromic    ??? Osteoporosis, unspecified osteoporosis type, unspecified pathological fracture presence    ??? Acute bilateral low back pain with sciatica, sciatica laterality unspecified      Past Medical History:   Diagnosis Date   ??? Back pain 09/01/2007   ??? Cancer (HCC)     R breast   ??? Coagulation disorder ([place])     IRON DEFFICIENCY ANEMIA   ??? Colonic polyps 08/31/1998   ??? 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 Hiatal hernia 07/11/2011    Dr.sobieski   ??? Hypercholesteremia 09/01/2007   ??? Hypothyroidism 09/01/2003   ??? 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 Nausea & vomiting     ??? OA (osteoarthritis) 09/01/2007   ??? Other ill-defined conditions(799.89)     VERTIGO   ??? Plantar fasciitis 09/01/2007   ??? S/P colonoscopy 10/10/2007   ??? Thyroid disease      Past Surgical History:   Procedure Laterality Date   ??? BREAST SURGERY PROCEDURE UNLISTED  1993    Breast Reduction   ??? ENDOSCOPY, COLON, DIAGNOSTIC  7/12/18/08    7/05 Dr Maudry Diego   ??? HX GI      COLONOSCOPY   ??? HX GYN      Hysterectomy in her 22's   ??? HX HEENT  04/07/10    thyroid biopsy neg dr Leward Quan   ??? HX HEENT      wisdom teeth extraction   ??? HX ORTHOPAEDIC  08/22/09    left TKR   ??? HX ORTHOPAEDIC  08/13/12    right tkr   ??? HX OTHER SURGICAL      MINIMALLY INVASIVE LUMBAR DECOMPRESSION     Social History     Social History   ??? Marital status: MARRIED     Spouse name: N/A   ??? Number of children: N/A   ??? Years of education: N/A     Social  History Main Topics   ??? Smoking status: Never Smoker   ??? Smokeless tobacco: Never Used   ??? Alcohol use No   ??? Drug use: No   ??? Sexual activity: Not Currently     Other Topics Concern   ??? None     Social History Narrative     Family History   Problem Relation Age of Onset   ??? Cancer Mother      Breast   ??? Heart Failure Father      MI   ??? Seizures Brother      ms       Current Outpatient Prescriptions   Medication Sig Dispense Refill   ??? diclofenac EC (VOLTAREN) 50 mg EC tablet Take 1 Tab by mouth two (2) times daily as needed. 60 Tab 5   ??? raNITIdine (ZANTAC) 150 mg tablet Take 1 Tab by mouth two (2) times a day. 60 Tab 5   ??? alendronate (FOSAMAX) 70 mg tablet Take 1 Tab by mouth every seven (7) days. 4 Tab 11   ??? colesevelam (WELCHOL) 625 mg tablet 1 po bid 180 Tab 2   ??? levothyroxine (SYNTHROID) 112 mcg tablet 8 per week 100 Tab 3   ??? acetaminophen (TYLENOL) 500 mg tablet Take  by mouth every six (6) hours as needed for Pain.     ??? docusate sodium (COLACE) 100 mg capsule Take 100 mg by mouth daily.     ??? latanoprost (XALATAN) 0.005 % ophthalmic solution Administer  to both eyes nightly.  6    ??? aspirin 81 mg chewable tablet Take 81 mg by mouth daily.     ??? multivitamin (ONE A DAY) tablet Take 1 Tab by mouth daily.     ??? cholecalciferol, vitamin d3, (VITAMIN D) 1,000 unit tablet Take  by mouth daily.       Allergies   Allergen Reactions   ??? Atorvastatin Myalgia   ??? Zocor [Simvastatin] Other (comments) and Diarrhea     Other reaction(s): Adverse reaction to substance   Myalgias     ??? Celebrex [Celecoxib] Other (comments)     Aggitation   ??? Gabapentin Other (comments)     FELT UNSTEADY ON MY FEET   ??? Levaquin [Levofloxacin] Palpitations   ??? Nexium [Esomeprazole Magnesium] Diarrhea   ??? Oxycontin [Oxycodone] Itching   ??? Pravastatin Nausea and Vomiting   ??? Yellow Dye Hives       Review of Systems    A comprehensive review of systems was performed and all systems were negative except for HPI and for the symptom report form, reviewed and scanned in.    Objective:  Physical Exam:  Visit Vitals   ??? BP 129/51   ??? Pulse 68   ??? Temp 96.3 ??F (35.7 ??C) (Oral)   ??? Resp 18   ??? Ht '5\' 5"'  (1.651 m)   ??? Wt 140 lb 12.8 oz (63.9 kg)   ??? LMP 04/13/2010   ??? SpO2 99%   ??? BMI 23.43 kg/m2       General:  Alert, cooperative, no distress, appears stated age.   Head:  Normocephalic, without obvious abnormality, atraumatic.   Eyes:  Conjunctivae/corneas clear. PERRL, EOMs intact.   Throat: Lips, mucosa, and tongue normal.    Neck: Supple, symmetrical, trachea midline, no adenopathy, thyroid: no enlargement/tenderness/nodules   Back:   Symmetric, no curvature. ROM normal. No CVA tenderness.   Lungs:   Clear to auscultation bilaterally.   Chest wall:  No  tenderness or deformity.   Heart:  Regular rate and rhythm, S1, S2 normal, no murmur, click, rub or gallop.   Breast Exam:  S/p R lumpectomy.    Abdomen:   Soft, non-tender. Bowel sounds normal. No masses,  No organomegaly.   Extremities: Extremities normal, atraumatic, no cyanosis or edema.   Skin: Improved macular rash on elbows, back, abdomen lower, upper thighs, back of legs    Lymph nodes: Cervical, supraclavicular, and axillary nodes normal.   Neurologic: CNII-XII intact.     Diagnostic Imaging     12/16/14 TTE: EF 71%.  03/02/15 TTE: EF 68%  05/30/15 TTE :  EF 65%  07/21/15 TTE: EF 66%  10/06/15 TTE: EF 62%  05/23/16 TTE EF 60%    03/15/16 dexa  Findings:  ????  Fractures identified on Lateral scanogram:  None  ????  Femoral Neck:  Right  Bone mineral density (gm/cm2):? 0.649  % of peak bone mass: 63  % for age matched controls:? 86  T-score: -2.8  Z-score: -0.8  ????  Total Hip: Right  Bone mineral density (gm/cm2):  0.761  % of peak bone mass:   76  % for age matched controls:  98  T-score:   -2.0  Z-score:  -0.1  ??  Lumbar Spine:  L1-L4  Bone mineral density (gm/cm2):  1.211  % of peak bone mass:  101   % for age matched controls:  124  T-score:  0.1  Z-score:  2.0  ??  The T score for the left distal third radius is -1.4.  ????  IMPRESSION  Impression:  ????  This patient is osteoporotic using the World Health Organization criteria  As compared to the prior study, there has been no change in the bone mineral  density of the right total hip and an increase in the bone mineral density of  the lumbar spine of 2.0%.  10 year probability of major osteoporotic fracture:  19.6%  10 year probability of hip fracture:  7.4%    09/20/16 CT c/a/p:  IMPRESSION:  No acute process in the chest, abdomen, and pelvis.  ??    Lab Results  Lab Results   Component Value Date/Time    WBC 6.0 10/05/2016 09:10 AM    HGB 9.2 (L) 10/05/2016 09:10 AM    HCT 29.3 (L) 10/05/2016 09:10 AM    PLATELET 272 10/05/2016 09:10 AM    MCV 85 10/05/2016 09:10 AM     Lab Results   Component Value Date/Time    Sodium 143 07/25/2016 02:57 PM    Potassium 4.1 07/25/2016 02:57 PM    Chloride 102 07/25/2016 02:57 PM    CO2 24 07/25/2016 02:57 PM    Anion gap 7 02/22/2015 09:56 AM    Glucose 86 07/25/2016 02:57 PM    BUN 21 07/25/2016 02:57 PM    Creatinine 0.70 07/25/2016 02:57 PM    BUN/Creatinine ratio 30 (H) 07/25/2016 02:57 PM     GFR est AA 97 07/25/2016 02:57 PM    GFR est non-AA 84 07/25/2016 02:57 PM    Calcium 9.1 07/25/2016 02:57 PM    AST (SGOT) 14 07/25/2016 02:57 PM    Alk. phosphatase 104 07/25/2016 02:57 PM    Protein, total 6.9 07/25/2016 02:57 PM    Albumin 4.0 07/25/2016 02:57 PM    Globulin 2.5 04/12/2015 09:23 AM    A-G Ratio 1.4 07/25/2016 02:57 PM    ALT (SGPT) 14 07/25/2016 02:57 PM  Lab Results   Component Value Date/Time    Reticulocyte count 1.5 09/05/2016 09:40 AM    Iron % saturation 22 07/30/2016 09:31 AM    TIBC 161 (L) 07/30/2016 09:31 AM    Ferritin 1982 (H) 07/30/2016 09:31 AM    Vitamin B12 542 07/30/2016 09:31 AM    Folate 14.8 07/30/2016 09:31 AM    Haptoglobin 412 (H) 09/05/2016 09:40 AM    LD 192 09/05/2016 09:40 AM    TSH 2.560 07/25/2016 02:57 PM     Lab Results   Component Value Date/Time    INR 1.5 (H) 09/05/2012 03:53 AM    aPTT 28.7 08/18/2009 01:05 PM       Assessment/Plan:  76 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  PS 0    1. Right Breast cancer stage: IA    Hormonal therapy: not indicated due to receptor (-) status    We explained to the patient that the goal of systemic adjuvant therapy is to improve the chances for cure and decrease the risk of relapse. We explained why a patient can have microscopic cancer spread now even though physical examination, laboratory studies and imaging studies are negative for cancer. We explained that the same treatments used now as adjuvant or preventive treatments rarely if ever are curative in women who develop metastases.     No evidence of recurrence    Mammogram in March 2017 negative at [facility], due in March 2018.    She declined to take neratinib.    2. Emotional well being: She has excellent support and is coping well with her disease.    3. FH of breast cancer: BRCA 1/2 [place] testing negative    4. Osteoporosis: On dexa from 2015; is now on fosamax from Dr. Wanda Plump,  started 08/2016. DEXA 03/15/16 showed some improvement    5. Back pain low/radiculopathy: Dr. Dema Severin, pain specialist, was following her. NM bone scan and xr show degeneration. Steroid injection in of Octobert; L-spine MRI 01/19/16 shows DDD.  States she had another steroid injection 09/24/16.     6. Anemia: Hgb 9.2. New; Normocytic; unclear as to drop since Nov. Elevated ferritin as well.  No B12 def, normal folate, normal iron studies.  Normal TSH    The differential diagnosis for a normocytic anemia is broad, and includes blood loss, anemia of chronic disease, chronic renal insufficiency, iron deficiency, hypothyroidism, hemolysis, and bone marrow suppression.  MDS, B12 deficiency, folate deficiency, and etoh abuse can also lead to anemia, though it is generally macrocytic.      Bone Marrow bx 10/02/16: normal. Referred to GI.  Will recheck CBC in 1 month and see her back in 3 months.     Last EGD and colonoscopy in 2013.      Thank you for this consult.  All of the patient's questions were answered today.        Follow-up Disposition:  Return for 3 month fu, Ziyan Schoon.    Sonda Rumble, MD

## 2016-10-12 NOTE — Telephone Encounter (Signed)
Pt is scheduled with Dr. Alita Chyle on 10/24/16 845am but to arrive at 815am at [facility] [phone] and to call [phone] to pre register before the appointment.     Called pt and left a voicemail on her home phone and encouraged pt to call the office with any questions.

## 2016-11-07 ENCOUNTER — Inpatient Hospital Stay: Admit: 2017-02-12 | Payer: MEDICARE | Primary: Internal Medicine

## 2016-11-07 DIAGNOSIS — D649 Anemia, unspecified: Secondary | ICD-10-CM

## 2016-11-08 LAB — CBC W/O DIFF
HCT: 31.6 % — ABNORMAL LOW (ref 34.0–46.6)
HGB: 9.8 g/dL — ABNORMAL LOW (ref 11.1–15.9)
MCH: 27.4 pg (ref 26.6–33.0)
MCHC: 31 g/dL — ABNORMAL LOW (ref 31.5–35.7)
MCV: 88 fL (ref 79–97)
PLATELET: 259 10*3/uL (ref 150–379)
RBC: 3.58 x10E6/uL — ABNORMAL LOW (ref 3.77–5.28)
RDW: 16.6 % — ABNORMAL HIGH (ref 12.3–15.4)
WBC: 5 10*3/uL (ref 3.4–10.8)

## 2016-11-12 ENCOUNTER — Inpatient Hospital Stay: Payer: MEDICARE

## 2016-11-12 LAB — POC H. PYLORI, TISSUE
H. pylori from tissue: NEGATIVE
Lot no.: 284047
Negative control: NEGATIVE
Positive control: POSITIVE

## 2016-11-12 MED ORDER — SIMETHICONE 40 MG/0.6 ML ORAL DROPS, SUSP
40 mg/0.6 mL | ORAL | Status: DC | PRN
Start: 2016-11-12 — End: 2016-11-12

## 2016-11-12 MED ORDER — NALOXONE 0.4 MG/ML INJECTION
0.4 mg/mL | INTRAMUSCULAR | Status: DC | PRN
Start: 2016-11-12 — End: 2016-11-12

## 2016-11-12 MED ORDER — SODIUM CHLORIDE 0.9 % IV
INTRAVENOUS | Status: DC
Start: 2016-11-12 — End: 2016-11-12

## 2016-11-12 MED ORDER — MIDAZOLAM 1 MG/ML IJ SOLN
1 mg/mL | INTRAMUSCULAR | Status: DC | PRN
Start: 2016-11-12 — End: 2016-11-12

## 2016-11-12 MED ORDER — FLUMAZENIL 0.1 MG/ML IV SOLN
0.1 mg/mL | INTRAVENOUS | Status: DC | PRN
Start: 2016-11-12 — End: 2016-11-12

## 2016-11-12 MED ORDER — EPINEPHRINE 0.1 MG/ML SYRINGE
0.1 mg/mL | Freq: Once | INTRAMUSCULAR | Status: DC | PRN
Start: 2016-11-12 — End: 2016-11-12

## 2016-11-12 MED ORDER — SODIUM CHLORIDE 0.9 % IV
INTRAVENOUS | Status: DC | PRN
Start: 2016-11-12 — End: 2016-11-12
  Administered 2016-11-12: 17:00:00 via INTRAVENOUS

## 2016-11-12 MED ORDER — PROPOFOL 10 MG/ML IV EMUL
10 mg/mL | INTRAVENOUS | Status: DC | PRN
Start: 2016-11-12 — End: 2016-11-12
  Administered 2016-11-12: 17:00:00 via INTRAVENOUS

## 2016-11-12 MED ORDER — ATROPINE 0.1 MG/ML SYRINGE
0.1 mg/mL | Freq: Once | INTRAMUSCULAR | Status: DC | PRN
Start: 2016-11-12 — End: 2016-11-12

## 2016-11-12 MED FILL — SODIUM CHLORIDE 0.9 % IV: INTRAVENOUS | Qty: 1000

## 2016-11-12 MED FILL — PROPOFOL 10 MG/ML IV EMUL: 10 mg/mL | INTRAVENOUS | Qty: 100

## 2016-11-12 MED FILL — PROPOFOL 10 MG/ML IV EMUL: 10 mg/mL | INTRAVENOUS | Qty: 157.75

## 2016-11-12 NOTE — Procedures (Signed)
Procedures  by Burnett Harry, MD at 11/12/16 1349                Author: Burnett Harry, MD  Service: Gastroenterology  Author Type: Physician       Filed: 11/12/16 1353  Date of Service: 11/12/16 1349  Status: Signed          Editor: Burnett Harry, MD (Physician)            Pre-procedure Diagnoses        1. Iron deficiency anemia, unspecified iron deficiency anemia type [D50.9]                           Post-procedure Diagnoses        1. Hiatal hernia [K44.9]        2. 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 UPPER GI ENDOSCOPY,BIOPSY [[id number]]                              [facility]   [address] [place] [address]        [phone]                           11/12/2016                  EGD Operative Report   RIELYN KRUPINSKI   DOB:  1941/08/01   [facility] Medical Record Number:  [id number]         Indication:  Iron deficiency anemia       Operator: Burnett Harry, MD      Referring Provider:  [name], MD         Anesthesia/Sedation:  MAC anesthesia Propofol      Airway assessment: No airway problems anticipated      Pre-Procedural Exam:        Airway: clear, no airway problems anticipated   Heart: RRR, without gallops or rubs   Lungs: clear bilaterally without wheezes, crackles, or rhonchi   Abdomen: soft, nontender, nondistended, bowel sounds present   Mental Status: awake, alert and oriented to person, place and time         Procedure Details       After infomed consent was obtained for the procedure, with all risks and benefits of procedure explained the patient was taken to the endoscopy suite and placed in the left lateral decubitus position.  Following sequential administration of sedation as  per above, the endoscope was inserted into the mouth and advanced under direct vision to second portion of the duodenum.  A careful inspection was made as the gastroscope was withdrawn, including a  retroflexed view of the proximal stomach; findings and  interventions are described below.        Findings:    Esophagus:normal mucosa   Stomach: normal mucosa in antrum and body.CLO test performed.    Small sliding hiatal hernia noted   Retroflexion of the stomach shows patchy atrophic type mucosa in the fundus. Bx taken for histology.   Duodenum/jejunum: normal mucosa.  Bx taken for histology      Therapies:  biopsy of duodenal random                        Biopsy of stomach      Specimens: as above             Complications:   None; patient tolerated the procedure well.      EBL:  None.             Impression:       Atrophic gastritis   Hiatal hernia        Recommendations:      -Await pathology.,    -Await CLO test result and treat for Helicobacter pylori if positive.,   -GERD diet: avoid fried and fatty foods. peppermint, chocolate, alcohol, coffee, citrus fruits and juices, tomoato products; avoid lying down for 2 to 3 hours after eating.   -No NSAIDS      Burnett Harry, MD

## 2016-11-12 NOTE — Procedures (Signed)
Procedures  by Burnett Harry, MD at 11/12/16 1353                Author: Burnett Harry, MD  Service: Gastroenterology  Author Type: Physician       Filed: 11/12/16 1355  Date of Service: 11/12/16 1353  Status: Signed          Editor: Burnett Harry, MD (Physician)            Pre-procedure Diagnoses        1. Iron deficiency anemia, unspecified iron deficiency anemia type [D50.9]                           Post-procedure Diagnoses        1. Iron deficiency anemia, unspecified iron deficiency anemia type [D50.9]                           Procedures        1. [facility]   [address] [place] [address]   [phone]                         Colonoscopy Operative Report         Indications:    Iron deficiency anemia       Operator:  Burnett Harry, MD      Referring Provider: [name], MD      Sedation:  MAC anesthesia Propofol      Procedure Details:  After informed consent was obtained with all risks and benefits of procedure explained and preoperative exam completed,  the patient was taken to the endoscopy suite and placed in the left lateral decubitus position.  Upon sequential sedation as per above, a digital rectal exam was performed  And was normal.  The Olympus videocolonoscope  was inserted in the rectum and  carefully advanced to the cecum, which was identified by the ileocecal valve and appendiceal orifice, terminal ileum.  The quality of preparation  was excellent.  The colonoscope was slowly withdrawn with careful evaluation between folds. Retroflexion in the rectum was performed and was normal..       Findings:    Rectum: Grade 1 internal hemorrhoid(s);   Sigmoid:     - Diverticulosis   Descending Colon: normal   Transverse Colon: normal   Ascending Colon: normal   Cecum: no mucosal lesion appreciated   Terminal Ileum: normal      Interventions:  none      Specimen  Removed:  none      Complications: None.       EBL:  None.      Recommendations:    -Await pathology.   -For colon cancer screening in this average-risk patient, colonoscopy is not longer recommended given patients age   -High fiber diet.      -Resume normal medication(s)          Discharge Disposition:  Home in the company of a driver when able to ambulate.      Burnett Harry, MD   11/12/2016  1:54 PM

## 2016-11-12 NOTE — Progress Notes (Signed)
Anesthesia staff at patient's bedside administering anesthesia and monitoring patients vital signs throughout procedure. See anesthesia note.

## 2016-11-12 NOTE — Progress Notes (Signed)
Discharge instructions given at bedside with the patient and her spouse.

## 2016-11-12 NOTE — Anesthesia Pre-Procedure Evaluation (Signed)
Anesthetic History     PONV          Review of Systems / Medical History  Patient summary reviewed, nursing notes reviewed and pertinent labs reviewed    Pulmonary  Within defined limits                 Neuro/Psych             Comments: LE pain R>L Cardiovascular              Hyperlipidemia    Exercise tolerance: >4 METS     GI/Hepatic/Renal     GERD: well controlled      Hiatal hernia     Endo/Other      Hypothyroidism: well controlled  Arthritis, cancer (right breast cancer) and anemia     Other Findings   Comments: Chronic low back pain           Physical Exam    Airway  Mallampati: II  TM Distance: > 6 cm  Neck ROM: normal range of motion   Mouth opening: Normal     Cardiovascular  Regular rate and rhythm,  S1 and S2 normal,  no murmur, click, rub, or gallop             Dental    Dentition: Caps/crowns and Lower partial plate     Pulmonary  Breath sounds clear to auscultation               Abdominal  GI exam deferred       Other Findings            Anesthetic Plan    ASA: 2  Anesthesia type: MAC          Induction: Intravenous  Anesthetic plan and risks discussed with: Patient

## 2016-11-12 NOTE — Progress Notes (Signed)
See anesthesia flow-sheet for procedural sedation and vital signs. No respiratory distress. Abdomen without distention. Stable for transfer to recovery per anesthesia. Rapid H Pylori obtained.  Assumed care of patient from anesthesia. Endoscope was pre-cleaned at bedside immediately following procedure by Velna Hatchet

## 2016-11-12 NOTE — Progress Notes (Signed)
DAVANEE KLINKNER  12-Dec-1940  [id number]    Situation:  Verbal report received from:   Langley Gauss, RN  Procedure: Procedure(s):  COLONOSCOPY  ESOPHAGOGASTRODUODENOSCOPY (EGD)  ESOPHAGOGASTRODUODENAL (EGD) BIOPSY    Background:    Preoperative diagnosis: ANEMIA   Postoperative diagnosis: Atropic gastritis  Hiatal hernia    Operator:  Dr. Alita Chyle  Assistant(s): Endoscopy Technician-1: Mickeal Needy  Endoscopy RN-1: Blima Dessert, RN    Specimens:   ID Type Source Tests Collected by Time Destination   1 : Duodenal bx Preservative Duodenum  Burnett Harry, MD 11/12/2016 1328 Pathology   2 : Fundus bx Preservative   Burnett Harry, MD 11/12/2016 1328 Pathology     H. Pylori  no    Assessment:  Intra-procedure medications      Anesthesia gave intra-procedure sedation and medications, see anesthesia flow sheet yes    Intravenous fluids: NS@ KVO     Vital signs stable   yes    Abdominal assessment: round and soft   yes    Recommendation:  Discharge patient per MD order  yes.  Return to floor  outpatient  Family or Friend   spouse  Permission to share finding with family or friend yes

## 2016-11-12 NOTE — Procedures (Signed)
Jillian Bean  [address] [place] [address]       [phone]                   11/12/2016                EGD Operative Report  MANROOP JAKUBOWICZ  DOB:  1941-07-19  [facility] Medical Record Number:  [id number]      Indication:  Iron deficiency anemia     Operator: Burnett Harry, MD    Referring Provider:  [name], MD      Anesthesia/Sedation:  MAC anesthesia Propofol    Airway assessment: No airway problems anticipated    Pre-Procedural Exam:      Airway: clear, no airway problems anticipated  Heart: RRR, without gallops or rubs  Lungs: clear bilaterally without wheezes, crackles, or rhonchi  Abdomen: soft, nontender, nondistended, bowel sounds present  Mental Status: awake, alert and oriented to person, place and time       Procedure Details     After infomed consent was obtained for the procedure, with all risks and benefits of procedure explained the patient was taken to the endoscopy suite and placed in the left lateral decubitus position.  Following sequential administration of sedation as per above, the endoscope was inserted into the mouth and advanced under direct vision to second portion of the duodenum.  A careful inspection was made as the gastroscope was withdrawn, including a retroflexed view of the proximal stomach; findings and interventions are described below.      Findings:   Esophagus:normal mucosa  Stomach: normal mucosa in antrum and body.CLO test performed.   Small sliding hiatal hernia noted  Retroflexion of the stomach shows patchy atrophic type mucosa in the fundus. Bx taken for histology.  Duodenum/jejunum: normal mucosa. Bx taken for histology    Therapies:  biopsy of duodenal random                       Biopsy of stomach    Specimens: as above           Complications:   None; patient tolerated the procedure well.    EBL:  None.           Impression:      Atrophic gastritis  Hiatal hernia      Recommendations:    -Await pathology.,    -Await CLO test result and treat for Helicobacter pylori if positive.,  -GERD diet: avoid fried and fatty foods. peppermint, chocolate, alcohol, coffee, citrus fruits and juices, tomoato products; avoid lying down for 2 to 3 hours after eating.  -No NSAIDS    Burnett Harry, MD

## 2016-11-12 NOTE — Procedures (Signed)
[facility]  [address] [place] [address]  [phone]                   Colonoscopy Operative Report      Indications:    Iron deficiency anemia     Operator:  Burnett Harry, MD    Referring Provider: [name], MD    Sedation:  MAC anesthesia Propofol    Procedure Details:  After informed consent was obtained with all risks and benefits of procedure explained and preoperative exam completed, the patient was taken to the endoscopy suite and placed in the left lateral decubitus position.  Upon sequential sedation as per above, a digital rectal exam was performed  And was normal.  The Olympus videocolonoscope  was inserted in the rectum and carefully advanced to the cecum, which was identified by the ileocecal valve and appendiceal orifice, terminal ileum.  The quality of preparation was excellent.  The colonoscope was slowly withdrawn with careful evaluation between folds. Retroflexion in the rectum was performed and was normal..     Findings:   Rectum: Grade 1 internal hemorrhoid(s);  Sigmoid:     - Diverticulosis  Descending Colon: normal  Transverse Colon: normal  Ascending Colon: normal  Cecum: no mucosal lesion appreciated  Terminal Ileum: normal    Interventions:  none    Specimen Removed:  none    Complications: None.     EBL:  None.    Recommendations:   -Await pathology.  -For colon cancer screening in this average-risk patient, colonoscopy is not longer recommended given patients age  -High fiber diet.     -Resume normal medication(s)       Discharge Disposition:  Home in the company of a driver when able to ambulate.    Burnett Harry, MD  11/12/2016  1:54 PM

## 2016-11-12 NOTE — Anesthesia Post-Procedure Evaluation (Signed)
Post-Anesthesia Evaluation and Assessment    Patient: Jillian Bean MRN: [id number]  SSN: [id number]    Date of Birth: Apr 07, 1941  Age: 76 y.o.  Sex: female       Cardiovascular Function/Vital Signs  Visit Vitals   ??? BP 130/67   ??? Pulse 77   ??? Temp 36.9 ??C (98.4 ??F)   ??? Resp 15   ??? Ht 5' 5"  (1.651 m)   ??? Wt 62.6 kg (138 lb)   ??? SpO2 99%   ??? Breastfeeding No   ??? BMI 22.96 kg/m2       Patient is status post MAC anesthesia for Procedure(s):  COLONOSCOPY  ESOPHAGOGASTRODUODENOSCOPY (EGD)  ESOPHAGOGASTRODUODENAL (EGD) BIOPSY.    Nausea/Vomiting: None    Postoperative hydration reviewed and adequate.    Pain:  Pain Scale 1: Numeric (0 - 10) (11/12/16 1410)  Pain Intensity 1: 0 (11/12/16 1410)   Managed    Neurological Status:       At baseline    Mental Status and Level of Consciousness: Arousable    Pulmonary Status:   O2 Device: Room air (11/12/16 1410)   Adequate oxygenation and airway patent    Complications related to anesthesia: None    Post-anesthesia assessment completed. No concerns    Signed By: Danella Sensing, MD     November 12, 2016

## 2016-11-12 NOTE — H&P (Signed)
[facility] [place] [address]  [phone]                     History and Physical     NAME: Jillian Bean   DOB:  August 03, 1941   MRN:  [id number]     HPI:  76 year old female who presents with a complaint of Anemia. The patient presents consultation at the request of Dr. Laurena Bering). The onset of the anemia has been chronic and has been occurring for 1 year. The course has been constant. The anemia is described as a normochromic/normocytic. The symptoms have been associated with use of NSAIDs, ??while the symptoms have not been associated with change in bowel habits, hematemesis, hematochezia or melena. Lab results: Hemoglobin/hematocrit/WBC/HCV/platelet count. The anemia are characterized as not well controlled. Note for "Anemia": .    Patient is a 76 yo F, presenting to the clinic due to the request of Dr. Laurena Bering, due to anemia. Most recent Hgb evaluation showed Hgb of 8.7 (previously 11.0) and elevated ferritin. Per patient report, the anemia started after chemo treatment for her cancer. Denies seeing sources of bleeding, hematemesis, and rectal bleeding. She is currently on Diclofenac, due to joint pains, as well as Ranitidine. SHe recently ahd a BM bx and was unremarkable. She has had colonoscopy in 2013 ( per pt was unremarkable)     Past Surgical History:   Procedure Laterality Date   ??? BREAST SURGERY PROCEDURE UNLISTED  1993    Breast Reduction   ??? ENDOSCOPY, COLON, DIAGNOSTIC  7/12/18/08    7/05 Dr Maudry Diego   ??? HX COLONOSCOPY  2014    Dr Illene Silver   ??? HX GI      COLONOSCOPY   ??? HX GYN      Hysterectomy in her 41's   ??? HX HEENT  04/07/10    thyroid biopsy neg dr Leward Quan   ??? HX HEENT      wisdom teeth extraction   ??? HX ORTHOPAEDIC  08/22/09    left TKR   ??? HX ORTHOPAEDIC  08/13/12    right tkr   ??? HX OTHER SURGICAL      MINIMALLY INVASIVE LUMBAR DECOMPRESSION     Past Medical History:   Diagnosis Date   ??? Back pain 09/01/2007   ??? Cancer (HCC)     R breast    ??? Coagulation disorder ([place])     IRON DEFFICIENCY ANEMIA   ??? Colonic polyps 08/31/1998   ??? 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 Hiatal hernia 07/11/2011    Dr.sobieski   ??? Hypercholesteremia 09/01/2007   ??? Hypothyroidism 09/01/2003   ??? 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 Nausea & vomiting    ??? OA (osteoarthritis) 09/01/2007   ??? Other ill-defined conditions(799.89)     VERTIGO   ??? Plantar fasciitis 09/01/2007   ??? S/P colonoscopy 10/10/2007   ??? Thyroid disease      Social History   Substance Use Topics   ??? Smoking status: Never Smoker   ??? Smokeless tobacco: Never Used   ??? Alcohol use No     Allergies   Allergen Reactions   ??? Atorvastatin Myalgia   ??? Zocor [Simvastatin] Other (comments) and Diarrhea     Other reaction(s): Adverse reaction to substance   Myalgias     ???  Celebrex [Celecoxib] Other (comments)     Aggitation   ??? Gabapentin Other (comments)     FELT UNSTEADY ON MY FEET   ??? Levaquin [Levofloxacin] Palpitations   ??? Nexium [Esomeprazole Magnesium] Diarrhea   ??? Oxycontin [Oxycodone] Itching   ??? Pravastatin Nausea and Vomiting   ??? Yellow Dye Hives     Family History   Problem Relation Age of Onset   ??? Cancer Mother      Breast   ??? Heart Failure Father      MI   ??? Seizures Brother      ms     No current facility-administered medications for this encounter.          PHYSICAL EXAM:  General: WD, WN. Alert, cooperative, no acute distress????  HEENT: NC, Atraumatic.  PERRLA, EOMI. Anicteric sclerae.  Lungs:  CTA Bilaterally. No Wheezing/Rhonchi/Rales.  Heart:  Regular  rhythm,?? No murmur, No Rubs, No Gallops  Abdomen: Soft, Non distended, Non tender. ??+Bowel sounds, no HSM  Extremities: No c/c/e  Neurologic:?? CN 2-12 gi, Alert and oriented X 3.  No acute neurological distress   Psych:???? Good insight.??Not anxious nor agitated.           Assessment:   I have reviewed with the patient +/- family alternatives,benefits and  risks for the procedure, as well as potential complications(with emphasis on, but not limited to, bleeding, perforation, cardiovascular/cerebrovascular/pulmonary events, reactions to the medications, infection, risk of missing a lesions/a cancer, and the imponderables including death), alternative options, and patient/family voices understanding.      Plan:   ?? Endoscopic procedure  ?? Conscious sedation or MAC

## 2016-11-15 NOTE — Telephone Encounter (Signed)
Received a call from the patient and verified ID x 2.  The patient stated that she had a knee replacement and Dr. Zollie Beckers with [facility] is concerned that her knee is infected and will require another knee replacement because he is worried that the infection will go to her blood stream and they would like Dr. Valinda Hoar opinion since the patient is anemic.  The call was transferred to Dr. Robina Ade nurse, Ivin Booty, and informed her that Dr. Laurena Bering is out of the office and will be made aware of the the patient's and Dr. Robina Ade questions and concerns and this office will reach out to [facility] on Monday 11/19/16.  Ivin Booty verbalized understanding and stated that they will reschedule the patient a week out to 11/26/16 and to call [phone] extension [phone] and denied any further questions or concerns.

## 2016-11-19 NOTE — Telephone Encounter (Signed)
Called the patient and verified ID x 2.  Informed the patient that Dr. Laurena Bering recommends that she see him prior to knee surgery and that he has an available appointment this Wednesday 11/21/16 at 10:30 am.  The patient verbalized understanding and stated that she is able to make that date and time and denied any further questions or concerns.

## 2016-11-20 ENCOUNTER — Ambulatory Visit: Admit: 2016-11-20 | Discharge: 2016-11-20 | Payer: MEDICARE | Attending: Nurse Practitioner | Primary: Internal Medicine

## 2016-11-20 DIAGNOSIS — M1711 Unilateral primary osteoarthritis, right knee: Secondary | ICD-10-CM

## 2016-11-20 NOTE — Progress Notes (Signed)
HISTORY OF PRESENT ILLNESS  Jillian Bean is a 76 y.o. female.  HPI  Jillian Bean was referred for evaluation by:Dr. Zollie Beckers for Pre- Op Evaluation.  Please see encounter details and orders for consultative summary.    Type of surgery : Right knee revision  Surgery site : Right knee  Anesthesia type: To be determined  Date of procedure:  To be scheduled pending clearance from Hematology    Allergies:   Allergies   Allergen Reactions   ??? Atorvastatin Myalgia   ??? Zocor [Simvastatin] Other (comments) and Diarrhea     Other reaction(s): Adverse reaction to substance   Myalgias     ??? Celebrex [Celecoxib] Other (comments)     Aggitation   ??? Gabapentin Other (comments)     FELT UNSTEADY ON MY FEET   ??? Levaquin [Levofloxacin] Palpitations   ??? Nexium [Esomeprazole Magnesium] Diarrhea   ??? Other Medication Swelling     Latanoprost drops   ??? Oxycontin [Oxycodone] Itching   ??? Pravastatin Nausea and Vomiting   ??? Yellow Dye Hives     Latex allergy: no  Prior reactions to anesthesia:  None    Functional status:  she is able to ambulate up 1 flights of step without shortness of breath, chest pain  Prior cardiac evaluation:   None  Denies sleep apnea; low risk.  No previous DVT or PE  Flu vaccine 06/01/2016  Prevnar 13 vaccine 07/15/2015    Has been seeing Hematology for work up of anemia; reports she had bone marrow biopsy which was normal. Scheduling for surgery is pending approval by Hematology to proceed with surgery.    Current Outpatient Prescriptions   Medication Sig   ??? brimonidine-timolol (COMBIGAN) 0.2-0.5 % drop ophthalmic solution Administer 1 Drop to both eyes every twelve (12) hours.   ??? diclofenac EC (VOLTAREN) 50 mg EC tablet Take 1 Tab by mouth two (2) times daily as needed.   ??? raNITIdine (ZANTAC) 150 mg tablet Take 1 Tab by mouth two (2) times a day.   ??? alendronate (FOSAMAX) 70 mg tablet Take 1 Tab by mouth every seven (7) days.   ??? colesevelam (WELCHOL) 625 mg tablet 1 po bid    ??? levothyroxine (SYNTHROID) 112 mcg tablet 8 per week   ??? acetaminophen (TYLENOL) 500 mg tablet Take  by mouth every six (6) hours as needed for Pain.   ??? aspirin 81 mg chewable tablet Take 81 mg by mouth daily.   ??? multivitamin (ONE A DAY) tablet Take 1 Tab by mouth daily.   ??? cholecalciferol, vitamin d3, (VITAMIN D) 1,000 unit tablet Take  by mouth daily.   ??? docusate sodium (COLACE) 100 mg capsule Take 100 mg by mouth daily.     No current facility-administered medications for this visit.      Past Medical History:   Diagnosis Date   ??? Anemia    ??? Back pain 09/01/2007   ??? Cancer (HCC)     R breast   ??? Coagulation disorder ([place])     IRON DEFFICIENCY ANEMIA   ??? Colonic polyps 08/31/1998   ??? 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 Hiatal hernia 07/11/2011    Dr.sobieski   ??? Hypercholesteremia 09/01/2007   ??? Hypothyroidism 09/01/2003   ??? 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 Nausea & vomiting    ??? OA (osteoarthritis) 09/01/2007   ???  Other ill-defined conditions(799.89)     VERTIGO   ??? Plantar fasciitis 09/01/2007   ??? S/P colonoscopy 10/10/2007   ??? Thyroid disease      Past Surgical History:   Procedure Laterality Date   ??? BREAST SURGERY PROCEDURE UNLISTED  1993    Breast Reduction   ??? COLONOSCOPY N/A 11/12/2016    COLONOSCOPY performed by Burnett Harry, MD at [facility]   ??? COLONOSCOPY,DIAGNOSTIC  11/12/2016        ??? ENDOSCOPY, COLON, DIAGNOSTIC  7/12/18/08    7/05 Dr Maudry Diego   ??? HX BREAST LUMPECTOMY Right 2016   ??? HX COLONOSCOPY  2014    Dr Illene Silver   ??? HX GI      COLONOSCOPY   ??? HX GYN      Hysterectomy in her 25's   ??? HX HEENT  04/07/10    thyroid biopsy neg dr Leward Quan   ??? HX HEENT      wisdom teeth extraction   ??? HX ORTHOPAEDIC  08/22/09    left TKR   ??? HX ORTHOPAEDIC  08/13/12    right tkr   ??? HX OTHER SURGICAL      MINIMALLY INVASIVE LUMBAR DECOMPRESSION   ??? UPPER GI ENDOSCOPY,BIOPSY  11/12/2016          Social History    Substance Use Topics   ??? Smoking status: Never Smoker   ??? Smokeless tobacco: Never Used   ??? Alcohol use No       Blood pressure 125/43, pulse 77, temperature 98.3 ??F (36.8 ??C), temperature source Oral, resp. rate 12, height '5\' 5"'  (1.651 m), weight 144 lb (65.3 kg), last menstrual period 04/13/2010, SpO2 98 %.    Review of Systems   Constitutional: Negative for chills, fever and malaise/fatigue.   HENT: Negative for congestion, ear pain, sinus pain and sore throat.    Respiratory: Negative for cough, sputum production and shortness of breath.    Cardiovascular: Negative for chest pain, palpitations and leg swelling.   Gastrointestinal: Negative for abdominal pain, blood in stool, constipation, diarrhea, nausea and vomiting.   Genitourinary: Negative for dysuria, frequency, hematuria and urgency.   Musculoskeletal: Positive for joint pain (right knee pain and edema).   Skin: Negative for rash.   Neurological: Negative for dizziness, tingling and headaches.       Physical Exam   Constitutional: She is oriented to person, place, and time. She appears well-developed and well-nourished.   HENT:   Head: Normocephalic and atraumatic.   Right Ear: External ear normal.   Left Ear: External ear normal.   Nose: Nose normal.   Mouth/Throat: Oropharynx is clear and moist.   Eyes: Conjunctivae are normal.   Neck: Normal range of motion. Neck supple. No thyromegaly present.   Cardiovascular: Normal rate, regular rhythm and normal heart sounds.    Pulmonary/Chest: Effort normal and breath sounds normal. She has no wheezes. She has no rales.   Abdominal: Soft. Bowel sounds are normal. There is no tenderness. There is no rebound.   Musculoskeletal:        Right knee: She exhibits decreased range of motion, swelling and bony tenderness.        Legs:  Lymphadenopathy:     She has no cervical adenopathy.   Neurological: She is alert and oriented to person, place, and time. No cranial nerve deficit.    Skin: Skin is warm and dry. No rash noted.   Psychiatric: She has a normal mood and affect.  Her behavior is normal.   Nursing note and vitals reviewed.      ASSESSMENT and PLAN  Diagnoses and all orders for this visit:    1. Primary osteoarthritis of right knee    2. Preop general physical exam -- considered medically stable for upcoming surgery pending approval from Hematology in regards to her anemia.  Will have preoperative labs and EKG done at [facility] once surgery is scheduled.    3. Other iron deficiency anemia    4. Hypercholesteremia    5. Acquired hypothyroidism -- stable      lab results and schedule of future lab studies reviewed with patient  reviewed diet, exercise and weight control  reviewed medications and side effects in detail

## 2016-11-20 NOTE — Telephone Encounter (Signed)
I called [facility] per NP to see if and when pt is having her pre op testing done at the hospital or do they want [name] to do some of the labs and ekg. Per Caryl Pina one of the nurses, they will call patient end of this week to schedule pre admission testing and will be scheduled for surgery next Monday or Tuesday. Their office will be in contact with patient.

## 2016-11-20 NOTE — Patient Instructions (Addendum)
Partial Knee Replacement: Before Your Surgery  What is partial knee replacement?    Partial knee replacement is used when one side of the knee is damaged. It replaces only the damaged part of the knee.  Your doctor will make a cut in your knee. This cut is called an incision. It will leave a scar that usually fades with time.  You may be able to go home the same day. If you have partials on both knees at once, you may need to stay in the hospital for a day or more.  Most people go back to normal activities or work in 6 to 12 weeks. This depends on your health. It also depends on how well your knee does in your rehab program. This may take longer if you have both knees done at the same time.  Follow-up care is a key part of your treatment and safety. Be sure to make and go to all appointments, and call your doctor if you are having problems. It's also a good idea to know your test results and keep a list of the medicines you take.  What happens before surgery?  ?Surgery can be stressful. This information will help you understand what you can expect. And it will help you safely prepare for surgery.  ?Preparing for surgery  ? ?? Understand exactly what surgery is planned, along with the risks, benefits, and other options.    ?? Tell your doctors ALL the medicines, vitamins, supplements, and herbal remedies you take. Some of these can increase the risk of bleeding or interact with anesthesia.   ? ?? If you take blood thinners, such as warfarin (Coumadin), clopidogrel (Plavix), or aspirin, be sure to talk to your doctor. He or she will tell you if you should stop taking these medicines before your surgery. Make sure that you understand exactly what your doctor wants you to do.   ? ?? Your doctor will tell you which medicines to take or stop before your surgery. You may need to stop taking certain medicines a week or more before surgery. So talk to your doctor as soon as you can.    ? ?? If you have an advance directive, let your doctor know. It may include a living will and a durable power of attorney for health care. Bring a copy to the hospital. If you don't have one, you may want to prepare one. It lets your doctor and loved ones know your health care wishes. Doctors advise that everyone prepare these papers before any type of surgery or procedure.   ? ?? You may need to shower or bathe with a special soap the night before and the morning of your surgery. The soap contains chlorhexidine. It reduces the amount of bacteria on your skin that could cause an infection after surgery.   What happens on the day of surgery?   ?? Follow the instructions exactly about when to stop eating and drinking. If you don't, your surgery may be canceled. If your doctor told you to take your medicines on the day of surgery, take them with only a sip of water.   ? ?? Take a bath or shower before you come in for your surgery. Do not apply lotions, perfumes, deodorants, or nail polish.   ? ?? Do not shave the surgical site yourself.   ? ?? Take off all jewelry and piercings. And take out contact lenses, if you wear them.   ?At the hospital or surgery center   ??  Bring a picture ID.   ? ?? The area for surgery is often marked to make sure there are no errors.   ? ?? You will be kept comfortable and safe by your anesthesia provider. The anesthesia may make you sleep. Or it may just numb the area being worked on.   ? ?? You may also get a shot of medicine into your spine. This will make your legs numb. You will not feel pain during the surgery.   ? ?? You also will get antibiotics through an IV tube before surgery. This lowers the risk of an infection of the incision.   ? ?? The surgery will take about 2 to 3 hours.   Going home   ?? Be sure you have someone to drive you home. Anesthesia and pain medicine make it unsafe for you to drive.   ? ?? You will be given more specific instructions about recovering from  your surgery. They will cover things like diet, wound care, follow-up care, driving, and getting back to your normal routine.   When should you call your doctor?   ?? You have questions or concerns.   ? ?? You don't understand how to prepare for your surgery.   ? ?? You become ill before the surgery (such as fever, flu, or a cold).   ? ?? You need to reschedule or have changed your mind about having the surgery.   Where can you learn more?  Go to [email or web address].  Enter G457 in the search box to learn more about "Partial Knee Replacement: Before Your Surgery."  Current as of: November 01, 2015  Content Version: 11.4  ?? 2006-2017 [employer]. Care instructions adapted under license by [employer] (which disclaims liability or warranty for this information). If you have questions about a medical condition or this instruction, always ask your healthcare professional. [place] any warranty or liability for your use of this information.

## 2016-11-21 ENCOUNTER — Ambulatory Visit: Admit: 2016-11-21 | Discharge: 2016-11-21 | Payer: MEDICARE | Attending: Nurse Practitioner | Primary: Internal Medicine

## 2016-11-21 ENCOUNTER — Inpatient Hospital Stay: Admit: 2016-11-21 | Payer: MEDICARE | Primary: Internal Medicine

## 2016-11-21 DIAGNOSIS — Z01810 Encounter for preprocedural cardiovascular examination: Secondary | ICD-10-CM

## 2016-11-21 DIAGNOSIS — Z853 Personal history of malignant neoplasm of breast: Secondary | ICD-10-CM

## 2016-11-21 LAB — URINALYSIS W/ REFLEX CULTURE
Bilirubin: NEGATIVE
Glucose: NEGATIVE mg/dL
Ketone: NEGATIVE mg/dL
Nitrites: NEGATIVE
Protein: NEGATIVE mg/dL
Specific gravity: 1.019 (ref 1.003–1.030)
Urobilinogen: 0.2 EU/dL (ref 0.2–1.0)
pH (UA): 6 (ref 5.0–8.0)

## 2016-11-21 LAB — METABOLIC PANEL, BASIC
Anion gap: 8 mmol/L (ref 5–15)
BUN/Creatinine ratio: 18 (ref 12–20)
BUN: 12 MG/DL (ref 6–20)
CO2: 28 mmol/L (ref 21–32)
Calcium: 8.4 MG/DL — ABNORMAL LOW (ref 8.5–10.1)
Chloride: 107 mmol/L (ref 97–108)
Creatinine: 0.65 MG/DL (ref 0.55–1.02)
GFR est AA: >60 mL/min/{1.73_m2} (ref >60)
GFR est non-AA: >60 mL/min/{1.73_m2} (ref >60)
Glucose: 95 mg/dL (ref 65–100)
Potassium: 3.7 mmol/L (ref 3.5–5.1)
Sodium: 143 mmol/L (ref 136–145)

## 2016-11-21 LAB — CBC W/O DIFF
ABSOLUTE NRBC: 0 10*3/uL (ref 0.00–0.01)
HCT: 28 % — ABNORMAL LOW (ref 35.0–47.0)
HGB: 8.4 g/dL — ABNORMAL LOW (ref 11.5–16.0)
MCH: 28.3 PG (ref 26.0–34.0)
MCHC: 30 g/dL (ref 30.0–36.5)
MCV: 94.3 FL (ref 80.0–99.0)
MPV: 9.6 FL (ref 8.9–12.9)
NRBC: 0 PER 100 WBC
PLATELET: 243 10*3/uL (ref 150–400)
RBC: 2.97 M/uL — ABNORMAL LOW (ref 3.80–5.20)
RDW: 14.7 % — ABNORMAL HIGH (ref 11.5–14.5)
WBC: 5.3 10*3/uL (ref 3.6–11.0)

## 2016-11-21 LAB — EKG, 12 LEAD, INITIAL
Atrial Rate: 68 {beats}/min
Calculated P Axis: 22 degrees
Calculated R Axis: -13 degrees
Calculated T Axis: 23 degrees
P-R Interval: 158 ms
Q-T Interval: 400 ms
QRS Duration: 92 ms
QTC Calculation (Bezet): 425 ms
Ventricular Rate: 68 {beats}/min

## 2016-11-21 LAB — PROTHROMBIN TIME + INR
INR: 1 (ref 0.9–1.1)
Prothrombin time: 10.4 s (ref 9.0–11.1)

## 2016-11-21 LAB — HEMOGLOBIN A1C WITH EAG: Hemoglobin A1c: 4.7 % (ref 4.2–6.3)

## 2016-11-21 LAB — EKG 12-LEAD
Atrial Rate: 68 {beats}/min
P Axis: 22 degrees
P-R Interval: 158 ms
Q-T Interval: 400 ms
QRS Duration: 92 ms
QTc Calculation (Bazett): 425 ms
R Axis: -13 degrees
T Axis: 23 degrees
Ventricular Rate: 68 {beats}/min

## 2016-11-21 NOTE — Other (Signed)
PREOPERATIVE INSTRUCTIONS REVIEWED WITH PATIENT.  PATIENT GIVEN TWO--SIX PACKS OF CHG WIPES.  INSTRUCTIONS REVIEWED ON USE OF CHG WIPES.  PATIENT GIVEN SSI INFECTIONS SHEET; MRSA/MSSA TREATMENT INSTRUCTION SHEET GIVEN WITH AN EXPLANATION TO PATIENT THAT THEY WILL BE NOTIFIED IF TREATMENT INSTRUCTIONS NEED TO BE INITIATED.  PATIENT WAS GIVEN THE OPPORTUNITY TO ASK QUESTIONS; REGARDING THE INFORMATION PROVIDED.      PREOP DIET AND NUTRITION UPDATED GUIDELINES/ INSTRUCTIONS REVIEWED WITH PATIENT.  Marland Kitchen

## 2016-11-21 NOTE — Progress Notes (Signed)
Jillian Bean is a 76 y.o. female here for follow-up of breast cancer.

## 2016-11-21 NOTE — Progress Notes (Signed)
[facility]  [place] [address], [place]   [address]  W: [phone]   F: [phone]      F/u HEME/ONC CONSULT    Reason for visit: evaluation for treatment for breast cancer    Consulting physician: Dr. Gilford Rile    HPI:   Jillian Bean is a 76 y.o.  female who I was asked to see in consultation at the request of Dr. Elie Confer for evaluation for systemic therapy for breast cancer.    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 Right lumpectomy on 11/10/14 shows IDC, 1.7 cm, gr 3, 0/5 LN, no LVI.  PT1cN0Mx.    [place] q 3 weeks x 6: 12/21/14- 04/12/15  C#5 held 1 week on 03/15/15 due to thrombocytopenia   [place] Herceptin: 05/03/15-11/29/15    S/p XRT 06/03/15    Bone Marrow Bx 10/09/16: Variably normocellular marrow with trilineage hematopoiesis.    Interval History: In today for follow up. Complains of gr gr 1 fatigue, gr 1 pain to knee hip.  Is hoping for a right knee replacement with Dr. Elvin So on 11/26/16    07/25/16 hgb 8.7 -- now 9.8  06/20/16 hgb 11.5    DX   Encounter Diagnosis   Name Primary?   ??? Malignant neoplasm of right female breast, unspecified estrogen receptor status, unspecified site of breast ([place]) Yes     Past Medical History:   Diagnosis Date   ??? Anemia    ??? Back pain 09/01/2007   ??? Cancer (HCC)     R breast   ??? Coagulation disorder ([place])     IRON DEFFICIENCY ANEMIA   ??? Colonic polyps 08/31/1998   ??? 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 Hiatal hernia 07/11/2011    Dr.sobieski   ??? Hypercholesteremia 09/01/2007   ??? Hypothyroidism 09/01/2003   ??? 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 Nausea & vomiting    ??? OA (osteoarthritis) 09/01/2007   ??? Other ill-defined conditions(799.89)     VERTIGO   ??? Plantar fasciitis 09/01/2007    ??? S/P colonoscopy 10/10/2007   ??? Thyroid disease      Past Surgical History:   Procedure Laterality Date   ??? BREAST SURGERY PROCEDURE UNLISTED  1993    Breast Reduction   ??? COLONOSCOPY N/A 11/12/2016    COLONOSCOPY performed by Burnett Harry, MD at [facility]   ??? COLONOSCOPY,DIAGNOSTIC  11/12/2016        ??? ENDOSCOPY, COLON, DIAGNOSTIC  7/12/18/08    7/05 Dr Maudry Diego   ??? HX BREAST LUMPECTOMY Right 2016   ??? HX COLONOSCOPY  2014    Dr Illene Silver   ??? HX GI      COLONOSCOPY   ??? HX GYN      Hysterectomy in her 30's   ??? HX HEENT  04/07/10    thyroid biopsy neg dr Leward Quan   ??? HX HEENT      wisdom teeth extraction   ??? HX ORTHOPAEDIC  08/22/09    left TKR   ??? HX ORTHOPAEDIC  08/13/12    right tkr   ??? HX OTHER SURGICAL      MINIMALLY INVASIVE LUMBAR DECOMPRESSION   ??? UPPER GI  ENDOSCOPY,BIOPSY  11/12/2016          Social History     Social History   ??? Marital status: MARRIED     Spouse name: N/A   ??? Number of children: N/A   ??? Years of education: N/A     Social History Main Topics   ??? Smoking status: Never Smoker   ??? Smokeless tobacco: Never Used   ??? Alcohol use No   ??? Drug use: No   ??? Sexual activity: Not Currently     Other Topics Concern   ??? None     Social History Narrative     Family History   Problem Relation Age of Onset   ??? Cancer Mother      Breast   ??? Heart Failure Father      MI   ??? Seizures Brother      ms       Current Outpatient Prescriptions   Medication Sig Dispense Refill   ??? diclofenac EC (VOLTAREN) 50 mg EC tablet Take 1 Tab by mouth two (2) times daily as needed. 60 Tab 5   ??? raNITIdine (ZANTAC) 150 mg tablet Take 1 Tab by mouth two (2) times a day. 60 Tab 5   ??? alendronate (FOSAMAX) 70 mg tablet Take 1 Tab by mouth every seven (7) days. 4 Tab 11   ??? colesevelam (WELCHOL) 625 mg tablet 1 po bid 180 Tab 2   ??? levothyroxine (SYNTHROID) 112 mcg tablet 8 per week 100 Tab 3   ??? acetaminophen (TYLENOL) 500 mg tablet Take  by mouth every six (6) hours as needed for Pain.      ??? aspirin 81 mg chewable tablet Take 81 mg by mouth daily.     ??? multivitamin (ONE A DAY) tablet Take 1 Tab by mouth daily.     ??? cholecalciferol, vitamin d3, (VITAMIN D) 1,000 unit tablet Take  by mouth daily.     ??? docusate sodium (COLACE) 100 mg capsule Take 100 mg by mouth daily.       Allergies   Allergen Reactions   ??? Atorvastatin Myalgia   ??? Zocor [Simvastatin] Other (comments) and Diarrhea     Other reaction(s): Adverse reaction to substance   Myalgias     ??? Celebrex [Celecoxib] Other (comments)     Aggitation   ??? Gabapentin Other (comments)     FELT UNSTEADY ON MY FEET   ??? Levaquin [Levofloxacin] Palpitations   ??? Nexium [Esomeprazole Magnesium] Diarrhea   ??? Other Medication Swelling     Latanoprost drops   ??? Oxycontin [Oxycodone] Itching   ??? Pravastatin Nausea and Vomiting   ??? Yellow Dye Hives       Review of Systems    A comprehensive review of systems was performed and all systems were negative except for HPI and for the symptom report form, reviewed and scanned in.    Objective:  Physical Exam:  Visit Vitals   ??? BP 117/46   ??? Pulse 67   ??? Temp 98 ??F (36.7 ??C) (Temporal)   ??? Resp 18   ??? Ht '5\' 5"'  (1.651 m)   ??? Wt 144 lb 9.6 oz (65.6 kg)   ??? LMP 04/13/2010   ??? SpO2 97%   ??? BMI 24.06 kg/m2       General:  Alert, cooperative, no distress, appears stated age.   Head:  Normocephalic, without obvious abnormality, atraumatic.   Eyes:  Conjunctivae/corneas clear. PERRL, EOMs intact.  Throat: Lips, mucosa, and tongue normal.    Neck: Supple, symmetrical, trachea midline, no adenopathy, thyroid: no enlargement/tenderness/nodules   Back:   Symmetric, no curvature. ROM normal. No CVA tenderness.   Lungs:   Clear to auscultation bilaterally.   Chest wall:  No tenderness or deformity.   Heart:  Regular rate and rhythm, S1, S2 normal, no murmur, click, rub or gallop.   Breast Exam:  S/p R lumpectomy.    Abdomen:   Soft, non-tender. Bowel sounds normal. No masses,  No organomegaly.   Extremities: Right knee swollen    Skin: Improved macular rash on elbows, back, abdomen lower, upper thighs, back of legs   Lymph nodes: Cervical, supraclavicular, and axillary nodes normal.   Neurologic: CNII-XII intact.     Diagnostic Imaging     12/16/14 TTE: EF 71%.  03/02/15 TTE: EF 68%  05/30/15 TTE :  EF 65%  07/21/15 TTE: EF 66%  10/06/15 TTE: EF 62%  05/23/16 TTE EF 60%    03/15/16 dexa  Findings:  ????  Fractures identified on Lateral scanogram:  None  ????  Femoral Neck:  Right  Bone mineral density (gm/cm2):? 0.649  % of peak bone mass: 63  % for age matched controls:? 86  T-score: -2.8  Z-score: -0.8  ????  Total Hip: Right  Bone mineral density (gm/cm2):  0.761  % of peak bone mass:   76  % for age matched controls:  98  T-score:   -2.0  Z-score:  -0.1  ??  Lumbar Spine:  L1-L4  Bone mineral density (gm/cm2):  1.211  % of peak bone mass:  101   % for age matched controls:  124  T-score:  0.1  Z-score:  2.0  ??  The T score for the left distal third radius is -1.4.  ????  IMPRESSION  Impression:  ????  This patient is osteoporotic using the World Health Organization criteria  As compared to the prior study, there has been no change in the bone mineral  density of the right total hip and an increase in the bone mineral density of  the lumbar spine of 2.0%.  10 year probability of major osteoporotic fracture:  19.6%  10 year probability of hip fracture:  7.4%    09/20/16 CT c/a/p:  IMPRESSION:  No acute process in the chest, abdomen, and pelvis.  ??    Lab Results  Lab Results   Component Value Date/Time    WBC 5.0 11/07/2016 10:03 AM    HGB 9.8 (L) 11/07/2016 10:03 AM    HCT 31.6 (L) 11/07/2016 10:03 AM    PLATELET 259 11/07/2016 10:03 AM    MCV 88 11/07/2016 10:03 AM     Lab Results   Component Value Date/Time    Sodium 143 07/25/2016 02:57 PM    Potassium 4.1 07/25/2016 02:57 PM    Chloride 102 07/25/2016 02:57 PM    CO2 24 07/25/2016 02:57 PM    Anion gap 7 02/22/2015 09:56 AM    Glucose 86 07/25/2016 02:57 PM    BUN 21 07/25/2016 02:57 PM     Creatinine 0.70 07/25/2016 02:57 PM    BUN/Creatinine ratio 30 (H) 07/25/2016 02:57 PM    GFR est AA 97 07/25/2016 02:57 PM    GFR est non-AA 84 07/25/2016 02:57 PM    Calcium 9.1 07/25/2016 02:57 PM    AST (SGOT) 14 07/25/2016 02:57 PM    Alk. phosphatase 104 07/25/2016 02:57 PM    Protein,  total 6.9 07/25/2016 02:57 PM    Albumin 4.0 07/25/2016 02:57 PM    Globulin 2.5 04/12/2015 09:23 AM    A-G Ratio 1.4 07/25/2016 02:57 PM    ALT (SGPT) 14 07/25/2016 02:57 PM       Lab Results   Component Value Date/Time    Reticulocyte count 1.5 09/05/2016 09:40 AM    Iron % saturation 22 07/30/2016 09:31 AM    TIBC 161 (L) 07/30/2016 09:31 AM    Ferritin 1982 (H) 07/30/2016 09:31 AM    Vitamin B12 542 07/30/2016 09:31 AM    Folate 14.8 07/30/2016 09:31 AM    Haptoglobin 412 (H) 09/05/2016 09:40 AM    LD 192 09/05/2016 09:40 AM    TSH 2.560 07/25/2016 02:57 PM     Lab Results   Component Value Date/Time    INR 1.5 (H) 09/05/2012 03:53 AM    aPTT 28.7 08/18/2009 01:05 PM       Assessment/Plan:  76 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  PS 0    1. Right Breast cancer stage: IA    Hormonal therapy: not indicated due to receptor (-) status    We explained to the patient that the goal of systemic adjuvant therapy is to improve the chances for cure and decrease the risk of relapse. We explained why a patient can have microscopic cancer spread now even though physical examination, laboratory studies and imaging studies are negative for cancer. We explained that the same treatments used now as adjuvant or preventive treatments rarely if ever are curative in women who develop metastases.     No evidence of recurrence    Mammogram in March 2018 negative at [facility], due in March 2019    She declined to take neratinib.    2. Emotional well being: She has excellent support and is coping well with her disease.     3. FH of breast cancer: BRCA 1/2 [place] testing negative    4. Osteoporosis: On dexa from 2015; is now on fosamax from Dr. Wanda Plump, started 08/2016. DEXA 03/15/16 showed some improvement    5. Back pain low/radiculopathy: Dr. Dema Severin, pain specialist, was following her. NM bone scan and xr show degeneration. Steroid injection in of Octobert; L-spine MRI 01/19/16 shows DDD.  States she had another steroid injection 09/24/16.     6. Anemia: Hgb 9.8, improving Normocytic; unclear as to drop since Nov, likely due to chemo as now is improving.  Elevated ferritin as well.  No B12 def, normal folate, normal iron studies.  Normal TSH    The differential diagnosis for a normocytic anemia is broad, and includes blood loss, anemia of chronic disease, chronic renal insufficiency, iron deficiency, hypothyroidism, hemolysis, and bone marrow suppression.  MDS, B12 deficiency, folate deficiency, and etoh abuse can also lead to anemia, though it is generally macrocytic.      Bone Marrow bx 10/02/16: normal. Referred to GI -- Dr. Alita Chyle, on 11/12/16 EGD showed atrophic gastritis and hiatal hernia and colonoscopy showed int hemorrhoids and diverticulosis.      Will recheck again in one month.  Ok to proceed with knee surgery. Transfuse for hgb < 7 or per protocol.   Would avoid EPO with history of breast cancer.  Anemia improving, but unclear cause.  Unlikely related to chemotherapy as this occurred a year from chemo.  May have been a episode of gastritis?  Would  recommend capsule study at some point with Dr. Alita Chyle.    Will repeat CBC in 2 months    Thank you for this consult.  All of the patient's questions were answered today.        Follow-up Disposition: Not on File    Sonda Rumble, MD

## 2016-11-22 LAB — CULTURE, MRSA

## 2016-11-22 NOTE — Other (Signed)
CALLED DR Elvin So OFFICE AND LEFT A VOICEMAIL FOR MARY ABOUT ABNORMAL LABS, EKG  ALL LABS, EKG  HAVE BEEN FAXED OVER TO OFFICE.

## 2016-11-23 LAB — CULTURE, URINE
Colonies Counted: >100000
Colony Count: >100000

## 2016-11-23 NOTE — Other (Signed)
CALLED DR DOBZYNIAK OFFICE ,NURSE SHARON NOTIFIED ABOUT ABNORMAL LABS HGB 8.4, URINE 4+ BACTERIA ,CULTURE  IS   STILL PENDING SHARON SAID MD IS AWARE AND WILL TAKE CARE OF IT .

## 2016-11-26 ENCOUNTER — Inpatient Hospital Stay
Admit: 2016-11-26 | Discharge: 2016-12-04 | Disposition: A | Discharge status: Skilled Nursing Facility | Payer: MEDICARE | Attending: Specialist | Admitting: Specialist

## 2016-11-26 DIAGNOSIS — T8453XA Infection and inflammatory reaction due to internal right knee prosthesis, initial encounter: Secondary | ICD-10-CM

## 2016-11-26 LAB — GLUCOSE, POC: Glucose (POC): 84 mg/dL (ref 65–100)

## 2016-11-26 MED ORDER — ONDANSETRON (PF) 4 MG/2 ML INJECTION
4 mg/2 mL | INTRAMUSCULAR | Status: DC | PRN
Start: 2016-11-26 — End: 2016-11-26

## 2016-11-26 MED ORDER — FENTANYL CITRATE (PF) 50 MCG/ML IJ SOLN
50 mcg/mL | INTRAMUSCULAR | Status: DC | PRN
Start: 2016-11-26 — End: 2016-11-26

## 2016-11-26 MED ORDER — FENTANYL CITRATE (PF) 50 MCG/ML IJ SOLN
50 mcg/mL | INTRAMUSCULAR | Status: AC
Start: 2016-11-26 — End: ?

## 2016-11-26 MED ORDER — VANCOMYCIN 1,000 MG IV SOLR
1000 mg | INTRAVENOUS | Status: DC | PRN
Start: 2016-11-26 — End: 2016-11-26
  Administered 2016-11-26: 21:00:00

## 2016-11-26 MED ORDER — ACETAMINOPHEN 500 MG TAB
500 mg | Freq: Once | ORAL | Status: AC
Start: 2016-11-26 — End: 2016-11-26
  Administered 2016-11-26: 20:00:00 via ORAL

## 2016-11-26 MED ORDER — SODIUM CHLORIDE 0.9 % IV
INTRAVENOUS | Status: DC | PRN
Start: 2016-11-26 — End: 2016-11-26
  Administered 2016-11-26: 22:00:00 via INTRAVENOUS

## 2016-11-26 MED ORDER — BUPIVACAINE (PF) 0.5 % (5 MG/ML) IJ SOLN
0.5 % (5 mg/mL) | INTRAMUSCULAR | Status: AC
Start: 2016-11-26 — End: ?

## 2016-11-26 MED ORDER — BUPIVACAINE LIPOSOME (PF) 266 MG/20 ML (13.3 MG/ML) SUSP, INFILTRATION
1.3 % (13.3 mg/mL) | Status: AC
Start: 2016-11-26 — End: ?

## 2016-11-26 MED ORDER — SODIUM CHLORIDE 0.9 % IJ SYRG
Freq: Three times a day (TID) | INTRAMUSCULAR | Status: DC
Start: 2016-11-26 — End: 2016-11-26
  Administered 2016-11-26: 19:00:00 via INTRAVENOUS

## 2016-11-26 MED ORDER — OXYCODONE-ACETAMINOPHEN 5 MG-325 MG TAB
5-325 mg | ORAL | Status: DC | PRN
Start: 2016-11-26 — End: 2016-11-26

## 2016-11-26 MED ORDER — VANCOMYCIN 500 MG IV SOLR
500 mg | INTRAVENOUS | Status: AC
Start: 2016-11-26 — End: ?

## 2016-11-26 MED ORDER — MIDAZOLAM 1 MG/ML IJ SOLN
1 mg/mL | INTRAMUSCULAR | Status: DC | PRN
Start: 2016-11-26 — End: 2016-11-26
  Administered 2016-11-26: 20:00:00 via INTRAVENOUS

## 2016-11-26 MED ORDER — ONDANSETRON (PF) 4 MG/2 ML INJECTION
4 mg/2 mL | INTRAMUSCULAR | Status: DC | PRN
Start: 2016-11-26 — End: 2016-11-26
  Administered 2016-11-26: 22:00:00 via INTRAVENOUS

## 2016-11-26 MED ORDER — ONDANSETRON (PF) 4 MG/2 ML INJECTION
4 mg/2 mL | INTRAMUSCULAR | Status: AC
Start: 2016-11-26 — End: ?

## 2016-11-26 MED ORDER — LACTATED RINGERS IV
INTRAVENOUS | Status: DC | PRN
Start: 2016-11-26 — End: 2016-11-26
  Administered 2016-11-26 (×2): via INTRAVENOUS

## 2016-11-26 MED ORDER — BUPIVACAINE (PF) 0.75 % (7.5 MG/ML) IJ SOLN
0.75 % (7.5 mg/mL) | INTRAMUSCULAR | Status: AC
Start: 2016-11-26 — End: ?

## 2016-11-26 MED ORDER — VANCOMYCIN 1,000 MG IV SOLR
1000 mg | INTRAVENOUS | Status: AC
Start: 2016-11-26 — End: ?

## 2016-11-26 MED ORDER — PROPOFOL 10 MG/ML IV EMUL
10 mg/mL | INTRAVENOUS | Status: DC | PRN
Start: 2016-11-26 — End: 2016-11-26
  Administered 2016-11-26 (×2): via INTRAVENOUS

## 2016-11-26 MED ORDER — TOBRAMYCIN SULFATE 1.2 G IJ SOLR
1.2 gram | INTRAMUSCULAR | Status: DC | PRN
Start: 2016-11-26 — End: 2016-11-26
  Administered 2016-11-26: 21:00:00

## 2016-11-26 MED ORDER — ROPIVACAINE (PF) 5 MG/ML (0.5 %) INJECTION
5 mg/mL (0. %) | INTRAMUSCULAR | Status: AC
Start: 2016-11-26 — End: ?

## 2016-11-26 MED ORDER — CEFAZOLIN 2 GRAM/20 ML IN STERILE WATER INTRAVENOUS SYRINGE
2 gram/0 mL | Freq: Once | INTRAVENOUS | Status: AC
Start: 2016-11-26 — End: 2016-11-26
  Administered 2016-11-26: 20:00:00 via INTRAVENOUS

## 2016-11-26 MED ORDER — SODIUM CHLORIDE 0.9 % IV
1.313.3 % (13.3 mg/mL) | INTRAVENOUS | Status: DC | PRN
Start: 2016-11-26 — End: 2016-11-26
  Administered 2016-11-26: 22:00:00 via SUBCUTANEOUS

## 2016-11-26 MED ORDER — MORPHINE 10 MG/ML INJ SOLUTION
10 mg/ml | INTRAMUSCULAR | Status: DC | PRN
Start: 2016-11-26 — End: 2016-11-26

## 2016-11-26 MED ORDER — DIPHENHYDRAMINE HCL 50 MG/ML IJ SOLN
50 mg/mL | INTRAMUSCULAR | Status: DC | PRN
Start: 2016-11-26 — End: 2016-11-26

## 2016-11-26 MED ORDER — MIDAZOLAM 1 MG/ML IJ SOLN
1 mg/mL | INTRAMUSCULAR | Status: AC
Start: 2016-11-26 — End: ?

## 2016-11-26 MED ORDER — SODIUM CHLORIDE 0.9 % IV
INTRAVENOUS | Status: DC
Start: 2016-11-26 — End: 2016-11-26
  Administered 2016-11-26: 19:00:00 via INTRAVENOUS

## 2016-11-26 MED ORDER — PROPOFOL 10 MG/ML IV EMUL
10 mg/mL | INTRAVENOUS | Status: DC | PRN
Start: 2016-11-26 — End: 2016-11-26
  Administered 2016-11-26 (×3): via INTRAVENOUS

## 2016-11-26 MED ORDER — PHENYLEPHRINE 10 MG/ML INJECTION
10 mg/mL | INTRAMUSCULAR | Status: DC | PRN
Start: 2016-11-26 — End: 2016-11-26
  Administered 2016-11-26 (×2): via INTRAVENOUS

## 2016-11-26 MED ORDER — LACTATED RINGERS IV
INTRAVENOUS | Status: DC
Start: 2016-11-26 — End: 2016-11-26
  Administered 2016-11-27: via INTRAVENOUS

## 2016-11-26 MED ORDER — BACITRACIN 50,000 UNIT IM
50000 unit | INTRAMUSCULAR | Status: DC | PRN
Start: 2016-11-26 — End: 2016-11-26
  Administered 2016-11-26: 22:00:00

## 2016-11-26 MED ORDER — SODIUM CHLORIDE 0.9 % IV
INTRAVENOUS | Status: AC
Start: 2016-11-26 — End: 2016-11-27
  Administered 2016-11-26 – 2016-11-27 (×2): via INTRAVENOUS

## 2016-11-26 MED ORDER — TOBRAMYCIN SULFATE 1.2 G IJ SOLR
1.2 gram | INTRAMUSCULAR | Status: AC
Start: 2016-11-26 — End: ?

## 2016-11-26 MED ORDER — FENTANYL CITRATE (PF) 50 MCG/ML IJ SOLN
50 mcg/mL | INTRAMUSCULAR | Status: DC | PRN
Start: 2016-11-26 — End: 2016-11-26
  Administered 2016-11-26 (×4): via INTRAVENOUS

## 2016-11-26 MED ORDER — SODIUM CHLORIDE 0.9 % IV
1000 mg/10 mL (100 mg/mL) | Freq: Once | INTRAVENOUS | Status: AC
Start: 2016-11-26 — End: 2016-11-26
  Administered 2016-11-26: 20:00:00 via INTRAVENOUS

## 2016-11-26 MED ORDER — MIDAZOLAM 1 MG/ML IJ SOLN
1 mg/mL | INTRAMUSCULAR | Status: DC | PRN
Start: 2016-11-26 — End: 2016-11-26

## 2016-11-26 MED ORDER — SODIUM CHLORIDE 0.9 % IJ SYRG
INTRAMUSCULAR | Status: DC | PRN
Start: 2016-11-26 — End: 2016-11-26

## 2016-11-26 MED ORDER — BUPIVACAINE (PF) 0.5 % (5 MG/ML) IJ SOLN
0.5 % (5 mg/mL) | INTRAMUSCULAR | Status: DC | PRN
Start: 2016-11-26 — End: 2016-11-26
  Administered 2016-11-26: 20:00:00 via INTRATHECAL

## 2016-11-26 MED ORDER — DEXAMETHASONE SODIUM PHOSPHATE 4 MG/ML IJ SOLN
4 mg/mL | Freq: Once | INTRAMUSCULAR | Status: DC
Start: 2016-11-26 — End: 2016-11-26
  Administered 2016-11-26: 19:00:00 via INTRAVENOUS

## 2016-11-26 MED ORDER — LIDOCAINE (PF) 10 MG/ML (1 %) IJ SOLN
10 mg/mL (1 %) | INTRAMUSCULAR | Status: DC | PRN
Start: 2016-11-26 — End: 2016-11-26

## 2016-11-26 MED ORDER — LACTATED RINGERS IV
INTRAVENOUS | Status: DC
Start: 2016-11-26 — End: 2016-11-26
  Administered 2016-11-26: 20:00:00 via INTRAVENOUS

## 2016-11-26 MED FILL — SENSORCAINE-MPF 0.5 % (5 MG/ML) INJECTION SOLUTION: 0.5 % (5 mg/mL) | INTRAMUSCULAR | Qty: 30

## 2016-11-26 MED FILL — ONDANSETRON (PF) 4 MG/2 ML INJECTION: 4 mg/2 mL | INTRAMUSCULAR | Qty: 2

## 2016-11-26 MED FILL — VANCOMYCIN 1,000 MG IV SOLR: 1000 mg | INTRAVENOUS | Qty: 2000

## 2016-11-26 MED FILL — TOBRAMYCIN SULFATE 1.2 G IJ SOLR: 1.2 gram | INTRAMUSCULAR | Qty: 90

## 2016-11-26 MED FILL — FENTANYL CITRATE (PF) 50 MCG/ML IJ SOLN: 50 mcg/mL | INTRAMUSCULAR | Qty: 2

## 2016-11-26 MED FILL — LACTATED RINGERS IV: INTRAVENOUS | Qty: 1000

## 2016-11-26 MED FILL — NAROPIN (PF) 5 MG/ML (0.5 %) INJECTION SOLUTION: 5 mg/mL (0. %) | INTRAMUSCULAR | Qty: 30

## 2016-11-26 MED FILL — VANCOMYCIN 500 MG IV SOLR: 500 mg | INTRAVENOUS | Qty: 1000

## 2016-11-26 MED FILL — VANCOMYCIN 1,000 MG IV SOLR: 1000 mg | INTRAVENOUS | Qty: 1000

## 2016-11-26 MED FILL — CEFAZOLIN 2 GRAM/20 ML IN STERILE WATER INTRAVENOUS SYRINGE: 2 gram/0 mL | INTRAVENOUS | Qty: 20

## 2016-11-26 MED FILL — EXPAREL (PF) 1.3 % (13.3 MG/ML) SUSPENSION FOR LOCAL INFILTRATION: 1.3 % (13.3 mg/mL) | Qty: 20

## 2016-11-26 MED FILL — MIDAZOLAM 1 MG/ML IJ SOLN: 1 mg/mL | INTRAMUSCULAR | Qty: 5

## 2016-11-26 MED FILL — ACETAMINOPHEN 500 MG TAB: 500 mg | ORAL | Qty: 2

## 2016-11-26 MED FILL — SODIUM CHLORIDE 0.9 % IV: INTRAVENOUS | Qty: 1000

## 2016-11-26 MED FILL — VANCOMYCIN 500 MG IV SOLR: 500 mg | INTRAVENOUS | Qty: 500

## 2016-11-26 MED FILL — BUPIVACAINE (PF) 0.75 % (7.5 MG/ML) IJ SOLN: 0.75 % (7.5 mg/mL) | INTRAMUSCULAR | Qty: 30

## 2016-11-26 NOTE — Anesthesia Procedure Notes (Signed)
Spinal Block    Start time: 11/26/2016 4:10 PM  End time: 11/26/2016 4:16 PM  Performed by: Jarold Motto  Authorized by: Irene Pap     Pre-procedure:  Preanesthetic Checklist: patient identified, risks and benefits discussed, anesthesia consent, site marked, patient being monitored and timeout performed    Timeout Time: 16:10          Spinal Block:   Patient Position:  Seated  Prep Region:  Lumbar  Prep: Betadine      Location:  L1-2  Technique:  Single shot  Local:  Lidocaine 1%  Local Dose (mL):  2    Needle:   Needle Type:  Pencil-tip  Needle Gauge:  25 G  Attempts:  1      Events: CSF confirmed, no blood with aspiration and no paresthesia        Assessment:  Insertion:  Uncomplicated  Patient tolerance:  Patient tolerated the procedure well with no immediate complications

## 2016-11-26 NOTE — Op Note (Signed)
[facility] REPORT    Name:Buchner, SHEILLA MARIS  MR#: [id number]  DOB: 12-26-1940  ACCOUNT #: [id number]   DATE OF SERVICE: 11/26/2016    PREOPERATIVE DIAGNOSIS:  Infected total knee arthroplasty, right knee.     POSTOPERATIVE DIAGNOSIS:  Infected total knee arthroplasty, right knee.      PROCEDURE PERFORMED:  Removal of infected total knee, placement of antibiotic spacer.      SURGEON:  Zollie Beckers, MD     ASSISTANT:  Georges Mouse     ANESTHESIA:  Spinal.    ESTIMATED BLOOD LOSS:  Minimal.    COMPLICATIONS:  None.    SPECIMENS REMOVED:  Multiple sets of cultures were sent.     IMPLANTS:  10 depuy poly.  Small femoral component.     INDICATIONS:  A 76 year old woman with continued pain after right total knee.  Her x-ray showed loose tibial component with significant proximal tibial osteolysis.  Aspiration was significant for elevated white blood cell count with predominance of polys.  Working diagnosis at that point was infected right total knee.    PROCEDURE:  Anesthetic was initiated.  Right lower extremity was then prepped and draped in the usual sterile fashion.  Preoperative dose of antibiotic was given.  Limb was exsanguinated, tourniquet was inflated.  Used her old incision and a medial arthrotomy was made, continuing into a mid vastus approach to the knee.  There was gross purulence coming at the bone-prosthetic interface.  At this point, the knee was exposed.  Soft tissues were removed.  Complete synovectomy was performed.  Medial and lateral gutters were reconstituted.  The polyethylene was removed.  Femur was exposed.  Osteotomes and saw were used to remove femoral component with minimal bone loss.  All lytic material was removed.  All cement was removed.  Copiously irrigated the femur and then entered the femoral medullary canal, copiously irrigated the canal.      Turned attention towards the tibia.  Tibia was grossly loose, it was removed, all cement was removed.   There was perforation of the tibia anteriorly from the chronic infection, with pus coming out of the anterior tibia.  All devitalized tissue was curetted.  Posterior synovectomy was performed.  Knee joint was copiously irrigated.  The patella component was removed with a saw and then all cement was removed from the patella.  Copiously irrigated the patella.  After final irrigation, a Prostalac mold was used to form a femoral component, and all polyethylene tibia was cemented into the tibia.  The Prostalac was cemented into place.  The knee joint was then copiously irrigated.  I did place beads in the canals of both the femur and the tibia.  After the cement had fully cured, the knee was ranged, irrigated copiously with pulsatile lavage.  Soft tissues were infiltrated with local anesthetic.  The arthrotomy was closed with heavy Vicryl sutures, skin and subcu were irrigated and closed in standard fashion.  Sterile dressing was applied.  There were no complications.    Specimen:  Cultures were sent.    Procedure:  Removal of infected total knee replacement and placement of antibiotic spacer.      The patient tolerated the procedure well and was taken to the recovery room stable.      [name], MD       MAD / PN  D: 11/27/2016 13:04     T: 11/27/2016 16:32  JOB #: [id number]

## 2016-11-26 NOTE — ACP (Advance Care Planning) (Signed)
Surgery History and Physical    Subjective:      Jillian Bean is a 76 y.o. female who presents for evaluation of Right Knee.   Possible infected   TKA.    Past Medical History:   Diagnosis Date   ??? Anemia    ??? Back pain 09/01/2007   ??? Cancer (HCC)     R breast   ??? Chronic pain     BACK PAIN   ??? Coagulation disorder ([place])     IRON DEFFICIENCY ANEMIA   ??? Colonic polyps 08/31/1998   ??? 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 Hiatal hernia 07/11/2011    Dr.sobieski   ??? Hypercholesteremia 09/01/2007   ??? Hypothyroidism 09/01/2003   ??? 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 Nausea & vomiting    ??? OA (osteoarthritis) 09/01/2007   ??? Other ill-defined conditions(799.89)     VERTIGO   ??? Plantar fasciitis 09/01/2007   ??? S/P colonoscopy 10/10/2007   ??? Thyroid disease      Past Surgical History:   Procedure Laterality Date   ??? BREAST SURGERY PROCEDURE UNLISTED  1993    Breast Reduction   ??? COLONOSCOPY N/A 11/12/2016    COLONOSCOPY performed by Burnett Harry, MD at [place]   ??? COLONOSCOPY,DIAGNOSTIC  11/12/2016        ??? ENDOSCOPY, COLON, DIAGNOSTIC  7/12/18/08    7/05 Dr Maudry Diego   ??? HX BREAST LUMPECTOMY Right 2016   ??? HX COLONOSCOPY  2014    Dr Illene Silver   ??? HX GI      COLONOSCOPY   ??? HX GYN      Hysterectomy in her 72's   ??? HX HEENT  04/07/10    thyroid biopsy neg dr Leward Quan   ??? HX HEENT      wisdom teeth extraction   ??? HX ORTHOPAEDIC  08/22/09    left TKR   ??? HX ORTHOPAEDIC  08/13/12    right tkr   ??? HX OTHER SURGICAL  2013    MINIMALLY INVASIVE LUMBAR DECOMPRESSION   ??? UPPER GI ENDOSCOPY,BIOPSY  11/12/2016           Family History   Problem Relation Age of Onset   ??? Cancer Mother      Breast   ??? Heart Failure Father      MI   ??? Seizures Brother      ms   ??? Anesth Problems Neg Hx      Social History   Substance Use Topics   ??? Smoking status: Never Smoker   ??? Smokeless tobacco: Never Used   ??? Alcohol use No      Prior to Admission  medications    Medication Sig Start Date End Date Taking? Authorizing Provider   OTHER     Historical Provider   brimonidine-timolol (COMBIGAN) 0.2-0.5 % drop ophthalmic solution Administer 1 Drop to both eyes every twelve (12) hours.    Historical Provider   diclofenac EC (VOLTAREN) 50 mg EC tablet Take 1 Tab by mouth two (2) times daily as needed. 08/27/16   [name], MD   raNITIdine (ZANTAC) 150 mg tablet Take 1 Tab by mouth two (2) times a day. 08/27/16   [name], MD   alendronate (FOSAMAX) 70 mg tablet Take 1 Tab by mouth every seven (7) days. 07/25/16   Lowry Bowl  Wanda Plump, MD   colesevelam [facility]) 625 mg tablet 1 po bid 05/04/16   [name], MD   levothyroxine (SYNTHROID) 112 mcg tablet 8 per week 02/16/16   [name], MD   acetaminophen (TYLENOL) 500 mg tablet Take  by mouth every six (6) hours as needed for Pain.    Historical Provider   docusate sodium (COLACE) 100 mg capsule Take 100 mg by mouth daily.    Historical Provider   aspirin 81 mg chewable tablet Take 81 mg by mouth daily.    Historical Provider   multivitamin (ONE A DAY) tablet Take 1 Tab by mouth daily.    Historical Provider      Allergies   Allergen Reactions   ??? Atorvastatin Myalgia   ??? Zocor [Simvastatin] Other (comments) and Diarrhea     Other reaction(s): Adverse reaction to substance   Myalgias     ??? Celebrex [Celecoxib] Other (comments)     Aggitation   ??? Gabapentin Other (comments)     FELT UNSTEADY ON MY FEET   ??? Levaquin [Levofloxacin] Palpitations   ??? Nexium [Esomeprazole Magnesium] Diarrhea   ??? Other Medication Swelling     Latanoprost drops   ??? Oxycontin [Oxycodone] Itching   ??? Pravastatin Nausea and Vomiting   ??? Yellow Dye Hives       Review of Systems:  A comprehensive review of systems was negative except for that written in the History of Present Illness.    Objective:      No data found.      No data recorded.      Physical Exam:  LUNG: clear to auscultation bilaterally, HEART: regular rate and  rhythm, ABDOMEN: soft, non-tender.  The right knee was marked for surgery.    Assessment:     Infected TKA.    Plan:     Removal versus revision TKA.    Signed By: Zollie Beckers, MD     November 26, 2016

## 2016-11-26 NOTE — Brief Op Note (Signed)
BRIEF OPERATIVE NOTE    Date of Procedure: 11/26/2016   Preoperative Diagnosis: FAILED TOTAL RIGHT KNEE REPLACEMENT  Postoperative Diagnosis: FAILED TOTAL RIGHT KNEE REPLACEMENT    Procedure(s):   REMOVAL BOTH COMPONENTS RIGHT KNEE, REMOVAL INFECTED RIGHT KNEE, REVISION RIGHT TOTAL KNEE  Surgeon(s) and Role:     * Zollie Beckers, MD - Primary         Surgical Assistant: [place]    Surgical Staff:  Circ-1: Vicenta Dunning, RN  Physician Assistant: Raynelle Highland, PA  Scrub Tech-1: Ilean China  Surg Asst-1: Cheree Ditto  Float Staff: Gwyneth Revels, RN  Event Time In   Incision Start 1636   Incision Close      Anesthesia: Spinal   Estimated Blood Loss: minimal  Specimens:   ID Type Source Tests Collected by Time Destination   1 : right femoral canal Joint Fluid Joint, Knee AEROBIC/ANAEROBIC CULTURE Zollie Beckers, MD 11/26/2016 1703 Microbiology   2 : right tibial canal Joint Fluid Joint, Knee AEROBIC/ANAEROBIC CULTURE Zollie Beckers, MD 11/26/2016 1706 Microbiology      Findings: [place]ney [place] Infection   Complications: none  Implants:   Implant Name Type Inv. Item Serial No. Manufacturer Lot No. LRB No. Used Action   CEMENT BNE GENTAMC GHV 40GM -- SMARTSET - SNA Cement CEMENT BNE GENTAMC GHV 40GM -- SMARTSET NA [employer] [id number] Right [id number] Implanted   GRAFT BNE PASTE RAPID CUR 10ML -- STIMULAN - SNA Other GRAFT BNE PASTE RAPID CUR 10ML -- STIMULAN [employer] [id number] Right 1 Implanted   GRAFT BNE PASTE RAPID CURE 5ML -- STIMULAN - SNA Other GRAFT BNE PASTE RAPID CURE 5ML -- STIMULAN NA [employer] [id number] Right 1 Implanted   pfc sigma tibial component all poly curved size 2 67mm cemented Joint Component   NA [employer] [id number] Right 1 Implanted

## 2016-11-26 NOTE — H&P (Shared)
Jillian Bean  Location: [facility]  Patient #: [id number]  DOB: October 27, 1940  Married / Language: English / Race: White  Female      History of Present Illness  The patient is a 76 year old female who presents with a complaint of left knee and left hip pain. The patient comes in with a history of right knee pain and swelling. ??The patient was seen on 11/07/2015 with some knee discomfort. ??Patient had an aspiration which showed no evidence of infection. ??The TNC Was 54,146. ??The patient denies any fever. ??The patient states that she is having increased discomfort and swelling in the right knee. 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    Patient had a left total knee replacement in 2011 and has no discomfort or swelling in the left side.    Patient has a history of breast cancer. ??The patient also has a history of anemia. ??Patient states her last hemoglobin was 9.4. ??Patient states that she has had a GI workup that has been normal.      Problem List/Past Medical   Dietary counseling (V65.3) ??  STENOSIS (724.02) ??  Low back pain (724.2) (724.2   M54.5) ??  PAIN, HIP (719.45) ??  REVIEW OF SYSTEMS: Systems were reviewed by the provider. ??  H/O total knee replacement, bilateral (V43.65   [id number]) ??  Greater trochanteric bursitis of right hip (726.5   M70.61) ??  Pain and swelling of knee, right (719.46, 719.06   M25.561, M25.461) ??  DDD (722.52) ??  OA, KNEE (715.96) ??    Allergies  Levaquin *FLUOROQUINOLONES* ??  Gabapentin *ANTICONVULSANTS* ??  CeleBREX *ANALGESICS - ANTI-INFLAMMATORY* ??  NexIUM *ULCER DRUGS* ??  OxyCODONE HCl (Abuse Deter) *ANALGESICS - OPIOID* ??  Statins ??  Zocor *ANTIHYPERLIPIDEMICS* ??    Social History   Tobacco use ?? Never smoker. 07/26/2011: Never smoker.  Exercise ?? 07/26/2011: Does aerobics.  Caffeine use ?? 07/26/2011: Drinks caffeinated beverages 1-2 times a day.  HIV risk factors ?? 07/26/2011: no  Seat Belt Use ?? 04/54/0981: always   Illicit drug use ?? 19/14/7829: none  Alcohol use ?? 07/26/2011: Never drinks.  Tobacco / smoke exposure ?? 07/26/2011: No  Sun Exposure ?? 07/26/2011: occasionally    Medication History  Amoxicillin?? (500MG  Tablet, 4 (four) Tablet Oral Take 4 tablets 1 hour prior to dental procedure, Taken starting 03/29/2014) Active.  Synthroid?? (112MCG Tablet, Oral) Active.  Multiple Vitamin?? (1 Oral) Active.  Latanoprost?? (0.005% Solution, Ophthalmic) Active.  Diclofenac Potassium?? (50MG  Tablet, Oral) Active.  Aspirin?? (81MG  Tablet, Oral) Active.  Medications Reconciled??      Review of Systems   General Present- Fatigue and Seasonal Allergies. Not Present- Appetite Loss, Chills, Fever, HIV Exposure, Night Sweats, Persistent Infections, Weight Gain and Weight Loss.  Skin Not Present- Itching, Nail Changes, Poor Wound Healing, Rash, Skin Color Changes, Suspicious Lesions and Yellowish Skin Color.  HEENT Not Present- Decreased Hearing, Earache, Hoarseness, Loose Teeth, Nose Bleed, Ringing in the Ears and Sore Throat.  Respiratory Not Present- Bloody sputum, Chronic Cough, Difficulty Breathing, Snoring, Wakes up from Sleep Wheezing or Short of Breath and Wheezing.  Cardiovascular Not Present- Bluish Discoloration Of Lips Or Nails, Chest Pain, Difficulty Breathing Lying Down, Difficulty Breathing On Exertion, Leg Cramps With Exertion, Palpitations and Swelling of Extremities.  Gastrointestinal Present- Constipation. Not Present- Abdominal Pain, Black, Tarry Stool, Change in Bowel Habits, Cirrhosis, Diarrhea, Difficulty Swallowing, Nausea and Vomiting.  Female Genitourinary Not Present- Blood  in Urine, Frequency, Painful Urination, Pelvic Pain, Trouble Starting Urinary Stream and Urgency.  Musculoskeletal Present- Back Pain, Joint Pain, Joint Stiffness and Joint Swelling.  Neurological Present- Tingling. Not Present- Fainting, Headaches, Memory Loss, Numbness, Seizures, Tremor, Unsteadiness and Weakness.   Psychiatric Not Present- Anxiety, Bipolar and Depression.  Endocrine Present- Cold Intolerance. Not Present- Excessive Hunger, Excessive Thirst, Excessive Urination and Heat Intolerance.  Hematology Not Present- Abnormal Bruising , Enlarged Lymph Nodes, Excessive bleeding and Skin Discoloration.    Vitals  Weight: 143 lb???? Height: 65??in??  Weight was reported by patient.  Height was reported by patient.  Body Surface Area: 1.72 m?????? Body Mass Index: 23.8 kg/m?? ??      Physical Exam   The physical exam findings are as follows:  Note:??The patient is very pleasant. A single transfer and walk with a slight limp on the right side. The patient has moderate swelling in the right knee. Range of motion is from full extension to 120?? of flexion with some laxity to varus valgus testing.    Examination of the left knee is from full extension to 130?? of flexion with no instability findings.    Motor, sensory, circulation exam. Lower extremities is normal.      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  MAJOR JOINT - KNEE, HIP, SHOULDER (60454)  Note:??X-rays 2 views of the right knee shows evidence of loosening of the tibial component. ??Patient has lysis around all 3 compartments of the tibia and implant.    X-rays 2 views of the left knee shows some slight radiolucency about the medial aspect of the tibial tray. ??There is no evidence of gross loosening.    Patient has signs consistent with loosening of ??The tibial component. ??Aspiration on 11/07/2015 revealed no growth.    The patient is indicated for right knee surgery. ??The patient medically has a history of breast cancer, and chronic anemia. ??The anemia is  presently being worked up. ??Procedure, risks, and rehabilitation concerns and then addressed. ??The patient will be seen in consultation with Dr. Elvin So.    The patient will have repeat aspiration for cultures and cell count of the right knee today.    The right knee was aspirated and approximately 20 cc of purulent fluid was removed. ??The patient has findings consistent with an infection of the right knee. ??Patient be set up for a two-stage revision. ??Procedure and risks have been discussed.

## 2016-11-26 NOTE — Progress Notes (Signed)
TRANSFER - IN REPORT:    Verbal report received from [place], RN(name) on Jillian Bean  being received from [employer]) for routine post - op      Report consisted of patient???s Situation, Background, Assessment and   Recommendations(SBAR).     Information from the following report(s) SBAR was reviewed with the receiving nurse.    Opportunity for questions and clarification was provided.      Assessment completed upon patient???s arrival to unit and care assumed.

## 2016-11-26 NOTE — Anesthesia Post-Procedure Evaluation (Signed)
Post-Anesthesia Evaluation and Assessment    Patient: Jillian Bean MRN: [id number]  SSN: [id number]    Date of Birth: [birth year]  Age: 76 y.o.  Sex: female       Cardiovascular Function/Vital Signs  Visit Vitals   ??? BP 124/54   ??? Pulse 76   ??? Temp 36.6 ??C (97.8 ??F)   ??? Resp 12   ??? SpO2 98%       Patient is status post spinal anesthesia for Procedure(s):   REMOVAL BOTH COMPONENTS RIGHT KNEE, REMOVAL INFECTED RIGHT KNEE, REVISION RIGHT TOTAL KNEE.    Nausea/Vomiting: None    Postoperative hydration reviewed and adequate.    Pain:  Pain Scale 1: Numeric (0 - 10) (11/26/16 1515)  Pain Intensity 1: 0 (11/26/16 1515)   Managed    Neurological Status:   Neuro (WDL): Within Defined Limits (11/26/16 1518)   At baseline    Mental Status and Level of Consciousness: Arousable    Pulmonary Status:   O2 Device: CO2 nasal cannula (11/26/16 1832)   Adequate oxygenation and airway patent    Complications related to anesthesia: None    Post-anesthesia assessment completed. No concerns    Signed By: Irene Pap, MD     November 26, 2016

## 2016-11-26 NOTE — ACP (Advance Care Planning) (Signed)
Surgery History and Physical    Subjective:      Jillian Bean is a 76 y.o. female who presents for evaluation of Right Knee.   Possible infected   TKA.    Past Medical History:   Diagnosis Date   ??? Anemia    ??? Back pain 09/01/2007   ??? Cancer (HCC)     R breast   ??? Chronic pain     BACK PAIN   ??? Coagulation disorder ([place])     IRON DEFFICIENCY ANEMIA   ??? Colonic polyps 08/31/1998   ??? 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 Hiatal hernia 07/11/2011    Dr.sobieski   ??? Hypercholesteremia 09/01/2007   ??? Hypothyroidism 09/01/2003   ??? 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 Nausea & vomiting    ??? OA (osteoarthritis) 09/01/2007   ??? Other ill-defined conditions(799.89)     VERTIGO   ??? Plantar fasciitis 09/01/2007   ??? S/P colonoscopy 10/10/2007   ??? Thyroid disease      Past Surgical History:   Procedure Laterality Date   ??? BREAST SURGERY PROCEDURE UNLISTED  1993    Breast Reduction   ??? COLONOSCOPY N/A 11/12/2016    COLONOSCOPY performed by Burnett Harry, MD at [place]   ??? COLONOSCOPY,DIAGNOSTIC  11/12/2016        ??? ENDOSCOPY, COLON, DIAGNOSTIC  7/12/18/08    7/05 Dr Maudry Diego   ??? HX BREAST LUMPECTOMY Right 2016   ??? HX COLONOSCOPY  2014    Dr Illene Silver   ??? HX GI      COLONOSCOPY   ??? HX GYN      Hysterectomy in her 64's   ??? HX HEENT  04/07/10    thyroid biopsy neg dr Leward Quan   ??? HX HEENT      wisdom teeth extraction   ??? HX ORTHOPAEDIC  08/22/09    left TKR   ??? HX ORTHOPAEDIC  08/13/12    right tkr   ??? HX OTHER SURGICAL  2013    MINIMALLY INVASIVE LUMBAR DECOMPRESSION   ??? UPPER GI ENDOSCOPY,BIOPSY  11/12/2016           Family History   Problem Relation Age of Onset   ??? Cancer Mother      Breast   ??? Heart Failure Father      MI   ??? Seizures Brother      ms   ??? Anesth Problems Neg Hx      Social History   Substance Use Topics   ??? Smoking status: Never Smoker   ??? Smokeless tobacco: Never Used   ??? Alcohol use No       Prior to Admission medications    Medication Sig Start Date End Date Taking? Authorizing Provider   OTHER     Historical Provider   brimonidine-timolol (COMBIGAN) 0.2-0.5 % drop ophthalmic solution Administer 1 Drop to both eyes every twelve (12) hours.    Historical Provider   diclofenac EC (VOLTAREN) 50 mg EC tablet Take 1 Tab by mouth two (2) times daily as needed. 08/27/16   [name], MD   raNITIdine (ZANTAC) 150 mg tablet Take 1 Tab by mouth two (2) times a day. 08/27/16   [name], MD   alendronate (FOSAMAX) 70 mg tablet Take 1 Tab by mouth every seven (7) days. 07/25/16   Lowry Bowl  Wanda Plump, MD   colesevelam [facility]) 625 mg tablet 1 po bid 05/04/16   [name], MD   levothyroxine (SYNTHROID) 112 mcg tablet 8 per week 02/16/16   [name], MD   acetaminophen (TYLENOL) 500 mg tablet Take  by mouth every six (6) hours as needed for Pain.    Historical Provider   docusate sodium (COLACE) 100 mg capsule Take 100 mg by mouth daily.    Historical Provider   aspirin 81 mg chewable tablet Take 81 mg by mouth daily.    Historical Provider   multivitamin (ONE A DAY) tablet Take 1 Tab by mouth daily.    Historical Provider      Allergies   Allergen Reactions   ??? Atorvastatin Myalgia   ??? Zocor [Simvastatin] Other (comments) and Diarrhea     Other reaction(s): Adverse reaction to substance   Myalgias     ??? Celebrex [Celecoxib] Other (comments)     Aggitation   ??? Gabapentin Other (comments)     FELT UNSTEADY ON MY FEET   ??? Levaquin [Levofloxacin] Palpitations   ??? Nexium [Esomeprazole Magnesium] Diarrhea   ??? Other Medication Swelling     Latanoprost drops   ??? Oxycontin [Oxycodone] Itching   ??? Pravastatin Nausea and Vomiting   ??? Yellow Dye Hives       Review of Systems:  A comprehensive review of systems was negative except for that written in the History of Present Illness.    Objective:      No data found.      No data recorded.      Physical Exam:   LUNG: clear to auscultation bilaterally, HEART: regular rate and rhythm, ABDOMEN: soft, non-tender.  The right knee was marked for surgery.    Assessment:     Infected TKA.    Plan:     Removal versus revision TKA.    Signed By: Zollie Beckers, MD     November 26, 2016

## 2016-11-26 NOTE — Other (Signed)
VS stable. Awake and alert. Resting comfortably. Patient denies nausea or pain. No signs of excessive bleeding. Tolerating ice chips without difficulty. Ready to transfer from PACU.

## 2016-11-26 NOTE — Progress Notes (Signed)
Primary Nurse Bonner Puna, RN and Evelena Peat, RN performed a dual skin assessment on this patient No impairment noted  Braden score is 21

## 2016-11-26 NOTE — Anesthesia Pre-Procedure Evaluation (Addendum)
Anesthetic History   No history of anesthetic complications            Review of Systems / Medical History  Patient summary reviewed, nursing notes reviewed and pertinent labs reviewed    Pulmonary  Within defined limits                 Neuro/Psych   Within defined limits           Cardiovascular  Within defined limits                Exercise tolerance: >4 METS     GI/Hepatic/Renal  Within defined limits              Endo/Other      Hypothyroidism: well controlled  Arthritis, cancer and anemia     Other Findings   Comments: H/o r breast ca         Physical Exam    Airway  Mallampati: II  TM Distance: > 6 cm  Neck ROM: normal range of motion   Mouth opening: Normal     Cardiovascular  Regular rate and rhythm,  S1 and S2 normal,  no murmur, click, rub, or gallop             Dental  No notable dental hx       Pulmonary  Breath sounds clear to auscultation               Abdominal  GI exam deferred       Other Findings            Anesthetic Plan    ASA: 3  Anesthesia type: general and spinal          Induction: Intravenous  Anesthetic plan and risks discussed with: Patient

## 2016-11-27 LAB — HEMOGLOBIN: HGB: 7.1 g/dL — ABNORMAL LOW (ref 11.5–16.0)

## 2016-11-27 LAB — METABOLIC PANEL, BASIC
Anion gap: 6 mmol/L (ref 5–15)
BUN/Creatinine ratio: 10 — ABNORMAL LOW (ref 12–20)
BUN: 7 MG/DL (ref 6–20)
CO2: 26 mmol/L (ref 21–32)
Calcium: 7.8 MG/DL — ABNORMAL LOW (ref 8.5–10.1)
Chloride: 107 mmol/L (ref 97–108)
Creatinine: 0.72 MG/DL (ref 0.55–1.02)
GFR est AA: >60 mL/min/{1.73_m2} (ref >60)
GFR est non-AA: >60 mL/min/{1.73_m2} (ref >60)
Glucose: 126 mg/dL — ABNORMAL HIGH (ref 65–100)
Potassium: 3.3 mmol/L — ABNORMAL LOW (ref 3.5–5.1)
Sodium: 139 mmol/L (ref 136–145)

## 2016-11-27 MED ORDER — PHARMACY VANCOMYCIN NOTE
Status: DC
Start: 2016-11-27 — End: 2016-11-29

## 2016-11-27 MED ORDER — SODIUM CHLORIDE 0.9 % IJ SYRG
INTRAMUSCULAR | Status: DC | PRN
Start: 2016-11-27 — End: 2016-12-04

## 2016-11-27 MED ORDER — SENNOSIDES-DOCUSATE SODIUM 8.6 MG-50 MG TAB
Freq: Two times a day (BID) | ORAL | Status: DC
Start: 2016-11-27 — End: 2016-12-04
  Administered 2016-11-27 – 2016-12-01 (×9): via ORAL

## 2016-11-27 MED ORDER — ACETAMINOPHEN 325 MG TABLET
325 mg | Freq: Once | ORAL | Status: AC
Start: 2016-11-27 — End: 2016-11-28

## 2016-11-27 MED ORDER — SODIUM CHLORIDE 0.9 % IV PIGGY BACK
2 gram | INTRAVENOUS | Status: DC
Start: 2016-11-27 — End: 2016-11-29
  Administered 2016-11-27 – 2016-11-29 (×3): via INTRAVENOUS

## 2016-11-27 MED ORDER — RANITIDINE 150 MG TAB
150 mg | Freq: Two times a day (BID) | ORAL | Status: DC
Start: 2016-11-27 — End: 2016-11-26

## 2016-11-27 MED ORDER — NALOXONE 0.4 MG/ML INJECTION
0.4 mg/mL | INTRAMUSCULAR | Status: DC | PRN
Start: 2016-11-27 — End: 2016-12-04

## 2016-11-27 MED ORDER — SODIUM CHLORIDE 0.9 % IJ SYRG
Freq: Three times a day (TID) | INTRAMUSCULAR | Status: DC
Start: 2016-11-27 — End: 2016-12-04
  Administered 2016-11-27 – 2016-12-04 (×22): via INTRAVENOUS

## 2016-11-27 MED ORDER — CEFAZOLIN 2 GRAM/20 ML IN STERILE WATER INTRAVENOUS SYRINGE
2 gram/0 mL | Freq: Three times a day (TID) | INTRAVENOUS | Status: DC
Start: 2016-11-27 — End: 2016-11-27
  Administered 2016-11-27 (×2): via INTRAVENOUS

## 2016-11-27 MED ORDER — SODIUM CHLORIDE 0.9 % IV
INTRAVENOUS | Status: DC | PRN
Start: 2016-11-27 — End: 2016-12-04

## 2016-11-27 MED ORDER — POLYETHYLENE GLYCOL 3350 17 GRAM (100 %) ORAL POWDER PACKET
17 gram | Freq: Every day | ORAL | Status: DC
Start: 2016-11-27 — End: 2016-12-04
  Administered 2016-11-27 – 2016-11-29 (×3): via ORAL

## 2016-11-27 MED ORDER — DIPHENHYDRAMINE 12.5 MG/5 ML ELIXIR
12.5 mg/5 mL | Freq: Four times a day (QID) | ORAL | Status: AC | PRN
Start: 2016-11-27 — End: 2016-11-27

## 2016-11-27 MED ORDER — HYDROMORPHONE 2 MG TAB
2 mg | ORAL | Status: DC | PRN
Start: 2016-11-27 — End: 2016-12-04

## 2016-11-27 MED ORDER — VANCOMYCIN IN 0.9 % SODIUM CHLORIDE 1.25 GRAM/250 ML IV
1.25 gram/250 mL | INTRAVENOUS | Status: DC
Start: 2016-11-27 — End: 2016-11-29
  Administered 2016-11-28 – 2016-11-29 (×3): via INTRAVENOUS

## 2016-11-27 MED ORDER — ACETAMINOPHEN 500 MG TAB
500 mg | Freq: Four times a day (QID) | ORAL | Status: DC
Start: 2016-11-27 — End: 2016-12-04
  Administered 2016-11-27 – 2016-12-04 (×26): via ORAL

## 2016-11-27 MED ORDER — SODIUM CHLORIDE 0.9% BOLUS IV
0.9 % | Freq: Once | INTRAVENOUS | Status: AC
Start: 2016-11-27 — End: 2016-11-27
  Administered 2016-11-27: 03:00:00 via INTRAVENOUS

## 2016-11-27 MED ORDER — FAMOTIDINE 20 MG TAB
20 mg | Freq: Two times a day (BID) | ORAL | Status: DC
Start: 2016-11-27 — End: 2016-12-04
  Administered 2016-11-27 – 2016-12-04 (×15): via ORAL

## 2016-11-27 MED ORDER — TRAMADOL 50 MG TAB
50 mg | Freq: Four times a day (QID) | ORAL | Status: DC | PRN
Start: 2016-11-27 — End: 2016-12-04
  Administered 2016-11-28: 14:00:00 via ORAL

## 2016-11-27 MED ORDER — BRIMONIDINE 0.2 % EYE DROPS
0.2 % | Freq: Two times a day (BID) | OPHTHALMIC | Status: DC
Start: 2016-11-27 — End: 2016-12-04
  Administered 2016-11-27 – 2016-12-04 (×16): via OPHTHALMIC

## 2016-11-27 MED ORDER — VANCOMYCIN IN 0.9 % SODIUM CHLORIDE 1.75 GRAM/500 ML IV
1.75 gram/500 mL | Freq: Once | INTRAVENOUS | Status: AC
Start: 2016-11-27 — End: 2016-11-27
  Administered 2016-11-27: 19:00:00 via INTRAVENOUS

## 2016-11-27 MED ORDER — SODIUM CHLORIDE 0.9 % INJECTION
INTRAMUSCULAR | Status: AC
Start: 2016-11-27 — End: 2016-11-26
  Administered 2016-11-27: 02:00:00

## 2016-11-27 MED ORDER — KETOROLAC TROMETHAMINE 30 MG/ML INJECTION
30 mg/mL (1 mL) | Freq: Four times a day (QID) | INTRAMUSCULAR | Status: AC
Start: 2016-11-27 — End: 2016-11-27
  Administered 2016-11-27 (×4): via INTRAVENOUS

## 2016-11-27 MED ORDER — DOCUSATE SODIUM 100 MG CAP
100 mg | Freq: Every day | ORAL | Status: DC
Start: 2016-11-27 — End: 2016-12-04
  Administered 2016-11-27 – 2016-12-01 (×4): via ORAL

## 2016-11-27 MED ORDER — MORPHINE 4 MG/ML SYRINGE
4 mg/mL | INTRAMUSCULAR | Status: DC | PRN
Start: 2016-11-27 — End: 2016-12-04
  Administered 2016-11-27 (×2): via INTRAVENOUS

## 2016-11-27 MED ORDER — TIMOLOL MALEATE 0.5 % EYE DROPS
0.5 % | Freq: Two times a day (BID) | OPHTHALMIC | Status: DC
Start: 2016-11-27 — End: 2016-12-04
  Administered 2016-11-27 – 2016-12-04 (×16): via OPHTHALMIC

## 2016-11-27 MED ORDER — ASPIRIN 325 MG TAB, DELAYED RELEASE
325 mg | Freq: Two times a day (BID) | ORAL | Status: DC
Start: 2016-11-27 — End: 2016-12-04
  Administered 2016-11-27 – 2016-12-04 (×14): via ORAL

## 2016-11-27 MED ORDER — ONDANSETRON (PF) 4 MG/2 ML INJECTION
4 mg/2 mL | INTRAMUSCULAR | Status: AC | PRN
Start: 2016-11-27 — End: 2016-11-27

## 2016-11-27 MED ORDER — LEVOTHYROXINE 112 MCG TAB
112 mcg | ORAL | Status: DC
Start: 2016-11-27 — End: 2016-12-04
  Administered 2016-11-27 – 2016-12-04 (×8): via ORAL

## 2016-11-27 MED ORDER — PHARMACY VANCOMYCIN NOTE
Status: DC | PRN
Start: 2016-11-27 — End: 2016-11-27

## 2016-11-27 MED ORDER — BISACODYL 10 MG RECTAL SUPPOSITORY
10 mg | Freq: Every day | RECTAL | Status: DC | PRN
Start: 2016-11-27 — End: 2016-12-04

## 2016-11-27 MED FILL — NORMAL SALINE FLUSH 0.9 % INJECTION SYRINGE: INTRAMUSCULAR | Qty: 10

## 2016-11-27 MED FILL — SODIUM CHLORIDE 0.9 % IV: INTRAVENOUS | Qty: 250

## 2016-11-27 MED FILL — SYNTHROID 112 MCG TABLET: 112 mcg | ORAL | Qty: 1

## 2016-11-27 MED FILL — SODIUM CHLORIDE 0.9 % IV: INTRAVENOUS | Qty: 500

## 2016-11-27 MED FILL — PHENYLEPHRINE 10 MG/ML INJECTION: 10 mg/mL | INTRAMUSCULAR | Qty: 1

## 2016-11-27 MED FILL — PHARMACY VANCOMYCIN NOTE: Qty: 1

## 2016-11-27 MED FILL — MORPHINE 4 MG/ML SYRINGE: 4 mg/mL | INTRAMUSCULAR | Qty: 1

## 2016-11-27 MED FILL — DOK PLUS 8.6 MG-50 MG TABLET: ORAL | Qty: 1

## 2016-11-27 MED FILL — ASPIRIN 325 MG TAB, DELAYED RELEASE: 325 mg | ORAL | Qty: 1

## 2016-11-27 MED FILL — FAMOTIDINE 20 MG TAB: 20 mg | ORAL | Qty: 1

## 2016-11-27 MED FILL — DOK 100 MG CAPSULE: 100 mg | ORAL | Qty: 1

## 2016-11-27 MED FILL — ONDANSETRON (PF) 4 MG/2 ML INJECTION: 4 mg/2 mL | INTRAMUSCULAR | Qty: 2

## 2016-11-27 MED FILL — ACETAMINOPHEN 500 MG TAB: 500 mg | ORAL | Qty: 2

## 2016-11-27 MED FILL — SODIUM CHLORIDE 0.9 % IV: INTRAVENOUS | Qty: 1000

## 2016-11-27 MED FILL — KETOROLAC TROMETHAMINE 30 MG/ML INJECTION: 30 mg/mL (1 mL) | INTRAMUSCULAR | Qty: 1

## 2016-11-27 MED FILL — POLYETHYLENE GLYCOL 3350 17 GRAM (100 %) ORAL POWDER PACKET: 17 gram | ORAL | Qty: 1

## 2016-11-27 MED FILL — CEFAZOLIN 2 GRAM/20 ML IN STERILE WATER INTRAVENOUS SYRINGE: 2 gram/0 mL | INTRAVENOUS | Qty: 20

## 2016-11-27 MED FILL — LACTATED RINGERS IV: INTRAVENOUS | Qty: 2000

## 2016-11-27 MED FILL — TIMOLOL MALEATE 0.5 % EYE DROPS: 0.5 % | OPHTHALMIC | Qty: 5

## 2016-11-27 MED FILL — SENSORCAINE-MPF 0.5 % (5 MG/ML) INJECTION SOLUTION: 0.5 % (5 mg/mL) | INTRAMUSCULAR | Qty: 3

## 2016-11-27 MED FILL — PROPOFOL 10 MG/ML IV EMUL: 10 mg/mL | INTRAVENOUS | Qty: 548.3

## 2016-11-27 MED FILL — VANCOMYCIN IN 0.9 % SODIUM CHLORIDE 1.75 GRAM/500 ML IV: 1.75 gram/500 mL | INTRAVENOUS | Qty: 500

## 2016-11-27 MED FILL — BRIMONIDINE 0.2 % EYE DROPS: 0.2 % | OPHTHALMIC | Qty: 5

## 2016-11-27 MED FILL — CEFTRIAXONE 2 GRAM SOLUTION FOR INJECTION: 2 gram | INTRAMUSCULAR | Qty: 2

## 2016-11-27 MED FILL — SODIUM CHLORIDE 0.9 % INJECTION: INTRAMUSCULAR | Qty: 10

## 2016-11-27 NOTE — Consults (Signed)
[place]    Name:Jillian Bean, Jillian Bean  MR#: [id number]  DOB: 1941/01/27  ACCOUNT #: [id number]   DATE OF SERVICE: 11/27/2016    REQUESTING PHYSICIAN:  Dr. Elvin So.    REASON FOR CONSULTATION:  Right knee prosthetic infection.      HISTORY OF PRESENT ILLNESS:  The patient is a 76 year old woman whose medical history is significant for right breast cancer and right knee arthritis, which required a right knee replacement a few years ago.  Since December of last year, she has been having some soreness on her right lower extremity.  About 3 weeks back, the right knee became swollen and red.  There was no history of injury.  It became to the point that it was painful to bend and it was painful to walk.  She did not have any fevers or any chills.  She went to Dr. Elvin So who advised her to undergo a knee arthroplasty.  This was done yesterday.  The surgical cultures are now growing coag negative staph.  She does not have any fevers or any chills.    ALLERGIES:  ATORVASTATIN, ZOCOR, CELEBREX, GABAPENTIN, LEVAQUIN, NEXIUM, OXYCONTIN, PRAVASTATIN.    CURRENT MEDICATIONS  1.  Tylenol.  2.  Aspirin.    3.  Alphagan.  4.  Colace.    5.  Cefazolin.  6.  Pepcid.  7.  Toradol.  8.  Synthroid.  9.  MiraLax.    10.  Peri-Colace.    REVIEW OF SYSTEMS:  She does not have any shortness of breath.  Vision and hearing are stable.  She does have a slight cough.  No dyspnea.  She does not have any chest pain.  She is not having nausea or vomiting.  She does not have any abdominal pain.  She does not have any hematochezia.  She does not have any other joint pain, no rash, no hallucinations.  No gum bleeding.  No depression.  No dysuria, no urinary frequency, no urinary urgency.  Aside from the things mentioned previously, the rest of the review of system is negative.    PAST MEDICAL HISTORY  1.  Right breast cancer.  2.  Chronic back pain.  3.  Iron deficiency anemia.  4.  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  5.  Hiatal hernia.  6.  Hyperlipidemia.  7.  Hypothyroidism.  8.  History of lymphocytic colitis.    9.  History of vertigo.  10.  History of plantar fasciitis.    PAST SURGICAL HISTORY  1.  Breast reduction.    2.  Lumpectomy.    3.  Multiple colonoscopies.  4.  Upper endoscopy.  5.  Hysterectomy.  6.  Thyroid biopsy.  7.  Wisdom teeth extraction.  8.  Bilateral total knee replacement.  9.  Minimally invasive lumbar decompression.    FAMILY HISTORY:  Her mother had breast cancer.  Her father had myocardial infarction.  Her brother has multiple sclerosis.    SOCIAL HISTORY:  She does not smoke tobacco, drink alcohol, or engage in recreational drug use.    PHYSICAL EXAMINATION  GENERAL:  She is not in respiratory distress.  VITAL SIGNS:  Temperature is 97.9, pulse 69, blood pressure 92/55, satting at 96% on room air.  HEENT:  Pink conjunctivae, anicteric sclerae.  No JVD.  No cervical lymphadenopathy.  External ears are normal.  LUNGS:  Clear to auscultation.  No rales, wheeze or rhonchi.  HEART:  No murmurs, rubs or clicks.  ABDOMEN:  Soft, nontender.  No guarding or rebound.  GENITOURINARY:  No flank tenderness.  MUSCULOSKELETAL:  The right lower extremity is heavily bandaged.    INTEGUMENT:  Did not notice any rash.  PSYCHIATRIC:  No disturbance in thought.  Mood and affect are appropriate.  NEUROLOGIC:  She has full use of her extraocular muscles, tongue midline.  No facial asymmetry.  Muscle strength is 5/5.    LABORATORY DATA:  Microbiology:  Surgical cultures are growing Staph species.  Urine culture from 04/11 is growing more than 100,000 E. coli.  Hemoglobin is 7.1, WBC 5.3, platelets 243, creatinine 0.72.    IMPRESSION   1.  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  2.  Iron deficiency anemia.  3.  Hypothyroidism.  4.  Bacteriuria    PLAN  1.  She will need 6 weeks of IV antibiotics.  2.  We will start ceftriaxone and vancomycin.  We will  discontinue cefazolin.  We will follow up cultures.  3.  Tomorrow, she should get an ESR, CRP, CBC and CMP.    Thank you for the consult.      Janyth Pupa, MD       AG / GN  D: 11/27/2016 12:27     T: 11/27/2016 13:01  JOB #: [id number]

## 2016-11-27 NOTE — Progress Notes (Signed)
Complaints: none  Events: Infectious disease consulted. Will follow their recommendations for antibiotics. Cultures pending.   Low Hgb, will transfuse today.      GEN:  NAD. AOx3   ABD:  S/NT/ND   RLE:  Dressing C/D/I    5/5 motor    Calf nttp (Bilat)    Sensate all distribution to light touch    1+ dp/pt pulses, foot perfused      Lab Results   Component Value Date/Time    HGB 7.1 (L) 11/27/2016 03:31 AM    INR 1.0 11/21/2016 01:34 PM       Lab Results   Component Value Date/Time    Sodium 139 11/27/2016 03:31 AM    Potassium 3.3 (L) 11/27/2016 03:31 AM    Chloride 107 11/27/2016 03:31 AM    CO2 26 11/27/2016 03:31 AM    BUN 7 11/27/2016 03:31 AM    Creatinine 0.72 11/27/2016 03:31 AM    Calcium 7.8 (L) 11/27/2016 03:31 AM            POD #1 RIGHT TOTAL KNEE REPLACEMENT. Satisfactory progress. Patient is doing well. She should remain nonweightbearing and in knee immobilizer. She will receive blood today given her low Hgb. Pain is well-controlled on current medications. She was able to ambulate with PT today. Discussed with the patient and her husband the probable course of her hospital stay and the accommodations she will need upon discharge, all their questions were answered.    ABX: Continue per Infectious Disease recommendations. Cultures pending.  PATHWAY: D/C Foley per protocol  DVT Prophylaxis: ASA  Weight Bearing: WBAT RLE   Pain Control: PRN oral narcotics, celebrex  Anticipated Discharge Date: TBD - Possibly Friday  Disposition: Home, HHPT vs. SNF

## 2016-11-27 NOTE — Progress Notes (Signed)
Bedside shift change report given to Jillian Bean, Therapist, sports (Soil scientist) by Lattie Haw, RN (offgoing nurse). Report included the following information SBAR.

## 2016-11-27 NOTE — Progress Notes (Addendum)
Problem: Mobility Impaired (Adult and Pediatric)  Goal: *Acute Goals and Plan of Care (Insert Text)  Physical Therapy Goals  Initiated 11/27/2016  1.  Patient will move from supine to sit and sit to supine  in bed with modified independence within 7 day(s).    2.  Patient will transfer from bed to chair and chair to bed with modified independence using the least restrictive device within 7 day(s).  3.  Patient will perform sit to stand with modified independence within 7 day(s).  4.  Patient will ambulate with modified independence for 100 feet with the least restrictive device within 7 day(s).   5.  Patient will ascend/descend 1 stairs with 0 handrail(s) with modified independence within 7 day(s).  physical Therapy EVALUATION  Patient: Jillian Bean (76 y.o. female)  Date: 11/27/2016  Primary Diagnosis: FAILED TOTAL RIGHT KNEE REPLACEMENT  Failed total knee, right (HCC)  Procedure(s) (LRB):   REMOVAL BOTH COMPONENTS RIGHT KNEE, REMOVAL INFECTED RIGHT KNEE, REVISION RIGHT TOTAL KNEE (Right) 1 Day Post-Op   Precautions:   Fall, WBAT    ASSESSMENT :  Based on the objective data described below, the patient presents with decreased mobility after removal of infected R knee prosthesis and revision POD1.  She currently has orders to be NWB and remain in knee immobilizer, which considerably limits her overall mobility.  She is able to ambulate with a walker for short distances NWB on the R, but she is limited by fatigue in her upper body.  Reviewed gait training and exercises to strengthen upper body to foster stability in gait.  Also instructed in use of the knee immobilizer and how to secure and adjust it.    She does have assist at home from her husband and will only need to manage 1 step into the home.  Will discuss weight bearing status with MD.  If she is able to weight bear, she will not have any difficulty with discharge home with only a few more sessions of PT, but if she remains NWB with the  knee immobilizer, she will need quite a bit more practice and strengthening to be able to safely transition home with HHPT and may need a wheelchair for safety.    Patient will benefit from skilled intervention to address the above impairments.  Patient???s rehabilitation potential is considered to be Good  Factors which may influence rehabilitation potential include:   []          None noted  []          Mental ability/status  [x]          Medical condition  [x]          Home/family situation and support systems  [x]          Safety awareness  [x]          Pain tolerance/management  []          Other:      PLAN :  Recommendations and Planned Interventions:  [x]            Bed Mobility Training             []     Neuromuscular Re-Education  [x]            Transfer Training                   []     Orthotic/Prosthetic Training  [x]            Gait Training                         []   Modalities  [x]            Therapeutic Exercises           []     Edema Management/Control  [x]            Therapeutic Activities            [x]     Patient and Family Training/Education  []            Other (comment):    Frequency/Duration: Patient will be followed by physical therapy daily to address goals.  Discharge Recommendations: Home Health  Further Equipment Recommendations for Discharge: to be determined     SUBJECTIVE:   Patient stated ???How stiff is it going to be with this brace on?.???    OBJECTIVE DATA SUMMARY:   HISTORY:    Past Medical History:   Diagnosis Date   ??? Anemia    ??? Back pain 09/01/2007   ??? Cancer (HCC)     R breast   ??? Chronic pain     BACK PAIN   ??? Coagulation disorder ([place])     IRON DEFFICIENCY ANEMIA   ??? Colonic polyps 08/31/1998   ??? 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 Hiatal hernia 07/11/2011    Dr.sobieski   ??? Hypercholesteremia 09/01/2007   ??? Hypothyroidism 09/01/2003   ??? 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 Nausea & vomiting    ??? OA (osteoarthritis) 09/01/2007   ??? Other ill-defined conditions(799.89)     VERTIGO   ??? Plantar fasciitis 09/01/2007   ??? S/P colonoscopy 10/10/2007   ??? Thyroid disease      Past Surgical History:   Procedure Laterality Date   ??? BREAST SURGERY PROCEDURE UNLISTED  1993    Breast Reduction   ??? COLONOSCOPY N/A 11/12/2016    COLONOSCOPY performed by Burnett Harry, MD at [place]   ??? COLONOSCOPY,DIAGNOSTIC  11/12/2016        ??? ENDOSCOPY, COLON, DIAGNOSTIC  7/12/18/08    7/05 Dr Maudry Diego   ??? HX BREAST LUMPECTOMY Right 2016   ??? HX COLONOSCOPY  2014    Dr Illene Silver   ??? HX GI      COLONOSCOPY   ??? HX GYN      Hysterectomy in her 20's   ??? HX HEENT  04/07/10    thyroid biopsy neg dr Leward Quan   ??? HX HEENT      wisdom teeth extraction   ??? HX ORTHOPAEDIC  08/22/09    left TKR   ??? HX ORTHOPAEDIC  08/13/12    right tkr   ??? HX OTHER SURGICAL  2013    MINIMALLY INVASIVE LUMBAR DECOMPRESSION   ??? UPPER GI ENDOSCOPY,BIOPSY  11/12/2016          Prior Level of Function/Home Situation: independent, husband at home, 1 level home  Personal factors and/or comorbidities impacting plan of care: RLE knee immobilizer and NWB RLE    Home Situation  Home Environment: Other (comment)  # Steps to Enter:  (1)  One/Two Story Residence: Two Stage manager Available: No  Living Alone: No  Support Systems: Copy  Patient Expects to be Discharged to:: Private residence  Current DME Used/Available at Home: Kasandra Knudsen, straight, Environmental consultant (see previous documentation)    EXAMINATION/PRESENTATION/DECISION MAKING:   Critical Behavior:  Neurologic State: Alert, Appropriate for age  Hearing:  Auditory  Auditory Impairment: None  Skin:  Post op dressing in place with scant serousanguinous incisional drainage  Edema: minimal RLE edema  Range Of Motion:  AROM: Within functional limits (R knee in immobilizer)                       Strength:    Strength: Within functional limits (RLE limited by pain and immobilizer)                     Tone & Sensation:   Tone: Normal              Sensation: Intact               Coordination:  Coordination: Within functional limits  Vision:      Functional Mobility:  Bed Mobility:     Supine to Sit: Minimum assistance;Additional time        Transfers:  Sit to Stand: Minimum assistance;Adaptive equipment;Additional time  Stand to Sit: Contact guard assistance;Adaptive equipment;Additional time        Bed to Chair: Minimum assistance;Adaptive equipment;Additional time              Balance:   Sitting: Intact;Without support  Standing: Impaired;With support  Standing - Static: Good  Standing - Dynamic : Fair  Ambulation/Gait Training:  Distance (ft): 25 Feet (ft)  Assistive Device: Gait belt;Walker, rolling  Ambulation - Level of Assistance: Minimal assistance;Adaptive equipment;Additional time        Gait Abnormalities: Decreased step clearance;Step to gait  Right Side Weight Bearing: Non-weight bearing  Left Side Weight Bearing: Full  Base of Support: Shift to left     Speed/Cadence: Pace decreased (<100 feet/min)  Step Length: Left shortened        Interventions: Safety awareness training;Tactile cues;Verbal cues;Visual/Demos            Stairs:              Therapeutic Exercises:   Chair push ups    Functional Measure:  Barthel Index:    Bathing: 0  Bladder: 10  Bowels: 10  Grooming: 5  Dressing: 5  Feeding: 10  Mobility: 0  Stairs: 0  Toilet Use: 5  Transfer (Bed to Chair and Back): 10  Total: 55       Barthel and G-code impairment scale:  Percentage of impairment CH  0% CI  1-19% CJ  20-39% CK  40-59% CL  60-79% CM  80-99% CN  100%   Barthel Score 0-100 100 99-80 79-60 59-40 20-39 1-19   0   Barthel Score 0-20 20 17-19 13-16 9-12 5-8 1-4 0      The Barthel ADL Index: Guidelines  1. The index should be used as a record of what a patient does, not as a record of what a patient could do.  2. The main aim is to establish degree of independence from any help,  physical or verbal, however minor and for whatever reason.  3. The need for supervision renders the patient not independent.  4. A patient's performance should be established using the best available evidence. Asking the patient, friends/relatives and nurses are the usual sources, but direct observation and common sense are also important. However direct testing is not needed.  5. Usually the patient's performance over the preceding 24-48 hours is important, but occasionally longer periods will be relevant.  6. Middle categories imply that the patient supplies over 50 per  cent of the effort.  7. Use of aids to be independent is allowed.    Daneen Schick., Barthel, D.W. [phone]). Functional evaluation: the Barthel Index. [place] (14)2.  Lucianne Lei der [place], J.J.M.F, [place], Diona Browner., Oris Drone., [place], [place] (1999). Measuring the change indisability after inpatient rehabilitation; comparison of the responsiveness of the Barthel Index and Functional Independence Measure. Journal of Neurology, Neurosurgery, and Psychiatry, 66(4), [phone].  Wilford Sports, N.J.A, [place],  W.J.M, & [place], M.A. (2004.) Assessment of post-stroke quality of life in cost-effectiveness studies: The usefulness of the Barthel Index and the EuroQoL-5D. Quality of Life Research, 13, 427-43       G codes:  In compliance with CMS???s Claims Based Outcome Reporting, the following G-code set was chosen for this patient based on their primary functional limitation being treated:    The outcome measure chosen to determine the severity of the functional limitation was the barthel with a score of 55/100 which was correlated with the impairment scale.    ? Mobility - Walking and Moving Around:    [phone] - CURRENT STATUS: CK - 40%-59% impaired, limited or restricted   G8979 - GOAL STATUS: CI - 1%-19% impaired, limited or restricted   M3536 - D/C STATUS:  ---------------To be determined---------------       Physical Therapy Evaluation Charge Determination   History Examination Presentation Decision-Making   MEDIUM  Complexity : 1-2 comorbidities / personal factors will impact the outcome/ POC  MEDIUM Complexity : 3 Standardized tests and measures addressing body structure, function, activity limitation and / or participation in recreation  LOW Complexity : Stable, uncomplicated  LOW Complexity : FOTO score of 75-100      Based on the above components, the patient evaluation is determined to be of the following complexity level: LOW     Pain:  Pain Scale 1: Numeric (0 - 10)  Pain Intensity 1: 1  Pain Location 1: Incisional  Pain Orientation 1: Inner;Right  Pain Description 1: Aching  Pain Intervention(s) 1: Medication (see MAR);Elevation  Activity Tolerance:   Good  Please refer to the flowsheet for vital signs taken during this treatment.  After treatment:   [x]          Patient left in no apparent distress sitting up in chair, RLE elevated on stool  []          Patient left in no apparent distress in bed  [x]          Call bell left within reach  [x]          Nursing notified  [x]          Caregiver present  []          Bed alarm activated    COMMUNICATION/EDUCATION:   The patient???s plan of care was discussed with: Registered Nurse and Education officer, museum.  [x]          Fall prevention education was provided and the patient/caregiver indicated understanding.  [x]          Patient/family have participated as able in goal setting and plan of care.  [x]          Patient/family agree to work toward stated goals and plan of care.  []          Patient understands intent and goals of therapy, but is neutral about his/her participation.  []          Patient is unable to participate in goal setting and plan of care.    Thank you for  this referral.  Gaynelle Arabian, PT   Time Calculation: 49 mins

## 2016-11-27 NOTE — Progress Notes (Signed)
Problem: Discharge Planning  Goal: *Discharge to safe environment  Outcome: Progressing Towards Goal  Home health and IV infusion

## 2016-11-27 NOTE — Progress Notes (Signed)
Pharmacist Note - Vancomycin Dosing    Consult provided for this 76 y.o. female for indication of septic joint infection  Antibiotic regimen(s): Vancomycin + Ceftriaxone    Recent Labs      11/27/16   0331   CREA  0.72   BUN  7     Frequency of BMP: Daily  Height: 165.1 cm  Weight: 63.5 kg  Est CrCl: 58.9  ml/min; UO (followed)  Temp (24hrs), Avg:98 ??F (36.7 ??C), Min:97.2 ??F (36.2 ??C), Max:98.7 ??F (37.1 ??C)    Cultures:  11/26/2016 Wound cx (right tibial canal), scant staph species, preliminary  11/27/2016 NGTD (16 hours)      Goal trough = 15 - 20 mcg/mL    Therapy will be initiated with a loading dose of 1750 mg IV x 1 to be followed by a maintenance dose of 1250 mg IV every 16 hours.  Pharmacy to follow patient daily and order levels / make dose adjustments as appropriate.

## 2016-11-27 NOTE — Consults (Addendum)
[place]    Name:Jillian Bean, Jillian Bean  MR#: [id number]  DOB: [birth year]  ACCOUNT #: [id number]   DATE OF SERVICE: 11/27/2016    REQUESTING PHYSICIAN:  Dr. Elvin So.    REASON FOR CONSULTATION:  Right knee prosthetic infection.      HISTORY OF PRESENT ILLNESS:  The patient is a 76 year old woman whose medical history is significant for right breast cancer and right knee arthritis, which required a right knee replacement a few years ago.  Since December of last year, she has been having some soreness on her right lower extremity.  About 3 weeks back, the right knee became swollen and red.  There was no history of injury.  It became to the point that it was painful to bend and it was painful to walk.  She did not have any fevers or any chills.  She went to Dr. Elvin So who advised her to undergo a knee arthroplasty.  This was done yesterday.  The surgical cultures are now growing coag negative staph.  She does not have any fevers or any chills.    ALLERGIES:  ATORVASTATIN, ZOCOR, CELEBREX, GABAPENTIN, LEVAQUIN, NEXIUM, OXYCONTIN, PRAVASTATIN.    CURRENT MEDICATIONS  1.  Tylenol.  2.  Aspirin.    3.  Alphagan.  4.  Colace.    5.  Cefazolin.  6.  Pepcid.  7.  Toradol.  8.  Synthroid.  9.  MiraLax.    10.  Peri-Colace.    REVIEW OF SYSTEMS:  She does not have any shortness of breath.  Vision and hearing are stable.  She does have a slight cough.  No dyspnea.  She does not have any chest pain.  She is not having nausea or vomiting.  She does not have any abdominal pain.  She does not have any hematochezia.  She does not have any other joint pain, no rash, no hallucinations.  No gum bleeding.  No depression.  No dysuria, no urinary frequency, no urinary urgency.  Aside from the things mentioned previously, the rest of the review of system is negative.    PAST MEDICAL HISTORY  1.  Right breast cancer.  2.  Chronic back pain.  3.  Iron deficiency anemia.   4.  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  5.  Hiatal hernia.  6.  Hyperlipidemia.  7.  Hypothyroidism.  8.  History of lymphocytic colitis.    9.  History of vertigo.  10.  History of plantar fasciitis.    PAST SURGICAL HISTORY  1.  Breast reduction.    2.  Lumpectomy.    3.  Multiple colonoscopies.  4.  Upper endoscopy.  5.  Hysterectomy.  6.  Thyroid biopsy.  7.  Wisdom teeth extraction.  8.  Bilateral total knee replacement.  9.  Minimally invasive lumbar decompression.    FAMILY HISTORY:  Her mother had breast cancer.  Her father had myocardial infarction.  Her brother has multiple sclerosis.    SOCIAL HISTORY:  She does not smoke tobacco, drink alcohol, or engage in recreational drug use.    PHYSICAL EXAMINATION  GENERAL:  She is not in respiratory distress.  VITAL SIGNS:  Temperature is 97.9, pulse 69, blood pressure 92/55, satting at 96% on room air.  HEENT:  Pink conjunctivae, anicteric sclerae.  No JVD.  No cervical lymphadenopathy.  External ears are normal.  LUNGS:  Clear to auscultation.  No rales, wheeze or rhonchi.  HEART:  No murmurs, rubs or clicks.  ABDOMEN:  Soft, nontender.  No guarding or rebound.  GENITOURINARY:  No flank tenderness.  MUSCULOSKELETAL:  The right lower extremity is heavily bandaged.    INTEGUMENT:  Did not notice any rash.  PSYCHIATRIC:  No disturbance in thought.  Mood and affect are appropriate.  NEUROLOGIC:  She has full use of her extraocular muscles, tongue midline.  No facial asymmetry.  Muscle strength is 5/5.    LABORATORY DATA:  Microbiology:  Surgical cultures are growing Staph species.  Urine culture from 04/11 is growing more than 100,000 E. coli.  Hemoglobin is 7.1, WBC 5.3, platelets 243, creatinine 0.72.    IMPRESSION   1.  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  2.  Iron deficiency anemia.  3.  Hypothyroidism.  4.  Bacteriuria    PLAN  1.  She will need 6 weeks of IV antibiotics.   2.  We will start ceftriaxone and vancomycin.  We will discontinue cefazolin.  We will follow up cultures.  3.  Tomorrow, she should get an ESR, CRP, CBC and CMP.    Thank you for the consult.      Janyth Pupa, MD       AG / GN  D: 11/27/2016 12:27     T: 11/27/2016 13:01  JOB #: [id number]

## 2016-11-27 NOTE — Op Note (Signed)
[place] REPORT    Name:Jillian Bean, Jillian Bean  MR#: [id number]  DOB: Sep 15, 1940  ACCOUNT #: [id number]   DATE OF SERVICE: 11/26/2016    PREOPERATIVE DIAGNOSIS:  Infected total knee arthroplasty, right knee.     POSTOPERATIVE DIAGNOSIS:  Infected total knee arthroplasty, right knee.      PROCEDURE PERFORMED:  Removal of infected total knee, placement of antibiotic spacer.      SURGEON:  Zollie Beckers, MD     ASSISTANT:  Georges Mouse     ANESTHESIA:  Spinal.    ESTIMATED BLOOD LOSS:  Minimal.    COMPLICATIONS:  None.    SPECIMENS REMOVED:  Multiple sets of cultures were sent.     IMPLANTS:  10 depuy poly.  Small femoral component.     INDICATIONS:  A 76 year old woman with continued pain after right total knee.  Her x-ray showed loose tibial component with significant proximal tibial osteolysis.  Aspiration was significant for elevated white blood cell count with predominance of polys.  Working diagnosis at that point was infected right total knee.    PROCEDURE:  Anesthetic was initiated.  Right lower extremity was then prepped and draped in the usual sterile fashion.  Preoperative dose of antibiotic was given.  Limb was exsanguinated, tourniquet was inflated.  Used her old incision and a medial arthrotomy was made, continuing into a mid vastus approach to the knee.  There was gross purulence coming at the bone-prosthetic interface.  At this point, the knee was exposed.  Soft tissues were removed.  Complete synovectomy was performed.  Medial and lateral gutters were reconstituted.  The polyethylene was removed.  Femur was exposed.  Osteotomes and saw were used to remove femoral component with minimal bone loss.  All lytic material was removed.  All cement was removed.  Copiously irrigated the femur and then entered the femoral medullary canal, copiously irrigated the canal.      Turned attention towards the tibia.  Tibia was grossly loose, it was  removed, all cement was removed.  There was perforation of the tibia anteriorly from the chronic infection, with pus coming out of the anterior tibia.  All devitalized tissue was curetted.  Posterior synovectomy was performed.  Knee joint was copiously irrigated.  The patella component was removed with a saw and then all cement was removed from the patella.  Copiously irrigated the patella.  After final irrigation, a Prostalac mold was used to form a femoral component, and all polyethylene tibia was cemented into the tibia.  The Prostalac was cemented into place.  The knee joint was then copiously irrigated.  I did place beads in the canals of both the femur and the tibia.  After the cement had fully cured, the knee was ranged, irrigated copiously with pulsatile lavage.  Soft tissues were infiltrated with local anesthetic.  The arthrotomy was closed with heavy Vicryl sutures, skin and subcu were irrigated and closed in standard fashion.  Sterile dressing was applied.  There were no complications.    Specimen:  Cultures were sent.    Procedure:  Removal of infected total knee replacement and placement of antibiotic spacer.      The patient tolerated the procedure well and was taken to the recovery room stable.      [name], MD       MAD / PN  D: 11/27/2016 13:04     T: 11/27/2016 16:32  JOB #: [id number]

## 2016-11-27 NOTE — Progress Notes (Signed)
Bedside and Verbal shift change report given to Lattie Haw (Soil scientist) by Cyril Mourning  (offgoing nurse). Report included the following information SBAR.

## 2016-11-27 NOTE — Progress Notes (Signed)
Problem: Falls - Risk of  Goal: *Absence of Falls  Document Schmid Fall Risk and appropriate interventions in the flowsheet.   Outcome: Progressing Towards Goal  Fall Risk Interventions:            Medication Interventions: Patient to call before getting OOB    Elimination Interventions: Call light in reach, Patient to call for help with toileting needs

## 2016-11-27 NOTE — Progress Notes (Addendum)
Patient seen and examined. Dictated # [id number]    IMPRESSION    1. Right prosthetic knee infection    2. History of right breast cancer    3. Iron deficiency anemia    4. Hypothyroidism     5. Bacteriuria       PLAN    1. Will need 6 weeks of IV abx     2. Start ceftriaxone and vanco. Discontinue cefazolin     3. Ff up cx    4. Esr, crp, cbc, cmp in am

## 2016-11-27 NOTE — Progress Notes (Addendum)
Care Management Interventions  PCP Verified by CM: Yes (Dr. Wanda Plump)  Palliative Care Criteria Met (RRAT>21 & CHF Dx)?: No  Mode of Transport at Discharge: Self  Transition of Care Consult (CM Consult): Home Health (agency TBD)  MyChart Signup: No  Discharge Durable Medical Equipment: No (TBD)  Physical Therapy Consult: Yes  Occupational Therapy Consult: No  Speech Therapy Consult: No  Current Support Network: Lives with Spouse  Confirm Follow Up Transport: Family  Plan discussed with Pt/Family/Caregiver: No (paitent sleeping)  [place] Provided?: No  Discharge Location  Discharge Placement: Home with home health (TBD)    Reason for Admission:      Revision of Left Total Knee Replacement (Re Implantation)              RRAT Score:             10      Plan for utilizing home health:        Agency TBD/IV Infusion Agency TBD                Likelihood of Readmission:     low                    Transition of Care Plan:       Home Health and Home infusion     The patient will need 6 weeks of IVabx at discharge. Dr. Duwayne Heck ID is following. CRM will cost out the drug for the daily  co pay once the ID plan is determined. HH orders noted as well for nursing and PT. Attempted to speak with the patient X2 , but she was sleeping. CRM will continue to follow for discharge planning. KJT

## 2016-11-28 LAB — TYPE & SCREEN
ABO/Rh(D): O POS
Antibody screen: NEGATIVE
Status of unit: TRANSFUSED
Status of unit: TRANSFUSED
Unit division: 0
Unit division: 0

## 2016-11-28 LAB — METABOLIC PANEL, COMPREHENSIVE
A-G Ratio: 0.7 — ABNORMAL LOW (ref 1.1–2.2)
ALT (SGPT): 14 U/L (ref 12–78)
AST (SGOT): 35 U/L (ref 15–37)
Albumin: 2.4 g/dL — ABNORMAL LOW (ref 3.5–5.0)
Alk. phosphatase: 90 U/L (ref 45–117)
Anion gap: 8 mmol/L (ref 5–15)
BUN/Creatinine ratio: 12 (ref 12–20)
BUN: 7 MG/DL (ref 6–20)
Bilirubin, total: 0.4 MG/DL (ref 0.2–1.0)
CO2: 24 mmol/L (ref 21–32)
Calcium: 8.2 MG/DL — ABNORMAL LOW (ref 8.5–10.1)
Chloride: 109 mmol/L — ABNORMAL HIGH (ref 97–108)
Creatinine: 0.6 MG/DL (ref 0.55–1.02)
GFR est AA: >60 mL/min/{1.73_m2} (ref >60)
GFR est non-AA: >60 mL/min/{1.73_m2} (ref >60)
Globulin: 3.6 g/dL (ref 2.0–4.0)
Glucose: 93 mg/dL (ref 65–100)
Potassium: 3.2 mmol/L — ABNORMAL LOW (ref 3.5–5.1)
Protein, total: 6 g/dL — ABNORMAL LOW (ref 6.4–8.2)
Sodium: 141 mmol/L (ref 136–145)

## 2016-11-28 LAB — CBC WITH AUTOMATED DIFF
ABS. BASOPHILS: 0 10*3/uL (ref 0.0–0.1)
ABS. EOSINOPHILS: 0.1 10*3/uL (ref 0.0–0.4)
ABS. IMM. GRANS.: 0 10*3/uL (ref 0.00–0.04)
ABS. LYMPHOCYTES: 0.5 10*3/uL — ABNORMAL LOW (ref 0.8–3.5)
ABS. MONOCYTES: 0.3 10*3/uL (ref 0.0–1.0)
ABS. NEUTROPHILS: 4.7 10*3/uL (ref 1.8–8.0)
ABSOLUTE NRBC: 0 10*3/uL (ref 0.00–0.01)
BASOPHILS: 0 % (ref 0–1)
EOSINOPHILS: 2 % (ref 0–7)
HCT: 30.7 % — ABNORMAL LOW (ref 35.0–47.0)
HGB: 9.7 g/dL — ABNORMAL LOW (ref 11.5–16.0)
IMMATURE GRANULOCYTES: 0 % (ref 0.0–0.5)
LYMPHOCYTES: 9 % — ABNORMAL LOW (ref 12–49)
MCH: 29.3 PG (ref 26.0–34.0)
MCHC: 31.6 g/dL (ref 30.0–36.5)
MCV: 92.7 FL (ref 80.0–99.0)
MONOCYTES: 5 % (ref 5–13)
MPV: 9.7 FL (ref 8.9–12.9)
NEUTROPHILS: 84 % — ABNORMAL HIGH (ref 32–75)
NRBC: 0 PER 100 WBC
PLATELET: 161 10*3/uL (ref 150–400)
RBC: 3.31 M/uL — ABNORMAL LOW (ref 3.80–5.20)
RDW: 15 % — ABNORMAL HIGH (ref 11.5–14.5)
WBC: 5.6 10*3/uL (ref 3.6–11.0)

## 2016-11-28 LAB — SED RATE (ESR): Sed rate, automated: 67 mm/hr — ABNORMAL HIGH (ref 0–30)

## 2016-11-28 LAB — C REACTIVE PROTEIN, QT: C-Reactive protein: 21.7 mg/dL — ABNORMAL HIGH (ref 0.00–0.60)

## 2016-11-28 LAB — CULTURE, ANAEROBIC

## 2016-11-28 LAB — TYPE AND SCREEN
ABO/Rh: O POS
Antibody Screen: NEGATIVE
Status: TRANSFUSED
Status: TRANSFUSED
Unit Divison: 0
Unit Divison: 0

## 2016-11-28 MED FILL — ACETAMINOPHEN 500 MG TAB: 500 mg | ORAL | Qty: 2

## 2016-11-28 MED FILL — DOK PLUS 8.6 MG-50 MG TABLET: ORAL | Qty: 1

## 2016-11-28 MED FILL — NORMAL SALINE FLUSH 0.9 % INJECTION SYRINGE: INTRAMUSCULAR | Qty: 10

## 2016-11-28 MED FILL — CEFTRIAXONE 2 GRAM SOLUTION FOR INJECTION: 2 gram | INTRAMUSCULAR | Qty: 2

## 2016-11-28 MED FILL — DOK 100 MG CAPSULE: 100 mg | ORAL | Qty: 1

## 2016-11-28 MED FILL — VANCOMYCIN IN 0.9 % SODIUM CHLORIDE 1.25 GRAM/250 ML IV: 1.25 gram/250 mL | INTRAVENOUS | Qty: 250

## 2016-11-28 MED FILL — ASPIRIN 325 MG TAB, DELAYED RELEASE: 325 mg | ORAL | Qty: 1

## 2016-11-28 MED FILL — TRAMADOL 50 MG TAB: 50 mg | ORAL | Qty: 1

## 2016-11-28 MED FILL — SYNTHROID 112 MCG TABLET: 112 mcg | ORAL | Qty: 1

## 2016-11-28 MED FILL — FAMOTIDINE 20 MG TAB: 20 mg | ORAL | Qty: 1

## 2016-11-28 MED FILL — POLYETHYLENE GLYCOL 3350 17 GRAM (100 %) ORAL POWDER PACKET: 17 gram | ORAL | Qty: 1

## 2016-11-28 NOTE — Progress Notes (Signed)
ID Progress Note  11/28/2016    Subjective:     Afebrile. No dyspnea, abdominal pain, nausea headache or sore throat.    Objective:     Vitals:   Visit Vitals   ??? BP 122/60 (BP 1 Location: Left arm, BP Patient Position: At rest)   ??? Pulse 70   ??? Temp 98.2 ??F (36.8 ??C)   ??? Resp 16   ??? Ht '5\' 5"'  (1.651 m)   ??? Wt 63.5 kg (140 lb)   ??? LMP 04/13/2010   ??? SpO2 95%   ??? BMI 23.3 kg/m2        Tmax:  Temp (24hrs), Avg:98 ??F (36.7 ??C), Min:97.4 ??F (36.3 ??C), Max:98.5 ??F (36.9 ??C)      Exam:    Not in distress   Eyes: pink conjunctivae, anicteric sclerae   No cervical lymphadenopathy   Lungs: clear to auscultation, no rales, wheezes   Heart: s1, s2, no murmurs, rubs or clicks    Abdomen: soft, nontender, no guarding or rebound  Extremities: right knee bandaged     Labs:   Lab Results   Component Value Date/Time    WBC 5.6 11/28/2016 05:25 AM    Hemoglobin (POC) 8.2 (L) 04/12/2015 09:29 AM    HGB 9.7 (L) 11/28/2016 05:25 AM    Hematocrit (POC) 24 (L) 04/12/2015 09:29 AM    HCT 30.7 (L) 11/28/2016 05:25 AM    PLATELET 161 11/28/2016 05:25 AM    MCV 92.7 11/28/2016 05:25 AM     Lab Results   Component Value Date/Time    Sodium 141 11/28/2016 05:25 AM    Potassium 3.2 (L) 11/28/2016 05:25 AM    Chloride 109 (H) 11/28/2016 05:25 AM    CO2 24 11/28/2016 05:25 AM    Anion gap 8 11/28/2016 05:25 AM    Glucose 93 11/28/2016 05:25 AM    BUN 7 11/28/2016 05:25 AM    Creatinine 0.60 11/28/2016 05:25 AM    BUN/Creatinine ratio 12 11/28/2016 05:25 AM    GFR est AA >60 11/28/2016 05:25 AM    GFR est non-AA >60 11/28/2016 05:25 AM    Calcium 8.2 (L) 11/28/2016 05:25 AM    Bilirubin, total 0.4 11/28/2016 05:25 AM    AST (SGOT) 35 11/28/2016 05:25 AM    Alk. phosphatase 90 11/28/2016 05:25 AM    Protein, total 6.0 (L) 11/28/2016 05:25 AM    Albumin 2.4 (L) 11/28/2016 05:25 AM    Globulin 3.6 11/28/2016 05:25 AM    A-G Ratio 0.7 (L) 11/28/2016 05:25 AM    ALT (SGPT) 14 11/28/2016 05:25 AM           Assessment:        1.  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  2.  Iron deficiency anemia.  3.  Hypothyroidism.  4.  Bacteriuria  ??        Recommendations:     1.  She will need 6 weeks of IV antibiotics. Continue ceftriaxone and vanco.     2. Ff up cultures     Blair Hailey, MD

## 2016-11-28 NOTE — Progress Notes (Signed)
Spiritual Care Partner Volunteer visited patient in Rm 565 on 11/28/16.  Documented by:  Marda Stalker, Chaplain, MDiv, MS, [place]  [phone])

## 2016-11-28 NOTE — Progress Notes (Signed)
Bedside shift change report given to Maudie Mercury, RN (oncoming nurse) by Lattie Haw, RN (offgoing nurse). Report included the following information SBAR.

## 2016-11-28 NOTE — Other (Signed)
Bedside shift change report given to Kristen (oncoming nurse) by Kimrey (offgoing nurse). Report included the following information SBAR.

## 2016-11-28 NOTE — Progress Notes (Signed)
Problem: Discharge Planning  Goal: *Discharge to safe environment  Outcome: Progressing Towards Goal  SNF

## 2016-11-28 NOTE — Progress Notes (Signed)
ORTHO    Doing well.  Better after transfusion for symptomatic acute post surgical blood loss.  Cultures positive for staph.  Plan 6 weeks of IV abx.  Rehab Friday.

## 2016-11-28 NOTE — Progress Notes (Signed)
Followed up with patient today for continued PT, but she was off the unit earlier today and then now has just gotten back to bed after being up in the chair for several hours.  She reports R hip and buttock pain with sitting and being supine.  She does have a history of low back pain and is a side sleeper.  Assisted her into sidelying with the pillows for support and this did alleviate her pain.  We will continue progression of strength and mobility training as she is able to tolerate.  Note plans for transfer to SNF rehab for IV abx on Friday.    Gaynelle Arabian, PT

## 2016-11-28 NOTE — Progress Notes (Signed)
Orders received, chart reviewed and patient planning on discharging to SNF for IV anti-biotic A. Patient received L side lying. Deferring OT at this time, she has been up to chair, completing ADLs as able, and off the floor. Will f/u tomorrow for OT evaluation.

## 2016-11-28 NOTE — Progress Notes (Addendum)
CRM spoke with Dr. Duwayne Heck this am. This patient will need 6 weeks of IVabx. (Vanco) dosing TBD. CRM met with the patient's husband Mariyam Remington [phone] and SNF placement will be the plan. CRM provided the SNF list. CRM will continue to follow for discharge planning. KJT

## 2016-11-29 LAB — CULTURE, WOUND W GRAM STAIN: GRAM STAIN: NONE SEEN

## 2016-11-29 MED ORDER — CEFAZOLIN 2 GM/50 ML IN DEXTROSE (ISO-OSMOTIC) IVPB
2 gram/50 mL | Freq: Three times a day (TID) | INTRAVENOUS | Status: DC
Start: 2016-11-29 — End: 2016-12-04
  Administered 2016-11-29 – 2016-12-04 (×15): via INTRAVENOUS

## 2016-11-29 MED ORDER — PHARMACY VANCOMYCIN NOTE
Freq: Once | Status: DC
Start: 2016-11-29 — End: 2016-11-29

## 2016-11-29 MED FILL — DOK 100 MG CAPSULE: 100 mg | ORAL | Qty: 1

## 2016-11-29 MED FILL — CEFTRIAXONE 2 GRAM SOLUTION FOR INJECTION: 2 gram | INTRAMUSCULAR | Qty: 2

## 2016-11-29 MED FILL — ACETAMINOPHEN 500 MG TAB: 500 mg | ORAL | Qty: 2

## 2016-11-29 MED FILL — DOK PLUS 8.6 MG-50 MG TABLET: ORAL | Qty: 1

## 2016-11-29 MED FILL — FAMOTIDINE 20 MG TAB: 20 mg | ORAL | Qty: 1

## 2016-11-29 MED FILL — POLYETHYLENE GLYCOL 3350 17 GRAM (100 %) ORAL POWDER PACKET: 17 gram | ORAL | Qty: 1

## 2016-11-29 MED FILL — NORMAL SALINE FLUSH 0.9 % INJECTION SYRINGE: INTRAMUSCULAR | Qty: 10

## 2016-11-29 MED FILL — ASPIRIN 325 MG TAB, DELAYED RELEASE: 325 mg | ORAL | Qty: 1

## 2016-11-29 MED FILL — VANCOMYCIN IN 0.9 % SODIUM CHLORIDE 1.25 GRAM/250 ML IV: 1.25 gram/250 mL | INTRAVENOUS | Qty: 250

## 2016-11-29 MED FILL — CEFAZOLIN 2 GM/50 ML IN DEXTROSE (ISO-OSMOTIC) IVPB: 2 gram/50 mL | INTRAVENOUS | Qty: 50

## 2016-11-29 MED FILL — PHARMACY VANCOMYCIN NOTE: Qty: 1

## 2016-11-29 MED FILL — SYNTHROID 112 MCG TABLET: 112 mcg | ORAL | Qty: 1

## 2016-11-29 NOTE — Progress Notes (Signed)
Bedside and Verbal shift change report given to Zach (oncoming nurse) by Liz (offgoing nurse). Report included the following information SBAR.

## 2016-11-29 NOTE — Progress Notes (Addendum)
CRM met with the patient and her husband to discuss SNF placement. The choices are [facility], [facility], and [facility]. CRM will send referrals to [facility] via [facility] and [facility] via [facility] [facility]. The patient has requested a private room where ever she goes. She will need therapy (NWB) and 6 weeks of IV abx/picc line placement. IV orders pending. CRM will follow for bed availability. KJT    12pm: CRM spoke with Barnetta Chapel from [facility] , they can offer this patient a private room. CRM updated the patient and her husband.  KJT    3:05pm: [facility] does not have a bed for this patient. [facility] has not responded yet. Will follow. KJT

## 2016-11-29 NOTE — Progress Notes (Signed)
Bedside and Verbal shift change report given to [place], Therapist, sports (oncoming nurse) by Earna Coder, RN (offgoing nurse). Report included the following information SBAR, Kardex, Procedure Summary, Intake/Output, MAR, Accordion and Recent Results.

## 2016-11-29 NOTE — Progress Notes (Signed)
Problem: Mobility Impaired (Adult and Pediatric)  Goal: *Acute Goals and Plan of Care (Insert Text)  Physical Therapy Goals  Initiated 11/27/2016  1.  Patient will move from supine to sit and sit to supine  in bed with modified independence within 7 day(s).    2.  Patient will transfer from bed to chair and chair to bed with modified independence using the least restrictive device within 7 day(s).  3.  Patient will perform sit to stand with modified independence within 7 day(s).  4.  Patient will ambulate with modified independence for 100 feet with the least restrictive device within 7 day(s).   5.  Patient will ascend/descend 1 stairs with 0 handrail(s) with modified independence within 7 day(s).   physical Therapy TREATMENT  Patient: Jillian Bean (76 y.o. female)  Date: 11/29/2016  Diagnosis: FAILED TOTAL RIGHT KNEE REPLACEMENT  Failed total knee, right (HCC) <principal problem not specified>  Procedure(s) (LRB):   REMOVAL BOTH COMPONENTS RIGHT KNEE, REMOVAL INFECTED RIGHT KNEE, REVISION RIGHT TOTAL KNEE (Right) 3 Days Post-Op  Precautions: Fall, WBAT  Chart, physical therapy assessment, plan of care and goals were reviewed.    ASSESSMENT:  Patient supine upon arrival, recently back to bed with OT but agreeable to OOB for gait training and back to bed.  Patient very anxious regarding perceptions of spacer movement within knee during dynamic activity but educated regarding anatomy and difference in spacer vs prosthetic knee.  Patient also anxious regarding setting foot down on the floor for fear she is weight bearing on it.  Educated regarding this with reassurance resting the foot on the floor was acceptable as long as she was not shifting weight to it during activity (ie while lifting bottom from bed side scooting).  Performed gait training to and from doorway with MIN A needed during gait for occasional posterior or lateral trunk sway, no overt LOB.   Patient fatigued quickly with UEs but maintained NWB throughout.  Returned to the bed where patient instructed in getting in/out of bed using gait belt on RLE, followed by LE therex (hip abd (AAROM), quad sets, ankle DF stretch, heel sets).  Patient continues to be appropriate and a good candidate for skilled rehab post-acute to assist with regaining PLOF and is unsafe to d/c home at this time due to level of care/assistance required.    Progression toward goals:  [x]       Improving appropriately and progressing toward goals  []       Improving slowly and progressing toward goals  []       Not making progress toward goals and plan of care will be adjusted     PLAN:  Patient continues to benefit from skilled intervention to address the above impairments.  Continue treatment per established plan of care.  Discharge Recommendations:  Skilled Nursing Facility  Further Equipment Recommendations for Discharge:  TBD at SNF     SUBJECTIVE:   Patient stated ???I'm just so worried about how it's moving around inside the knee when I move that leg.???    OBJECTIVE DATA SUMMARY:   Critical Behavior:  Neurologic State: Alert  Orientation Level: Oriented X4  Cognition: Appropriate for age attention/concentration, Appropriate decision making, Appropriate safety awareness, Follows commands  Safety/Judgement: Awareness of environment, Good awareness of safety precautions, Insight into deficits  Range of Motion:  AROM: Within functional limits (BUE)  PROM: Within functional limits (BUE)  Functional Mobility Training:  Bed Mobility:        Sit to Supine: Minimum assistance;Contact guard assistance (CGA with instruction in use of gait belt for RLE)           Transfers:  Sit to Stand: Contact guard assistance  Stand to Sit: Contact guard assistance        Bed to Chair: Contact guard assistance                    Balance:  Sitting: Intact  Standing: Impaired  Standing - Static: Good  Standing - Dynamic : Fair   Ambulation/Gait Training:  Distance (ft): 25 Feet (ft)  Assistive Device: Gait belt;Walker, rolling  Ambulation - Level of Assistance: Minimal assistance;Adaptive equipment;Additional time        Gait Abnormalities: Decreased step clearance (Hop-to pattern)  Right Side Weight Bearing: Non-weight bearing  Left Side Weight Bearing: Full  Base of Support: Shift to left     Speed/Cadence: Pace decreased (<100 feet/min)  Step Length: Left shortened                    Stairs:           Therapeutic Exercises:     EXERCISE   Sets   Reps   Active Active Assist   Passive Self ROM   Comments   Ankle Pumps 1 20 [x]                                         []                                         []                                         []                                            Quad Sets 1 10 [x]                                         []                                         []                                         []                                            Glut Sets 1 10 [x]                                         []                                         []                                         []   Hip Abd 1 10 []                                         [x]                                         []                                         []                                            Knee Extension Stretch     []                                           []                                           []                                           []                                            Heel Slides   []                                         []                                         []                                         []                                            Long Arc Quads   []                                         []                                         []                                         []   Knee Flexion Stretch   []                                         []                                         []                                         []                                            Straight Leg Raises   []                                         []                                         []                                         []                                              Pain:  Pain Scale 1: Numeric (0 - 10)  Pain Intensity 1: 0              Activity Tolerance:   Low - 25' with MIN A and RW    Please refer to the flowsheet for vital signs taken during this treatment.  After treatment:   []  Patient left in no apparent distress sitting up in chair  [x]  Patient left in no apparent distress in bed  [x]  Call bell left within reach  [x]  Nursing notified  [x]  Caregiver present  []  Bed alarm activated    COMMUNICATION/COLLABORATION:   The patient???s plan of care was discussed with: Registered Nurse    Johny Drilling, PT   Time Calculation: 30 mins

## 2016-11-29 NOTE — Progress Notes (Signed)
Complaints: none  Events: none    GEN:  NAD. AOx3. Afebrile.  ABD:  S/NT/ND   RLE:  Dressing C/D/I    5/5 motor    Calf nttp (Bilat)    Sensate all distribution to light touch    1+ dp/pt pulses, foot perfused      Lab Results   Component Value Date/Time    HGB 9.7 (L) 11/28/2016 05:25 AM    INR 1.0 11/21/2016 01:34 PM       Lab Results   Component Value Date/Time    Sodium 141 11/28/2016 05:25 AM    Potassium 3.2 (L) 11/28/2016 05:25 AM    Chloride 109 (H) 11/28/2016 05:25 AM    CO2 24 11/28/2016 05:25 AM    BUN 7 11/28/2016 05:25 AM    Creatinine 0.60 11/28/2016 05:25 AM    Calcium 8.2 (L) 11/28/2016 05:25 AM            POD#2 RIGHT KNEE REVISION. Doing Well. Her pain is well controlled on current medications. She is doing well in PT (continue in knee immobilizer). She has made good progress over the last two days. We will continue to work with therapy and infectious disease to come up with a satisfactory discharge plan.    ABX: Per infectious disease recommendations. Cultures pending.  PATHWAY: D/C [facility] per protocol  DVT Prophylaxis: ASA, SCD  Weight Bearing: WBAT RLE   Pain Control: PRN oral narcotics, celebrex  Anticipated Discharge Date: Possibly tomorrow  Disposition: SNF

## 2016-11-29 NOTE — Progress Notes (Addendum)
Problem: Self Care Deficits Care Plan (Adult)  Goal: *Acute Goals and Plan of Care (Insert Text)  Occupational Therapy Goals  Initiated 11/29/2016  1.  Patient will perform lower body ADLs with modified independence using reacher/ dressing stick/ sock aide within 7 day(s).  2.  Patient will perform bathing with modified independence using long-handled sponge within 7 day(s).  3.  Patient will perform all aspects of toileting with modified independence within  7day(s).  4. Patient will perform toilet transfers at modified independence using Walkers, Type: Rolling Walker within 7 days.  5. Patient will tolerate greater than 5 minutes of standing without a rest break at modified independence using Walkers, Type: Rolling Walker during ADL's within 7 days.    Occupational Therapy EVALUATION  Patient: Jillian Bean (76 y.o. female)  Date: 11/29/2016  Primary Diagnosis: FAILED TOTAL RIGHT KNEE REPLACEMENT  Failed total knee, right (HCC)  Procedure(s) (LRB):   REMOVAL BOTH COMPONENTS RIGHT KNEE, REMOVAL INFECTED RIGHT KNEE, REVISION RIGHT TOTAL KNEE (Right) 3 Days Post-Op   Precautions:  Fall, NWB RLE    ASSESSMENT :  Based on the objective data described below, the patient presents with NWB RLE, impaired dynamic standing balance, impaired functional mobility, impaired functional reach to RLE, and impaired ADL independence.  Knee immobilizer in place for entirety of evaluation.  Patient received sitting on BSC, having just bathed self (infer min-A for RLE).  Patient performed UB dressing and grooming with set-up seated on BSC.  Required mod-A for LB dressing due to impaired access to R foot.  Patient receptive to education on AE for LB dressing, used sock aide to don R sock with min-A, practiced threading pants with reacher pants but declined fully donning.  Infer CGA for toileting.  Patient performs sit-stand and ambulates to/ from bathroom using RW.  Requires min-A for sit-supine for lifting RLE into bed,  instructed on use of gait belt/ using LLE to lift RLE.  Patient is below functional baseline and would benefit from skilled OT in rehab setting.  Per chart review, patient will require SNF at d/c for 6 wks IV antibiotics.       Patient will benefit from skilled intervention to address the above impairments.  Patient???s rehabilitation potential is considered to be Good  Factors which may influence rehabilitation potential include:   [x]                 None noted  []                 Mental ability/status  []                 Medical condition  []                 Home/family situation and support systems  []                 Safety awareness  []                 Pain tolerance/management  []                 Other:      PLAN :  Recommendations and Planned Interventions:  [x]                   Self Care Training                  [x]            Therapeutic Activities  [x]   Functional Mobility Training    []            Cognitive Retraining  [x]                   Therapeutic Exercises           [x]            Endurance Activities  [x]                   Balance Training                   []            Neuromuscular Re-Education  []                   Visual/Perceptual Training     [x]       Home Safety Training  [x]                   Patient Education                 [x]            Family Training/Education  []                   Other (comment):    Frequency/Duration: Patient will be followed by occupational therapy 5 times a week to address goals.  Discharge Recommendations: Rehab  Further Equipment Recommendations for Discharge: TBD     SUBJECTIVE:   Patient stated ???I can't reach my right foot.???    OBJECTIVE DATA SUMMARY:   HISTORY:   Past Medical History:   Diagnosis Date   ??? Anemia    ??? Back pain 09/01/2007   ??? Cancer (HCC)     R breast   ??? Chronic pain     BACK PAIN   ??? Coagulation disorder ([place])     IRON DEFFICIENCY ANEMIA   ??? Colonic polyps 08/31/1998   ??? 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 Hiatal hernia 07/11/2011    Dr.sobieski   ??? Hypercholesteremia 09/01/2007   ??? Hypothyroidism 09/01/2003   ??? 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 Nausea & vomiting    ??? OA (osteoarthritis) 09/01/2007   ??? Other ill-defined conditions(799.89)     VERTIGO   ??? Plantar fasciitis 09/01/2007   ??? S/P colonoscopy 10/10/2007   ??? Thyroid disease      Past Surgical History:   Procedure Laterality Date   ??? BREAST SURGERY PROCEDURE UNLISTED  1993    Breast Reduction   ??? COLONOSCOPY N/A 11/12/2016    COLONOSCOPY performed by Burnett Harry, MD at [facility]   ??? COLONOSCOPY,DIAGNOSTIC  11/12/2016        ??? ENDOSCOPY, COLON, DIAGNOSTIC  7/12/18/08    7/05 Dr Maudry Diego   ??? HX BREAST LUMPECTOMY Right 2016   ??? HX COLONOSCOPY  2014    Dr Illene Silver   ??? HX GI      COLONOSCOPY   ??? HX GYN      Hysterectomy in her 25's   ??? HX HEENT  04/07/10    thyroid biopsy neg dr Leward Quan   ??? HX HEENT      wisdom teeth extraction   ??? HX ORTHOPAEDIC  08/22/09    left TKR   ??? HX ORTHOPAEDIC  08/13/12    right tkr   ??? HX  OTHER SURGICAL  2013    MINIMALLY INVASIVE LUMBAR DECOMPRESSION   ??? UPPER GI ENDOSCOPY,BIOPSY  11/12/2016            Prior Level of Function/Environment/Context: indep ADLs and IADLs    Expanded or extensive additional review of patient history:     Home Situation  Home Environment: Other (comment)  # Steps to Enter:  (1)  One/Two Story Residence: Two Stage manager Available: No  Living Alone: No  Support Systems: Copy  Patient Expects to be Discharged to:: Private residence  Current DME Used/Available at Home: Kasandra Knudsen, straight, Environmental consultant (see previous documentation)  []   Right hand dominant   []   Left hand dominant    EXAMINATION OF PERFORMANCE DEFICITS:  Cognitive/Behavioral Status:  Neurologic State: Alert  Orientation Level: Oriented X4  Cognition: Appropriate for age attention/concentration;Appropriate  decision making;Appropriate safety awareness;Follows commands  Perception: Appears intact  Perseveration: No perseveration noted  Safety/Judgement: Awareness of environment;Good awareness of safety precautions;Insight into deficits    Skin: visible skin appears intact.  Knee immobilizer in place for entirety of evaluation    Edema: none noted    Hearing:  Auditory  Auditory Impairment: None    Vision/Perceptual:                           Acuity: Within Defined Limits         Range of Motion:    AROM: Within functional limits (BUE)  PROM: Within functional limits (BUE)                      Strength:    Strength: Within functional limits (BUE)                Coordination:  Coordination: Within functional limits  Fine Motor Skills-Upper: Left Intact;Right Intact    Gross Motor Skills-Upper: Left Intact;Right Intact    Tone & Sensation:    Tone: Normal  Sensation: Intact                      Balance:  Sitting: Intact  Standing: Impaired  Standing - Static: Good  Standing - Dynamic : Fair    Functional Mobility and Transfers for ADLs:  Bed Mobility:  Sit to Supine: Minimum assistance (for lifting RLE, practiced using LLE/ gait belt to lift RLE)    Transfers:  Sit to Stand: Contact guard assistance  Stand to Sit: Contact guard assistance  Bed to Chair: Contact guard assistance  Toilet Transfer : Contact guard assistance (inferred)    ADL Assessment:  Feeding: Independent (inferred)    Oral Facial Hygiene/Grooming: Supervision (sitting at sink on [facility])    Bathing: Minimum assistance (for RLE, performed sponge bath, reached rest of body)    Upper Body Dressing: Setup (donned/ doff gown and t-shirt sitting on BSC)    Lower Body Dressing: Moderate assistance (unable to reach R foot, threads L foot for pants/ sock)    Toileting: Contact guard assistance (inferred)                ADL Intervention and task modifications:         Patient receptive to education on AE for LB dressing, used sock aide to  don R sock with min-A, practiced threading pants with reacher pants but declined fully donning.  Cognitive Retraining  Safety/Judgement: Awareness of environment;Good awareness of safety precautions;Insight into deficits      Dressing joint: Patient instructed and demonstrated understanding to don/doff RLE first/last with Moderate Assistance.  Patient instructed and demonstrated to don all clothing while sitting prior to standing, doff all clothing to knees while standing, then sit to doff clothing off from knees to feet in order to facilitate fall prevention, pain management, and energy conservation with Moderate Assistance.       Functional Measure:  Barthel Index:    Bathing: 0  Bladder: 10  Bowels: 10  Grooming: 5  Dressing: 5  Feeding: 10  Mobility: 0  Stairs: 0  Toilet Use: 5  Transfer (Bed to Chair and Back): 10  Total: 55       Barthel and G-code impairment scale:  Percentage of impairment CH  0% CI  1-19% CJ  20-39% CK  40-59% CL  60-79% CM  80-99% CN  100%   Barthel Score 0-100 100 99-80 79-60 59-40 20-39 1-19   0   Barthel Score 0-20 20 17-19 13-16 9-12 5-8 1-4 0      The Barthel ADL Index: Guidelines  1. The index should be used as a record of what a patient does, not as a record of what a patient could do.  2. The main aim is to establish degree of independence from any help, physical or verbal, however minor and for whatever reason.  3. The need for supervision renders the patient not independent.  4. A patient's performance should be established using the best available evidence. Asking the patient, friends/relatives and nurses are the usual sources, but direct observation and common sense are also important. However direct testing is not needed.  5. Usually the patient's performance over the preceding 24-48 hours is important, but occasionally longer periods will be relevant.  6. Middle categories imply that the patient supplies over 50 per cent of the effort.   7. Use of aids to be independent is allowed.    Daneen Schick., Barthel, D.W. [phone]). Functional evaluation: the Barthel Index. [place] (14)2.  Lucianne Lei der [place], J.J.M.F, [place], Diona Browner., Oris Drone., [place], [place] (1999). Measuring the change indisability after inpatient rehabilitation; comparison of the responsiveness of the Barthel Index and Functional Independence Measure. Journal of Neurology, Neurosurgery, and Psychiatry, 66(4), [phone].  Wilford Sports, N.J.A, [place],  W.J.M, & [place], M.A. (2004.) Assessment of post-stroke quality of life in cost-effectiveness studies: The usefulness of the Barthel Index and the EuroQoL-5D. Quality of Life Research, 13, 427-43       G codes:  In compliance with CMS???s Claims Based Outcome Reporting, the following G-code set was chosen for this patient based on their primary functional limitation being treated:    The outcome measure chosen to determine the severity of the functional limitation was the Barthel Index with a score of 55/100 which was correlated with the impairment scale.    ? Self Care:    [id number] - CURRENT STATUS: CK - 40%-59% impaired, limited or restricted   H8527 - GOAL STATUS: CI - 1%-19% impaired, limited or restricted   P8242 - D/C STATUS:  ---------------To be determined---------------     Occupational Therapy Evaluation Charge Determination   History Examination Decision-Making   LOW Complexity : Brief history review  LOW Complexity : 1-3 performance deficits relating to physical, cognitive , or psychosocial skils that result in activity limitations and / or participation restrictions  LOW Complexity : No comorbidities  that affect functional and no verbal or physical assistance needed to complete eval tasks       Based on the above components, the patient evaluation is determined to be of the following complexity level: LOW     Pain:  Pain Scale 1: Numeric (0 - 10)  Pain Intensity 1: 0              Activity Tolerance:   VSS     After treatment:   []  Patient left in no apparent distress sitting up in chair  [x]  Patient left in no apparent distress in bed  [x]  Call bell left within reach  [x]  Nursing notified  []  Caregiver present  []  Bed alarm activated     COMMUNICATION/EDUCATION:   The patient???s plan of care was discussed with: Physical Therapist and Registered Nurse.  [x]     Home safety education was provided and the patient/caregiver indicated understanding.  [x]     Patient/family have participated as able in goal setting and plan of care.  [x]     Patient/family agree to work toward stated goals and plan of care.  []     Patient understands intent and goals of therapy, but is neutral about his/her participation.  []     Patient is unable to participate in goal setting and plan of care.  This patient???s plan of care is appropriate for delegation to OTA.    Thank you for this referral.  Bailey Mech, OT  Time Calculation: 33 mins

## 2016-11-29 NOTE — Progress Notes (Signed)
11/29/16 2032   Vital Signs   Temp (!) 101.5 ??F (38.6 ??C)   Temp Source Oral   Pulse (Heart Rate) 91   Heart Rate Source Monitor   Cardiac Rhythm Sinus Tach   Resp Rate 16   O2 Sat (%) 94 %   Level of Consciousness Alert   BP 150/66   MAP (Calculated) 94   BP 1 Method Automatic   BP 1 Location Left arm   BP Patient Position At rest   MEWS Score 3     2138  Paged Dr Lajuana Ripple regarding MEWS of 3.  Encouraged IS and tylenol.  No further orders given

## 2016-11-29 NOTE — Progress Notes (Signed)
Problem: Discharge Planning  Goal: *Discharge to safe environment  Outcome: Progressing Towards Goal  SNF

## 2016-11-29 NOTE — Progress Notes (Signed)
Home IV Orders  1.  Diagnosis:  Right posthetic knee infection with MSSE  2.  Routine PICC  Care  3.  Antibiotic:  Cefazolin 2 gm q8 hours IV  4.  Lab each Monday:   CBC/diff/platelets   CMP   ESR   CRP     5.  Lab each Thursday:   CBC/diff/platelets   BUN   Creatinine    6.  Fax lab to Dr. Patsey Berthold @ [phone].  7.  Call Dr. Patsey Berthold @ [phone] for fever >100.5, infection at PICC site, diarrhea, WBC > 15 or < 3, cr > 1.8  8.  Duration of therapy: last day of therapy should be Jan 07, 2017   Please call Dr. Patsey Berthold @ [phone] before stopping therapy.  9.  Allergies:    Allergies   Allergen Reactions   ??? Atorvastatin Myalgia   ??? Zocor [Simvastatin] Other (comments) and Diarrhea     Other reaction(s): Adverse reaction to substance   Myalgias     ??? Celebrex [Celecoxib] Other (comments)     Aggitation   ??? Gabapentin Other (comments)     FELT UNSTEADY ON MY FEET   ??? Levaquin [Levofloxacin] Palpitations   ??? Nexium [Esomeprazole Magnesium] Diarrhea   ??? Other Medication Swelling     Latanoprost drops   ??? Oxycontin [Oxycodone] Itching   ??? Pravastatin Nausea and Vomiting   ??? Statins-Hmg-Coa Reductase Inhibitors Other (comments)     lymphacytic colitis     ??? Yellow Dye Hives     10. Make appointment in 2-3 weeks    Blair Hailey, MD

## 2016-11-29 NOTE — Progress Notes (Signed)
ID Progress Note  11/29/2016    Subjective:     Afebrile. No dyspnea, abdominal pain, nausea headache or sore throat.    Objective:     Vitals:   Visit Vitals   ??? BP 121/56 (BP 1 Location: Left arm, BP Patient Position: At rest)   ??? Pulse 80   ??? Temp 99.3 ??F (37.4 ??C)   ??? Resp 14   ??? Ht '5\' 5"'  (1.651 m)   ??? Wt 63.5 kg (140 lb)   ??? LMP 04/13/2010   ??? SpO2 98%   ??? BMI 23.3 kg/m2        Tmax:  Temp (24hrs), Avg:98.6 ??F (37 ??C), Min:98.1 ??F (36.7 ??C), Max:99.3 ??F (37.4 ??C)      Exam:    Not in distress   Eyes: pink conjunctivae, anicteric sclerae   No cervical lymphadenopathy   Lungs: clear to auscultation, no rales, wheezes   Heart: s1, s2, no murmurs, rubs or clicks    Abdomen: soft, nontender, no guarding or rebound  Extremities: right knee bandaged     Labs:   Lab Results   Component Value Date/Time    WBC 5.6 11/28/2016 05:25 AM    Hemoglobin (POC) 8.2 (L) 04/12/2015 09:29 AM    HGB 9.7 (L) 11/28/2016 05:25 AM    Hematocrit (POC) 24 (L) 04/12/2015 09:29 AM    HCT 30.7 (L) 11/28/2016 05:25 AM    PLATELET 161 11/28/2016 05:25 AM    MCV 92.7 11/28/2016 05:25 AM     Lab Results   Component Value Date/Time    Sodium 141 11/28/2016 05:25 AM    Potassium 3.2 (L) 11/28/2016 05:25 AM    Chloride 109 (H) 11/28/2016 05:25 AM    CO2 24 11/28/2016 05:25 AM    Anion gap 8 11/28/2016 05:25 AM    Glucose 93 11/28/2016 05:25 AM    BUN 7 11/28/2016 05:25 AM    Creatinine 0.60 11/28/2016 05:25 AM    BUN/Creatinine ratio 12 11/28/2016 05:25 AM    GFR est AA >60 11/28/2016 05:25 AM    GFR est non-AA >60 11/28/2016 05:25 AM    Calcium 8.2 (L) 11/28/2016 05:25 AM    Bilirubin, total 0.4 11/28/2016 05:25 AM    AST (SGOT) 35 11/28/2016 05:25 AM    Alk. phosphatase 90 11/28/2016 05:25 AM    Protein, total 6.0 (L) 11/28/2016 05:25 AM    Albumin 2.4 (L) 11/28/2016 05:25 AM    Globulin 3.6 11/28/2016 05:25 AM    A-G Ratio 0.7 (L) 11/28/2016 05:25 AM    ALT (SGPT) 14 11/28/2016 05:25 AM           Assessment:        1.  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  2.  Iron deficiency anemia.  3.  Hypothyroidism.  4.  Bacteriuria  ??        Recommendations:     1.  She will need 6 weeks of IV antibiotics. She is growing MSSE. Will shift to cefazolin. I will write orders.       Bettye Sitton Sallyanne Havers, MD

## 2016-11-30 ENCOUNTER — Inpatient Hospital Stay: Admit: 2016-11-30 | Payer: MEDICARE | Primary: Internal Medicine

## 2016-11-30 LAB — CBC WITH AUTOMATED DIFF
ABS. BASOPHILS: 0 10*3/uL (ref 0.0–0.1)
ABS. EOSINOPHILS: 0.1 10*3/uL (ref 0.0–0.4)
ABS. IMM. GRANS.: 0 10*3/uL (ref 0.00–0.04)
ABS. LYMPHOCYTES: 0.8 10*3/uL (ref 0.8–3.5)
ABS. MONOCYTES: 0.3 10*3/uL (ref 0.0–1.0)
ABS. NEUTROPHILS: 2.6 10*3/uL (ref 1.8–8.0)
ABSOLUTE NRBC: 0 10*3/uL (ref 0.00–0.01)
BASOPHILS: 0 % (ref 0–1)
EOSINOPHILS: 2 % (ref 0–7)
HCT: 30 % — ABNORMAL LOW (ref 35.0–47.0)
HGB: 9.3 g/dL — ABNORMAL LOW (ref 11.5–16.0)
IMMATURE GRANULOCYTES: 1 % — ABNORMAL HIGH (ref 0.0–0.5)
LYMPHOCYTES: 20 % (ref 12–49)
MCH: 28.5 PG (ref 26.0–34.0)
MCHC: 31 g/dL (ref 30.0–36.5)
MCV: 92 FL (ref 80.0–99.0)
MONOCYTES: 7 % (ref 5–13)
MPV: 9.6 FL (ref 8.9–12.9)
NEUTROPHILS: 70 % (ref 32–75)
NRBC: 0 PER 100 WBC
PLATELET: 169 10*3/uL (ref 150–400)
RBC: 3.26 M/uL — ABNORMAL LOW (ref 3.80–5.20)
RDW: 14.8 % — ABNORMAL HIGH (ref 11.5–14.5)
WBC: 3.8 10*3/uL (ref 3.6–11.0)

## 2016-11-30 LAB — CULTURE, WOUND W GRAM STAIN: GRAM STAIN: NONE SEEN

## 2016-11-30 LAB — METABOLIC PANEL, BASIC
Anion gap: 6 mmol/L (ref 5–15)
BUN/Creatinine ratio: 10 — ABNORMAL LOW (ref 12–20)
BUN: 7 MG/DL (ref 6–20)
CO2: 27 mmol/L (ref 21–32)
Calcium: 8.4 MG/DL — ABNORMAL LOW (ref 8.5–10.1)
Chloride: 108 mmol/L (ref 97–108)
Creatinine: 0.68 MG/DL (ref 0.55–1.02)
GFR est AA: >60 mL/min/{1.73_m2} (ref >60)
GFR est non-AA: >60 mL/min/{1.73_m2} (ref >60)
Glucose: 100 mg/dL (ref 65–100)
Potassium: 3.2 mmol/L — ABNORMAL LOW (ref 3.5–5.1)
Sodium: 141 mmol/L (ref 136–145)

## 2016-11-30 LAB — VANCOMYCIN, TROUGH: Vancomycin,trough: 10.3 ug/mL — ABNORMAL HIGH (ref 5.0–10.0)

## 2016-11-30 MED FILL — ACETAMINOPHEN 500 MG TAB: 500 mg | ORAL | Qty: 2

## 2016-11-30 MED FILL — CEFAZOLIN 2 GM/50 ML IN DEXTROSE (ISO-OSMOTIC) IVPB: 2 gram/50 mL | INTRAVENOUS | Qty: 50

## 2016-11-30 MED FILL — ASPIRIN 325 MG TAB, DELAYED RELEASE: 325 mg | ORAL | Qty: 1

## 2016-11-30 MED FILL — SYNTHROID 112 MCG TABLET: 112 mcg | ORAL | Qty: 1

## 2016-11-30 MED FILL — NORMAL SALINE FLUSH 0.9 % INJECTION SYRINGE: INTRAMUSCULAR | Qty: 40

## 2016-11-30 MED FILL — NORMAL SALINE FLUSH 0.9 % INJECTION SYRINGE: INTRAMUSCULAR | Qty: 10

## 2016-11-30 MED FILL — FAMOTIDINE 20 MG TAB: 20 mg | ORAL | Qty: 1

## 2016-11-30 MED FILL — DOK PLUS 8.6 MG-50 MG TABLET: ORAL | Qty: 1

## 2016-11-30 MED FILL — POLYETHYLENE GLYCOL 3350 17 GRAM (100 %) ORAL POWDER PACKET: 17 gram | ORAL | Qty: 1

## 2016-11-30 MED FILL — DOK 100 MG CAPSULE: 100 mg | ORAL | Qty: 1

## 2016-11-30 NOTE — Progress Notes (Signed)
Problem: Self Care Deficits Care Plan (Adult)  Goal: *Acute Goals and Plan of Care (Insert Text)  Occupational Therapy Goals  Initiated 11/29/2016  1.  Patient will perform lower body ADLs with modified independence using reacher/ dressing stick/ sock aide within 7 day(s).  2.  Patient will perform bathing with modified independence using long-handled sponge within 7 day(s).  3.  Patient will perform all aspects of toileting with modified independence within  7day(s).  4. Patient will perform toilet transfers at modified independence using Walkers, Type: Rolling Walker within 7 days.  5. Patient will tolerate greater than 5 minutes of standing without a rest break at modified independence using Walkers, Type: Rolling Walker during ADL's within 7 days.     Occupational Therapy TREATMENT  Patient: Jillian Bean (76 y.o. female)  Date: 11/30/2016  Diagnosis: FAILED TOTAL RIGHT KNEE REPLACEMENT  Failed total knee, right (HCC) <principal problem not specified>  Procedure(s) (LRB):   REMOVAL BOTH COMPONENTS RIGHT KNEE, REMOVAL INFECTED RIGHT KNEE, REVISION RIGHT TOTAL KNEE (Right) 4 Days Post-Op  Precautions: NWB  Chart, occupational therapy assessment, plan of care, and goals were reviewed.    ASSESSMENT:  Based on the objective data described below, the patient presents with NWB RLE, impaired dynamic standing balance, impaired functional mobility, impaired functional reach to RLE, and impaired ADL independence.  Knee immobilizer in place for entirety of evaluation.  Patient performs supine-sit with CGA and additional time, sit-supine with CGA and additional time using gait belt to lift RLE into bed.  Patient practiced doffing socks with dressing stick, donning socks with sock-aide with set-up.  Patient also practiced threading shorts seated with additional time using reacher, thread RLE first over knee immobilizer, stood with CGA to raise to waist, alternating hands/ keeping one on RW with CGA.  Patient  ambulated to sink with CGA while maintaining RLE NWB, washed hands at sink with CGA alternating hands/ keeping one on RW.   Patient also practiced tricep pushups with hip clearance to promote strengthening due to increased reliance on BUE.  Patient is below functional baseline and would benefit from skilled OT in rehab setting.  Per chart review, patient will require SNF at d/c for 6 wks IV antibiotics.       Progression toward goals:  [x]        Improving appropriately and progressing toward goals  []        Improving slowly and progressing toward goals  []        Not making progress toward goals and plan of care will be adjusted     PLAN:  Patient continues to benefit from skilled intervention to address the above impairments.  Continue treatment per established plan of care.  Discharge Recommendations:  Rehab  Further Equipment Recommendations for Discharge: TBD     SUBJECTIVE:   Patient stated ???My arms are tired now.???    OBJECTIVE DATA SUMMARY:   Cognitive/Behavioral Status:  Neurologic State: Alert  Orientation Level: Oriented X4  Cognition: Appropriate decision making;Appropriate for age attention/concentration;Appropriate safety awareness;Follows commands  Perception: Appears intact  Perseveration: No perseveration noted  Safety/Judgement: Awareness of environment;Insight into deficits;Good awareness of safety precautions    Functional Mobility and Transfers for ADLs:  Bed Mobility:  Supine to Sit: Contact guard assistance;Assist x1;Additional time.  Sit to Supine: Contact guard assistance; using gait belt to lift RLE into bed    Transfers:  Sit to Stand: Supervision;Assist x1          Balance:  Sitting: Intact  Standing: Intact;With support  Standing - Static: Constant support;Good  Standing - Dynamic : Good    ADL Intervention:       Grooming  Washing Hands: Contact guard assistance;Compensatory technique training (standing at sink, alternating hands/ keeping one on RW)                    Lower Body Dressing Assistance  Pants With Elastic Waist: Supervision/set-up;Compensatory technique training (threads shorts using reacher, stood with CGA to raise)  Socks: Supervision/set-up;Compensatory technique training (doffs using dressing stick, dons using sock aide)         Cognitive Retraining  Safety/Judgement: Awareness of environment;Insight into deficits;Good awareness of safety precautions       Therapeutic Exercises:   Patient performed 10 tricep push-ups with hip clearance sitting EOB to promote strengthening due to increased reliance on BUE for mobility.       Pain:  Pain Scale 1: Numeric (0 - 10)  Pain Intensity 1: 4 in R groin    Nursing notified           Activity Tolerance:   VSS    After treatment:   []  Patient left in no apparent distress sitting up in chair  [x]  Patient left in no apparent distress in bed  [x]  Call bell left within reach  [x]  Nursing notified  []  Caregiver present  []  Bed alarm activated    COMMUNICATION/COLLABORATION:   The patient???s plan of care was discussed with: Registered Nurse    Bailey Mech, OT  Time Calculation: 24 mins

## 2016-11-30 NOTE — Progress Notes (Addendum)
IV orders noted. CRM spoke with Dr. Duwayne Heck. This patient is not ready due to fevers. The patient will need a picc line placed before discharged and this cannot be done today due to the fever. [facility] has a private room for this patient. [facility] does not have a bed and CRM LVM for [facility] and emailed the Scientist, physiological (they have not responded) Will follow. KJT    [facility] plans to hold a private room for this patient if she would be able to discharge over the weekend. KJT    10;20 AM CRM spoke with Electrical engineer at [facility]. She is reviewing the case now. KJT    11:45pm: The patient was accepted to [facility]. CRM will let the patient and her husband know. The discharge date is not determined yet. IV orders are available. Picc line pending once patient is fever free. KJT    12:36pm CRM met with the patient and her husband. They have chosen [facility]. The patient is pending a picc line placement when fever free/per ID. Orders are available in the chart. The patient will need ambulance transport at discharge. KJT

## 2016-11-30 NOTE — Progress Notes (Signed)
Problem: Mobility Impaired (Adult and Pediatric)  Goal: *Acute Goals and Plan of Care (Insert Text)  Physical Therapy Goals  Initiated 11/27/2016  1.  Patient will move from supine to sit and sit to supine  in bed with modified independence within 7 day(s).    2.  Patient will transfer from bed to chair and chair to bed with modified independence using the least restrictive device within 7 day(s).  3.  Patient will perform sit to stand with modified independence within 7 day(s).  4.  Patient will ambulate with modified independence for 100 feet with the least restrictive device within 7 day(s).   5.  Patient will ascend/descend 1 stairs with 0 handrail(s) with modified independence within 7 day(s).   physical Therapy TREATMENT  Patient: Jillian Bean (76 y.o. female)  Date: 11/30/2016  Diagnosis: FAILED TOTAL RIGHT KNEE REPLACEMENT  Failed total knee, right (HCC) <principal problem not specified>  Procedure(s) (LRB):   REMOVAL BOTH COMPONENTS RIGHT KNEE, REMOVAL INFECTED RIGHT KNEE, REVISION RIGHT TOTAL KNEE (Right) 4 Days Post-Op  Precautions: NWB  Chart, physical therapy assessment, plan of care and goals were reviewed.    ASSESSMENT:  Patient continues with an immobilizer and is NWB RLE.  She is cooperative and motivated.  Able to come to sitting with CGA for the RLE.  Ambulated 30 feet with a RW NWB and then into the bathroom. Ambulates with her own shoe on the left foot.  Toileted with supervision, stood to wash her hands at the sink, and then ambulated to the bedside chair.  RLE elevated on the stool and a pillow.  She is very steady with gait and has good UE strength.  To go to SNF rehab for IV antibiotics.   Progression toward goals:  []     Improving appropriately and progressing toward goals  [x]     Improving slowly and progressing toward goals  []     Not making progress toward goals and plan of care will be adjusted     PLAN:  Patient continues to benefit from skilled intervention to address the  above impairments.  Continue treatment per established plan of care.  Discharge Recommendations:  [place]  Further Equipment Recommendations for Discharge:  none     SUBJECTIVE:   Patient stated ???I am ready to get up, I won't be going today because of my fever.???    OBJECTIVE DATA SUMMARY:   Critical Behavior:  Neurologic State: Alert, Appropriate for age  Orientation Level: Oriented X4  Cognition: Appropriate decision making, Appropriate for age attention/concentration, Appropriate safety awareness, Follows commands  Safety/Judgement: Awareness of environment, Good awareness of safety precautions, Insight into deficits  Functional Mobility Training:  Bed Mobility:     Supine to Sit: Contact guard assistance;Assist x1;Additional time              Transfers:  Sit to Stand: Supervision;Assist x1  Stand to Sit: Supervision;Assist x1                             Balance:  Sitting: Intact  Standing: Intact;With support  Standing - Static: Constant support;Good  Standing - Dynamic : Good  Ambulation/Gait Training:  Distance (ft): 40 Feet (ft)  Assistive Device: Walker, rolling;Gait belt  Ambulation - Level of Assistance: Contact guard assistance;Assist x1           Right Side Weight Bearing: Non-weight bearing  Left Side Weight Bearing: Full  Therapeutic Exercises:   bilat ankle pumps  Pain:  Pain Scale 1: Numeric (0 - 10)  Pain Intensity 1: 0              Activity Tolerance:   Good.   Please refer to the flowsheet for vital signs taken during this treatment.  After treatment:   [x]     Patient left in no apparent distress sitting up in chair  []     Patient left in no apparent distress in bed  [x]     Call bell left within reach  [x]     Nursing notified  [x]     Caregiver present  []     Bed alarm activated    COMMUNICATION/COLLABORATION:   The patient???s plan of care was discussed with: Registered Nurse    Arman Filter, PT   Time Calculation: 24 mins

## 2016-11-30 NOTE — Progress Notes (Signed)
ID Progress Note  11/30/2016    Subjective:     Had significant fever overnight.. No dyspnea, abdominal pain, nausea headache or sore throat. She has a cough. No dysuria.     Objective:     Vitals:   Visit Vitals   ??? BP 113/61 (BP 1 Location: Left arm, BP Patient Position: At rest)   ??? Pulse 72   ??? Temp 98 ??F (36.7 ??C)   ??? Resp 16   ??? Ht '5\' 5"'  (1.651 m)   ??? Wt 63.5 kg (140 lb)   ??? LMP 04/13/2010   ??? SpO2 93%   ??? BMI 23.3 kg/m2        Tmax:  Temp (24hrs), Avg:100.1 ??F (37.8 ??C), Min:98 ??F (36.7 ??C), Max:101.5 ??F (38.6 ??C)      Exam:    Not in distress   Eyes: pink conjunctivae, anicteric sclerae   No cervical lymphadenopathy   Lungs: crackles at the bases   Heart: s1, s2, no murmurs, rubs or clicks    Abdomen: soft, nontender, no guarding or rebound  Extremities: right knee has no erythema extending from the incision        Labs:   Lab Results   Component Value Date/Time    WBC 5.6 11/28/2016 05:25 AM    Hemoglobin (POC) 8.2 (L) 04/12/2015 09:29 AM    HGB 9.7 (L) 11/28/2016 05:25 AM    Hematocrit (POC) 24 (L) 04/12/2015 09:29 AM    HCT 30.7 (L) 11/28/2016 05:25 AM    PLATELET 161 11/28/2016 05:25 AM    MCV 92.7 11/28/2016 05:25 AM     Lab Results   Component Value Date/Time    Sodium 141 11/30/2016 05:10 AM    Potassium 3.2 (L) 11/30/2016 05:10 AM    Chloride 108 11/30/2016 05:10 AM    CO2 27 11/30/2016 05:10 AM    Anion gap 6 11/30/2016 05:10 AM    Glucose 100 11/30/2016 05:10 AM    BUN 7 11/30/2016 05:10 AM    Creatinine 0.68 11/30/2016 05:10 AM    BUN/Creatinine ratio 10 (L) 11/30/2016 05:10 AM    GFR est AA >60 11/30/2016 05:10 AM    GFR est non-AA >60 11/30/2016 05:10 AM    Calcium 8.4 (L) 11/30/2016 05:10 AM    Bilirubin, total 0.4 11/28/2016 05:25 AM    AST (SGOT) 35 11/28/2016 05:25 AM    Alk. phosphatase 90 11/28/2016 05:25 AM    Protein, total 6.0 (L) 11/28/2016 05:25 AM    Albumin 2.4 (L) 11/28/2016 05:25 AM    Globulin 3.6 11/28/2016 05:25 AM    A-G Ratio 0.7 (L) 11/28/2016 05:25 AM     ALT (SGPT) 14 11/28/2016 05:25 AM           Assessment:       1.  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  2.  Iron deficiency anemia.  3.  Hypothyroidism.  4.  Bacteriuria  ??        Recommendations:     1.  She will need 6 weeks of IV antibiotics for the prosthetic knee infection.     2. She just had significant fever overnight. Get blood cultures and cxr today. If the fever doesn't recur and  Blood cultures are negative, she can be discharged on Sunday.     Bettyjean Stefanski Sallyanne Havers, MD

## 2016-11-30 NOTE — Progress Notes (Signed)
Bedside and Verbal shift change report given to [place], Therapist, sports (oncoming nurse) by Earna Coder, RN (offgoing nurse). Report included the following information SBAR, Kardex, Procedure Summary, Intake/Output, MAR, Accordion and Recent Results.

## 2016-11-30 NOTE — Progress Notes (Signed)
Complaints: None  Events: No acute orthopedic issues      GEN:  NAD. AOx3   ABD:  S/NT/ND   RLE:  Dressing C/D/I    5/5 motor    Calf nttp (Bilat)    Sensate all distribution to light touch    1+ dp/pt pulses, foot perfused      Lab Results   Component Value Date/Time    HGB 9.3 (L) 11/30/2016 10:32 AM    INR 1.0 11/21/2016 01:34 PM       Lab Results   Component Value Date/Time    Sodium 141 11/30/2016 05:10 AM    Potassium 3.2 (L) 11/30/2016 05:10 AM    Chloride 108 11/30/2016 05:10 AM    CO2 27 11/30/2016 05:10 AM    BUN 7 11/30/2016 05:10 AM    Creatinine 0.68 11/30/2016 05:10 AM    Calcium 8.4 (L) 11/30/2016 05:10 AM            POD #3 RIGHT TOTAL KNEE REVISION. Satisfactory progress. Patient's pain is well-controlled with current medications. No acute orthopedic issues noted. Infectious disease is monitoring patient and adjusting medications. She is awaiting rehabilitation facility. Discharge plan will be per Infectious Disease recommendations.    ABX: Infectious disease ordering and monitoring  PATHWAY: D/C Foley per protocol  DVT Prophylaxis: ASA  Weight Bearing: WBAT RLE   Pain Control: PRN oral narcotics, celebrex  Anticipated Discharge Date: Sunday-Monday  Disposition: SNF

## 2016-11-30 NOTE — Progress Notes (Signed)
Bedside shift change report given to Amber RN (oncoming nurse) by Holly RN (offgoing nurse). Report included the following information SBAR, Kardex, OR Summary, Intake/Output, MAR and Recent Results.

## 2016-12-01 MED FILL — ACETAMINOPHEN 500 MG TAB: 500 mg | ORAL | Qty: 2

## 2016-12-01 MED FILL — CEFAZOLIN 2 GM/50 ML IN DEXTROSE (ISO-OSMOTIC) IVPB: 2 gram/50 mL | INTRAVENOUS | Qty: 50

## 2016-12-01 MED FILL — ASPIRIN 325 MG TAB, DELAYED RELEASE: 325 mg | ORAL | Qty: 1

## 2016-12-01 MED FILL — FAMOTIDINE 20 MG TAB: 20 mg | ORAL | Qty: 1

## 2016-12-01 MED FILL — NORMAL SALINE FLUSH 0.9 % INJECTION SYRINGE: INTRAMUSCULAR | Qty: 10

## 2016-12-01 MED FILL — DOK PLUS 8.6 MG-50 MG TABLET: ORAL | Qty: 1

## 2016-12-01 MED FILL — POLYETHYLENE GLYCOL 3350 17 GRAM (100 %) ORAL POWDER PACKET: 17 gram | ORAL | Qty: 1

## 2016-12-01 MED FILL — SYNTHROID 112 MCG TABLET: 112 mcg | ORAL | Qty: 1

## 2016-12-01 MED FILL — DOK 100 MG CAPSULE: 100 mg | ORAL | Qty: 1

## 2016-12-01 NOTE — Progress Notes (Signed)
Bedside shift change report given to Kimrey, RN (oncoming nurse) by Jacquel Mccamish, RN (offgoing nurse). Report included the following information SBAR, Kardex and Recent Results.

## 2016-12-01 NOTE — Progress Notes (Cosign Needed)
Bedside and Verbal shift change report given to [employer], Therapist, sports (Soil scientist) by Jeani Hawking, Therapist, sports and Lovena Le, SN (offgoing nurse). Report included the following information SBAR, Kardex and MAR.

## 2016-12-01 NOTE — Progress Notes (Signed)
Chart reviewed, upon entering room patient reports she just returned to bed after sitting up since this morning. She states she feels comfortable with all dressing using AE and has been using the commode without difficulty. Patient again asking to rest at this time and asking to defer therapy for now. Will follow up as able and appropriate.     Vonzell Schlatter, OTR/L

## 2016-12-01 NOTE — Progress Notes (Signed)
Problem: Falls - Risk of  Goal: *Absence of Falls  Document Schmid Fall Risk and appropriate interventions in the flowsheet.   Outcome: Progressing Towards Goal  Fall Risk Interventions:  Mobility Interventions: PT Consult for mobility concerns         Medication Interventions: Patient to call before getting OOB    Elimination Interventions: Call light in reach

## 2016-12-01 NOTE — Progress Notes (Signed)
Complaints: None  Events: No acute orthopedic issues      GEN:  NAD. AOx3   RLE:  Dressing C/D/I    Calf nttp (Bilat)    NVI    Lab Results   Component Value Date/Time    HGB 9.3 (L) 11/30/2016 10:32 AM    INR 1.0 11/21/2016 01:34 PM       Lab Results   Component Value Date/Time    Sodium 141 11/30/2016 05:10 AM    Potassium 3.2 (L) 11/30/2016 05:10 AM    Chloride 108 11/30/2016 05:10 AM    CO2 27 11/30/2016 05:10 AM    BUN 7 11/30/2016 05:10 AM    Creatinine 0.68 11/30/2016 05:10 AM    Calcium 8.4 (L) 11/30/2016 05:10 AM            POD #4  RIGHT TOTAL KNEE REVISION.     DC to [facility] pending. Discharge plan will be per Infectious Disease recommendations.    ABX: Infectious disease ordering and monitoring  DVT Prophylaxis: ASA  Weight Bearing: NWB RLE   Pain Control: PRN oral narcotics, celebrex  Anticipated Discharge Date: Sunday-Monday  Disposition: SNF

## 2016-12-01 NOTE — Progress Notes (Signed)
Physical Therapy  4.21.18    Chart reviewed. Instruction to rehab tech, Monica Martinez, as to patient's plan of care and goals. Patient ambulated x 30 ft with constant rolling walker support and R knee immobilizer donned. Per observation, patient remained compliant to her non-weightbearing restrictions on the R LE. Patient required overall contact guard assistance for safety. Per observation, noted significant UE fatigue following 30 ft of ambulation. Following a seated rest break, Tech instructed patient on arm-rest push-ups in chair (completed x 6 repetitions). This activity's purpose was to increase patient's strength and endurance in order to progress her functional independence.      Thank you.  Gerri Lins, PT, DPT    Total time: 12 min (Tech), PT instruction/observation: 15 min

## 2016-12-01 NOTE — Other (Signed)
Bedside shift change report given to Cary (oncoming nurse) by Kimrey (offgoing nurse). Report included the following information SBAR.

## 2016-12-01 NOTE — Progress Notes (Signed)
Infectious Diseases Progress Note          Subjective:     She seems to feel well today. Pain is controlled. She deies SOB, CP, abd pain, N, V, D.    Objective:     Vitals:   Visit Vitals   ??? BP 130/78 (BP 1 Location: Left arm, BP Patient Position: At rest)   ??? Pulse 72   ??? Temp 98.4 ??F (36.9 ??C)   ??? Resp 16   ??? Ht 5\' 5"  (1.651 m)   ??? Wt 63.5 kg (140 lb)   ??? LMP 04/13/2010   ??? SpO2 99%   ??? BMI 23.3 kg/m2        Tmax:  Temp (24hrs), Avg:98.2 ??F (36.8 ??C), Min:98 ??F (36.7 ??C), Max:98.4 ??F (36.9 ??C)      Exam:  General appearance: alert, no distress  Lungs: clear to auscultation bilaterally  Heart: regular rate and rhythm  Abdomen: soft, non-tender. Bowel sounds normal. No masses,  no organomegaly  Skin: no rash  Neurologic: Grossly normal    IV Lines: peripheral    Labs:    Recent Labs      11/30/16   1032  11/30/16   0510   WBC  3.8   --    HGB  9.3*   --    PLT  169   --    BUN   --   7   CREA   --   0.68     Cultures right knee 4/16 = Staph epi (oxacillin sensitive)    BLOOD CULTURES:   4/20 = NGSF    Assessment:     1. 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 Fever on 4/19 -- resolved    3. Anemia    4. Hx of breast cancer    5. ??Hypothyroidism.      Plan:     1. Continue Ancef      Fredirick Maudlin., MD

## 2016-12-01 NOTE — Progress Notes (Signed)
Lab called; the patient's knee wound culture from 11/26/16 shows a rare staphylococcus epidermidis

## 2016-12-02 MED ORDER — COLESEVELAM 625 MG TAB
625 mg | Freq: Two times a day (BID) | ORAL | Status: DC
Start: 2016-12-02 — End: 2016-12-02

## 2016-12-02 MED ORDER — COLESEVELAM 625 MG TAB
625 mg | Freq: Two times a day (BID) | ORAL | Status: DC
Start: 2016-12-02 — End: 2016-12-04
  Administered 2016-12-02 – 2016-12-04 (×4): via ORAL

## 2016-12-02 MED FILL — NORMAL SALINE FLUSH 0.9 % INJECTION SYRINGE: INTRAMUSCULAR | Qty: 10

## 2016-12-02 MED FILL — CEFAZOLIN 2 GM/50 ML IN DEXTROSE (ISO-OSMOTIC) IVPB: 2 gram/50 mL | INTRAVENOUS | Qty: 50

## 2016-12-02 MED FILL — ACETAMINOPHEN 500 MG TAB: 500 mg | ORAL | Qty: 2

## 2016-12-02 MED FILL — ASPIRIN 325 MG TAB, DELAYED RELEASE: 325 mg | ORAL | Qty: 1

## 2016-12-02 MED FILL — SYNTHROID 112 MCG TABLET: 112 mcg | ORAL | Qty: 1

## 2016-12-02 MED FILL — DOK 100 MG CAPSULE: 100 mg | ORAL | Qty: 1

## 2016-12-02 MED FILL — DOK PLUS 8.6 MG-50 MG TABLET: ORAL | Qty: 1

## 2016-12-02 MED FILL — FAMOTIDINE 20 MG TAB: 20 mg | ORAL | Qty: 1

## 2016-12-02 MED FILL — WELCHOL 625 MG TABLET: 625 mg | ORAL | Qty: 1

## 2016-12-02 NOTE — Progress Notes (Signed)
Infectious Diseases Progress Note          Subjective:     She has had 2 loose BMs today with some mild abd cramping. Otherwise, she feels well    Objective:     Vitals:   Visit Vitals   ??? BP 133/55 (BP 1 Location: Left arm, BP Patient Position: At rest)   ??? Pulse 65   ??? Temp 98.3 ??F (36.8 ??C)   ??? Resp 16   ??? Ht 5\' 5"  (1.651 m)   ??? Wt 63.5 kg (140 lb)   ??? LMP 04/13/2010   ??? SpO2 95%   ??? BMI 23.3 kg/m2        Tmax:  Temp (24hrs), Avg:98.2 ??F (36.8 ??C), Min:97.8 ??F (36.6 ??C), Max:98.4 ??F (36.9 ??C)      Exam:  General appearance: alert, no distress  Lungs: clear to auscultation bilaterally  Heart: regular rate and rhythm  Abdomen: soft, nonditended, non-tender. Bowel sounds normal. No masses,  no organomegaly  Skin: no rash  Right knee: dressed  Neurologic: Grossly normal    IV Lines: peripheral    Labs:    Recent Labs      11/30/16   1032  11/30/16   0510   WBC  3.8   --    HGB  9.3*   --    PLT  169   --    BUN   --   7   CREA   --   0.68     Cultures right knee 4/16 = Staph epi (oxacillin sensitive)    BLOOD CULTURES:   4/20 = NGSF    Assessment:     1. 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  Fever on 4/19 -- resolved    3.  Anemia    4.  Hx of breast cancer    5. ??Hypothyroidism.      Plan:     1. Continue Ancef    2. If diarrhea continues or worsens, will check C diff. She will stop stool softeners first      Fredirick Maudlin., MD

## 2016-12-02 NOTE — Progress Notes (Signed)
Problem: Self Care Deficits Care Plan (Adult)  Goal: *Acute Goals and Plan of Care (Insert Text)  Occupational Therapy Goals  Initiated 11/29/2016  1.  Patient will perform lower body ADLs with modified independence using reacher/ dressing stick/ sock aide within 7 day(s).  2.  Patient will perform bathing with modified independence using long-handled sponge within 7 day(s).  3.  Patient will perform all aspects of toileting with modified independence within  7day(s).  4. Patient will perform toilet transfers at modified independence using Walkers, Type: Rolling Walker within 7 days.  5. Patient will tolerate greater than 5 minutes of standing without a rest break at modified independence using Walkers, Type: Rolling Walker during ADL's within 7 days.     Occupational Therapy TREATMENT  Patient: Jillian Bean (76 y.o. female)  Date: 12/02/2016  Diagnosis: FAILED TOTAL RIGHT KNEE REPLACEMENT  Failed total knee, right (HCC) <principal problem not specified>  Procedure(s) (LRB):   REMOVAL BOTH COMPONENTS RIGHT KNEE, REMOVAL INFECTED RIGHT KNEE, REVISION RIGHT TOTAL KNEE (Right) 6 Days Post-Op  Precautions: NWB  Chart, occupational therapy assessment, plan of care, and goals were reviewed.    ASSESSMENT:  Pt making steady progress with acute therapy. Pt alert and oriented x4, agreeable to therapy session. Pt performed bed mobility, LB dressing via compensatory technique with AE, and functional mobility in prep for ADLs with RW. Pt requires SBA to modA for transfers and LB dressing. Pt with good safety awareness and able to maintain NWB during all standing ADLs; one minor LOB with LB dressing in standing, however, pt able to recover independently. Reinforced NWB with all ADLs, importance of OOB daily for meals, and completing chair push-ups 2-3x/day to maximize UB strength for transfers/mobility. Pt anticipating to discharge tomorrow to rehab [facility]). Pt left in chair with all needs met, call bell in reach.  Minimal c/o pain.    Progression toward goals:  '[x]'        Improving appropriately and progressing toward goals  '[]'        Improving slowly and progressing toward goals  '[]'        Not making progress toward goals and plan of care will be adjusted     PLAN:  Patient continues to benefit from skilled intervention to address the above impairments.  Continue treatment per established plan of care.  Discharge Recommendations:  Rehab  Further Equipment Recommendations for Discharge:  TBD at rehab (recommend hip kit items), RW, BSC/raised toilet seat     SUBJECTIVE:   Patient stated ???I want to try to get on long pants today.???    OBJECTIVE DATA SUMMARY:   Cognitive/Behavioral Status:  Neurologic State: Alert;Appropriate for age  Orientation Level: Oriented X4  Cognition: Follows commands;Appropriate safety awareness;Appropriate for age attention/concentration;Appropriate decision making  Perception: Appears intact  Perseveration: No perseveration noted  Safety/Judgement: Awareness of environment;Fall prevention;Good awareness of safety precautions;Home safety;Insight into deficits    Functional Mobility and Transfers for ADLs:  Bed Mobility:  Supine to Sit: Stand-by assistance (gait belt for RLE)  Scooting: Stand-by assistance    Transfers:  Sit to Stand: Contact guard assistance     Bed to Chair: Contact guard assistance;Additional time;Adaptive equipment (RW)    Balance:  Sitting: Intact  Standing: Intact;With support (RW)  Standing - Static: Good  Standing - Dynamic : Good    ADL Intervention:  Lower Body Dressing Assistance  Underpants: Contact guard assistance  Pants With Elastic Waist: Minimum assistance (for long pants use AE)  Leg Crossed Method Used: No  Position Performed: Seated edge of bed;Standing  Cues: Verbal cues provided  Adaptive Equipment Used: Dressing stick;Reacher;Walker         Cognitive Retraining  Safety/Judgement: Awareness of environment;Fall prevention;Good awareness  of safety precautions;Home safety;Insight into deficits     Activity Tolerance:   VSS    Please refer to the flowsheet for vital signs taken during this treatment.  After treatment:   '[x]'  Patient left in no apparent distress sitting up in chair  '[]'  Patient left in no apparent distress in bed  '[x]'  Call bell left within reach  '[x]'  Nursing notified  '[]'  Caregiver present  '[]'  Bed alarm activated    COMMUNICATION/COLLABORATION:   The patient???s plan of care was discussed with: Physical Therapist and Registered Nurse    Vilma Prader, OT  Time Calculation: 16 mins

## 2016-12-02 NOTE — Progress Notes (Signed)
Bedside and Verbal shift change report given to Zach, RN (oncoming nurse) by Cary, RN (offgoing nurse). Report included the following information SBAR, Kardex and MAR.

## 2016-12-02 NOTE — Progress Notes (Signed)
Bedside shift change report given to Cary, RN (oncoming nurse) by Larene Ascencio, RN (offgoing nurse). Report included the following information SBAR, Kardex and Recent Results.

## 2016-12-02 NOTE — Progress Notes (Signed)
Problem: Mobility Impaired (Adult and Pediatric)  Goal: *Acute Goals and Plan of Care (Insert Text)  Physical Therapy Goals  Initiated 11/27/2016  1.  Patient will move from supine to sit and sit to supine  in bed with modified independence within 7 day(s).    2.  Patient will transfer from bed to chair and chair to bed with modified independence using the least restrictive device within 7 day(s).  3.  Patient will perform sit to stand with modified independence within 7 day(s).  4.  Patient will ambulate with modified independence for 100 feet with the least restrictive device within 7 day(s).   5.  Patient will ascend/descend 1 stairs with 0 handrail(s) with modified independence within 7 day(s).   physical Therapy TREATMENT  Patient: Jillian Bean (76 y.o. female)  Date: 12/02/2016  Diagnosis: FAILED TOTAL RIGHT KNEE REPLACEMENT  Failed total knee, right (HCC) <principal problem not specified>  Procedure(s) (LRB):   REMOVAL BOTH COMPONENTS RIGHT KNEE, REMOVAL INFECTED RIGHT KNEE, REVISION RIGHT TOTAL KNEE (Right) 6 Days Post-Op  Precautions: NWB  Chart, physical therapy assessment, plan of care and goals were reviewed.    ASSESSMENT:  Pt seen this afternoon for skilled PT participation. Pt agreeable to bed mobility, transfers, toileting, and gait training with assist x1 at [place].  Pt compliant with R NWB status during ambulation though fatigues and demonstrates decreased stability with LLE clearance resulting in safety concerns.  Pt plans to D/C to [place] x5 weeks for antibiotics and this remains appropriate.  Pt back to bed with therex in NAD when left.  Pt using opposite LE and gait belt for RLE assist into/OOB.    Progression toward goals:  []     Improving appropriately and progressing toward goals  [x]     Improving slowly and progressing toward goals  []     Not making progress toward goals and plan of care will be adjusted     PLAN:  Patient continues to benefit from skilled intervention to address the  above impairments.  Continue treatment per established plan of care.  Discharge Recommendations:  Rehab  Further Equipment Recommendations for Discharge:  NA     SUBJECTIVE:   Patient stated ???Well, I have to go to rehab for 5 weeks.Marland KitchenMarland Kitchen???    OBJECTIVE DATA SUMMARY:   Critical Behavior:  Neurologic State: Alert, Appropriate for age  Orientation Level: Oriented X4  Cognition: Follows commands, Appropriate safety awareness, Appropriate for age attention/concentration, Appropriate decision making  Safety/Judgement: Awareness of environment, Fall prevention, Good awareness of safety precautions, Home safety, Insight into deficits  Functional Mobility Training:  Bed Mobility:     Supine to Sit: Supervision;Additional time (gait belt assist for RLE)  Sit to Supine: Supervision;Additional time (opposite LE assist for lift onto EOB)  Scooting: Supervision        Transfers:  Sit to Stand: Contact guard assistance (pt pulling from RW and able to identify that this was incorr)  Stand to Sit: Contact guard assistance        Bed to Chair:  (NT)                    Balance:  Sitting: Intact  Standing: Intact;With support  Standing - Static: Fair  Standing - Dynamic : Fair  Ambulation/Gait Training:  Distance (ft): 40 Feet (ft) (x2)  Assistive Device: Gait belt;Walker, rolling  Ambulation - Level of Assistance: Contact guard assistance        Gait Abnormalities: Antalgic;Decreased step clearance (pt  compliant with RLE NWB; hip flex p! noted)  Right Side Weight Bearing: Non-weight bearing     Base of Support: Shift to left     Speed/Cadence: Slow;Pace decreased (<100 feet/min)  Step Length: Left shortened        Interventions: Verbal cues    Therapeutic Exercises:   Glut sets, quad sets, APs, and hip abd (with gait belt assist/clean trash bag for decreased resistance attempted) x10 reps TID encouraged  Pain:  Pain Scale 1: Visual  Pain Intensity 1: 0              Activity Tolerance:   NAD  After treatment:    []     Patient left in no apparent distress sitting up in chair  [x]     Patient left in no apparent distress in bed  [x]     Call bell left within reach  [x]     Nursing notified  []     Caregiver present  []     Bed alarm activated    COMMUNICATION/COLLABORATION:   The patient???s plan of care was discussed with: Registered Nurse    Spero Geralds   Time Calculation: 16 mins

## 2016-12-02 NOTE — Progress Notes (Signed)
Complaints: None  Events: No acute orthopedic issues      GEN:  NAD. AOx3   RLE:  Dressing C/D/I    Calf nttp (Bilat)    NVI         POD #5  RIGHT TOTAL KNEE REVISION.     DC to [facility] pending. Discharge plan will be per Infectious Disease recommendations.    ABX: Infectious disease ordering and monitoring  DVT Prophylaxis: ASA  Weight Bearing: NWB RLE   Pain Control: PRN oral narcotics, celebrex  Anticipated Discharge Date: Monday  Disposition: SNF

## 2016-12-03 ENCOUNTER — Inpatient Hospital Stay: Admit: 2016-12-03 | Payer: MEDICARE | Primary: Internal Medicine

## 2016-12-03 MED ORDER — LIDOCAINE HCL 2 % (20 MG/ML) IJ SOLN
20 mg/mL (2 %) | Freq: Once | INTRAMUSCULAR | Status: AC
Start: 2016-12-03 — End: 2016-12-03

## 2016-12-03 MED ORDER — SODIUM CHLORIDE 0.9 % IJ SYRG
INTRAMUSCULAR | Status: DC | PRN
Start: 2016-12-03 — End: 2016-12-04

## 2016-12-03 MED ORDER — SODIUM CHLORIDE 0.9 % IJ SYRG
Freq: Three times a day (TID) | INTRAMUSCULAR | Status: DC
Start: 2016-12-03 — End: 2016-12-04
  Administered 2016-12-03 – 2016-12-04 (×3)

## 2016-12-03 MED ORDER — TRAMADOL 50 MG TAB
50 mg | ORAL_TABLET | Freq: Four times a day (QID) | ORAL | 0 refills | Status: DC | PRN
Start: 2016-12-03 — End: 2017-01-18

## 2016-12-03 MED ORDER — WATER FOR INJECTION, STERILE INJECTION
2 mg | INTRAMUSCULAR | Status: DC | PRN
Start: 2016-12-03 — End: 2016-12-04

## 2016-12-03 MED ORDER — LIDOCAINE HCL 2 % (20 MG/ML) IJ SOLN
20 mg/mL (2 %) | INTRAMUSCULAR | Status: AC
Start: 2016-12-03 — End: 2016-12-03
  Administered 2016-12-03: 19:00:00 via SUBCUTANEOUS

## 2016-12-03 MED ORDER — SODIUM CHLORIDE 0.9 % IJ SYRG
INTRAMUSCULAR | Status: DC
Start: 2016-12-03 — End: 2016-12-04
  Administered 2016-12-03: 19:00:00

## 2016-12-03 MED ORDER — ASPIRIN 325 MG TAB, DELAYED RELEASE
325 mg | ORAL_TABLET | Freq: Two times a day (BID) | ORAL | 0 refills | Status: DC
Start: 2016-12-03 — End: 2017-01-18

## 2016-12-03 MED ORDER — SODIUM CHLORIDE 0.9 % IJ SYRG
Freq: Three times a day (TID) | INTRAMUSCULAR | Status: DC
Start: 2016-12-03 — End: 2016-12-04
  Administered 2016-12-03 – 2016-12-04 (×3)

## 2016-12-03 MED ORDER — SODIUM CHLORIDE 0.9 % IJ SYRG
INTRAMUSCULAR | Status: DC
Start: 2016-12-03 — End: 2016-12-04
  Administered 2016-12-03: 22:00:00

## 2016-12-03 MED ORDER — LIDOCAINE HCL 2 % (20 MG/ML) IJ SOLN
20 mg/mL (2 %) | INTRAMUSCULAR | Status: AC
Start: 2016-12-03 — End: 2016-12-03
  Administered 2016-12-03: 20:00:00 via SUBCUTANEOUS

## 2016-12-03 MED FILL — NORMAL SALINE FLUSH 0.9 % INJECTION SYRINGE: INTRAMUSCULAR | Qty: 10

## 2016-12-03 MED FILL — XYLOCAINE 20 MG/ML (2 %) INJECTION SOLUTION: 20 mg/mL (2 %) | INTRAMUSCULAR | Qty: 10

## 2016-12-03 MED FILL — FAMOTIDINE 20 MG TAB: 20 mg | ORAL | Qty: 1

## 2016-12-03 MED FILL — CEFAZOLIN 2 GM/50 ML IN DEXTROSE (ISO-OSMOTIC) IVPB: 2 gram/50 mL | INTRAVENOUS | Qty: 50

## 2016-12-03 MED FILL — ACETAMINOPHEN 500 MG TAB: 500 mg | ORAL | Qty: 2

## 2016-12-03 MED FILL — ASPIRIN 325 MG TAB, DELAYED RELEASE: 325 mg | ORAL | Qty: 1

## 2016-12-03 MED FILL — SYNTHROID 112 MCG TABLET: 112 mcg | ORAL | Qty: 1

## 2016-12-03 MED FILL — DOK PLUS 8.6 MG-50 MG TABLET: ORAL | Qty: 1

## 2016-12-03 MED FILL — CATHFLO ACTIVASE 2 MG INTRA-CATHETER SOLUTION: 2 mg | Qty: 1

## 2016-12-03 MED FILL — WELCHOL 625 MG TABLET: 625 mg | ORAL | Qty: 1

## 2016-12-03 MED FILL — NORMAL SALINE FLUSH 0.9 % INJECTION SYRINGE: INTRAMUSCULAR | Qty: 80

## 2016-12-03 MED FILL — DOK 100 MG CAPSULE: 100 mg | ORAL | Qty: 1

## 2016-12-03 MED FILL — POLYETHYLENE GLYCOL 3350 17 GRAM (100 %) ORAL POWDER PACKET: 17 gram | ORAL | Qty: 1

## 2016-12-03 NOTE — Procedures (Signed)
 PICC Placement Note    PRE-PROCEDURE VERIFICATION  Correct Procedure: yes  Correct Site:  yes  Temperature: Temp: 97.8 F (36.6 C), Temperature Source: Temp Source: Oral  No results for input(s): BUN, CREA, PLT, INR, WBC, PLTEXT in the last 72 hours.    No lab exists for component: APTHR, INREXT  Allergies: Atorvastatin; Zocor [simvastatin]; Celebrex [celecoxib]; Gabapentin; Levaquin [levofloxacin]; Nexium [esomeprazole magnesium]; Other medication; Oxycontin [oxycodone]; Pravastatin; Statins-hmg-coa reductase inhibitors; and Yellow dye  Education materials, including PICC Booklet, for PICC Care given to patient: yes.   See Patient Education activity for further details.    PROCEDURE DETAIL  A single lumen PICC line was attempted for Home IV Therapy. The following documentation is in addition to the PICC properties in the lines/airways flowsheet :  Was xylocaine 1% used intradermally:  yes  Catheter Length: 41 (cm)  Vein Selection for PICC:left basilic  Central Line Bundle followed yes  Complication Related to Insertion: inability to advance catheter. Able to access left basilic without difficulty. Catheter unable to pass the 15 cm mark on line; possible catheter coiling. After multiple attempts of manipulation still unable to pass catheter. Patient has history of right lumpectomy and lymphectomy; patient had previous left sided port. Spoke with IR who will attempt; Tamea MD aware. Catheter removed and covered with occlusive dressing.    Report given to nurse [facility] RN.     Earnie DELENA Amour, RN

## 2016-12-03 NOTE — Progress Notes (Signed)
Bedside and Verbal shift change report given to Donia Guiles, Therapist, sports (oncoming nurse) by Earna Coder, RN (offgoing nurse). Report included the following information SBAR, Kardex, Procedure Summary, Intake/Output, MAR, Accordion and Recent Results.

## 2016-12-03 NOTE — Procedures (Signed)
PICC Placement Note    PRE-PROCEDURE VERIFICATION  Correct Procedure: yes  Correct Site:  yes  Temperature: Temp: 97.8 ??F (36.6 ??C), Temperature Source: Temp Source: Oral  No results for input(s): BUN, CREA, PLT, INR, WBC, PLTEXT in the last 72 hours.    No lab exists for component: APTHR, INREXT  Allergies: Atorvastatin; Zocor [simvastatin]; Celebrex [celecoxib]; Gabapentin; Levaquin [levofloxacin]; Nexium [esomeprazole magnesium]; Other medication; Oxycontin [oxycodone]; Pravastatin; Statins-hmg-coa reductase inhibitors; and Yellow dye  Education materials, including PICC Booklet, for PICC Care given to patient: yes.   See Patient Education activity for further details.    PROCEDURE DETAIL  A single lumen PICC line was attempted for Home IV Therapy. The following documentation is in addition to the PICC properties in the lines/airways flowsheet :  Was xylocaine 1% used intradermally:  yes  Catheter Length: 41 (cm)  Vein Selection for PICC:left basilic  Central Line Bundle followed yes  Complication Related to Insertion: inability to advance catheter. Able to access left basilic without difficulty. Catheter unable to pass the 15 cm mark on line; possible catheter coiling. After multiple attempts of manipulation still unable to pass catheter. Patient has history of right lumpectomy and lymphectomy; patient had previous left sided port. Spoke with IR who will attempt; Patsey Berthold MD aware. Catheter removed and covered with occlusive dressing.    Report given to nurse [facility] RN.     Apolinar Junes, RN

## 2016-12-03 NOTE — Progress Notes (Signed)
Order for PICC verified. Consent obtained after risks, benefits, and procedure was explained to patient. Time given to answer questions and/or concerns.    Apolinar Junes, RN  Vascular Access Team

## 2016-12-03 NOTE — Progress Notes (Signed)
Complaints: none  Events: none      GEN:  NAD. AOx3   ABD:  S/NT/ND   RLE:  Dressing C/D/I    5/5 motor    Calf nttp (Bilat)    Sensate all distribution to light touch    1+ dp/pt pulses, foot perfused      Lab Results   Component Value Date/Time    HGB 9.3 (L) 11/30/2016 10:32 AM    INR 1.0 11/21/2016 01:34 PM       Lab Results   Component Value Date/Time    Sodium 141 11/30/2016 05:10 AM    Potassium 3.2 (L) 11/30/2016 05:10 AM    Chloride 108 11/30/2016 05:10 AM    CO2 27 11/30/2016 05:10 AM    BUN 7 11/30/2016 05:10 AM    Creatinine 0.68 11/30/2016 05:10 AM    Calcium 8.4 (L) 11/30/2016 05:10 AM            POD #6 RIGHT TOTAL KNEE REVISION. Satisfactory progress. PICC line ordered today. Patient is doing well, her pain is minimal. She is looking forward to discharge. Continue nonweightbearing and in knee immobilizer. Infectious disease to monitor infection.    ABX: Continue per Infectious Disease recommendations - 6 weeks IV antibiotics  PATHWAY: D/C Foley per protocol  DVT Prophylaxis: ASA  Weight Bearing: Non-weightbearing, knee immobilizer  Pain Control: Tylenol  Anticipated Discharge Date: Today, awaiting bed at [facility]  Disposition: SNF

## 2016-12-03 NOTE — Progress Notes (Addendum)
Chart reviewed. Noted discharge orders. Plan is for patient to discharge to [facility] with IV abx. Spoke with Marzetta Board at [facility], confirmed they have a bed today. PICC line needs to be placed prior to discharge. PICC placement time is pending. CM met with patient and spouse at bedside. Patient would like to transport via stretcher. Transport time is set for 4 PM with [facility]. Rn is aware.    1:45 PM: Spoke with RN, transport needs to be pushed back as patient still needs PICC placed. CM spoke with Loraine with [facility], transport has been changed to 5:30 PM.  Marzetta Board at [facility] is aware.    Current discharge plan: Patient will discharge to [facility] today. [facility] will transport patient via stretcher at 5:30 PM. Discharge folder located on hard chart to include ambulance form and discharge instructions. RN to follow with Kardex and MAR. RN to call report to [phone] and ask for wing 2.    Update 3:45 PM: Spoke with Donia Guiles, RN. Patient had to go down to IR for PICC placement. There is a delay for PICC being placed due to an emergency in IR for another patient.  Will plan for discharge tomorrow as the facility cannot accept patient late in the evening. Stacey at [facility] has been notified. RN will notify MD. Transport has been placed on will call for tomorrow with [facility].    Regenia Skeeter, BSW/CRM

## 2016-12-03 NOTE — Progress Notes (Signed)
8:34 PM  Bedside and Verbal shift change report given to [place], Therapist, sports (Soil scientist) by Donia Guiles, Printmaker). Report included the following information SBAR, Kardex, OR Summary, Procedure Summary, Intake/Output, MAR and Recent Results.

## 2016-12-03 NOTE — Progress Notes (Signed)
2:21 PM  Hospital to SNF SBAR Handoff - Jillian Bean                                                                        76 y.o.   female    [place]   Room: 565/01    [facility] 5S ORTHO JOINT  Unit Phone# :  [phone]      [address] 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[employer])       Principal Problem: <principal problem not specified>    7 Days Post-Op of   Procedure(s):   REMOVAL BOTH COMPONENTS RIGHT KNEE, REMOVAL INFECTED RIGHT KNEE, REVISION RIGHT TOTAL KNEE   BY: Zollie Beckers, MD             ON: 11/26/2016                  Code Status: Full Code                Advance Directives:   Advance Care Planning 11/27/2016   Patient's Healthcare Decision Maker is: Legal Next of [place]   Primary Decision Maker Name Santo Held    Primary Decision Maker Phone Number [phone]   Primary Decision Maker Relationship to Patient Spouse   Confirm Advance Directive Yes, on file   Does the patient have other document types -    (Send w/patient)   Yes Not W Pt       Isolation:  There are currently no Active Isolations       MDRO: No current active infections    Pain Medications given:  Tramadol 50 mg   Last dose: 11/28/16 at        Special Equipment needed: no  Type of equipment:      (Not currently on dialysis)  (Not currently on dialysis)  (Not currently on dialysis)     BACKGROUND     Allergies:  Allergies   Allergen Reactions    ??? Atorvastatin Myalgia   ??? Zocor [Simvastatin] Other (comments) and Diarrhea     Other reaction(s): Adverse reaction to substance   Myalgias     ??? Celebrex [Celecoxib] Other (comments)     Aggitation   ??? Gabapentin Other (comments)     FELT UNSTEADY ON MY FEET   ??? Levaquin [Levofloxacin] Palpitations   ??? Nexium [Esomeprazole Magnesium] Diarrhea   ??? Other Medication Swelling     Latanoprost drops   ???  Oxycontin [Oxycodone] Itching   ??? Pravastatin Nausea and Vomiting   ??? Statins-Hmg-Coa Reductase Inhibitors Other (comments)     lymphacytic colitis     ??? Yellow Dye Hives       Past Medical History:   Diagnosis Date   ??? Anemia    ??? Back pain 09/01/2007   ??? Cancer (HCC)     R breast   ??? Chronic pain     BACK PAIN   ??? Coagulation disorder ([place])     IRON DEFFICIENCY ANEMIA   ??? Colonic polyps 08/31/1998   ??? 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 Hiatal hernia 07/11/2011    Dr.sobieski   ??? Hypercholesteremia 09/01/2007   ??? Hypothyroidism 09/01/2003   ??? 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 Nausea & vomiting    ??? OA (osteoarthritis) 09/01/2007   ??? Other ill-defined conditions(799.89)     VERTIGO   ??? Plantar fasciitis 09/01/2007   ??? S/P colonoscopy 10/10/2007   ??? Thyroid disease        Past Surgical History:   Procedure Laterality Date   ??? BREAST SURGERY PROCEDURE UNLISTED  1993    Breast Reduction   ??? COLONOSCOPY N/A 11/12/2016    COLONOSCOPY performed by Burnett Harry, MD at [facility]   ??? COLONOSCOPY,DIAGNOSTIC  11/12/2016        ??? ENDOSCOPY, COLON, DIAGNOSTIC  7/12/18/08    7/05 Dr Maudry Diego   ??? HX BREAST LUMPECTOMY Right 2016   ??? HX COLONOSCOPY  2014    Dr Illene Silver   ??? HX GI      COLONOSCOPY   ??? HX GYN      Hysterectomy in her 80's   ??? HX HEENT  04/07/10    thyroid biopsy neg dr Leward Quan   ??? HX HEENT      wisdom teeth extraction   ??? HX ORTHOPAEDIC  08/22/09    left TKR   ??? HX ORTHOPAEDIC  08/13/12    right tkr    ??? HX OTHER SURGICAL  2013    MINIMALLY INVASIVE LUMBAR DECOMPRESSION   ??? UPPER GI ENDOSCOPY,BIOPSY  11/12/2016            Prescriptions Prior to Admission   Medication Sig   ??? brimonidine-timolol (COMBIGAN) 0.2-0.5 % drop ophthalmic solution Administer 1 Drop to both eyes every twelve (12) hours.   ??? raNITIdine (ZANTAC) 150 mg tablet Take 1 Tab by mouth two (2) times a day.   ??? alendronate (FOSAMAX) 70 mg tablet Take 1 Tab by mouth every seven (7) days.   ??? colesevelam (WELCHOL) 625 mg tablet 1 po bid   ??? levothyroxine (SYNTHROID) 112 mcg tablet 8 per week   ??? acetaminophen (TYLENOL) 500 mg tablet Take  by mouth every six (6) hours as needed for Pain.   ??? docusate sodium (COLACE) 100 mg capsule Take 100 mg by mouth daily.   ??? OTHER    ??? diclofenac EC (VOLTAREN) 50 mg EC tablet Take 1 Tab by mouth two (2) times daily as needed.   ??? aspirin 81 mg chewable tablet Take 81 mg by mouth daily.   ??? multivitamin (ONE A DAY) tablet Take 1 Tab by mouth daily.       Hard scripts included in transfer packet yes    Vaccinations:    Immunization History   Administered Date(s) Administered   ??? Influenza  High Dose Vaccine PF 06/21/2015, 06/01/2016   ??? Influenza Vaccine 05/06/2013, 06/08/2013   ??? Pneumococcal Conjugate (PCV-13) 07/19/2015   ??? TD Vaccine 08/13/2005   ??? Tdap 02/10/2014   ??? ZZZ-RETIRED (DO NOT USE) Pneumococcal Vaccine (Unspecified Type) 11/27/2005   ??? Zoster Vaccine, Live 06/08/2013       Readmission Risks:    Known Risks: Age, impaired mobility        The Charlson CoMorbitiy Index tool is an evidenced based tool that has more automatic generated information. The tool looks at many different items such as the age of the patient, how many times they were admitted in the last calendar year, current length of stay in the hospital and their diagnosis. All of these items are pulled automatically from information documented in the chart from various places and will generate a score that  predicts whether a patient is at low (less than 13), medium (13-20) or high (21 or greater) risk of being readmitted.        ASSESSMENT                Temp: 97.8 ??F (36.6 ??C) (12/03/16 0905) Pulse (Heart Rate): 80 (12/03/16 0905)     Resp Rate: 16 (12/03/16 0905)           BP: 150/69 (12/03/16 0905)     O2 Sat (%): 96 % (12/03/16 0905)     Weight: 63.5 kg (140 lb)    Height: 5\' 5"  (165.1 cm) (11/27/16 0122)       If above not within 1 hour of discharge:    BP:_____  P:____  R:____ T:_____ O2 Sat: ___%  O2: ______    Active Orders   Diet    DIET REGULAR         Orientation: oriented to time, place, person and situation     Active Behaviors: None                                   Active Lines/Drains:  (Peg Tube / Foley / CL or S/L?): yes    Urinary Status: Voiding     Last BM: Last Bowel Movement Date: 12/03/16     Skin Integrity: Incision (comment)   Wound Knee Right-DRESSING STATUS: Clean, dry, and intact    Wound Knee Right-DRESSING TYPE: Silver products (aquacel)    Mobility: Slightly limited   Weight Bearing Status: NWB (Non Weight Bearing)      Gait Training  Assistive Device: Gait belt, Walker, rolling  Ambulation - Level of Assistance: Stand-by assistance  Distance (ft): 30 Feet (ft) (x2)  Interventions: Safety awareness training         Lab Results   Component Value Date/Time    Glucose 100 11/30/2016 05:10 AM    Hemoglobin A1c 4.7 11/21/2016 01:34 PM    INR 1.0 11/21/2016 01:34 PM    INR 1.5 (H) 09/05/2012 03:53 AM    HGB 9.3 (L) 11/30/2016 10:32 AM    HGB 9.7 (L) 11/28/2016 05:25 AM        RECOMMENDATION     See After Visit Summary (AVS) for:  ?? Discharge instructions  ?? After [place]   ?? Special equipment needed (entered pre-discharge by Care Management)  ?? Medication Reconciliation    ?? Follow up Appointment(s)         Report given/sent by:  Georgina Snell  Verbal report given to: Darrick Penna, RN  FAXED to:  See CM notes         Estimated discharge time:  12/03/2016 at 1730

## 2016-12-03 NOTE — Progress Notes (Signed)
ID Progress Note  12/03/2016    Subjective:     Afebrile over the weekend. No dyspnea. Only had 1 soft BM.     Objective:     Vitals:   Visit Vitals   ??? BP 150/69 (BP 1 Location: Left arm, BP Patient Position: Post activity)   ??? Pulse 80   ??? Temp 97.8 ??F (36.6 ??C)   ??? Resp 16   ??? Ht '5\' 5"'  (1.651 m)   ??? Wt 63.5 kg (140 lb)   ??? LMP 04/13/2010   ??? SpO2 96%   ??? BMI 23.3 kg/m2        Tmax:  Temp (24hrs), Avg:98.1 ??F (36.7 ??C), Min:97.8 ??F (36.6 ??C), Max:98.4 ??F (36.9 ??C)      Exam:    Not in distress   Lungs: crackles at the bases   Heart: s1, s2, no murmurs, rubs or clicks    Abdomen: soft, nontender, no guarding or rebound         Labs:   Lab Results   Component Value Date/Time    WBC 3.8 11/30/2016 10:32 AM    Hemoglobin (POC) 8.2 (L) 04/12/2015 09:29 AM    HGB 9.3 (L) 11/30/2016 10:32 AM    Hematocrit (POC) 24 (L) 04/12/2015 09:29 AM    HCT 30.0 (L) 11/30/2016 10:32 AM    PLATELET 169 11/30/2016 10:32 AM    MCV 92.0 11/30/2016 10:32 AM     Lab Results   Component Value Date/Time    Sodium 141 11/30/2016 05:10 AM    Potassium 3.2 (L) 11/30/2016 05:10 AM    Chloride 108 11/30/2016 05:10 AM    CO2 27 11/30/2016 05:10 AM    Anion gap 6 11/30/2016 05:10 AM    Glucose 100 11/30/2016 05:10 AM    BUN 7 11/30/2016 05:10 AM    Creatinine 0.68 11/30/2016 05:10 AM    BUN/Creatinine ratio 10 (L) 11/30/2016 05:10 AM    GFR est AA >60 11/30/2016 05:10 AM    GFR est non-AA >60 11/30/2016 05:10 AM    Calcium 8.4 (L) 11/30/2016 05:10 AM    Bilirubin, total 0.4 11/28/2016 05:25 AM    AST (SGOT) 35 11/28/2016 05:25 AM    Alk. phosphatase 90 11/28/2016 05:25 AM    Protein, total 6.0 (L) 11/28/2016 05:25 AM    Albumin 2.4 (L) 11/28/2016 05:25 AM    Globulin 3.6 11/28/2016 05:25 AM    A-G Ratio 0.7 (L) 11/28/2016 05:25 AM    ALT (SGPT) 14 11/28/2016 05:25 AM           Assessment:       1.  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  2.  Iron deficiency anemia.  3.  Hypothyroidism.  4.  Bacteriuria  ??         Recommendations:     1.  She will need 6 weeks of IV antibiotics for the prosthetic knee infection. I will order PICC. Orders written.       Iverson Sees Jillian Havers, MD

## 2016-12-03 NOTE — Progress Notes (Signed)
Problem: Mobility Impaired (Adult and Pediatric)  Goal: *Acute Goals and Plan of Care (Insert Text)  Physical Therapy Goals  Initiated 11/27/2016  1.  Patient will move from supine to sit and sit to supine  in bed with modified independence within 7 day(s).    2.  Patient will transfer from bed to chair and chair to bed with modified independence using the least restrictive device within 7 day(s).  3.  Patient will perform sit to stand with modified independence within 7 day(s).  4.  Patient will ambulate with modified independence for 100 feet with the least restrictive device within 7 day(s).   5.  Patient will ascend/descend 1 stairs with 0 handrail(s) with modified independence within 7 day(s).   physical Therapy TREATMENT  Patient: Jillian Bean (76 y.o. female)  Date: 12/03/2016  Diagnosis: FAILED TOTAL RIGHT KNEE REPLACEMENT  Failed total knee, right (HCC) <principal problem not specified>  Procedure(s) (LRB):   REMOVAL BOTH COMPONENTS RIGHT KNEE, REMOVAL INFECTED RIGHT KNEE, REVISION RIGHT TOTAL KNEE (Right) 7 Days Post-Op  Precautions: NWB  Chart, physical therapy assessment, plan of care and goals were reviewed.    ASSESSMENT:  Pt seen following RN clearance this morning.  Pt agreeable to bed mobility, transfers, gait, and bed therex with assist x1.  Pt likely to leave for rehab in near future 2* requiring antibiotics.  In event of delayed D/C, recommend PT clear pt on 1 step per home needs and then D/C acute PT services as she is ambulating well with RLE NWB compliance and completing her bed mobility modI.  Pt's husband (?) present and support this morning.  Pt left in NAD with all needs met.  Progression toward goals:  _0     Improving appropriately and progressing toward goals  _1     Improving slowly and progressing toward goals  _2     Not making progress toward goals and plan of care will be adjusted     PLAN:  Patient continues to benefit from skilled intervention to address the  above impairments.  Continue treatment per established plan of care.  Discharge Recommendations:  [place]  Further Equipment Recommendations for Discharge:  NA     SUBJECTIVE:   Patient stated ???I think I'll go back to bed, I sat up this morning at 5'oclock.Marland KitchenMarland Kitchen???    OBJECTIVE DATA SUMMARY:   Critical Behavior:  Neurologic State: Alert, Appropriate for age  Orientation Level: Oriented X4  Cognition: Follows commands  Safety/Judgement: Awareness of environment, Fall prevention, Good awareness of safety precautions, Home safety, Insight into deficits  Functional Mobility Training:  Bed Mobility:     Supine to Sit: Modified independent  Sit to Supine: Modified independent  Scooting: Modified independent        Transfers:  Sit to Stand: Minimum assistance (pt requesting PT stabilize RW)  Stand to Sit: Supervision        Bed to Chair:  (NT)                    Balance:  Sitting: Intact  Standing: Intact;With support  Standing - Static: Fair  Standing - Dynamic : Fair  Ambulation/Gait Training:  Distance (ft): 30 Feet (ft) (x2)  Assistive Device: Gait belt;Walker, rolling  Ambulation - Level of Assistance: Stand-by assistance        Gait Abnormalities: Decreased step clearance (pt compliant with RLE NWB at RW)  Right Side Weight Bearing: Non-weight bearing     Base of Support: Shift to  left     Speed/Cadence: Slow;Pace decreased (<100 feet/min)  Step Length: Left shortened        Interventions: Safety awareness training    Therapeutic Exercises:   Quad sets, glut sets, APs x10 reps  Pain:  Pain Scale 1: Numeric (0 - 10)  Pain Intensity 1: 0              Activity Tolerance:   NAD  After treatment:   _0     Patient left in no apparent distress sitting up in chair  _1     Patient left in no apparent distress in bed  _2     Call bell left within reach  _3     Nursing notified  _4     Caregiver present  _5     Bed alarm activated    COMMUNICATION/COLLABORATION:    The patient???s plan of care was discussed with: Registered Nurse    Spero Geralds   Time Calculation: 15 mins

## 2016-12-03 NOTE — Progress Notes (Addendum)
1450: pt to angio holding for picc line placement  1505: procedure start  1515: MD unable to cannulate L arm for picc. MD suggests doing picc line in r arm. Pt is agreeable to this. Emergency in angio requiring MD to do other procedure, but will re attempt after. Jillian Bean, primary RN notified.  1625: procedure start  1628: procedure complete. Pt tolerated well. See LDAs.  1640: TRANSFER - OUT REPORT:    Verbal report given to Ginny RN on [employer]  being transferred to 5S for routine progression of care       Report consisted of patient???s Situation, Background, Assessment and   Recommendations(SBAR).     Information from the following report(s) SBAR and Procedure Summary was reviewed with the receiving nurse.    Lines:   PICC Double Lumen 00/93/81 Right;Basilic (Active)       Venous Access Device PowerPort 12/20/14 Upper chest (subclavicular area), left (Active)       Peripheral IV 11/26/16 Left Hand (Active)   Site Assessment Clean, dry, & intact 12/03/2016  9:05 AM   Phlebitis Assessment 0 12/03/2016  9:05 AM   Infiltration Assessment 0 12/03/2016  9:05 AM   Dressing Status Clean, dry, & intact 12/03/2016  9:05 AM   Dressing Type Transparent 12/03/2016  9:05 AM   Hub Color/Line Status Capped 12/03/2016  9:05 AM   Action Taken Open ports on tubing capped 12/03/2016  9:05 AM   Alcohol Cap Used Yes 12/03/2016  9:05 AM        Opportunity for questions and clarification was provided.      Patient transported with:   Registered Nurse

## 2016-12-04 MED FILL — CEFAZOLIN 2 GM/50 ML IN DEXTROSE (ISO-OSMOTIC) IVPB: 2 gram/50 mL | INTRAVENOUS | Qty: 50

## 2016-12-04 MED FILL — DOK 100 MG CAPSULE: 100 mg | ORAL | Qty: 1

## 2016-12-04 MED FILL — WELCHOL 625 MG TABLET: 625 mg | ORAL | Qty: 1

## 2016-12-04 MED FILL — DOK PLUS 8.6 MG-50 MG TABLET: ORAL | Qty: 1

## 2016-12-04 MED FILL — FAMOTIDINE 20 MG TAB: 20 mg | ORAL | Qty: 1

## 2016-12-04 MED FILL — ASPIRIN 325 MG TAB, DELAYED RELEASE: 325 mg | ORAL | Qty: 1

## 2016-12-04 MED FILL — ACETAMINOPHEN 500 MG TAB: 500 mg | ORAL | Qty: 2

## 2016-12-04 MED FILL — NORMAL SALINE FLUSH 0.9 % INJECTION SYRINGE: INTRAMUSCULAR | Qty: 20

## 2016-12-04 MED FILL — POLYETHYLENE GLYCOL 3350 17 GRAM (100 %) ORAL POWDER PACKET: 17 gram | ORAL | Qty: 1

## 2016-12-04 MED FILL — SYNTHROID 112 MCG TABLET: 112 mcg | ORAL | Qty: 1

## 2016-12-04 NOTE — Discharge Summary (Signed)
@  9QZES@ [facility]   [place] [address]    DISCHARGE SUMMARY     Patient: Jillian Bean                             Medical Record Number: [id number]                DOB: [birth year]  Age: 76 y.o.  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 female with a history of Right knee pain, swelling, and marked loss of function.  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    Hospital Course:  Jillian Bean tolerated the procedure well.  She was transferred  to the recovery room in stable condition.  After a brief stay the patient was then transferred to the Joint Replacement Unit at [facility].  On postoperative day #1, the dressing was clean and dry, she was neurovascularly intact. The patient was afebrile and vital signs were stable.  Calves were soft and non-tender bilaterally. On postoperative day  # 2, the patient was tolerating a regular diet and making satisfactory progress with physical therapy.  Hemoglobin and INR prior to discharge were   Lab Results   Component Value Date/Time    HGB 9.3 (L) 11/30/2016 10:32 AM    INR 1.0 11/21/2016 01:34 PM   .  Jillian Bean made satisfactory  progress with physical therapy and was discharged to Home in stable condition on postoperative day 8.  She was provided with routine postoperative instructions and advised to follow up in my office in 3 weeks following discharge from the hospital.  She was prescribed ASA for DVT prophylaxis and oxycodone for post-operative pain.    Discharge Medications:  Cannot display discharge medications since this patient is not currently admitted.      Signed by: Zollie Beckers, MD  12/18/2016

## 2016-12-04 NOTE — Progress Notes (Signed)
Chart reviewed. Patient had PICC placed last night. Plan is for discharge to [facility] today. [facility] will transport patient via stretcher at 12:30 PM. Erline Levine with [facility] is aware. Patient and RN also aware.    Discharge folder located on hard chart to include ambulance form and discharge instructions. RN to follow with K[facility] and MAR. RN to call report to [phone] and ask for wing 2.  ??  Regenia Skeeter, BSW/CRM

## 2016-12-04 NOTE — Discharge Summary (Signed)
@  8BTDV@ [facility]   [place] [address]    DISCHARGE SUMMARY     Patient: Jillian Bean                             Medical Record Number: [id number]                DOB: [birth year]  Age: 76 y.o.  Admit Date: 11/26/2016  Discharge Date: 12/04/2016  Admission Diagnosis: FAILED TOTAL RIGHT KNEE REPLACEMENT  Failed total knee, right (HCC)  Discharge Diagnosis: FAILED TOTAL RIGHT KNEE REPLACEMENT  Procedures: Procedure(s):   REMOVAL BOTH COMPONENTS RIGHT KNEE, REMOVAL INFECTED RIGHT KNEE, REVISION RIGHT TOTAL KNEE  Surgeon: Muhammad Vacca  Assistants: Mulhern  Anesthesia: spinal  Complications: None     History of Present Illness:  DEZIYAH Bean is a 76 y.o. female with a history of Right knee pain, swelling, and marked loss of function.  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    Hospital Course:  Jillian Bean tolerated the procedure well.  She was transferred  to the recovery room in stable condition.  After a brief stay the patient was then transferred to the Joint Replacement Unit at [facility].  On postoperative day #1, the dressing was clean and dry, she was neurovascularly intact. The patient was afebrile and vital signs were stable.  Calves were soft and non-tender bilaterally. On postoperative day  # 2, the patient was tolerating a regular diet and making satisfactory progress with physical therapy.  Hemoglobin and INR prior to discharge were   Lab Results   Component Value Date/Time    HGB 9.3 (L) 11/30/2016 10:32 AM    INR 1.0 11/21/2016 01:34 PM    .  Jillian Bean made satisfactory progress with physical therapy and was discharged to Home in stable condition on postoperative day 8.  She was provided with routine postoperative instructions and advised to follow up in my office in 3 weeks following discharge from the hospital.  She was prescribed ASA for DVT prophylaxis and oxycodone for post-operative pain.    Discharge Medications:  Cannot display discharge medications since this patient is not currently admitted.      Signed by: Zollie Beckers, MD  12/18/2016

## 2016-12-04 NOTE — Progress Notes (Signed)
TRANSFER - OUT REPORT:    Verbal report given to Tangie, RN(name) on Jillian Bean  being transferred to [employer]) for routine progression of care       Report consisted of patient???s Situation, Background, Assessment and   Recommendations(SBAR).     Information from the following report(s) SBAR, Kardex, OR Summary, Procedure Summary, Intake/Output, MAR and Recent Results was reviewed with the receiving nurse.    Lines:   PICC Double Lumen 74/08/14 Right;Basilic (Active)   Central Line Being Utilized Yes 12/04/2016  7:56 AM   Criteria for Appropriate Use Long term IV/antibiotic administration 12/04/2016  7:56 AM   Site Assessment Clean, dry, & intact 12/04/2016  7:56 AM   Phlebitis Assessment 0 12/04/2016  7:56 AM   Infiltration Assessment 0 12/04/2016  7:56 AM   Date of Last Dressing Change 12/03/16 12/04/2016  7:56 AM   Dressing Status Clean, dry, & intact 12/04/2016  7:56 AM   Action Taken Open ports on tubing capped 12/03/2016  6:00 PM   Dressing Type Disk with Chlorhexadine gluconate (CHG);Transparent 12/04/2016  7:56 AM   Hub Color/Line Status Purple 12/04/2016  9:43 AM   Positive Blood Return (Site #1) Yes 12/04/2016  9:43 AM   Hub Color/Line Status Red 12/04/2016  9:43 AM   Positive Blood Return (Site #2) Yes 12/04/2016  9:43 AM   Alcohol Cap Used Yes 12/04/2016  9:43 AM       Venous Access Device PowerPort 12/20/14 Upper chest (subclavicular area), left (Active)        Opportunity for questions and clarification was provided.      Patient transported with:   [employer]

## 2016-12-04 NOTE — Other (Signed)
Bedside shift change report given to Linda ,RN (oncoming nurse) by Devon Jones, RN(offgoing nurse).  Report given with SBAR.

## 2016-12-05 LAB — CULTURE, BLOOD, PAIRED: Culture result:: NO GROWTH

## 2016-12-06 NOTE — Progress Notes (Signed)
Hospital Discharge Follow-Up      Date/Time:  12/06/2016 2:20 PM    Patient was admitted to [facility] on 11/26/16 for a scheduled procedure and discharged on 12/04/16 for PROCEDURE PERFORMED:  Removal of infected total knee, placement of antibiotic spacer. SURGEON:  Zollie Beckers, MD. The physician discharge summary was available at the time of outreach.  [facility] was contacted within 2 business days of discharge.      Top Challenges reviewed with the provider   Patient located in [facility]         Method of communication with provider : none    Inpatient RRAT score: 69  Was this a readmission? no   Patient stated reason for the readmission: n/a    Nurse Navigator (NN) contacted the caregiver [facility] by telephone to perform post hospital discharge assessment. Provided introduction to self, and explanation of the Nurse Navigator role.     Summary of patients top problems:  1. Removal of infected total knee, placement of antibiotic spacer.       Home Health orders at discharge: [place]: n/a  Date of initial visit: n/a    Durable Medical Equipment ordered/company: n/a  Durable Medical Equipment received: n/a    Barriers to care? Located in [place]:   Does patient have an Advance Directive:  reviewed and current       Medication:     New Medications at Discharge: aspirin delayed-release 325 mg tablet & Tramadol  Changed Medications at Discharge: n/a  Discontinued Medications at Discharge: diclofenac EC 50 mg EC tablet, Tylenol & ASA 81 mg    Medication reconciliation was not performed with staff.    Referral to Pharm D needed: no     Current Outpatient Prescriptions   Medication Sig   ??? aspirin delayed-release 325 mg tablet Take 1 Tab by mouth two (2) times a day.   ??? traMADol (ULTRAM) 50 mg tablet Take 1 Tab by mouth every six (6) hours as needed. Max Daily Amount: 200 mg.   ??? OTHER    ??? brimonidine-timolol (COMBIGAN) 0.2-0.5 % drop ophthalmic solution  Administer 1 Drop to both eyes every twelve (12) hours.   ??? raNITIdine (ZANTAC) 150 mg tablet Take 1 Tab by mouth two (2) times a day.   ??? alendronate (FOSAMAX) 70 mg tablet Take 1 Tab by mouth every seven (7) days.   ??? colesevelam (WELCHOL) 625 mg tablet 1 po bid   ??? levothyroxine (SYNTHROID) 112 mcg tablet 8 per week   ??? docusate sodium (COLACE) 100 mg capsule Take 100 mg by mouth daily.   ??? multivitamin (ONE A DAY) tablet Take 1 Tab by mouth daily.     No current facility-administered medications for this visit.        There are no discontinued medications.    PCP/Specialist follow up: Future Appointments  Date Time Provider [place]   01/16/2017 9:00 AM Bud Face, NP [facility] SCHED   03/19/2017 11:00 AM [place]      Goals     ??? Attends follow-up appointments as directed.            Follow up with Dr. Elvin So in 2 weeks.  Call [phone] to make an appointment

## 2016-12-11 NOTE — Progress Notes (Signed)
Community Care Team Documentation for Patient in [place]  Initial Follow Up       Patient was admitted to [facility] from 11/26/16 to 12/04/16.    Patient was discharged to [facility] [place], on 12/04/16 (date).    Community Care Team followed up to [place] during this transition.    Hospital Discharge diagnosis:  Prosthetic knee infection; s/p right total knee revision.    RRAT score: 13    Advance Medical Directive on file in EMR? Advance Directive    Total Hospitalizations/ED visits last 6 months?   IP - 1; ED - 0    PCP : [name], MD  Nurse Navigator in PCP office: Jamesetta Orleans  Note routed to Nurse Navigator team.    SNF Attending:  Garner Gavel, MD  Per Romie Jumper at SNF, Reviewed [place] questions with SNF to ensure patient arrived with admission packet in order.  PT/OT providing skilled therapy at SNF.  Currently patient is NWB with immobilizer to right knee.  Patient able to hop/walk 40-80 ft with assistance.  Follow up appointments with Dr. Elvin So, ortho, and with Infectious Disease provider scheduled for week of 5/14.  Weight bearing status to be discussed at ortho follow up.  Patient remains on IV antibiotics at this time.  Plan for discharge home with spouse and [facility] if no patient preference.  CCT will schedule PCP follow up once discharge is planned.    Community Care Team will follow up weekly with [place].  Medications were not reconciled and general patient assessment was not completed during this skilled nursing facility outreach.     Fabian Sharp, MSN, RN, ACNS-BC, CCM  Nurse Navigator, [employer] [phone]

## 2016-12-18 NOTE — Progress Notes (Signed)
Community Care Team Documentation for Patient in [place]  Subsequent Follow up     Patient remains at [employer] ([place]).   See previous [facility] Team notes.    PCP : [name], MD  Nurse Navigator in PCP office: Jamesetta Orleans    Per Romie Jumper at [facility], PT/OT continue. Currently mod assist with transfers. Ambulating 40-68ft with a hop step. Attending ortho FU today regarding weight bearing status. ID FU 5/15. Plan to DC home with husband and [facility]. LOS: TBD.     Medications were not reconciled and general patient assessment was not completed during this skilled nursing facility outreach.     Roby Lofts, BSW

## 2016-12-25 NOTE — Progress Notes (Signed)
Community Care Team Documentation for Patient in [place]  Subsequent Follow up     Patient remains at [facility] ([place]).   See previous [facility] Team notes.    PCP : [name], MD  Nurse Navigator in PCP office: Jamesetta Orleans  PCP follow up appointment:  5/29 at 11:00 AM    Per Romie Jumper at [facility], PT/OT continue.  Currently mod I for transfers, hopping with RW.  Patient attended ID follow up appointment today and will continue IV antibiotic through 5/28.  Next follow up with ID on 5/30.  Therapy planning to work with patient through 5/21 for now.  Possible discharge to home with spouse and [facility] next week.  PCP follow up 5/29 at 11:00 AM.    Medications were not reconciled and general patient assessment was not completed during this skilled nursing facility outreach.     Fabian Sharp, MSN, RN, ACNS-BC, CCM  Nurse Navigator, [employer] [phone]

## 2017-01-02 NOTE — Progress Notes (Signed)
Community Care Team Documentation for Patient in [place]  Subsequent Follow up     Patient remains at [facility] ([place]).   See previous [facility] Team notes.    PCP : [name], MD  Nurse Navigator in PCP office: Jamesetta Orleans  PCP follow up appointment:  6/4 at 3:00 PM.    Per Romie Jumper at [facility], Patient receiving skilled nursing services for IV antibiotic administration only at this time.  Plan was for discharge on 5/22.  However, patient developed fever on 5/21.  SNF contact Dr. Patsey Berthold, [place], who asked that patient not be discharged home until fever resolved and patient feeling better.  Patient continues to experience lethargy and not feel well.  IV antibiotic ordered through 5/28 with follow up ID Dr. Patsey Berthold on 5/30.  Disposition/date TBD.  PCP follow up 6/4 at 3:00 PM    Medications were not reconciled and general patient assessment was not completed during this skilled nursing facility outreach.     Fabian Sharp, MSN, RN, ACNS-BC, CCM  Nurse Navigator, [employer] [phone]

## 2017-01-08 ENCOUNTER — Encounter: Attending: Internal Medicine | Primary: Internal Medicine

## 2017-01-08 NOTE — Progress Notes (Signed)
Community Care Team Documentation for Patient in [place]  Subsequent Follow up     Patient remains at [facility] ([place]).   See previous [facility] Team notes.    PCP : [name], MD  Nurse Navigator in PCP office: Jamesetta Orleans  PCP follow up appointment:  01/14/17 at 3:00 PM    Per Romie Jumper at [facility], IV antibiotics complete.  SNF waiting to hear from Dr. Patsey Berthold today about pulling PICC line.  If clearance given for that today, PICC will be pulled today, and patient will discharge 5/30 to home with husband and At [place] [facility] pending patient choice.  PCP follow up 6/4 at 3:00 PM.    Medications were not reconciled and general patient assessment was not completed during this skilled nursing facility outreach.     Fabian Sharp, MSN, RN, ACNS-BC, CCM  Nurse Navigator, [employer] [phone]

## 2017-01-14 ENCOUNTER — Encounter: Attending: Internal Medicine | Primary: Internal Medicine

## 2017-01-15 ENCOUNTER — Inpatient Hospital Stay: Admit: 2017-04-29 | Payer: MEDICARE | Primary: Internal Medicine

## 2017-01-15 DIAGNOSIS — Z853 Personal history of malignant neoplasm of breast: Secondary | ICD-10-CM

## 2017-01-15 NOTE — Progress Notes (Signed)
Community Care Team Documentation for Patient in [place]  Discharge Note    Per SNF staff, patient discharged from [facility] ([place]).  See previous [facility] Team notes.    PCP : [name], MD    PCP TOC follow up appointment:  6/4 at 3:00 PM.     Nurse Navigator in PCP office: Jamesetta Orleans  Note routed to Nurse Navigator team.    Per Romie Jumper and team at [facility], Confirmed patient discharged 5/30 to home with husband and At [place] per patient choice.  PCP follow up 6/4 at 3:00 PM.    SNF LOS:  36 days    Community Care Team will sign off at this time.  Medications were not reconciled and general patient assessment was not completed during this skilled nursing facility outreach.     Fabian Sharp, MSN, RN, ACNS-BC, CCM  Nurse Navigator, [employer] [phone]

## 2017-01-16 ENCOUNTER — Ambulatory Visit: Attending: Nurse Practitioner | Primary: Internal Medicine

## 2017-01-16 LAB — CBC W/O DIFF
HCT: 28.2 % — ABNORMAL LOW (ref 34.0–46.6)
HGB: 9 g/dL — ABNORMAL LOW (ref 11.1–15.9)
MCH: 28.4 pg (ref 26.6–33.0)
MCHC: 31.9 g/dL (ref 31.5–35.7)
MCV: 89 fL (ref 79–97)
PLATELET: 251 10*3/uL (ref 150–379)
RBC: 3.17 x10E6/uL — ABNORMAL LOW (ref 3.77–5.28)
RDW: 15.6 % — ABNORMAL HIGH (ref 12.3–15.4)
WBC: 3.9 10*3/uL (ref 3.4–10.8)

## 2017-01-16 NOTE — Progress Notes (Signed)
Chief Complaint   Patient presents with   ??? Follow-up     anemia, discuss labs...had done yesterday     Coordination of Care Questions    1. Have you been to the ER, urgent care clinic since your last visit? No       Hospitalized since your last visit?  11/26/16 had R knee joint surgery to clean infection and was in rehab for 6 weeks for IV abx prior    2. Have you seen or consulted any other health care providers outside of the [facility] since your last visit?  Include any pap smears or colon screening. No

## 2017-01-16 NOTE — Progress Notes (Signed)
[facility]  [place], [place]   [address]  W: [phone]   F: [phone]      F/u HEME/ONC CONSULT    Reason for visit: evaluation for treatment for breast cancer    Consulting physician: Dr. Gilford Rile    HPI:   Jillian Bean is a 76 y.o.  female who I was asked to see in consultation at the request of Dr. Elie Confer for evaluation for systemic therapy for breast cancer.    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 Right lumpectomy on 11/10/14 shows IDC, 1.7 cm, gr 3, 0/5 LN, no LVI.  PT1cN0Mx.    Artois q 3 weeks x 6: 12/21/14- 04/12/15  C#5 held 1 week on 03/15/15 due to thrombocytopenia   Outback Herceptin: 05/03/15-11/29/15    S/p XRT 06/03/15    Bone Marrow Bx 10/09/16: Variably normocellular marrow with trilineage hematopoiesis.    Interval History: In today for follow up. Complains of gr gr 1 fatigue, gr 2 hair loss, gr 2 chills, gr 1 sob, gr 1 swelling.    She was admitted to [facility] on 11/26/16-12/04/16 for knee infection/removal of previous knee replacement.  She then stayed in SNF for IV abx until 01/10/17.  States she received 2 U PRBCs while admitted to the hospital. She is planning on getting a knee revision in 03/2017.     06/20/16 hgb 11.5  07/25/16 hgb 8.7  01/07/17 Hgb 7.1  01/09/17 Hgb 9.3   01/15/17 Hgb 9.0     DX   Encounter Diagnoses   Name Primary?   ??? History of breast cancer Yes   ??? Anemia, normocytic normochromic    ??? Osteoporosis without current pathological fracture, unspecified osteoporosis type    ??? Acute bilateral low back pain with sciatica, sciatica laterality unspecified    ??? Chronic right-sided low back pain with right-sided sciatica      Past Medical History:   Diagnosis Date   ??? Anemia    ??? Back pain 09/01/2007   ??? Cancer (HCC)     R breast   ??? Chronic pain     BACK PAIN   ??? Coagulation disorder ([place])      IRON DEFFICIENCY ANEMIA   ??? Colonic polyps 08/31/1998   ??? 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 Hiatal hernia 07/11/2011    Dr.sobieski   ??? Hypercholesteremia 09/01/2007   ??? Hypothyroidism 09/01/2003   ??? 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 Nausea & vomiting    ??? OA (osteoarthritis) 09/01/2007   ??? Other ill-defined conditions(799.89)     VERTIGO   ??? Plantar fasciitis 09/01/2007   ??? S/P colonoscopy 10/10/2007   ??? Thyroid disease      Past Surgical History:   Procedure Laterality Date   ??? BREAST SURGERY PROCEDURE UNLISTED  1993    Breast Reduction   ??? COLONOSCOPY N/A 11/12/2016    COLONOSCOPY performed by Burnett Harry, MD at [place]   ??? COLONOSCOPY,DIAGNOSTIC  11/12/2016        ??? ENDOSCOPY, COLON, DIAGNOSTIC  7/12/18/08    7/05 Dr Maudry Diego   ??? HX BREAST LUMPECTOMY Right 2016   ??? HX COLONOSCOPY  2014  Dr Illene Silver   ??? HX GI      COLONOSCOPY   ??? HX GYN      Hysterectomy in her 39's   ??? HX HEENT  04/07/10    thyroid biopsy neg dr Leward Quan   ??? HX HEENT      wisdom teeth extraction   ??? HX ORTHOPAEDIC  08/22/09    left TKR   ??? HX ORTHOPAEDIC  08/13/12    right tkr   ??? HX ORTHOPAEDIC Right 11/26/2016    cleaned infection in joint   ??? HX OTHER SURGICAL  2013    MINIMALLY INVASIVE LUMBAR DECOMPRESSION   ??? UPPER GI ENDOSCOPY,BIOPSY  11/12/2016          Social History     Social History   ??? Marital status: MARRIED     Spouse name: N/A   ??? Number of children: N/A   ??? Years of education: N/A     Social History Main Topics   ??? Smoking status: Never Smoker   ??? Smokeless tobacco: Never Used   ??? Alcohol use No   ??? Drug use: No   ??? Sexual activity: Not Currently     Other Topics Concern   ??? None     Social History Narrative     Family History   Problem Relation Age of Onset   ??? Cancer Mother      Breast   ??? Heart Failure Father      MI   ??? Seizures Brother      ms   ??? Anesth Problems Neg Hx        Current Outpatient Prescriptions    Medication Sig Dispense Refill   ??? aspirin delayed-release 325 mg tablet Take 1 Tab by mouth two (2) times a day. 1 Tab 0   ??? brimonidine-timolol (COMBIGAN) 0.2-0.5 % drop ophthalmic solution Administer 1 Drop to both eyes every twelve (12) hours.     ??? raNITIdine (ZANTAC) 150 mg tablet Take 1 Tab by mouth two (2) times a day. 60 Tab 5   ??? colesevelam (WELCHOL) 625 mg tablet 1 po bid 180 Tab 2   ??? levothyroxine (SYNTHROID) 112 mcg tablet 8 per week 100 Tab 3   ??? docusate sodium (COLACE) 100 mg capsule Take 100 mg by mouth daily.     ??? multivitamin (ONE A DAY) tablet Take 1 Tab by mouth daily.     ??? cephALEXin (KEFLEX) 500 mg capsule TK 1 C PO QID  0   ??? traMADol (ULTRAM) 50 mg tablet Take 1 Tab by mouth every six (6) hours as needed. Max Daily Amount: 200 mg. (Patient not taking: Reported on 01/16/2017) 80 Tab 0   ??? OTHER      ??? alendronate (FOSAMAX) 70 mg tablet Take 1 Tab by mouth every seven (7) days. 4 Tab 11     Allergies   Allergen Reactions   ??? Atorvastatin Myalgia   ??? Zocor [Simvastatin] Other (comments) and Diarrhea     Other reaction(s): Adverse reaction to substance   Myalgias     ??? Celebrex [Celecoxib] Other (comments)     Aggitation   ??? Gabapentin Other (comments)     FELT UNSTEADY ON MY FEET   ??? Levaquin [Levofloxacin] Palpitations   ??? Nexium [Esomeprazole Magnesium] Diarrhea   ??? Other Medication Swelling     Latanoprost drops   ??? Oxycontin [Oxycodone] Itching   ??? Pravastatin Nausea and Vomiting   ??? Statins-Hmg-Coa Reductase Inhibitors Other (  comments)     lymphacytic colitis     ??? Yellow Dye Hives       Review of Systems    A comprehensive review of systems was performed and all systems were negative except for HPI and for the symptom report form, reviewed and scanned in.    Objective:  Physical Exam:  Visit Vitals   ??? BP 112/46 (BP 1 Location: Left arm, BP Patient Position: Sitting)   ??? Pulse 70   ??? Temp 96.4 ??F (35.8 ??C) (Oral)   ??? Resp 14   ??? LMP 04/13/2010   ??? SpO2 96%        General:  Alert, cooperative, no distress, appears stated age.   Head:  Normocephalic, without obvious abnormality, atraumatic.   Eyes:  Conjunctivae/corneas clear. PERRL, EOMs intact.   Throat: Lips, mucosa, and tongue normal.    Neck: Supple, symmetrical, trachea midline, no adenopathy, thyroid: no enlargement/tenderness/nodules   Back:   Symmetric, no curvature. ROM normal. No CVA tenderness.   Lungs:   Clear to auscultation bilaterally.   Chest wall:  No tenderness or deformity.   Heart:  Regular rate and rhythm, S1, S2 normal, no murmur, click, rub or gallop.   Breast Exam:  S/p R lumpectomy.    Abdomen:   Soft, non-tender. Bowel sounds normal. No masses,  No organomegaly.   Extremities: Right knee swollen   Skin: Improved macular rash on elbows, back, abdomen lower, upper thighs, back of legs   Lymph nodes: Cervical, supraclavicular, and axillary nodes normal.   Neurologic: CNII-XII intact.     Diagnostic Imaging     12/16/14 TTE: EF 71%.  03/02/15 TTE: EF 68%  05/30/15 TTE :  EF 65%  07/21/15 TTE: EF 66%  10/06/15 TTE: EF 62%  05/23/16 TTE EF 60%    03/15/16 dexa  Findings:  ????  Fractures identified on Lateral scanogram:  None  ????  Femoral Neck:  Right  Bone mineral density (gm/cm2):? 0.649  % of peak bone mass: 63  % for age matched controls:? 86  T-score: -2.8  Z-score: -0.8  ????  Total Hip: Right  Bone mineral density (gm/cm2):  0.761  % of peak bone mass:   76  % for age matched controls:  98  T-score:   -2.0  Z-score:  -0.1  ??  Lumbar Spine:  L1-L4  Bone mineral density (gm/cm2):  1.211  % of peak bone mass:  101   % for age matched controls:  124  T-score:  0.1  Z-score:  2.0  ??  The T score for the left distal third radius is -1.4.  ????  IMPRESSION  Impression:  ????  This patient is osteoporotic using the World Health Organization criteria  As compared to the prior study, there has been no change in the bone mineral  density of the right total hip and an increase in the bone mineral density of   the lumbar spine of 2.0%.  10 year probability of major osteoporotic fracture:  19.6%  10 year probability of hip fracture:  7.4%    09/20/16 CT c/a/p:  IMPRESSION:  No acute process in the chest, abdomen, and pelvis.  ??    Lab Results  Lab Results   Component Value Date/Time    WBC 3.9 01/15/2017 12:19 PM    HGB 9.0 (L) 01/15/2017 12:19 PM    HCT 28.2 (L) 01/15/2017 12:19 PM    PLATELET 251 01/15/2017 12:19 PM    MCV  89 01/15/2017 12:19 PM     Lab Results   Component Value Date/Time    Sodium 141 11/30/2016 05:10 AM    Potassium 3.2 (L) 11/30/2016 05:10 AM    Chloride 108 11/30/2016 05:10 AM    CO2 27 11/30/2016 05:10 AM    Anion gap 6 11/30/2016 05:10 AM    Glucose 100 11/30/2016 05:10 AM    BUN 7 11/30/2016 05:10 AM    Creatinine 0.68 11/30/2016 05:10 AM    BUN/Creatinine ratio 10 (L) 11/30/2016 05:10 AM    GFR est AA >60 11/30/2016 05:10 AM    GFR est non-AA >60 11/30/2016 05:10 AM    Calcium 8.4 (L) 11/30/2016 05:10 AM    AST (SGOT) 35 11/28/2016 05:25 AM    Alk. phosphatase 90 11/28/2016 05:25 AM    Protein, total 6.0 (L) 11/28/2016 05:25 AM    Albumin 2.4 (L) 11/28/2016 05:25 AM    Globulin 3.6 11/28/2016 05:25 AM    A-G Ratio 0.7 (L) 11/28/2016 05:25 AM    ALT (SGPT) 14 11/28/2016 05:25 AM       Lab Results   Component Value Date/Time    Reticulocyte count 1.5 09/05/2016 09:40 AM    Iron % saturation 22 07/30/2016 09:31 AM    TIBC 161 (L) 07/30/2016 09:31 AM    Ferritin 1982 (H) 07/30/2016 09:31 AM    Vitamin B12 542 07/30/2016 09:31 AM    Folate 14.8 07/30/2016 09:31 AM    Haptoglobin 412 (H) 09/05/2016 09:40 AM    LD 192 09/05/2016 09:40 AM    Sed rate, automated 67 (H) 11/28/2016 05:25 AM    C-Reactive protein 21.70 (H) 11/28/2016 05:25 AM    TSH 2.560 07/25/2016 02:57 PM     Lab Results   Component Value Date/Time    INR 1.0 11/21/2016 01:34 PM    aPTT 28.7 08/18/2009 01:05 PM       Assessment/Plan:  76 y.o. 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  PS 0    1. Right Breast cancer stage: IA    Hormonal therapy: not indicated due to receptor (-) status    We explained to the patient that the goal of systemic adjuvant therapy is to improve the chances for cure and decrease the risk of relapse. We explained why a patient can have microscopic cancer spread now even though physical examination, laboratory studies and imaging studies are negative for cancer. We explained that the same treatments used now as adjuvant or preventive treatments rarely if ever are curative in women who develop metastases.     No evidence of recurrence    Mammogram in March 2018 negative at [facility] breast [facility], due in March 2019    She declined to take neratinib.    2. Emotional well being: She has excellent support and is coping well with her disease.    3. FH of breast cancer: BRCA 1/2 [place] testing negative    4. Osteoporosis: On dexa from 2015; is now on fosamax from Dr. Wanda Plump, started 08/2016. DEXA 03/15/16 showed some improvement    5. Back pain low/radiculopathy: Dr. Dema Severin, pain specialist, was following her. NM bone scan and xr show degeneration. Steroid injection in of Octobert; L-spine MRI 01/19/16 shows DDD.  States last steroid injection 09/24/16.     6. Anemia: Hgb 9.0 on 01/15/17, improving Normocytic; unclear as to drop since Nov, likely due to chemo as now  is improving.  Elevated ferritin as well.  No B12 def, normal folate, normal iron studies.  Normal TSH    The differential diagnosis for a normocytic anemia is broad, and includes blood loss, anemia of chronic disease, chronic renal insufficiency, iron deficiency, hypothyroidism, hemolysis, and bone marrow suppression.  MDS, B12 deficiency, folate deficiency, and etoh abuse can also lead to anemia, though it is generally macrocytic.      Bone Marrow bx 10/02/16: normal. Referred to GI -- Dr. Alita Chyle, on 11/12/16 EGD showed atrophic gastritis and hiatal hernia and colonoscopy showed int  hemorrhoids and diverticulosis.      Recently had knee infection/septic arthritis (staph) in April 2018 which resulted in removal of old knee replacement. Is seeing Dr. Janyth Pupa in ID at [facility]  Next follow up with him is 01/23/17 with labs.  Will have labs sent to [name].  She plans to have revision of knee in August.  Ok to proceed with knee surgery. Transfuse for hgb < 7 or per protocol.   Would avoid EPO with history of breast cancer.  Anemia improving, but unclear cause.  Unlikely related to chemotherapy as this occurred a year from chemo.  May have been a episode of gastritis?  Previously recommended capsule study at some point with Dr. Alita Chyle.  At this point she would prefer to hold off on this until she has her knee replacement in August.     Will repeat CBC in 3 months    Thank you for this consult.  All of the patient's questions were answered today.        Follow-up Disposition:  Return in about 3 months (around 04/18/2017) for 3 month fu, Sand/Devine Dant.    Sonda Rumble, MD

## 2017-01-17 NOTE — Progress Notes (Signed)
Call placed to pt, ID X 2.  Pt recently d/c from [facility] following stay for IV ABX & rehab. Diagnosis: Removal of infected right total knee, placement of antibiotic spacer. IV ABX completed prior to d/c now on Keflex 500 mg QID    Patient denies C/P, SOB, cough, wheezing, fever, pain/swelling of legs or feet ( other than surgical leg, better in AM), N/V, diarrhea, difficulty urinating or constipation. BM 01/16/17. Appetite/hydration good. Med rec completed.    Pt reported she is improving, but remains nonweightbearing bearing right leg.       Future Appointments      Provider [place]   01/18/2017 10:15 AM [name], MD [facility]   02/15/2017 3:00 PM [place] PAT EXAM RM 3 [facility]mit Testing [facility]   03/19/2017 11:00 AM Doy Hutching [facility] LYMPHEDEMA CL [facility]   04/17/2017 11:00 AM Bud Face, NP [facility] [place]     Ortho - TBD  ID DR. [place]:  At [place] only for 2 more visits    Goals     ??? Attends follow-up appointments as directed.            Follow up with Dr. Elvin So in 2 weeks.  Call [phone] to make an appointment      ??? Take ABX as prescribed until completed     ??? Understands red flags post discharge             Advised to check temp daily

## 2017-01-18 ENCOUNTER — Inpatient Hospital Stay: Admit: 2017-04-25 | Payer: MEDICARE | Primary: Internal Medicine

## 2017-01-18 ENCOUNTER — Ambulatory Visit: Admit: 2017-01-18 | Discharge: 2017-01-18 | Payer: MEDICARE | Attending: Internal Medicine | Primary: Internal Medicine

## 2017-01-18 ENCOUNTER — Inpatient Hospital Stay: Admit: 2017-01-18 | Payer: MEDICARE | Attending: Internal Medicine | Primary: Internal Medicine

## 2017-01-18 DIAGNOSIS — E039 Hypothyroidism, unspecified: Secondary | ICD-10-CM

## 2017-01-18 DIAGNOSIS — I82431 Acute embolism and thrombosis of right popliteal vein: Secondary | ICD-10-CM

## 2017-01-18 NOTE — Telephone Encounter (Signed)
Incoming call from Vascular lab at [facility] notifying of patient is positive for a DVT in her right leg. Advised will contact PCP for further instructions but for now can let the patient leave.     PCP contacted. She will reach out to the patient today for further instructions.

## 2017-01-18 NOTE — Progress Notes (Signed)
Right LE venous duplex completed. Positive prelim results called in to [place] office. Spoke with nurse Katharine Look. Pt sent home and Dr. Wanda Plump will call with instruction later today.Final results to follow.

## 2017-01-18 NOTE — Progress Notes (Signed)
HISTORY OF PRESENT ILLNESS  Jillian Bean is a 76 y.o. female.  HPI  Transition of care visit for [name], who has had a rough time.  She developed redness and pain around right knee.  Saw Dr. Cheron Every, who diagnosed her with prosthetic joint infection.  She was hospitalized April 16th-April 24th at [name] with Dr. Elvin So and then went to [name] for skilled nursing until May 31st.  She's been home since May 31st.  She's been completely non weightbearing right leg with a spacer in place because the hardware was removed, found to be infected with staph.  She has seen Dr. Patsey Berthold from [name] and she is currently on Keflex.  She's had no fever since coming home, but did apparently have fevers while at [name].  She's tolerating the antibiotics, extended course.  She's on Florastor with no diarrhea.  She has been anemic for over a year and I am wondering if some of this is from the chronic infection.  She's not seen any GI bleeding.  She's not needed Miralax.  She is using Colace daily.  She has not needed Tramadol for pain and is managing pain with Tylenol.  Her blood cultures were no growth.  Her last chemistry panel looked normal.  Her HGB was 9.3.  She is currently on 112 of Levothyroxine daily, but two tabs on Thursdays . She's on Welchol 625 b.i.d., 81 mg of aspirin daily, Colace 100 daily, Florastor daily, Ranitidine 75 mg, two tabs b.i.d., and a multivitamin.      Review of Systems   Constitutional: Negative for chills, fever, malaise/fatigue and weight loss.   Cardiovascular: Positive for leg swelling.   Gastrointestinal: Negative for abdominal pain, constipation and diarrhea.   Genitourinary: Negative for dysuria, frequency and urgency.   Musculoskeletal: Negative for falls and joint pain.   Neurological: Negative for sensory change.   Psychiatric/Behavioral: Negative for depression.       Physical Exam   Constitutional: She is oriented to person, place, and time. She appears  well-developed and well-nourished.   HENT:   Head: Normocephalic and atraumatic.   Mouth/Throat: Oropharynx is clear and moist.   Neck: Normal range of motion. Neck supple. Carotid bruit is not present. No thyromegaly present.   Cardiovascular: Normal rate, regular rhythm, S1 normal, S2 normal, normal heart sounds and intact distal pulses.    No murmur heard.  Pulmonary/Chest: Effort normal and breath sounds normal. No respiratory distress. She has no wheezes. She has no rales.   Musculoskeletal: She exhibits no edema.   In a wheelchair and right leg in an immobilizer  Visible edema of right foot 1 plus    Neurological: She is alert and oriented to person, place, and time.   Psychiatric: She has a normal mood and affect. Her behavior is normal.   Nursing note and vitals reviewed.      ASSESSMENT and PLAN  Diagnoses and all orders for this visit:    1. Acquired hypothyroidism  -     T4, FREE  -     TSH 3RD GENERATION    2. Localized edema-with non weight bearing since April due to failure of right knee hardware and staph infection in joint space now with n o hardware and spacer in place  -     DUPLEX LOWER EXT VENOUS RIGHT; Future    3. Failed total right knee replacement, sequela  Working with dr Elvin So  4. Other iron deficiency anemia  -  CBC WITH AUTOMATED DIFF

## 2017-01-19 LAB — CBC WITH AUTOMATED DIFF
ABS. BASOPHILS: 0 10*3/uL (ref 0.0–0.2)
ABS. EOSINOPHILS: 0 10*3/uL (ref 0.0–0.4)
ABS. IMM. GRANS.: 0 10*3/uL (ref 0.0–0.1)
ABS. MONOCYTES: 0.3 10*3/uL (ref 0.1–0.9)
ABS. NEUTROPHILS: 3.4 10*3/uL (ref 1.4–7.0)
Abs Lymphocytes: 0.8 10*3/uL (ref 0.7–3.1)
BASOPHILS: 0 %
EOSINOPHILS: 0 %
HCT: 29.4 % — ABNORMAL LOW (ref 34.0–46.6)
HGB: 9.1 g/dL — ABNORMAL LOW (ref 11.1–15.9)
IMMATURE GRANULOCYTES: 0 %
Lymphocytes: 18 %
MCH: 28.3 pg (ref 26.6–33.0)
MCHC: 31 g/dL — ABNORMAL LOW (ref 31.5–35.7)
MCV: 91 fL (ref 79–97)
MONOCYTES: 7 %
NEUTROPHILS: 75 %
PLATELET: 228 10*3/uL (ref 150–379)
RBC: 3.22 x10E6/uL — ABNORMAL LOW (ref 3.77–5.28)
RDW: 16 % — ABNORMAL HIGH (ref 12.3–15.4)
WBC: 4.5 10*3/uL (ref 3.4–10.8)

## 2017-01-19 LAB — T4, FREE: T4, Free: 1.72 ng/dL (ref 0.82–1.77)

## 2017-01-19 LAB — TSH 3RD GENERATION: TSH: 0.919 u[IU]/mL (ref 0.450–4.500)

## 2017-01-19 MED ORDER — APIXABAN 5 MG TABLET
5 mg | ORAL_TABLET | ORAL | 0 refills | Status: DC
Start: 2017-01-19 — End: 2017-01-22

## 2017-01-19 NOTE — Addendum Note (Signed)
Addended by: [name] on: 01/19/2017 05:00 AM      Modules accepted: Orders

## 2017-01-19 NOTE — Telephone Encounter (Signed)
Pt notified and started on eliquis 10 mg bid for 7days and then 5 mg bid appt next week  Discussed to go to er if any cp or sob

## 2017-01-21 NOTE — Procedures (Signed)
[name]  *** FINAL REPORT ***    Name: Jillian Bean, Jillian Bean  MRN: [id number]    Outpatient  DOB: 06/03/1941  HIS Order #: [id number]  Foley Visit #: [id number]  Date: 18 Jan 2017    TYPE OF TEST: Peripheral Venous Testing    REASON FOR TEST  Limb swelling    Right Leg:-  Deep venous thrombosis:           Yes  Proximal extent of thrombus:      Popliteal Below The Knee  Superficial venous thrombosis:    No  Deep venous insufficiency:        No  Superficial venous insufficiency: No      INTERPRETATION/FINDINGS  PROCEDURE:  RIGHT LOWER EXTREMITY  VENOUS DUPLEX. Evaluation of lower  extremity veins with ultrasound (B-mode imaging, pulsed Doppler, color   Doppler).  Includes the common femoral, deep femoral, femoral,  popliteal, posterior tibial, peroneal, and great saphenous veins.  Other veins, for example the gastrocnemius and soleal veins, may also  be visualized.    FINDINGS: Unable to compress the right distal popliteal veins along  with absence of color flwow and augmentaiton, consistent with acute  deep vein thrombosis. Unable to evalute the paired right posterior  tibial or peroneal veins due to limb swelling and small vessel  caliber. Aslin scale and color flow duplex images of the  remaining  veins in the right lower extremity demonstrate normal compressibility,   spontaneous and augmented flow profiles, and absence of filling  defects throughout the deep and superficial veins in the right lower  extremity.    CONCLUSION:Right lower extremity venous duplex positive for acute  deep venous thrombosis.Negative for thrombophlebitis. Left common  femoral vein is thrombus free.    ADDITIONAL COMMENTS    I have personally reviewed the data relevant to the interpretation of  this  study.    TECHNOLOGIST: Zoila Shutter, RVT  Signed: 01/18/2017 04:12 PM    PHYSICIAN: Altamese Dilling T. Suzan Slick, MD  Signed: 01/21/2017 06:02 AM

## 2017-01-21 NOTE — Procedures (Signed)
[name]  *** FINAL REPORT ***    Name: Jillian Bean, Jillian Bean  MRN: [id number]    Outpatient  DOB: 1940/11/04  HIS Order #: [id number]  [place] Visit #: [id number]  Date: 18 Jan 2017    TYPE OF TEST: Peripheral Venous Testing    REASON FOR TEST  Limb swelling    Right Leg:-  Deep venous thrombosis:           Yes  Proximal extent of thrombus:      Popliteal Below The Knee  Superficial venous thrombosis:    No  Deep venous insufficiency:        No  Superficial venous insufficiency: No      INTERPRETATION/FINDINGS  PROCEDURE:  RIGHT LOWER EXTREMITY  VENOUS DUPLEX. Evaluation of lower  extremity veins with ultrasound (B-mode imaging, pulsed Doppler, color   Doppler).  Includes the common femoral, deep femoral, femoral,  popliteal, posterior tibial, peroneal, and great saphenous veins.  Other veins, for example the gastrocnemius and soleal veins, may also  be visualized.    FINDINGS: Unable to compress the right distal popliteal veins along  with absence of color flwow and augmentaiton, consistent with acute  deep vein thrombosis. Unable to evalute the paired right posterior  tibial or peroneal veins due to limb swelling and small vessel  caliber. Donigan scale and color flow duplex images of the  remaining  veins in the right lower extremity demonstrate normal compressibility,   spontaneous and augmented flow profiles, and absence of filling  defects throughout the deep and superficial veins in the right lower  extremity.    CONCLUSION:Right lower extremity venous duplex positive for acute  deep venous thrombosis.Negative for thrombophlebitis. Left common  femoral vein is thrombus free.    ADDITIONAL COMMENTS    I have personally reviewed the data relevant to the interpretation of  this  study.    TECHNOLOGIST: Zoila Shutter, RVT  Signed: 01/18/2017 04:12 PM    PHYSICIAN: Altamese Dilling T. Suzan Slick, MD  Signed: 01/21/2017 06:02 AM

## 2017-01-21 NOTE — Progress Notes (Signed)
Review this week at appt

## 2017-01-21 NOTE — Progress Notes (Signed)
She is aware and on eliquis

## 2017-01-22 ENCOUNTER — Ambulatory Visit: Admit: 2017-01-22 | Payer: MEDICARE | Attending: Internal Medicine | Primary: Internal Medicine

## 2017-01-22 DIAGNOSIS — I82401 Acute embolism and thrombosis of unspecified deep veins of right lower extremity: Secondary | ICD-10-CM

## 2017-01-22 MED ORDER — APIXABAN 5 MG TABLET
5 mg | ORAL_TABLET | ORAL | 3 refills | Status: DC
Start: 2017-01-22 — End: 2017-02-07

## 2017-01-22 NOTE — Progress Notes (Signed)
HISTORY OF PRESENT ILLNESS  Jillian Bean is a 76 y.o. female.  HPI  Seen for first visit after diagnosis of acute right DVT.  She has been hospitalized in April to have removal of infected right prosthetic joint.  She was then at [facility] for physical therapy. She has been non weightbearing since that time.  She had increased swelling in right leg and on June 8th saw me and was diagnosed with acute DVT.  She was started on Eliquis 10 mg b.i.d.for the first week, which she is tolerating.  She comes in now for further discussion. She's had no evidence of bleeding or bruising.  She is aware that after the first week she will transition to a 5 mg b.i.d. dose.  She is aware of risk factor primarily being immobilization with the failed knee.  She has numerous appropriate questions about how she will be able to have her subsequent planned knee replacement surgery.  Have discussed that could consider bridging Lovenox.  Discussed that she will need to stay on anticoagulation for at least three months.  Discussed risks of bleeding.  Discussed that timing of surgery certainly also depends on Dr. Duwayne Heck, her ID doctor, who is monitoring the infected joint space.  She has follow up with him later this week.  She's not having any GI symptoms.  She's not having significant pain.  She has only used Tylenol occasionally. She's not having chest pain or shortness of breath.      Review of Systems   Constitutional: Negative for chills, fever and weight loss.   Respiratory: Negative for cough, shortness of breath and wheezing.    Cardiovascular: Negative for chest pain, palpitations, orthopnea, leg swelling and PND.   Gastrointestinal: Negative for heartburn and nausea.   Musculoskeletal: Negative for falls, joint pain and myalgias.   Neurological: Negative for dizziness and headaches.   Endo/Heme/Allergies: Does not bruise/bleed easily.       Physical Exam   Constitutional: She is oriented to person, place, and time. She appears  well-developed and well-nourished.   HENT:   Head: Normocephalic and atraumatic.   Neck: Normal range of motion. Neck supple. Carotid bruit is not present. No thyromegaly present.   Cardiovascular: Normal rate, regular rhythm, S1 normal, S2 normal, normal heart sounds and intact distal pulses.    No murmur heard.  Pulmonary/Chest: Effort normal and breath sounds normal. No respiratory distress. She has no wheezes. She has no rales.   Abdominal: Soft. There is no tenderness.   Musculoskeletal: She exhibits no edema.   Right leg in immmobilizer   Neurological: She is alert and oriented to person, place, and time.   Psychiatric: She has a normal mood and affect. Her behavior is normal.   Nursing note and vitals reviewed.      ASSESSMENT and PLAN  Diagnoses and all orders for this visit:    1. Acute deep vein thrombosis (DVT) of right lower extremity, unspecified vein (HCC)  -     apixaban (ELIQUIS) 5 mg tablet; 1 po bid    2. Failed total right knee replacement, subsequent encounter    3. Other iron deficiency anemia    Encouraged appt with dr Laurena Bering as well to discuss dvt prophylaxis when she does go in for knee replacement  Encouraged her to call me if any excess bleeding or bruising  Cont eliquis for 3 mo

## 2017-01-28 ENCOUNTER — Ambulatory Visit: Admit: 2017-01-28 | Discharge: 2017-01-28 | Payer: MEDICARE | Attending: Specialist | Primary: Internal Medicine

## 2017-01-28 DIAGNOSIS — Z853 Personal history of malignant neoplasm of breast: Secondary | ICD-10-CM

## 2017-01-28 MED ORDER — ENOXAPARIN 60 MG/0.6 ML SUB-Q SYRINGE
60 mg/0.6 mL | INJECTION | Freq: Two times a day (BID) | SUBCUTANEOUS | 1 refills | Status: DC
Start: 2017-01-28 — End: 2017-03-22

## 2017-01-28 NOTE — Patient Instructions (Signed)
For surgery, stop Eliquis 2 days prior to surgery and switch to lovenox 1 mg/kg bid. Stop Lovenox the morning of surgery and resume after surgery once hemostasis is achieved. Switch back to Eliquis when ortho(Dr. Dobzyniak) feels she is not at risk for bleeding.

## 2017-01-28 NOTE — Progress Notes (Signed)
[facility]  [address], [place]   [address]  W: [phone]   F: [phone]      F/u HEME/ONC CONSULT    Reason for visit: evaluation for treatment for breast cancer    Consulting physician: Dr. Gilford Rile    HPI:   Jillian Bean is a 76 y.o.  female who I was asked to see in consultation at the request of Dr. Elie Confer for evaluation for systemic therapy for breast cancer.    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 Right lumpectomy on 11/10/14 shows IDC, 1.7 cm, gr 3, 0/5 LN, no LVI.  PT1cN0Mx.    [place]rowning q 3 weeks x 6: 12/21/14- 04/12/15  C#5 held 1 week on 03/15/15 due to thrombocytopenia   Outback Herceptin: 05/03/15-11/29/15    S/p XRT 06/03/15    Bone Marrow Bx 10/09/16: Variably normocellular marrow with trilineage hematopoiesis.    Interval History: In today for follow up. Complains of gr 1 constipation, gr 1 fatigue. She was recently diagnosed with a DVT in the right leg.     She has been receiving abx for a hardware infection in her right knee for 8 weeks.     06/20/16 hgb 11.5  07/25/16 hgb 8.7  01/07/17 Hgb 7.1  01/09/17 Hgb 9.3   01/15/17 Hgb 9.0     DX   Encounter Diagnoses   Name Primary?   ??? History of breast cancer Yes   ??? Anemia, normocytic normochromic    ??? Osteoporosis without current pathological fracture, unspecified osteoporosis type    ??? Normocytic anemia      Past Medical History:   Diagnosis Date   ??? Anemia    ??? Back pain 09/01/2007   ??? Cancer (HCC)     R breast   ??? Chronic pain     BACK PAIN   ??? Coagulation disorder ([place])     IRON DEFFICIENCY ANEMIA   ??? Colonic polyps 08/31/1998   ??? 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 Hiatal hernia 07/11/2011    Dr.sobieski   ??? Hypercholesteremia 09/01/2007   ??? Hypothyroidism 09/01/2003   ??? 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 Nausea & vomiting    ??? OA (osteoarthritis) 09/01/2007   ??? Other ill-defined conditions(799.89)     VERTIGO   ??? Plantar fasciitis 09/01/2007   ??? S/P colonoscopy 10/10/2007   ??? Thyroid disease      Past Surgical History:   Procedure Laterality Date   ??? BREAST SURGERY PROCEDURE UNLISTED  1993    Breast Reduction   ??? COLONOSCOPY N/A 11/12/2016    COLONOSCOPY performed by Burnett Harry, MD at [place]   ??? COLONOSCOPY,DIAGNOSTIC  11/12/2016        ??? ENDOSCOPY, COLON, DIAGNOSTIC  7/12/18/08    7/05 Dr Maudry Diego   ??? HX BREAST LUMPECTOMY Right 2016   ??? HX COLONOSCOPY  2014    Dr Illene Silver   ??? HX GI      COLONOSCOPY   ??? HX GYN      Hysterectomy in her 78's   ??? HX HEENT  04/07/10    thyroid biopsy neg dr Leward Quan   ??? HX HEENT  wisdom teeth extraction   ??? HX ORTHOPAEDIC  08/22/09    left TKR   ??? HX ORTHOPAEDIC  08/13/12    right tkr   ??? HX ORTHOPAEDIC Right 11/26/2016    cleaned infection in joint   ??? HX OTHER SURGICAL  2013    MINIMALLY INVASIVE LUMBAR DECOMPRESSION   ??? UPPER GI ENDOSCOPY,BIOPSY  11/12/2016          Social History     Social History   ??? Marital status: MARRIED     Spouse name: N/A   ??? Number of children: N/A   ??? Years of education: N/A     Social History Main Topics   ??? Smoking status: Never Smoker   ??? Smokeless tobacco: Never Used   ??? Alcohol use No   ??? Drug use: No   ??? Sexual activity: Not Currently     Other Topics Concern   ??? None     Social History Narrative     Family History   Problem Relation Age of Onset   ??? Cancer Mother      Breast   ??? Heart Failure Father      MI   ??? Seizures Brother      ms   ??? Anesth Problems Neg Hx        Current Outpatient Prescriptions   Medication Sig Dispense Refill   ??? enoxaparin (LOVENOX) 60 mg/0.6 mL injection 60 mg by SubCUTAneous route every twelve (12) hours. 30 Syringe 1   ??? apixaban (ELIQUIS) 5 mg tablet 1 po bid 60 Tab 3   ??? aspirin delayed-release 81 mg tablet Take  by mouth daily.      ??? cephALEXin (KEFLEX) 500 mg capsule TK 1 C PO QID  0   ??? OTHER      ??? brimonidine-timolol (COMBIGAN) 0.2-0.5 % drop ophthalmic solution Administer 1 Drop to both eyes every twelve (12) hours.     ??? raNITIdine (ZANTAC) 150 mg tablet Take 1 Tab by mouth two (2) times a day. 60 Tab 5   ??? alendronate (FOSAMAX) 70 mg tablet Take 1 Tab by mouth every seven (7) days. 4 Tab 11   ??? colesevelam (WELCHOL) 625 mg tablet 1 po bid 180 Tab 2   ??? levothyroxine (SYNTHROID) 112 mcg tablet 8 per week 100 Tab 3   ??? docusate sodium (COLACE) 100 mg capsule Take 100 mg by mouth daily.     ??? multivitamin (ONE A DAY) tablet Take 1 Tab by mouth daily.       Allergies   Allergen Reactions   ??? Atorvastatin Myalgia   ??? Zocor [Simvastatin] Other (comments) and Diarrhea     Other reaction(s): Adverse reaction to substance   Myalgias     ??? Celebrex [Celecoxib] Other (comments)     Aggitation   ??? Gabapentin Other (comments)     FELT UNSTEADY ON MY FEET   ??? Levaquin [Levofloxacin] Palpitations   ??? Nexium [Esomeprazole Magnesium] Diarrhea   ??? Other Medication Swelling     Latanoprost drops   ??? Oxycontin [Oxycodone] Itching   ??? Pravastatin Nausea and Vomiting   ??? Statins-Hmg-Coa Reductase Inhibitors Other (comments)     lymphacytic colitis     ??? Yellow Dye Hives       Review of Systems    A comprehensive review of systems was performed and all systems were negative except for HPI and for the symptom report form, reviewed and scanned in.    Objective:  Physical Exam:  Visit Vitals   ??? BP 109/60 (BP 1 Location: Right arm, BP Patient Position: Sitting)   ??? Pulse 78   ??? Temp 96 ??F (35.6 ??C) (Temporal)   ??? Resp 20   ??? Ht '5\' 5"'  (1.651 m)   ??? LMP 04/13/2010   ??? SpO2 97%       General:  Alert, cooperative, no distress, appears stated age.   Head:  Normocephalic, without obvious abnormality, atraumatic.   Eyes:  Conjunctivae/corneas clear. PERRL, EOMs intact.   Throat: Lips, mucosa, and tongue normal.     Neck: Supple, symmetrical, trachea midline, no adenopathy, thyroid: no enlargement/tenderness/nodules   Back:   Symmetric, no curvature. ROM normal. No CVA tenderness.   Lungs:   Clear to auscultation bilaterally.   Chest wall:  No tenderness or deformity.   Heart:  Regular rate and rhythm, S1, S2 normal, no murmur, click, rub or gallop.   Breast Exam:  S/p R lumpectomy.    Abdomen:   Soft, non-tender. Bowel sounds normal. No masses,  No organomegaly.   Extremities: Right leg 2+ edema, brace present   Skin: Improved macular rash on elbows, back, abdomen lower, upper thighs, back of legs   Lymph nodes: Cervical, supraclavicular, and axillary nodes normal.   Neurologic: CNII-XII intact.     Diagnostic Imaging     12/16/14 TTE: EF 71%.  03/02/15 TTE: EF 68%  05/30/15 TTE :  EF 65%  07/21/15 TTE: EF 66%  10/06/15 TTE: EF 62%  05/23/16 TTE EF 60%    03/15/16 dexa  Findings:  ????  Fractures identified on Lateral scanogram:  None  ????  Femoral Neck:  Right  Bone mineral density (gm/cm2):? 0.649  % of peak bone mass: 63  % for age matched controls:? 86  T-score: -2.8  Z-score: -0.8  ????  Total Hip: Right  Bone mineral density (gm/cm2):  0.761  % of peak bone mass:   76  % for age matched controls:  98  T-score:   -2.0  Z-score:  -0.1  ??  Lumbar Spine:  L1-L4  Bone mineral density (gm/cm2):  1.211  % of peak bone mass:  101   % for age matched controls:  124  T-score:  0.1  Z-score:  2.0  ??  The T score for the left distal third radius is -1.4.  ????  IMPRESSION  Impression:  ????  This patient is osteoporotic using the World Health Organization criteria  As compared to the prior study, there has been no change in the bone mineral  density of the right total hip and an increase in the bone mineral density of  the lumbar spine of 2.0%.  10 year probability of major osteoporotic fracture:  19.6%  10 year probability of hip fracture:  7.4%    09/20/16 CT c/a/p:  IMPRESSION:  No acute process in the chest, abdomen, and pelvis.  ??     Lab Results  Lab Results   Component Value Date/Time    WBC 4.5 01/18/2017 11:31 AM    HGB 9.1 (L) 01/18/2017 11:31 AM    HCT 29.4 (L) 01/18/2017 11:31 AM    PLATELET 228 01/18/2017 11:31 AM    MCV 91 01/18/2017 11:31 AM     Lab Results   Component Value Date/Time    Sodium 141 11/30/2016 05:10 AM    Potassium 3.2 (L) 11/30/2016 05:10 AM    Chloride 108 11/30/2016 05:10 AM    CO2 27 11/30/2016  05:10 AM    Anion gap 6 11/30/2016 05:10 AM    Glucose 100 11/30/2016 05:10 AM    BUN 7 11/30/2016 05:10 AM    Creatinine 0.68 11/30/2016 05:10 AM    BUN/Creatinine ratio 10 (L) 11/30/2016 05:10 AM    GFR est AA >60 11/30/2016 05:10 AM    GFR est non-AA >60 11/30/2016 05:10 AM    Calcium 8.4 (L) 11/30/2016 05:10 AM    AST (SGOT) 35 11/28/2016 05:25 AM    Alk. phosphatase 90 11/28/2016 05:25 AM    Protein, total 6.0 (L) 11/28/2016 05:25 AM    Albumin 2.4 (L) 11/28/2016 05:25 AM    Globulin 3.6 11/28/2016 05:25 AM    A-G Ratio 0.7 (L) 11/28/2016 05:25 AM    ALT (SGPT) 14 11/28/2016 05:25 AM       Lab Results   Component Value Date/Time    Reticulocyte count 1.5 09/05/2016 09:40 AM    Iron % saturation 22 07/30/2016 09:31 AM    TIBC 161 (L) 07/30/2016 09:31 AM    Ferritin 1982 (H) 07/30/2016 09:31 AM    Vitamin B12 542 07/30/2016 09:31 AM    Folate 14.8 07/30/2016 09:31 AM    Haptoglobin 412 (H) 09/05/2016 09:40 AM    LD 192 09/05/2016 09:40 AM    Sed rate, automated 67 (H) 11/28/2016 05:25 AM    C-Reactive protein 21.70 (H) 11/28/2016 05:25 AM    TSH 0.919 01/18/2017 11:31 AM     Lab Results   Component Value Date/Time    INR 1.0 11/21/2016 01:34 PM    aPTT 28.7 08/18/2009 01:05 PM       Assessment/Plan:  76 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  PS 0    1. Right Breast cancer stage: IA    Hormonal therapy: not indicated due to receptor (-) status    We explained to the patient that the goal of systemic adjuvant therapy is  to improve the chances for cure and decrease the risk of relapse. We explained why a patient can have microscopic cancer spread now even though physical examination, laboratory studies and imaging studies are negative for cancer. We explained that the same treatments used now as adjuvant or preventive treatments rarely if ever are curative in women who develop metastases.     No evidence of recurrence    Mammogram in March 2018 negative at [facility], due in March 2019    She declined to take neratinib.    2. Emotional well being: She has excellent support and is coping well with her disease.    3. FH of breast cancer: BRCA 1/2 [place] testing negative    4. Osteoporosis: On dexa from 2015; is now on fosamax from Dr. Wanda Plump, started 08/2016. DEXA 03/15/16 showed some improvement    5. Back pain low/radiculopathy: Dr. Dema Severin, pain specialist, was following her. NM bone scan and xr show degeneration. Steroid injection in of Octobert; L-spine MRI 01/19/16 shows DDD.  States last steroid injection 09/24/16.     6. Anemia: Hgb 9.0 on 01/15/17, improving. Normocytic; unclear as to drop since Nov, likely due to chemo as now is improving.  Elevated ferritin as well.  No B12 def, normal folate, normal iron studies.  Normal TSH    The differential diagnosis for a normocytic anemia is broad, and includes blood loss, anemia of chronic disease, chronic renal insufficiency, iron deficiency, hypothyroidism, hemolysis,  and bone marrow suppression. MDS, B12 deficiency, folate deficiency, and etoh abuse can also lead to anemia, though it is generally macrocytic.      Bone Marrow bx 10/02/16: normal. Referred to GI -- Dr. Alita Chyle, on 11/12/16 EGD showed atrophic gastritis and hiatal hernia and colonoscopy showed int hemorrhoids and diverticulosis.      Recently had knee infection/septic arthritis (staph) in April 2018 which resulted in removal of old knee replacement. Is seeing Dr. Janyth Pupa in [place] at [facility].     Anemia improving, but unclear cause.  Unlikely related to chemotherapy as this occurred a year from chemo.  May have been a episode of gastritis?  Previously recommended capsule study at some point with Dr. Alita Chyle. At this point she would prefer to hold off on this until she has her knee replacement in August.      Will repeat CBC in 3 months    7. R leg DVT: provoked, in non weight bearing leg s/p surgery. Currently on Eliquis 5 mg bid started on 01/22/17. Advised her to stop baby aspirin. Will plan to continue anticoagulation for at least 3 months, or while her leg is immobile.     I am ok with her proceeding with surgery. For surgery, stop Eliquis 2 days prior to surgery and switch to lovenox 1 mg/kg bid. Stop Lovenox the morning of surgery and resume after surgery once hemostasis is achieved. Switch back to Eliquis when ortho (Dr. Elvin So) feels she is not at risk for bleeding, I would recommend at discharge to home. lovenox 60 mg q12 rx given    Thank you for this consult.  All of the patient's questions were answered today.    Follow-up Disposition: Not on File    Sonda Rumble, MD

## 2017-01-28 NOTE — Progress Notes (Signed)
Jillian Bean is a 76 y.o. female follow up for breast cancer.

## 2017-02-01 NOTE — Telephone Encounter (Signed)
Pt called requesting a return call from [facility] concerning the cost of her medication. Call back number [phone]

## 2017-02-07 ENCOUNTER — Encounter

## 2017-02-07 MED ORDER — APIXABAN 5 MG TABLET
5 mg | ORAL_TABLET | Freq: Two times a day (BID) | ORAL | 1 refills | Status: DC
Start: 2017-02-07 — End: 2017-10-11

## 2017-02-07 NOTE — Telephone Encounter (Signed)
Called patient regarding cost of Lovenox. Patient reported that she already picked up her Lovenox at the pharmacy and it was $37. Explained that when she needs a refill of Eliquis, have Dr. Wanda Plump send the Rx to her mail order pharmacy and she can get her Eliquis for $10 as long as it is at least a 22 day supply. Patient verbalized understandin.g

## 2017-02-07 NOTE — Telephone Encounter (Signed)
Patient request refill going to [facility] instead of [facility] it is cheaper for her through [employer]

## 2017-02-15 ENCOUNTER — Inpatient Hospital Stay: Admit: 2017-02-15 | Payer: MEDICARE | Primary: Internal Medicine

## 2017-02-15 DIAGNOSIS — Z01818 Encounter for other preprocedural examination: Secondary | ICD-10-CM

## 2017-02-15 LAB — URINALYSIS W/ REFLEX CULTURE
Bacteria: NEGATIVE /hpf
Bilirubin: NEGATIVE
Blood: NEGATIVE
Glucose: NEGATIVE mg/dL
Ketone: NEGATIVE mg/dL
Nitrites: NEGATIVE
Protein: NEGATIVE mg/dL
Specific gravity: 1.009 (ref 1.003–1.030)
Urobilinogen: 0.2 EU/dL (ref 0.2–1.0)
pH (UA): 6.5 (ref 5.0–8.0)

## 2017-02-15 LAB — METABOLIC PANEL, BASIC
Anion gap: 7 mmol/L (ref 5–15)
BUN/Creatinine ratio: 18 (ref 12–20)
BUN: 13 MG/DL (ref 6–20)
CO2: 28 mmol/L (ref 21–32)
Calcium: 8.7 MG/DL (ref 8.5–10.1)
Chloride: 105 mmol/L (ref 97–108)
Creatinine: 0.71 MG/DL (ref 0.55–1.02)
GFR est AA: >60 mL/min/{1.73_m2} (ref >60)
GFR est non-AA: >60 mL/min/{1.73_m2} (ref >60)
Glucose: 79 mg/dL (ref 65–100)
Potassium: 3.9 mmol/L (ref 3.5–5.1)
Sodium: 140 mmol/L (ref 136–145)

## 2017-02-15 LAB — CBC W/O DIFF
ABSOLUTE NRBC: 0 10*3/uL (ref 0.00–0.01)
HCT: 33.7 % — ABNORMAL LOW (ref 35.0–47.0)
HGB: 10.6 g/dL — ABNORMAL LOW (ref 11.5–16.0)
MCH: 29.3 PG (ref 26.0–34.0)
MCHC: 31.5 g/dL (ref 30.0–36.5)
MCV: 93.1 FL (ref 80.0–99.0)
MPV: 9.4 FL (ref 8.9–12.9)
NRBC: 0 PER 100 WBC
PLATELET: 193 10*3/uL (ref 150–400)
RBC: 3.62 M/uL — ABNORMAL LOW (ref 3.80–5.20)
RDW: 13.1 % (ref 11.5–14.5)
WBC: 4.1 10*3/uL (ref 3.6–11.0)

## 2017-02-15 LAB — PROTHROMBIN TIME + INR
INR: 1 (ref 0.9–1.1)
Prothrombin time: 10.3 s (ref 9.0–11.1)

## 2017-02-15 LAB — HEMOGLOBIN A1C WITH EAG: Hemoglobin A1c: 4.2 % (ref 4.2–6.3)

## 2017-02-15 NOTE — Other (Signed)
Preoperative instructions reviewed with patient. Patient given six pack of CHG wipes. Instructions reviewed on use of CHG wipes. Patient given SSI infection FAQ sheet. MRSA/MSSA treatment instruction sheet given with an explanation to patient that they will be notified if treatment instructions need to be initiated. Patient was given opportunity to ask questions on the information provided.

## 2017-02-16 LAB — EKG, 12 LEAD, INITIAL
Atrial Rate: 64 {beats}/min
Calculated P Axis: 54 degrees
Calculated R Axis: -14 degrees
Calculated T Axis: 19 degrees
Diagnosis: NORMAL
P-R Interval: 180 ms
Q-T Interval: 412 ms
QRS Duration: 86 ms
QTC Calculation (Bezet): 425 ms
Ventricular Rate: 64 {beats}/min

## 2017-02-16 LAB — CULTURE, MRSA

## 2017-02-16 LAB — TYPE & SCREEN
ABO/Rh(D): O POS
Antibody screen: POSITIVE

## 2017-02-16 LAB — EKG 12-LEAD
Atrial Rate: 64 {beats}/min
Diagnosis: NORMAL
P Axis: 54 degrees
P-R Interval: 180 ms
Q-T Interval: 412 ms
QRS Duration: 86 ms
QTc Calculation (Bazett): 425 ms
R Axis: -14 degrees
T Axis: 19 degrees
Ventricular Rate: 64 {beats}/min

## 2017-02-16 LAB — TYPE AND SCREEN
ABO/Rh: O POS
Antibody Screen: POSITIVE

## 2017-03-01 ENCOUNTER — Ambulatory Visit: Admit: 2017-03-01 | Payer: MEDICARE | Attending: Nurse Practitioner | Primary: Internal Medicine

## 2017-03-01 DIAGNOSIS — T84012D Broken internal right knee prosthesis, subsequent encounter: Secondary | ICD-10-CM

## 2017-03-01 NOTE — Patient Instructions (Addendum)
Learning About Total Knee Replacement Surgery  What is a total knee replacement?    A total knee replacement replaces the worn ends of the thighbone (femur) and the lower leg bone (tibia) where they meet at the knee. Sometimes the surface of the patella (kneecap) is replaced too. You may want this surgery if you have knee pain, stiffness, swelling, or problems moving your knee that you cannot treat in other ways. For most people, these problems are caused by arthritis. They can also be caused by a knee injury.  If you need to have both knees replaced, you may have both surgeries at the same time. Or your doctor may recommend doing one knee at a time. Your doctor would replace the second knee after you recover from the first knee surgery. Recovery after a double knee replacement takes longer than after a single replacement.  How is a total knee replacement done?  Before surgery, you will get medicine to make you sleep or feel relaxed. If you will be awake during surgery, you will also get a shot of medicine into your spine to make your legs numb.  Your doctor makes a 4- to 10-inch cut, called an incision, on the front of your knee. Your doctor then:  ?? Replaces the damaged part of your femur with a metal piece.  ?? Replaces the damaged part of your tibia with a metal piece and plastic surface.  ?? May replace part of your kneecap with plastic.  The doctor finishes the surgery by closing your incision with stitches, staples, tissue glue, or tape strips. If stitches or staples are used, they are removed 10 to 21 days after surgery. Glue or tape strips will fall off on their own over time. The incision will leave a scar that fades with time.  There are two types of replacement joints. They are:  ?? Cemented joints. The cement acts as glue, attaching the new joint to the bone.  ?? Uncemented joints. These have a metal coating with many small openings. Over time, new bone grows and fills up the openings. This new bone  attaches the joint to the bone.  Your doctor may also use a combination of cemented and uncemented parts. Talk to your doctor about the best type of knee joint for you.  Knee replacement surgery may take about 2 to 3 hours.  What can you expect after a total knee replacement?  An artificial knee will allow you to do normal daily activities with little or no pain. You may be able to hike, dance, ride a bike, and play golf. Talk to your doctor about whether you can do more strenuous activities.  You will need to do months of physical rehabilitation (rehab) after a knee replacement. Rehab will help you strengthen the muscles of the knee and regain movement in your knee.  Your doctor will let you know if you will stay in the hospital or if you can go home the day of surgery. If you have both knees done at the same time, you may need to be in the hospital for a few days. Your rehabilitation program (rehab) will start before you leave the hospital. When you go home, you will be able to move around with crutches or a walker. But you will need someone to help you at home for the next few weeks until your energy level returns and you can get around more easily. If there is no one to help you at home, you may   go to a rehabilitation center.  Your knee will be swollen and hurt when you move it. You will need to take pain medicine for a time after surgery. You will start to walk with a walker or crutches the day after surgery. You will also begin doing simple leg exercises. You will probably need about 6 to 12 weeks before you can get back to your job. Your knee may be sore for up to 3 months.  The rehab after a knee replacement takes a lot of time and effort. You will have to stretch and do exercises daily to strengthen your knee and regain movement. The goal of rehab is to bend the knee at a 90-degree angle, which is like the letter "L." But most people who complete all of the physical therapy do better than that.   You need to remain active after you finish rehab to keep your new knee flexible and strong. You should be able to walk, swim, golf, dance, and ride a bicycle on flat ground. But you may not be able to run, ski, or ride a bicycle on hilly ground.  Follow-up care is a key part of your treatment and safety. Be sure to make and go to all appointments, and call your doctor if you are having problems. It's also a good idea to know your test results and keep a list of the medicines you take.  Where can you learn more?  Go to [email or web address].  Enter [phone] in the search box to learn more about "Learning About Total Knee Replacement Surgery."  Current as of: July 11, 2016  Content Version: 11.7  ?? 2006-2018 [employer]. Care instructions adapted under license by [employer] (which disclaims liability or warranty for this information). If you have questions about a medical condition or this instruction, always ask your healthcare professional. [place] any warranty or liability for your use of this information.

## 2017-03-01 NOTE — Progress Notes (Signed)
HISTORY OF PRESENT ILLNESS  Jillian Bean is a 76 y.o. female.  HPI  Jillian Bean was referred for evaluation by: Dr Zollie Beckers for Pre- Op Evaluation.  Please see encounter details and orders for consultative summary.    Type of surgery : Right Total Knee Arthroplasty Revision  Surgery site : Right knee  Anesthesia type: General   Date of procedure: 03/18/2017    Allergies:   Allergies   Allergen Reactions   ??? Atorvastatin Myalgia   ??? Zocor [Simvastatin] Other (comments) and Diarrhea     Other reaction(s): Adverse reaction to substance   Myalgias     ??? Celebrex [Celecoxib] Other (comments)     Aggitation   ??? Gabapentin Other (comments)     FELT UNSTEADY ON MY FEET   ??? Levaquin [Levofloxacin] Palpitations   ??? Nexium [Esomeprazole Magnesium] Diarrhea   ??? Other Medication Swelling     Latanoprost drops   ??? Oxycontin [Oxycodone] Itching   ??? Pravastatin Nausea and Vomiting   ??? Statins-Hmg-Coa Reductase Inhibitors Other (comments)     lymphacytic colitis     ??? Yellow Dye Hives     Family History   Problem Relation Age of Onset   ??? Cancer Mother      Breast   ??? Hypertension Mother    ??? Heart Failure Father      MI   ??? Heart Attack Father    ??? Hypertension Father    ??? MS Brother    ??? Anesth Problems Neg Hx      Latex allergy: no  Prior reactions to anesthesia:  Has had Nausea with General anesthesia in past.    Functional status:  she is unable to ambulate up flights of step due to right knee spacer but has been able to walk on flat surfaces with walker without shortness of breath, chest pain  Prior cardiac evaluation:   None  Denies history of sleep apnea; mild snoring.     Diagnosed with Right lower leg DVT in June 2018 and is on Eliquis 5 mg BID; has been advised by Hematology Dr Talbert Nan regarding Lovenox bridge and has been instructed to stop Eliquis 2 days prior to surgery and take Lovenox BID instead.    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  Has been on extended course of antibiotics over the past several months.    Has had normocytic anemia and had normal EGD and colonoscopy; appears to be improving but cause has been unclear. Thought perhaps related to chemotherapy for right breast cancer.  ??    Current Outpatient Prescriptions   Medication Sig   ??? apixaban (ELIQUIS) 5 mg tablet Take 1 Tab by mouth two (2) times a day.   ??? brimonidine-timolol (COMBIGAN) 0.2-0.5 % drop ophthalmic solution Administer 1 Drop to both eyes every twelve (12) hours.   ??? raNITIdine (ZANTAC) 150 mg tablet Take 1 Tab by mouth two (2) times a day.   ??? colesevelam (WELCHOL) 625 mg tablet 1 po bid   ??? levothyroxine (SYNTHROID) 112 mcg tablet 8 per week (Patient taking differently: 8 per week  Indications: 1 TAB DAILY AND 2 TABS ON THURSDAY.)   ??? multivitamin (ONE A DAY) tablet Take 1 Tab by mouth daily.   ??? acetaminophen (  TYLENOL EXTRA STRENGTH) 500 mg tablet Take 500 mg by mouth every six (6) hours as needed for Pain.   ??? polyethylene glycol (MIRALAX) 17 gram packet Take 17 g by mouth as needed.   ??? enoxaparin (LOVENOX) 60 mg/0.6 mL injection 60 mg by SubCUTAneous route every twelve (12) hours.   ??? docusate sodium (COLACE) 100 mg capsule Take 100 mg by mouth daily.     No current facility-administered medications for this visit.      Past Medical History:   Diagnosis Date   ??? Anemia    ??? Back pain 09/01/2007   ??? Cancer (HCC)     R breast   ??? Chronic pain     BACK PAIN   ??? Coagulation disorder ([place])     IRON DEFFICIENCY ANEMIA   ??? Colonic polyps 08/31/1998   ??? 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 DVT (deep venous thrombosis) ([place]) 01/2017   ??? Hiatal hernia 07/11/2011    Dr.sobieski   ??? Hypercholesteremia 09/01/2007    ??? Hypothyroidism 09/01/2003   ??? 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 Nausea & vomiting    ??? OA (osteoarthritis) 09/01/2007   ??? Other ill-defined conditions(799.89)     VERTIGO   ??? Plantar fasciitis 09/01/2007   ??? S/P colonoscopy 10/10/2007   ??? Thromboembolus ([place]) 01/2017    RIGHT LEG   ??? Thyroid disease      Past Surgical History:   Procedure Laterality Date   ??? COLONOSCOPY N/A 11/12/2016    COLONOSCOPY performed by Burnett Harry, MD at [facility]   ??? COLONOSCOPY,DIAGNOSTIC  11/12/2016        ??? ENDOSCOPY, COLON, DIAGNOSTIC  7/12/18/08    7/05 Dr Maudry Diego   ??? HX BREAST LUMPECTOMY Right 2016   ??? HX BREAST REDUCTION  1993   ??? HX COLONOSCOPY  2014    Dr Illene Silver   ??? HX GI      COLONOSCOPY   ??? HX HEENT  04/07/10    thyroid biopsy neg dr Leward Quan   ??? HX HEENT      wisdom teeth extraction   ??? HX HYSTERECTOMY  1975    PARTIAL   ??? HX KNEE REPLACEMENT Left 08/22/09   ??? HX KNEE REPLACEMENT Right 08/13/12   ??? HX ORTHOPAEDIC Right 11/26/2016    cleaned infection in joint   ??? HX OTHER SURGICAL  2013    MINIMALLY INVASIVE LUMBAR DECOMPRESSION   ??? NEUROLOGICAL PROCEDURE UNLISTED  2013    LUMBAR DECOMPRESSION   ??? UPPER GI ENDOSCOPY,BIOPSY  11/12/2016          Social History   Substance Use Topics   ??? Smoking status: Never Smoker   ??? Smokeless tobacco: Never Used   ??? Alcohol use No       Blood pressure 123/49, pulse 69, temperature 98.9 ??F (37.2 ??C), temperature source Oral, resp. rate 12, height 5\' 5"  (1.651 m), weight 135 lb (61.2 kg), last menstrual period 04/13/2010, SpO2 97 %.    Review of Systems   Constitutional: Negative for chills, fever and malaise/fatigue.   HENT: Negative for congestion, ear pain, sinus pain and sore throat.    Respiratory: Negative for cough, shortness of breath and wheezing.    Cardiovascular: Negative for chest pain and palpitations.   Gastrointestinal: Negative for abdominal pain, constipation, diarrhea, nausea and vomiting.   Genitourinary: Negative for  dysuria and frequency.    Musculoskeletal: Positive for joint pain (right knee). Negative for myalgias.   Skin: Negative for rash.   Neurological: Negative for dizziness, tingling and headaches.   Psychiatric/Behavioral: Negative for depression. The patient is not nervous/anxious.        Physical Exam   Constitutional: She is oriented to person, place, and time. She appears well-developed and well-nourished.   HENT:   Head: Normocephalic and atraumatic.   Right Ear: External ear normal.   Left Ear: External ear normal.   Nose: Nose normal.   Mouth/Throat: Oropharynx is clear and moist.   Eyes: Conjunctivae are normal.   Neck: Normal range of motion. Neck supple. No thyromegaly present.   Cardiovascular: Normal rate, regular rhythm and normal heart sounds.    Pulmonary/Chest: Effort normal and breath sounds normal. She has no wheezes. She has no rales.   Abdominal: Soft. Bowel sounds are normal. There is no tenderness. There is no rebound.   Musculoskeletal:        Right knee: She exhibits decreased range of motion.   Right knee in immobilizing brace; trace edema to right ankle/foot   Lymphadenopathy:     She has no cervical adenopathy.   Neurological: She is alert and oriented to person, place, and time. No cranial nerve deficit.   Skin: Skin is warm and dry.   Psychiatric: She has a normal mood and affect. Her behavior is normal.   Nursing note and vitals reviewed.      ASSESSMENT and PLAN  Diagnoses and all orders for this visit:    1. Failed total right knee replacement, subsequent encounter    2. Preop general physical exam -- considered medically stable for upcoming Right total knee arthroplasty revision provided she is cleared by ID.    3. Acute deep vein thrombosis (DVT) of distal vein of right lower extremity ([place])-- has been advised by Hematology to hold Eliquis for 2 days prior to surgery and bridge with Lovenox BID; instructed patient and husband on proper administration of Lovenox injection.     4. Normocytic anemia -- currently stable    5. Malignant neoplasm of right female breast, unspecified estrogen receptor status, unspecified site of breast ([place])    6. Acquired hypothyroidism --stable      lab results and schedule of future lab studies reviewed with patient  reviewed diet, exercise and weight control  reviewed medications and side effects in detail

## 2017-03-05 ENCOUNTER — Encounter

## 2017-03-05 MED ORDER — LEVOTHYROXINE 112 MCG TAB
112 mcg | ORAL_TABLET | ORAL | 3 refills | Status: DC
Start: 2017-03-05 — End: 2017-04-14

## 2017-03-05 MED ORDER — COLESEVELAM 625 MG TAB
625 mg | ORAL_TABLET | Freq: Two times a day (BID) | ORAL | 2 refills | Status: DC
Start: 2017-03-05 — End: 2017-10-11

## 2017-03-11 NOTE — Telephone Encounter (Signed)
From: Duane Lope  To: [name], MD  Sent: 03/08/2017 4:50 PM EDT  Subject:  Prescription Question    On  03/05/17 a refill for Welchol was requested. I normally get the brand name. This time I received the generic Colesevelam hydrochlori De tab 625 mg. I called [facility] mail service and was told that the prescription said the generic. Just want to confirm if I should now be taking the generic. If not, I was told that the prescription should say brand only - no substitution.    Thank  you,  [name]  Pearline Cables

## 2017-03-14 NOTE — H&P (Signed)
Jillian Bean  Location: [facility]  Patient #: [id number]  DOB: [birth year]  Married / Language: English / Race: White  Female      History of Present Illness   The patient is a 76 year old female who presents with a complaint of right knee pain. Patient presents today to discuss a few things prior to her upcoming surgery. She is currently scheduled to undergo revision surgery in July. Dr. Duwayne Heck would like her to wait until the first week in August given he extended her antibiotic treatment. He is ordering more lab work on 03/04/17. If this is okay she will proceed with surgery. She also would like to discuss her blood thinner medication as she had a recent DVT. She was recently prescribed Eliquis. Her oncologist would like to bridge with Lovenox and has prescribed this.      Problem List/Past Medical   S/P revision of total knee, right (V43.65   Z96.651) ??  REVIEW OF SYSTEMS: Systems were reviewed by the provider. ??  STENOSIS (724.02) ??  Dietary counseling (V65.3) ??  PAIN, KNEE (719.46) ??  OA, KNEE (715.96) ??  DDD (722.52) ??  PAIN, HIP (719.45) ??  Failed total right knee replacement, subsequent encounter (V58.89   T84.012D) ??  Low back pain (724.2) (724.2   M54.5) ??  Greater trochanteric bursitis of right hip (726.5   M70.61) ??    Allergies   Levaquin *FLUOROQUINOLONES* ??  Gabapentin *ANTICONVULSANTS* ??  CeleBREX *ANALGESICS - ANTI-INFLAMMATORY* ??  NexIUM *ULCER DRUGS* ??  OxyCODONE HCl (Abuse Deter) *ANALGESICS - OPIOID* ??  Statins ??  Zocor *ANTIHYPERLIPIDEMICS* ??  Allergies Reconciled ??    Family History   Heart disease in female family member before age 49 ??  Arthritis ??  Hypertension ??  Breast cancer ??    Social History   Tobacco use ?? Never smoker.  Exercise ?? 07/26/2011: Does aerobics.  Caffeine use ?? 07/26/2011: Drinks caffeinated beverages 1-2 times a day.  HIV risk factors ?? 07/26/2011: no  Seat Belt Use ?? 99/83/3825: always  Illicit drug use ?? 05/39/7673: none   Alcohol use ?? 07/26/2011: Never drinks.  Tobacco / smoke exposure ?? 07/26/2011: No  Sun Exposure ?? 07/26/2011: occasionally    Medication History   Amoxicillin?? (500MG  Tablet, 4 (four) Tablet Oral Take 4 tablets 1 hour prior to dental procedure, Taken starting 03/29/2014) Active.  Ciprofloxacin HCl?? (500MG  Tablet, 1 (one) Tablet Oral two times daily, Taken starting 11/23/2016) Active.  Macrobid?? (100MG  Capsule, 1 (one) Capsule Oral twice a day, Taken starting 11/23/2016) Active.  Synthroid?? (112MCG Tablet, Oral) Active.  Multiple Vitamin?? (1 Oral) Active.  Latanoprost?? (0.005% Solution, Ophthalmic) Active.  Diclofenac Potassium?? (50MG  Tablet, Oral) Active.  Aspirin?? (81MG  Tablet, Oral) Active.  Medications Reconciled??    Past Surgical History  Total Knee Replacement ?? left  Mammoplasty; Reduction ?? bilateral  Hysterectomy ?? non-cancerous: partial  Arthroscopy of Knee ?? left  Other Joint Surgery ??    Other Problems  Allergic Urticaria ??  Arthritis ??  Anemia ??  Hypercholesterolemia ??  Thyroid Disease ??  Unspecified Diagnosis ??  Colitis (558.9) ??        Review of Systems   General Not Present- Chills and Fatigue.  Skin Not Present- Bruising, Pallor and Skin Color Changes.  Respiratory Not Present- Cough and Difficulty Breathing.  Cardiovascular Not Present- Chest Pain, Fainting / Blacking Out and Rapid Heart Rate.  Musculoskeletal Not Present- Decreased Range of Motion, Joint  Pain and Joint Swelling.  Neurological Not Present- Dysesthesia, Paresthesias and Weakness In Extremities.  Hematology Not Present- Abnormal Bleeding, Blood Clots and Petechiae.    Vitals   Weight: 143 lb???? Height: 65??in??  Weight was reported by patient.  Height was reported by patient.  Body Surface Area: 1.72 m?????? Body Mass Index: 23.8 kg/m?? ??              Physical Exam  Musculoskeletal  Global Assessment  Examination of related systems reveals - well-developed, well-nourished,  in no acute distress, alert and oriented x 3 and normal coordination. Right Lower Extremity - Note: Right knee was examined. Incision is well-healed. Mild swelling around the knee; warm to touch. No erythema or drainge. Dependent lower extremity pitting edema. Palpable dorsalis pedis pulse. Foot is well-perfused.        Assessment & Plan   Failed total right knee replacement, subsequent encounter (V58.89   T84.012D)  Impression: Patient's knee is benign-appearing. We discussed her new medications after having a DVT. We also discussed Dr. Williams Che recommendations and surgery date has been changed. Patient will keep up posted regarding any July blood work or changes in her health. All are in agreement for plan for hospital course. All questions were answered today. We will see the patient in August for her revision surgery.  Current Plans  Pt Education - How to access health information online: discussed with patient and provided information.  Pt Education - Scientist, clinical (histocompatibility and immunogenetics) were provided.: discussed with patient and provided information.  REVIEW OF SYSTEMS: Systems were reviewed by the provider.(V49.9)        Signed by [name], MD

## 2017-03-18 ENCOUNTER — Inpatient Hospital Stay
Admit: 2017-03-18 | Discharge: 2017-03-22 | Disposition: A | Discharge status: Home Health | Payer: MEDICARE | Attending: Specialist | Admitting: Specialist

## 2017-03-18 DIAGNOSIS — T84032A Mechanical loosening of internal right knee prosthetic joint, initial encounter: Secondary | ICD-10-CM

## 2017-03-18 LAB — GLUCOSE, POC: Glucose (POC): 107 mg/dL — ABNORMAL HIGH (ref 65–100)

## 2017-03-18 MED ORDER — MIDAZOLAM 1 MG/ML IJ SOLN
1 mg/mL | INTRAMUSCULAR | Status: DC | PRN
Start: 2017-03-18 — End: 2017-03-18
  Administered 2017-03-18: 17:00:00 via INTRAVENOUS

## 2017-03-18 MED ORDER — PROPOFOL 10 MG/ML IV EMUL
10 mg/mL | INTRAVENOUS | Status: DC | PRN
Start: 2017-03-18 — End: 2017-03-18
  Administered 2017-03-18 (×6): via INTRAVENOUS

## 2017-03-18 MED ORDER — FENTANYL CITRATE (PF) 50 MCG/ML IJ SOLN
50 mcg/mL | INTRAMUSCULAR | Status: AC
Start: 2017-03-18 — End: ?

## 2017-03-18 MED ORDER — DIPHENHYDRAMINE HCL 50 MG/ML IJ SOLN
50 mg/mL | INTRAMUSCULAR | Status: DC | PRN
Start: 2017-03-18 — End: 2017-03-18

## 2017-03-18 MED ORDER — LACTATED RINGERS IV
INTRAVENOUS | Status: DC
Start: 2017-03-18 — End: 2017-03-18

## 2017-03-18 MED ORDER — MIDAZOLAM 1 MG/ML IJ SOLN
1 mg/mL | INTRAMUSCULAR | Status: AC
Start: 2017-03-18 — End: ?

## 2017-03-18 MED ORDER — MORPHINE 10 MG/ML INJ SOLUTION
10 mg/ml | INTRAMUSCULAR | Status: DC | PRN
Start: 2017-03-18 — End: 2017-03-18

## 2017-03-18 MED ORDER — LEVOTHYROXINE 112 MCG TAB
112 mcg | Freq: Every day | ORAL | Status: DC
Start: 2017-03-18 — End: 2017-03-18

## 2017-03-18 MED ORDER — POLYETHYLENE GLYCOL 3350 17 GRAM (100 %) ORAL POWDER PACKET
17 gram | Freq: Every day | ORAL | Status: DC
Start: 2017-03-18 — End: 2017-03-22
  Administered 2017-03-19 – 2017-03-20 (×2): via ORAL

## 2017-03-18 MED ORDER — HYDROMORPHONE 2 MG TAB
2 mg | ORAL | Status: DC | PRN
Start: 2017-03-18 — End: 2017-03-21
  Administered 2017-03-18 – 2017-03-20 (×5): via ORAL

## 2017-03-18 MED ORDER — KETOROLAC TROMETHAMINE 30 MG/ML INJECTION
30 mg/mL (1 mL) | Freq: Four times a day (QID) | INTRAMUSCULAR | Status: DC
Start: 2017-03-18 — End: 2017-03-19
  Administered 2017-03-18 – 2017-03-19 (×3): via INTRAVENOUS

## 2017-03-18 MED ORDER — COLESEVELAM 625 MG TAB
625 mg | Freq: Two times a day (BID) | ORAL | Status: DC
Start: 2017-03-18 — End: 2017-03-22
  Administered 2017-03-18 – 2017-03-22 (×8): via ORAL

## 2017-03-18 MED ORDER — APIXABAN 5 MG TABLET
5 mg | Freq: Two times a day (BID) | ORAL | Status: DC
Start: 2017-03-18 — End: 2017-03-18

## 2017-03-18 MED ORDER — SODIUM CHLORIDE 0.9 % IV
INTRAVENOUS | Status: AC
Start: 2017-03-18 — End: 2017-03-19
  Administered 2017-03-19 (×2): via INTRAVENOUS

## 2017-03-18 MED ORDER — SENNOSIDES-DOCUSATE SODIUM 8.6 MG-50 MG TAB
Freq: Two times a day (BID) | ORAL | Status: DC
Start: 2017-03-18 — End: 2017-03-22
  Administered 2017-03-18 – 2017-03-22 (×8): via ORAL

## 2017-03-18 MED ORDER — HYDROMORPHONE 0.5 MG/0.5 ML SYRINGE
0.5 mg/ mL | INTRAMUSCULAR | Status: AC
Start: 2017-03-18 — End: ?

## 2017-03-18 MED ORDER — DIPHENHYDRAMINE HCL 50 MG/ML IJ SOLN
50 mg/mL | Freq: Four times a day (QID) | INTRAMUSCULAR | Status: AC | PRN
Start: 2017-03-18 — End: 2017-03-19

## 2017-03-18 MED ORDER — APIXABAN 2.5 MG TABLET
2.5 mg | Freq: Two times a day (BID) | ORAL | Status: DC
Start: 2017-03-18 — End: 2017-03-22
  Administered 2017-03-19 – 2017-03-22 (×7): via ORAL

## 2017-03-18 MED ORDER — ROPIVACAINE (PF) 5 MG/ML (0.5 %) INJECTION
5 mg/mL (0. %) | INTRAMUSCULAR | Status: AC
Start: 2017-03-18 — End: ?

## 2017-03-18 MED ORDER — FAMOTIDINE 20 MG TAB
20 mg | Freq: Two times a day (BID) | ORAL | Status: DC
Start: 2017-03-18 — End: 2017-03-22
  Administered 2017-03-18 – 2017-03-22 (×8): via ORAL

## 2017-03-18 MED ORDER — FENTANYL CITRATE (PF) 50 MCG/ML IJ SOLN
50 mcg/mL | INTRAMUSCULAR | Status: DC | PRN
Start: 2017-03-18 — End: 2017-03-18

## 2017-03-18 MED ORDER — ONDANSETRON (PF) 4 MG/2 ML INJECTION
4 mg/2 mL | INTRAMUSCULAR | Status: DC | PRN
Start: 2017-03-18 — End: 2017-03-18

## 2017-03-18 MED ORDER — CEFAZOLIN 2 GRAM/20 ML IN STERILE WATER INTRAVENOUS SYRINGE
2 gram/0 mL | Freq: Three times a day (TID) | INTRAVENOUS | Status: AC
Start: 2017-03-18 — End: 2017-03-19
  Administered 2017-03-19 (×2): via INTRAVENOUS

## 2017-03-18 MED ORDER — BUPIVACAINE LIPOSOME (PF) 266 MG/20 ML (13.3 MG/ML) SUSP, INFILTRATION
1.3 % (13.3 mg/mL) | Status: DC | PRN
Start: 2017-03-18 — End: 2017-03-18
  Administered 2017-03-18: 20:00:00

## 2017-03-18 MED ORDER — SODIUM CHLORIDE 0.9 % IJ SYRG
INTRAMUSCULAR | Status: DC | PRN
Start: 2017-03-18 — End: 2017-03-18

## 2017-03-18 MED ORDER — SODIUM CHLORIDE 0.9 % IV
INTRAVENOUS | Status: DC
Start: 2017-03-18 — End: 2017-03-18

## 2017-03-18 MED ORDER — LEVOTHYROXINE 112 MCG TAB
112 mcg | ORAL | Status: DC
Start: 2017-03-18 — End: 2017-03-22
  Administered 2017-03-19 – 2017-03-22 (×4): via ORAL

## 2017-03-18 MED ORDER — POLYETHYLENE GLYCOL 3350 17 GRAM (100 %) ORAL POWDER PACKET
17 gram | ORAL | Status: DC | PRN
Start: 2017-03-18 — End: 2017-03-22

## 2017-03-18 MED ORDER — .PHARMACY TO SUBSTITUTE PER PROTOCOL
Status: DC | PRN
Start: 2017-03-18 — End: 2017-03-18

## 2017-03-18 MED ORDER — FENTANYL CITRATE (PF) 50 MCG/ML IJ SOLN
50 mcg/mL | INTRAMUSCULAR | Status: DC | PRN
Start: 2017-03-18 — End: 2017-03-20

## 2017-03-18 MED ORDER — CEFAZOLIN 2 GRAM/20 ML IN STERILE WATER INTRAVENOUS SYRINGE
2 gram/0 mL | INTRAVENOUS | Status: AC
Start: 2017-03-18 — End: 2017-03-18
  Administered 2017-03-18: 18:00:00 via INTRAVENOUS

## 2017-03-18 MED ORDER — ONDANSETRON (PF) 4 MG/2 ML INJECTION
4 mg/2 mL | INTRAMUSCULAR | Status: DC | PRN
Start: 2017-03-18 — End: 2017-03-18
  Administered 2017-03-18: 19:00:00 via INTRAVENOUS

## 2017-03-18 MED ORDER — SODIUM CHLORIDE 0.9 % IJ SYRG
INTRAMUSCULAR | Status: DC | PRN
Start: 2017-03-18 — End: 2017-03-22

## 2017-03-18 MED ORDER — BRIMONIDINE 0.2 % EYE DROPS
0.2 % | Freq: Two times a day (BID) | OPHTHALMIC | Status: DC
Start: 2017-03-18 — End: 2017-03-22
  Administered 2017-03-21 (×2): via OPHTHALMIC

## 2017-03-18 MED ORDER — NALOXONE 0.4 MG/ML INJECTION
0.4 mg/mL | INTRAMUSCULAR | Status: DC | PRN
Start: 2017-03-18 — End: 2017-03-22

## 2017-03-18 MED ORDER — BUPIVACAINE LIPOSOME (PF) 266 MG/20 ML (13.3 MG/ML) SUSP, INFILTRATION
1.3 % (13.3 mg/mL) | Status: AC
Start: 2017-03-18 — End: ?

## 2017-03-18 MED ORDER — LIDOCAINE (PF) 10 MG/ML (1 %) IJ SOLN
10 mg/mL (1 %) | INTRAMUSCULAR | Status: DC | PRN
Start: 2017-03-18 — End: 2017-03-18

## 2017-03-18 MED ORDER — ACETAMINOPHEN 500 MG TAB
500 mg | Freq: Four times a day (QID) | ORAL | Status: DC
Start: 2017-03-18 — End: 2017-03-22
  Administered 2017-03-18 – 2017-03-22 (×15): via ORAL

## 2017-03-18 MED ORDER — BUPIVACAINE (PF) 0.5 % (5 MG/ML) IJ SOLN
0.5 % (5 mg/mL) | INTRAMUSCULAR | Status: DC | PRN
Start: 2017-03-18 — End: 2017-03-18
  Administered 2017-03-18: 18:00:00 via PERINEURAL

## 2017-03-18 MED ORDER — ONDANSETRON (PF) 4 MG/2 ML INJECTION
4 mg/2 mL | INTRAMUSCULAR | Status: AC | PRN
Start: 2017-03-18 — End: 2017-03-19

## 2017-03-18 MED ORDER — BISACODYL 10 MG RECTAL SUPPOSITORY
10 mg | Freq: Every day | RECTAL | Status: DC | PRN
Start: 2017-03-18 — End: 2017-03-22

## 2017-03-18 MED ORDER — TIMOLOL MALEATE 0.5 % EYE DROPS
0.5 % | Freq: Two times a day (BID) | OPHTHALMIC | Status: DC
Start: 2017-03-18 — End: 2017-03-22
  Administered 2017-03-19 – 2017-03-22 (×6): via OPHTHALMIC

## 2017-03-18 MED ORDER — MIDAZOLAM 1 MG/ML IJ SOLN
1 mg/mL | INTRAMUSCULAR | Status: DC | PRN
Start: 2017-03-18 — End: 2017-03-18

## 2017-03-18 MED ORDER — ACETAMINOPHEN 500 MG TAB
500 mg | Freq: Once | ORAL | Status: AC
Start: 2017-03-18 — End: 2017-03-18
  Administered 2017-03-18: 17:00:00 via ORAL

## 2017-03-18 MED ORDER — BUPIVACAINE (PF) 0.5 % (5 MG/ML) IJ SOLN
0.5 % (5 mg/mL) | INTRAMUSCULAR | Status: AC
Start: 2017-03-18 — End: ?

## 2017-03-18 MED ORDER — SODIUM CHLORIDE 0.9% BOLUS IV
0.9 % | Freq: Once | INTRAVENOUS | Status: AC
Start: 2017-03-18 — End: 2017-03-19
  Administered 2017-03-19: 08:00:00 via INTRAVENOUS

## 2017-03-18 MED ORDER — LACTATED RINGERS IV
INTRAVENOUS | Status: DC
Start: 2017-03-18 — End: 2017-03-18
  Administered 2017-03-18 (×2): via INTRAVENOUS

## 2017-03-18 MED ORDER — LEVOTHYROXINE 112 MCG TAB
112 mcg | ORAL | Status: DC
Start: 2017-03-18 — End: 2017-03-22

## 2017-03-18 MED ORDER — DEXAMETHASONE SODIUM PHOSPHATE 4 MG/ML IJ SOLN
4 mg/mL | INTRAMUSCULAR | Status: DC | PRN
Start: 2017-03-18 — End: 2017-03-18
  Administered 2017-03-18: 18:00:00 via INTRAVENOUS

## 2017-03-18 MED ORDER — PHENYLEPHRINE 10 MG/ML INJECTION
10 mg/mL | INTRAMUSCULAR | Status: DC | PRN
Start: 2017-03-18 — End: 2017-03-18
  Administered 2017-03-18 (×3): via INTRAVENOUS

## 2017-03-18 MED ORDER — SODIUM CHLORIDE 0.9 % IV
50000 unit | INTRAVENOUS | Status: DC | PRN
Start: 2017-03-18 — End: 2017-03-18
  Administered 2017-03-18: 19:00:00

## 2017-03-18 MED ORDER — PROPOFOL 10 MG/ML IV EMUL
10 mg/mL | INTRAVENOUS | Status: DC | PRN
Start: 2017-03-18 — End: 2017-03-18
  Administered 2017-03-18 (×2): via INTRAVENOUS

## 2017-03-18 MED ORDER — DOCUSATE SODIUM 100 MG CAP
100 mg | Freq: Every day | ORAL | Status: DC
Start: 2017-03-18 — End: 2017-03-18

## 2017-03-18 MED ORDER — SODIUM CHLORIDE 0.9 % IJ SYRG
Freq: Three times a day (TID) | INTRAMUSCULAR | Status: DC
Start: 2017-03-18 — End: 2017-03-18

## 2017-03-18 MED ORDER — FENTANYL CITRATE (PF) 50 MCG/ML IJ SOLN
50 mcg/mL | INTRAMUSCULAR | Status: DC | PRN
Start: 2017-03-18 — End: 2017-03-18
  Administered 2017-03-18: 17:00:00 via INTRAVENOUS

## 2017-03-18 MED ORDER — SODIUM CHLORIDE 0.9 % IJ SYRG
Freq: Three times a day (TID) | INTRAMUSCULAR | Status: DC
Start: 2017-03-18 — End: 2017-03-22
  Administered 2017-03-19 – 2017-03-22 (×8): via INTRAVENOUS

## 2017-03-18 MED ORDER — TRAMADOL 50 MG TAB
50 mg | Freq: Four times a day (QID) | ORAL | Status: DC | PRN
Start: 2017-03-18 — End: 2017-03-22
  Administered 2017-03-20 – 2017-03-22 (×5): via ORAL

## 2017-03-18 MED ORDER — MIDAZOLAM 1 MG/ML IJ SOLN
1 mg/mL | INTRAMUSCULAR | Status: DC | PRN
Start: 2017-03-18 — End: 2017-03-18
  Administered 2017-03-18 (×4): via INTRAVENOUS

## 2017-03-18 MED FILL — BRIMONIDINE 0.2 % EYE DROPS: 0.2 % | OPHTHALMIC | Qty: 5

## 2017-03-18 MED FILL — HYDROMORPHONE 2 MG TAB: 2 mg | ORAL | Qty: 1

## 2017-03-18 MED FILL — DOK PLUS 8.6 MG-50 MG TABLET: ORAL | Qty: 1

## 2017-03-18 MED FILL — LACTATED RINGERS IV: INTRAVENOUS | Qty: 1000

## 2017-03-18 MED FILL — KETOROLAC TROMETHAMINE 30 MG/ML INJECTION: 30 mg/mL (1 mL) | INTRAMUSCULAR | Qty: 1

## 2017-03-18 MED FILL — EXPAREL (PF) 1.3 % (13.3 MG/ML) SUSPENSION FOR LOCAL INFILTRATION: 1.3 % (13.3 mg/mL) | Qty: 20

## 2017-03-18 MED FILL — TIMOLOL MALEATE 0.5 % EYE DROPS: 0.5 % | OPHTHALMIC | Qty: 5

## 2017-03-18 MED FILL — ACETAMINOPHEN 500 MG TAB: 500 mg | ORAL | Qty: 2

## 2017-03-18 MED FILL — FENTANYL CITRATE (PF) 50 MCG/ML IJ SOLN: 50 mcg/mL | INTRAMUSCULAR | Qty: 2

## 2017-03-18 MED FILL — SODIUM CHLORIDE 0.9 % IV: INTRAVENOUS | Qty: 500

## 2017-03-18 MED FILL — WELCHOL 625 MG TABLET: 625 mg | ORAL | Qty: 1

## 2017-03-18 MED FILL — FAMOTIDINE 20 MG TAB: 20 mg | ORAL | Qty: 1

## 2017-03-18 MED FILL — SENSORCAINE-MPF 0.5 % (5 MG/ML) INJECTION SOLUTION: 0.5 % (5 mg/mL) | INTRAMUSCULAR | Qty: 10

## 2017-03-18 MED FILL — MIDAZOLAM 1 MG/ML IJ SOLN: 1 mg/mL | INTRAMUSCULAR | Qty: 5

## 2017-03-18 MED FILL — SODIUM CHLORIDE 0.9 % IV: INTRAVENOUS | Qty: 1000

## 2017-03-18 MED FILL — NAROPIN (PF) 5 MG/ML (0.5 %) INJECTION SOLUTION: 5 mg/mL (0. %) | INTRAMUSCULAR | Qty: 30

## 2017-03-18 MED FILL — DILAUDID (PF) 0.5 MG/0.5 ML INJECTION SYRINGE: 0.5 mg/ mL | INTRAMUSCULAR | Qty: 0.5

## 2017-03-18 MED FILL — CEFAZOLIN 2 GRAM/20 ML IN STERILE WATER INTRAVENOUS SYRINGE: 2 gram/0 mL | INTRAVENOUS | Qty: 20

## 2017-03-18 NOTE — Anesthesia Procedure Notes (Signed)
Peripheral Block    Start time: 03/18/2017 1:01 PM  End time: 03/18/2017 1:10 PM  Performed by: Nehemiah Massed  Authorized by: Nehemiah Massed       Pre-procedure:   Indications: at surgeon's request and post-op pain management    Preanesthetic Checklist: patient identified, risks and benefits discussed, site marked, timeout performed, anesthesia consent given and patient being monitored      Block Type:   Block Type:  Adductor canal  Laterality:  Right  Monitoring:  Standard ASA monitoring, responsive to questions, continuous pulse ox, frequent vital sign checks, heart rate and oxygen  Injection Technique:  Single shot  Procedures: ultrasound guided    Patient Position: supine  Prep: alcohol    Location:  Mid thigh  Needle Gauge:  21 G  Needle Localization:  Ultrasound guidance  Medication Injected:  0.5%  ropivacaine  Volume (mL):  20    Assessment:    Injection Assessment:  Incremental injection every 5 mL, local visualized surrounding nerve on ultrasound, negative aspiration for blood, no intravascular symptoms, ultrasound image on chart, no paresthesia and negative aspiration for CSF  Patient tolerance:  Patient tolerated the procedure well with no immediate complications

## 2017-03-18 NOTE — Anesthesia Procedure Notes (Signed)
Spinal Block    Performed by: Nehemiah Massed  Authorized by: Nehemiah Massed     Pre-procedure:  Indications: primary anesthetic          Assessment:      Spinal Block Procedure Note    Anesthetic procedure including post-op analgesic plan was discussed and all questions were answered. Pt. agrees to proceed.     Patient was assisted to the seated position in the O.R., a "time-out" was performed.  Monitors (ECG, NIBP & Sp02) and oxygen @ 2LPM via nasal cannula were applied.   Mild sedation was administered.  The back was prepped at the lumbar region with topical antiseptic and draped.   29mL 1% lidocaine was injected locally at the L3-4 interspace.  A 25 G pencil point spinal needle was placed @ the above noted interspace and advanced until CSF was obtained. No blood or paresthesia was noted.  Bupivacaine-MPF 12mg    was injected into the CSF.     Patient tolerated the procedure well. No apparent complications were noted.

## 2017-03-18 NOTE — Op Note (Signed)
[place]  MR#: [id number]  DOB: [birth year]  ACCOUNT #: [id number]   DATE OF SERVICE: 03/18/2017    PREOPERATIVE DIAGNOSIS:  Failed total knee replacement and previous right knee infection.    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      PROCEDURE PERFORMED:  Revision of both components.    SURGEON:  [name], MD    ASSISTANT:  Georges Mouse, PA    ANESTHESIA:  Spinal.    ESTIMATED BLOOD LOSS:  100 mL    DRAINS:  None.    COMPLICATIONS:  None.    SPECIMENS REMOVED:  None.      PREOPERATIVE ANTIBIOTIC:  Ancef 2 grams.    IMPLANTS:  Zimmer LCCK knee implant size E femur with bilateral +5 posterolateral augments, size 3 tibia with a small trabecular metal sleeve.    COUNTS:  Sponge, instrument, needle counts were correct at the end of the procedure.    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  She was resected, and a knee replacement was placed, temporary knee was used.  Patient was treated with IV antibiotics.  She presents back today for a revision of her failed knee replacement.    DESCRIPTION OF PROCEDURE:  Anesthetic was initiated.  Preoperative dose of antibiotic was given.  The right side was confirmed as the operative side, prepped and draped in the usual sterile fashion.  Limb was exsanguinated, and the tourniquet was inflated.  Used her old incision, and a medial parapatellar approach was made to the patient's knee.  Synovium was normal appearing.  A complete synovectomy was performed.  The knee was exposed with reestablishing of the medial and lateral gutters.  The knee was flexed.  Patella was subluxed.  The tibia was subluxed anteriorly, and the tibial component was removed.  The tibial component was well fixed.  Saws and osteotomes were used to remove the tibial component.  Due  to the tenuous nature of the patient's tibial bone, I exposed the fibular head posterolateral and between the fibular head and the tibia, placed a cerclage wire around the entire proximal tibia.  This was tightened down.  At this point, I began broaching the tibia, and a small trabecular metal sleeve trial was placed, prepared for a size 3 tibia with a 12 mm offset stem extension to get the best bony coverage.  Cleanup cut was made on the tibia to allow good seating of the tibial implant.  At this point, the medullary canal of the femur was entered, reamed the femoral canal and then placed a trial stem and over this trial stem made a distal femur cleanup cut just skimming the bone.  The flexion and extension gaps were then examined, and using a size E femoral cutting block, an anterior cleanup cut was made correcting the patient's femoral rotation.  Box cut was made prepared for the femoral component with +5 posterior augments both laterally and medially to correct the rotation.  The trial femoral component was placed, and the knee was then trialed for polyethylene thickness.  Flexion and extension gaps were equivalent and a 14 poly balanced the knee out well.  The soft tissues were reapproximated to ensure that the knee could be closed, and at this point, the trial components were all removed.  The bony  surfaces were lavaged and dried.  All debris was removed.  The tibial trabecular metal sleeve was impacted into place, and then through the sleeve, I cemented the tray and the stem.  Tibial cement was allowed to fully cure.  At this point mixed for the femur, cemented the femoral component into place.  After the cement fully cured, the real 14 LCCK polyethylene was placed.  The screw was tightened down, copiously irrigated the deep wound and released the tourniquet.  Hemostasis was obtained.  The arthrotomy was closed with heavy Vicryl sutures for a watertight closure both proximally and distally.  Skin and  subcutaneous were irrigated and closed in standard fashion.  Sterile dressing was applied.  Knee immobilizer was placed.  Patient tolerated the procedure well and was taken to the recovery room in stable condition.  No specimen was sent.    Procedure was revision of entire femoral and tibial component.  Both femoral and tibial components were well fixed prior to removal.  Georges Mouse, PA assisted for the procedure.  Zimmer LCCK implant was used.  Refer to brief operative note for all implant sizes.      [name], MD       MAD / HN  D: 03/19/2017 17:49     T: 03/20/2017 07:35  JOB #: [id number]

## 2017-03-18 NOTE — Other (Signed)
Patient: Jillian Bean MRN: [id number]  SSN: [id number]   Date of Birth: 1941/07/14  Age: 76 y.o.  Sex: female     Patient is status post Procedure(s):  RIGHT TOTAL KNEE ARTHROPLASTY REVISION.    Surgeon(s) and Role:     * Zollie Beckers, MD - Primary    Local/Dose/Irrigation: 20 ml exparel infiltrared              PICC Double Lumen 75/17/00 Right;Basilic (Active)        Venous Access Device PowerPort 12/20/14 Upper chest (subclavicular area), left (Active)      Peripheral IV 03/18/17 Left Hand (Active)                           Dressing/Packing:  Wound Knee Right-DRESSING TYPE: Aquacel;Cast padding;Elastic bandage (03/18/17 1500)  Splint/Cast:  ]    Other:

## 2017-03-18 NOTE — Anesthesia Post-Procedure Evaluation (Signed)
Post-Anesthesia Evaluation and Assessment    Patient: Jillian Bean MRN: [id number]  SSN: [id number]    Date of Birth: 1941-02-03  Age: 77 y.o.  Sex: female       Cardiovascular Function/Vital Signs  Visit Vitals   ??? BP 139/61   ??? Pulse 61   ??? Temp 36.6 ??C (97.8 ??F)   ??? Resp 12   ??? Ht 5\' 5"  (1.651 m)   ??? Wt 61 kg (134 lb 6 oz)   ??? SpO2 100%   ??? BMI 22.36 kg/m2       Patient is status post spinal anesthesia for Procedure(s):  RIGHT TOTAL KNEE ARTHROPLASTY REVISION.    Nausea/Vomiting: None    Postoperative hydration reviewed and adequate.    Pain:  Pain Scale 1: Numeric (0 - 10) (03/18/17 1700)  Pain Intensity 1: 0 (03/18/17 1700)   Managed    Neurological Status:   Neuro (WDL): Within Defined Limits (03/18/17 1700)   At baseline    Mental Status and Level of Consciousness: Arousable    Pulmonary Status:   O2 Device: Room air (03/18/17 1704)   Adequate oxygenation and airway patent    Complications related to anesthesia: None    Post-anesthesia assessment completed. No concerns    Signed By: Nehemiah Massed, MD     March 18, 2017

## 2017-03-18 NOTE — Anesthesia Procedure Notes (Signed)
Spinal Block    Performed by: Nehemiah Massed  Authorized by: Nehemiah Massed     Pre-procedure:  Indications: primary anesthetic          Assessment:      Spinal Block Procedure Note    Anesthetic procedure including post-op analgesic plan was discussed and all questions were answered. Pt. agrees to proceed.     Patient was assisted to the seated position in the O.R., a "time-out" was performed.  Monitors (ECG, NIBP & Sp02) and oxygen @ 2LPM via nasal cannula were applied.   Mild sedation was administered.  The back was prepped at the lumbar region with topical antiseptic and draped.   49mL 1% lidocaine was injected locally at the L3-4 interspace.  A 25 G pencil point spinal needle was placed @ the above noted interspace and advanced until CSF was obtained. No blood or paresthesia was noted.  Bupivacaine-MPF 12mg    was injected into the CSF.     Patient tolerated the procedure well. No apparent complications were noted.

## 2017-03-18 NOTE — Brief Op Note (Signed)
BRIEF OPERATIVE NOTE    Date of Procedure: 03/18/2017   Preoperative Diagnosis: FAILED RIGHT KNEE REPLACEMENT  Postoperative Diagnosis: FAILED RIGHT KNEE REPLACEMENT    Procedure(s):  RIGHT TOTAL KNEE ARTHROPLASTY REVISION  Both Components  Surgeon(s) and Role:     * Zollie Beckers, MD - Primary         Surgical Assistant: Mul[place]    Surgical Staff:  Circ-1: Bonney Leitz, RN  Circ-Relief: Audie Clear  Physician Assistant: Raynelle Highland, PA  Scrub Tech-1: Ilean China  Scrub Tech-2: Merleen Milliner  Scrub RN-Relief: Katy Apo, RN  Surg Asst-1: Christell Constant  Surg Asst-Relief: Lyda Kalata  Event Time In   Incision Start 5009   Incision Close      Anesthesia: Spinal   Estimated Blood Loss: 100cc  Specimens: * No specimens in log *   Findings: No sign of deep infection   Complications: none  Implants:   Implant Name Type Inv. Item Serial No. Manufacturer Lot No. LRB No. Used Action   CEMENT BNE GENTAMC GHV 40GM -- SMARTSET - SNA  CEMENT BNE GENTAMC GHV 40GM -- SMARTSET NA [employer] [id number] Right 4 Implanted   EXT STEM FEM NXGN [id number] --  - [id number]  EXT STEM FEM NXGN [id number] --  [id number] [employer] [id number] Right 1 Implanted   SPACER FEM POST PC NXGN SZ E --  - [id number]  SPACER FEM POST PC NXGN SZ E --  [id number] [employer] [id number] Right 1 Implanted   SPACER FEM POST PC NXGN SZ E --  - [id number]  SPACER FEM POST PC NXGN SZ E --  [id number] [employer] [id number] Right 1 Implanted   COMPNT KNEE FEM LCCK SZ E RT --  - [id number]  COMPNT KNEE FEM LCCK SZ E RT --  [id number] [employer] [id number] Right 1 Implanted   EXT STEM FEM OFST NXGN 12X100 --  - [id number]  EXT STEM FEM OFST NXGN 12X100 --  [id number] [employer] [id number] Right 1 Implanted   articular surface   [id number]  [id number] Right 1 Implanted   PLATE TIB WDG PC SZ 3 TIV -- NEXGEN - [id number]  PLATE TIB WDG PC SZ 3 TIV -- NEXGEN [id number] [employer] [id number] Right 1 Implanted    CONE TIB AUG MED 31X25MM -- TRABECULAR METAL - [id number]   CONE TIB AUG MED 31X25MM -- TRABECULAR METAL [id number] [employer] [id number] Right 1 Implanted

## 2017-03-18 NOTE — Progress Notes (Signed)
Bedside shift change report given to Devon, RN (oncoming nurse) by Takeila Thayne, RN (offgoing nurse). Report included the following information SBAR, Kardex and Recent Results.

## 2017-03-18 NOTE — Progress Notes (Addendum)
TRANSFER - IN REPORT:    Verbal report received from Earl Park, RN(name) on Jillian Bean  being received from [employer]) for routine post - op      Report consisted of patient???s Situation, Background, Assessment and   Recommendations(SBAR).     Information from the following report(s) SBAR, Kardex and Recent Results was reviewed with the receiving nurse.    Opportunity for questions and clarification was provided.      Assessment completed upon patient???s arrival to unit and care assumed.         Primary Nurse Algis Downs, RN and Geni Bers, RN performed a dual skin assessment on this patient No impairment noted  Braden score is 20

## 2017-03-18 NOTE — Anesthesia Pre-Procedure Evaluation (Addendum)
Anesthetic History     PONV          Review of Systems / Medical History  Patient summary reviewed, nursing notes reviewed and pertinent labs reviewed    Pulmonary  Within defined limits                 Neuro/Psych   Within defined limits           Cardiovascular  Within defined limits                     GI/Hepatic/Renal  Within defined limits              Endo/Other      Hypothyroidism  Arthritis     Other Findings            Physical Exam    Airway  Mallampati: II  TM Distance: > 6 cm  Neck ROM: normal range of motion   Mouth opening: Normal     Cardiovascular  Regular rate and rhythm,  S1 and S2 normal,  no murmur, click, rub, or gallop             Dental  No notable dental hx       Pulmonary  Breath sounds clear to auscultation               Abdominal  GI exam deferred       Other Findings            Anesthetic Plan    ASA: 2  Anesthesia type: spinal      Post-op pain plan if not by surgeon: peripheral nerve block single    Induction: Intravenous  Anesthetic plan and risks discussed with: Patient

## 2017-03-18 NOTE — Progress Notes (Addendum)
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    No lab exists for component: INREXT  SCr Trend:  Recent Labs      02/15/17   1511  11/30/16   0510  11/28/16   0525  11/27/16   0331  11/21/16   1334  07/25/16   1457   CREA  0.71  0.68  0.60  0.72  0.65  0.70   BUN  13  7  7  7  12  21    Est CrCL: >30 ml/min    Drug Interactions: None   Plan: Change to 2.5 mg Q12hrs to start POD1 at 0900 (Incision closure on POD0 @ 1660).  Would recommend resuming full anticoagulation on POD2 (or at the latest POD3) as long as patient is hemodynamically stable.

## 2017-03-18 NOTE — H&P (Signed)
Date of Surgery Update:  Jillian Bean was seen and examined.  History and physical has been reviewed. The patient has been examined. There have been no significant clinical changes since the completion of the originally dated History and Physical.    Signed By: Zollie Beckers, MD     March 18, 2017 11:23 AM

## 2017-03-18 NOTE — Other (Signed)
TRANSFER - OUT REPORT:    Verbal report given to Amber RN(name) on Jillian Bean  being transferred to 556(unit) for routine post - op       Report consisted of patient???s Situation, Background, Assessment and   Recommendations(SBAR).     Time Pre op antibiotic given:1339  Anesthesia Stop time: 9147  Foley Present on Transfer to floor:y  Order for Foley on Chart:y  Discharge Prescriptions with Chart:na    Information from the following report(s) SBAR, Kardex, Intake/Output and MAR was reviewed with the receiving nurse.    Opportunity for questions and clarification was provided.     Is the patient on 02? NO       L/Min na       Other na    Is the patient on a monitor? NO    Is the nurse transporting with the patient? NO    Surgical Waiting Area notified of patient's transfer from PACU? YES      The following personal items collected during your admission accompanied patient upon transfer:   Dental Appliance: Dental Appliances: At home, Partials  Vision:    Hearing Aid:    Jewelry:    Clothing:    Other Valuables:    Valuables sent to safe:

## 2017-03-19 ENCOUNTER — Encounter: Primary: Internal Medicine

## 2017-03-19 LAB — METABOLIC PANEL, BASIC
Anion gap: 12 mmol/L (ref 5–15)
BUN/Creatinine ratio: 20 (ref 12–20)
BUN: 15 MG/DL (ref 6–20)
CO2: 24 mmol/L (ref 21–32)
Calcium: 7.6 MG/DL — ABNORMAL LOW (ref 8.5–10.1)
Chloride: 106 mmol/L (ref 97–108)
Creatinine: 0.75 MG/DL (ref 0.55–1.02)
GFR est AA: >60 mL/min/{1.73_m2} (ref >60)
GFR est non-AA: >60 mL/min/{1.73_m2} (ref >60)
Glucose: 142 mg/dL — ABNORMAL HIGH (ref 65–100)
Potassium: 3.9 mmol/L (ref 3.5–5.1)
Sodium: 142 mmol/L (ref 136–145)

## 2017-03-19 LAB — HEMOGLOBIN: HGB: 7.6 g/dL — ABNORMAL LOW (ref 11.5–16.0)

## 2017-03-19 MED ORDER — SODIUM CHLORIDE 0.9 % IV
INTRAVENOUS | Status: DC | PRN
Start: 2017-03-19 — End: 2017-03-22

## 2017-03-19 MED FILL — SYNTHROID 112 MCG TABLET: 112 mcg | ORAL | Qty: 1

## 2017-03-19 MED FILL — NORMAL SALINE FLUSH 0.9 % INJECTION SYRINGE: INTRAMUSCULAR | Qty: 10

## 2017-03-19 MED FILL — KETOROLAC TROMETHAMINE 30 MG/ML INJECTION: 30 mg/mL (1 mL) | INTRAMUSCULAR | Qty: 1

## 2017-03-19 MED FILL — DOK PLUS 8.6 MG-50 MG TABLET: ORAL | Qty: 1

## 2017-03-19 MED FILL — HYDROMORPHONE 2 MG TAB: 2 mg | ORAL | Qty: 2

## 2017-03-19 MED FILL — ONDANSETRON (PF) 4 MG/2 ML INJECTION: 4 mg/2 mL | INTRAMUSCULAR | Qty: 2

## 2017-03-19 MED FILL — CEFAZOLIN 2 GRAM/20 ML IN STERILE WATER INTRAVENOUS SYRINGE: 2 gram/0 mL | INTRAVENOUS | Qty: 20

## 2017-03-19 MED FILL — WELCHOL 625 MG TABLET: 625 mg | ORAL | Qty: 1

## 2017-03-19 MED FILL — ACETAMINOPHEN 500 MG TAB: 500 mg | ORAL | Qty: 2

## 2017-03-19 MED FILL — PROPOFOL 10 MG/ML IV EMUL: 10 mg/mL | INTRAVENOUS | Qty: 65

## 2017-03-19 MED FILL — HYDROMORPHONE 2 MG TAB: 2 mg | ORAL | Qty: 1

## 2017-03-19 MED FILL — DEXAMETHASONE SODIUM PHOSPHATE 4 MG/ML IJ SOLN: 4 mg/mL | INTRAMUSCULAR | Qty: 1

## 2017-03-19 MED FILL — ELIQUIS 2.5 MG TABLET: 2.5 mg | ORAL | Qty: 1

## 2017-03-19 MED FILL — FAMOTIDINE 20 MG TAB: 20 mg | ORAL | Qty: 1

## 2017-03-19 MED FILL — SODIUM CHLORIDE 0.9 % IV: INTRAVENOUS | Qty: 250

## 2017-03-19 MED FILL — PHENYLEPHRINE 10 MG/ML INJECTION: 10 mg/mL | INTRAMUSCULAR | Qty: 1

## 2017-03-19 MED FILL — SENSORCAINE-MPF 0.5 % (5 MG/ML) INJECTION SOLUTION: 0.5 % (5 mg/mL) | INTRAMUSCULAR | Qty: 12

## 2017-03-19 MED FILL — POLYETHYLENE GLYCOL 3350 17 GRAM (100 %) ORAL POWDER PACKET: 17 gram | ORAL | Qty: 1

## 2017-03-19 NOTE — Progress Notes (Signed)
Bedside shift change report given to Fara Olden, RN (oncoming nurse) by Otila Kluver, RN (offgoing nurse). Report included the following information SBAR.

## 2017-03-19 NOTE — Progress Notes (Signed)
Problem: Falls - Risk of  Goal: *Absence of Falls  Document Schmid Fall Risk and appropriate interventions in the flowsheet.   Outcome: Progressing Towards Goal  Fall Risk Interventions:  Mobility Interventions: PT Consult for mobility concerns, Patient to call before getting OOB         Medication Interventions: Patient to call before getting OOB    Elimination Interventions: Call light in reach, Patient to call for help with toileting needs

## 2017-03-19 NOTE — Progress Notes (Signed)
Physical Therapy  8.7.18    Chart reviewed. Spoke with RN, Kathlee Nations, and patient to receive 2 units of PRBC's this PM due to hypotension at rest and HgB 7.6. Will hold afternoon PT session due to medical instability. F/u tomorrow. Thank you.    Chelsie Elsie Ra, PT, DPT

## 2017-03-19 NOTE — Other (Signed)
Bedside shift change report given Kathlee Nations ,RN (oncoming nurse) by Malva Cogan, RN(offgoing nurse).  Report given with SBAR.

## 2017-03-19 NOTE — Progress Notes (Signed)
Occupational Therapy Note:     Received OT orders this morning.  Pt with low BP this morning and not appropriate to progress OOB at this time.  Will follow up later today v. Tomorrow as able.     Ocie Cornfield, OT

## 2017-03-19 NOTE — Progress Notes (Signed)
03/19/17 0352   Vital Signs   Temp 97.2 ??F (36.2 ??C)   Temp Source Oral   Pulse (Heart Rate) 65   Heart Rate Source Monitor   Resp Rate 16   O2 Sat (%) 98 %   Level of Consciousness Alert   BP (!) 87/51   MAP (Calculated) (!) 63   500 cc bolus initiated d/t low bp. Will reassess. Pt asymptomatic.

## 2017-03-19 NOTE — Progress Notes (Signed)
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      Current Labs:  Recent Labs      03/19/17   0355   CREA  0.75   HGB  7.6*   Drug Interactions: None   Plan: Continue current regimen.  Patient received 2 units PRBCs Today x 2 (POD1).  Will monitor, would recommend resuming home dose Tomorrow or Thursday if hemodynamically stable.

## 2017-03-19 NOTE — Progress Notes (Signed)
Complaints: None   Events: None      GEN:  NAD. AOx3   ABD:  S/NT/ND   RLE:  Dressing C/D/I    5/5 motor    Calf nttp (Bilat)    Sensate all distribution to light touch    1+ dp/pt pulses, foot perfused      Lab Results   Component Value Date/Time    HGB 7.6 (L) 03/19/2017 03:55 AM    INR 1.0 02/15/2017 03:11 PM       Lab Results   Component Value Date/Time    Sodium 142 03/19/2017 03:55 AM    Potassium 3.9 03/19/2017 03:55 AM    Chloride 106 03/19/2017 03:55 AM    CO2 24 03/19/2017 03:55 AM    BUN 15 03/19/2017 03:55 AM    Creatinine 0.75 03/19/2017 03:55 AM    Calcium 7.6 (L) 03/19/2017 03:55 AM            POD #1 RIGHT TOTAL KNEE REPLACEMENT. Satisfactory progress.    ABX: Complete today  PATHWAY: D/C Foley per protocol  DVT Prophylaxis: Eliquis  Weight Bearing: WBAT RLE   Pain Control: PRN oral narcotics, celebrex  Anticipated Discharge Date: Pending    Disposition: Home, HHPT.     2U PRBC today for acute surgical blood loss.

## 2017-03-19 NOTE — Progress Notes (Addendum)
Care Management Interventions  PCP Verified by CM: Yes Charlynne Pander, MD)  Palliative Care Criteria Met (RRAT>21 & CHF Dx)?: No  Mode of Transport at Discharge: Other (see comment) (family)  Transition of Care Consult (CM Consult): Home Health, Discharge Planning  [place]: No  Reason Outside [place]: Patient already serviced by other home care/hospice agency (patient used [facility] before.)  MyChart Signup: No  Discharge Durable Medical Equipment: No  Health Maintenance Reviewed: Yes  Physical Therapy Consult: Yes  Occupational Therapy Consult: Yes  Speech Therapy Consult: No  Current Support Network: Lives with Spouse, Own Home  Confirm Follow Up Transport: Family  Plan discussed with Pt/Family/Caregiver: Yes  Freedom of Choice Offered: Yes  Educational psychologist Information Provided?: No  Discharge Location  Discharge Placement: Home with home health     Reason for Admission:   RIGHT TOTAL KNEE ARTHROPLASTY REVISION                   RRAT Score:   14               Do you (patient/family) have any concerns for transition/discharge?  None                 Plan for utilizing home health:   Offered freedom of choice, patient prefers [facility]. Referral sent.  They have accepted patient.    Likelihood of readmission? no              Transition of Care Plan:  Home with home health.    Regenia Skeeter, BSW/CRM

## 2017-03-19 NOTE — Progress Notes (Signed)
Problem: Mobility Impaired (Adult and Pediatric)  Goal: *Acute Goals and Plan of Care (Insert Text)  Physical Therapy Goals  Initiated 03/19/2017    1. Patient will move from supine to sit and sit to supine , scoot up and down and roll side to side in bed with modified independence within 4 days.  2. Patient will perform sit to stand with modified independence within 4 days.  3. Patient will ambulate with modified independence for 150 feet with the least restrictive device within 4 days.  4. Patient will perform home exercise program per protocol (isometrics only due to ROM restrictions) with independence within 4 days.      physical Therapy EVALUATION  Patient: Jillian Bean (76 y.o. female)  Date: 03/19/2017  Primary Diagnosis: FAILED RIGHT KNEE REPLACEMENT  Failed total right knee replacement, subsequent encounter  Procedure(s) (LRB):  RIGHT TOTAL KNEE ARTHROPLASTY REVISION (Right) 1 Day Post-Op   Precautions:   Fall, WBAT (knee immobilizer at all times) no ROM    ASSESSMENT :  Based on the objective data described below, the patient presents with hypotension limiting mobility this session, following R TKR POD 1. Resting BP upon PT entry 77/39, HR 73 bpm. Retook BP, 79/43, HR 73 bpm. Aborted plans for mobility and patient participated in rolling for bed pan placement and chuck pad exchange. Min A intermittently with rolling for R LE management. RN notified and present to assess patient upon PT exit. Patient likely will be able to progress towards HHPT once medically optimized.      Patient will benefit from skilled intervention to address the above impairments.  Patient???s rehabilitation potential is considered to be Good  Factors which may influence rehabilitation potential include:   []          None noted  []          Mental ability/status  [x]          Medical condition- hypotension  []          Home/family situation and support systems  []          Safety awareness  []          Pain tolerance/management   []          Other:      PLAN :  Recommendations and Planned Interventions:  [x]            Bed Mobility Training             [x]     Neuromuscular Re-Education  [x]            Transfer Training                   []     Orthotic/Prosthetic Training  [x]            Gait Training                         []     Modalities  [x]            Therapeutic Exercises           [x]     Edema Management/Control  [x]            Therapeutic Activities            [x]     Patient and Family Training/Education  []            Other (comment):    Frequency/Duration: Patient will be followed by physical therapy  twice daily  to address goals.  Discharge Recommendations: Home Health  Further Equipment Recommendations for Discharge: owns RW     SUBJECTIVE:   Patient stated ???I didn't know that.??? regarding knee immobilizer and no ROM     OBJECTIVE DATA SUMMARY:   HISTORY:    Past Medical History:   Diagnosis Date   ??? Anemia    ??? Back pain 09/01/2007   ??? Cancer (HCC)     R breast   ??? Chronic pain     BACK PAIN   ??? Coagulation disorder ([place])     IRON DEFFICIENCY ANEMIA   ??? Colonic polyps 08/31/1998   ??? 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 DVT (deep venous thrombosis) ([place]) 01/2017   ??? Hiatal hernia 07/11/2011    Dr.sobieski   ??? Hypercholesteremia 09/01/2007   ??? Hypothyroidism 09/01/2003   ??? 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 Nausea & vomiting    ??? OA (osteoarthritis) 09/01/2007   ??? Other ill-defined conditions(799.89)     VERTIGO   ??? Plantar fasciitis 09/01/2007   ??? S/P colonoscopy 10/10/2007   ??? Thromboembolus ([place]) 01/2017    RIGHT LEG   ??? Thyroid disease      Past Surgical History:   Procedure Laterality Date   ??? COLONOSCOPY N/A 11/12/2016    COLONOSCOPY performed by Burnett Harry, MD at [facility]   ??? COLONOSCOPY,DIAGNOSTIC  11/12/2016        ??? ENDOSCOPY, COLON, DIAGNOSTIC  7/12/18/08    7/05 Dr Maudry Diego   ??? HX BREAST LUMPECTOMY Right 2016    ??? HX BREAST REDUCTION  1993   ??? HX COLONOSCOPY  2014    Dr Illene Silver   ??? HX GI      COLONOSCOPY   ??? HX HEENT  04/07/10    thyroid biopsy neg dr Leward Quan   ??? HX HEENT      wisdom teeth extraction   ??? HX HYSTERECTOMY  1975    PARTIAL   ??? HX KNEE REPLACEMENT Left 08/22/09   ??? HX KNEE REPLACEMENT Right 08/13/12   ??? HX ORTHOPAEDIC Right 11/26/2016    cleaned infection in joint   ??? HX OTHER SURGICAL  2013    MINIMALLY INVASIVE LUMBAR DECOMPRESSION   ??? NEUROLOGICAL PROCEDURE UNLISTED  2013    LUMBAR DECOMPRESSION   ??? UPPER GI ENDOSCOPY,BIOPSY  11/12/2016          Prior Level of Function/Home Situation: Patient mod I using RW for short household distances PTA, supportive husband  Personal factors and/or comorbidities impacting plan of care: PMH    Home Situation  Home Environment: Private residence  # Steps to Enter: 0  One/Two Story Residence: Two story, live on 1st floor  Living Alone: No  Support Systems: Copy  Patient Expects to be Discharged to:: Private residence  Current DME Used/Available at Home: Wheelchair, Environmental consultant, rolling, [place], straight, Civil engineer, contracting, Grab bars    EXAMINATION/PRESENTATION/DECISION MAKING:   Critical Behavior:  Neurologic State: Alert  Orientation Level: Oriented X4  Cognition: Appropriate decision making, Appropriate for age attention/concentration, Appropriate safety awareness  Hearing:  Auditory  Auditory Impairment: None  Range Of Motion:  AROM: Within functional limits (exception R knee)  Strength:    Strength: Within functional limits  Tone & Sensation:   Tone: Normal  Coordination:  Coordination: Within functional limits  Functional Mobility:  Bed Mobility:  Rolling: Contact  guard assistance;Minimum assistance (min A for R LE management)  Functional Measure:  Tinetti test:    Sitting Balance: 0  Arises: 0  Attempts to Rise: 0  Immediate Standing Balance: 0  Standing Balance: 0  Nudged: 0  Eyes Closed: 0  Turn 360 Degrees - Continuous/Discontinuous: 0   Turn 360 Degrees - Steady/Unsteady: 0  Sitting Down: 0  Balance Score: 0  Indication of Gait: 0  R Step Length/Height: 0  L Step Length/Height: 0  R Foot Clearance: 0  L Foot Clearance: 0  Step Symmetry: 0  Step Continuity: 0  Path: 0  Trunk: 0  Walking Time: 0  Gait Score: 0  Total Score: 0       Tinetti Test and G-code impairment scale:  Percentage of Impairment CH    0%   CI    1-19% CJ    20-39% CK    40-59% CL    60-79% CM    80-99% CN     100%   Tinetti  Score 0-28 28 23-27 17-22 12-16 6-11 1-5 0       Tinetti Tool Score Risk of Falls  <19 = High Fall Risk  19-24 = Moderate Fall Risk  25-28 = Low Fall Risk  Tinetti ME. Performance-Oriented Assessment of Mobility Problems in Elderly Patients. [place]; [id number]. (Scoring Description: PT Bulletin Feb. 10, 1993)    Older adults: Letta Moynahan et al, 2009; n = [place] elderly evaluated with ABC, POMA, ADL, and IADL)  ?? Mean POMA score for males aged 82-79 years = 26.21(3.40)  ?? Mean POMA score for females age 53-79 years = 25.16(4.30)  ?? Mean POMA score for males over 80 years = 23.29(6.02)  ?? Mean POMA score for females over 80 years = 17.20(8.32)         G codes:  In compliance with CMS???s Claims Based Outcome Reporting, the following G-code set was chosen for this patient based on their primary functional limitation being treated:    The outcome measure chosen to determine the severity of the functional limitation was the Tinetti with a score of 0/28 which was correlated with the impairment scale.    ? Mobility - Walking and Moving Around:    [phone] - CURRENT STATUS: CN - 100% impaired, limited or restricted   G8979 - GOAL STATUS: CM - 80%-99% impaired, limited or restricted   H9622 - D/C STATUS:  ---------------To be determined---------------      Physical Therapy Evaluation Charge Determination   History Examination Presentation Decision-Making   HIGH Complexity :3+ comorbidities / personal factors will impact the  outcome/ POC  MEDIUM Complexity : 3 Standardized tests and measures addressing body structure, function, activity limitation and / or participation in recreation  MEDIUM Complexity : Evolving with changing characteristics  Other outcome measures Tinetti 0/28  HIGH       Based on the above components, the patient evaluation is determined to be of the following complexity level: MEDIUM    Pain:  Pain Scale 1: Numeric (0 - 10)  Pain Intensity 1: 0  Pain Intervention(s) 1: Medication (see MAR)  Activity Tolerance:   Limited by hypotension at rest  Please refer to the flowsheet for vital signs taken during this treatment.  After treatment:   []          Patient left in no apparent distress sitting up in chair  [x]          Patient left in no apparent distress in bed  [  x]         Call bell left within reach  [x]          Nursing notified  []          Caregiver present  []          Bed alarm activated    COMMUNICATION/EDUCATION:   The patient???s plan of care was discussed with: Occupational Therapist and Registered Nurse.  [x]          Fall prevention education was provided and the patient/caregiver indicated understanding.  [x]          Patient/family have participated as able in goal setting and plan of care.  [x]          Patient/family agree to work toward stated goals and plan of care.  []          Patient understands intent and goals of therapy, but is neutral about his/her participation.  []          Patient is unable to participate in goal setting and plan of care.    Thank you for this referral.  Lebron Conners, PT, DPT   Time Calculation: 25 mins

## 2017-03-20 LAB — TYPE & SCREEN
ABO/Rh(D): O POS
ANTIGEN TYPING: NEGATIVE
ANTIGEN/ANTIBODY INFO: NEGATIVE
ANTIGEN/ANTIBODY INFO: NEGATIVE
Antibody screen: POSITIVE
Status of unit: TRANSFUSED
Status of unit: TRANSFUSED
Unit division: 0
Unit division: 0
Unit division: 0
Unit division: 0

## 2017-03-20 LAB — HEMOGLOBIN: HGB: 8.1 g/dL — ABNORMAL LOW (ref 11.5–16.0)

## 2017-03-20 LAB — TYPE AND SCREEN
ABO/Rh: O POS
Antibody Screen: POSITIVE
Antigen Typing,(RBC): NEGATIVE
Antigen/Antibody: NEGATIVE
Antigen/Antibody: NEGATIVE
Status: TRANSFUSED
Status: TRANSFUSED
Unit Divison: 0
Unit Divison: 0
Unit Divison: 0
Unit Divison: 0

## 2017-03-20 MED ORDER — HYDROMORPHONE 2 MG TAB
2 mg | ORAL_TABLET | ORAL | 0 refills | Status: DC | PRN
Start: 2017-03-20 — End: 2017-03-21

## 2017-03-20 MED FILL — TRAMADOL 50 MG TAB: 50 mg | ORAL | Qty: 1

## 2017-03-20 MED FILL — DOK PLUS 8.6 MG-50 MG TABLET: ORAL | Qty: 1

## 2017-03-20 MED FILL — FAMOTIDINE 20 MG TAB: 20 mg | ORAL | Qty: 1

## 2017-03-20 MED FILL — NORMAL SALINE FLUSH 0.9 % INJECTION SYRINGE: INTRAMUSCULAR | Qty: 10

## 2017-03-20 MED FILL — ACETAMINOPHEN 500 MG TAB: 500 mg | ORAL | Qty: 2

## 2017-03-20 MED FILL — POLYETHYLENE GLYCOL 3350 17 GRAM (100 %) ORAL POWDER PACKET: 17 gram | ORAL | Qty: 1

## 2017-03-20 MED FILL — ELIQUIS 2.5 MG TABLET: 2.5 mg | ORAL | Qty: 1

## 2017-03-20 MED FILL — WELCHOL 625 MG TABLET: 625 mg | ORAL | Qty: 1

## 2017-03-20 MED FILL — HYDROMORPHONE 2 MG TAB: 2 mg | ORAL | Qty: 1

## 2017-03-20 NOTE — Progress Notes (Signed)
Spiritual Care Assessment/Progress Note  [facility]      NAME: Jillian Bean      MRN: [id number]  AGE: 76 y.o. SEX: female  Religious Affiliation: Methodist   Language: English     03/20/2017     Total Time (in minutes): 20     Spiritual Assessment begun in [place] through conversation with:         [x] Patient        [x]  Family    []  Friend(s)        Reason for Consult: Initial/Spiritual assessment, patient floor     Spiritual beliefs: (Please include comment if needed)     [x]  Identifies with a faith tradition:         []  Supported by a faith community:            []  Claims no spiritual orientation:           []  Seeking spiritual identity:                []  Adheres to an individual form of spirituality:           []  Not able to assess:                           Identified resources for coping:      []  Prayer                               []  Music                  []  Guided Imagery     [x]  Family/friends                 []  Pet visits     []  Devotional reading                         []  Unknown     []  Other:                                              Interventions offered during this visit: (See comments for more details)    Patient Interventions: Affirmation of emotions/emotional suffering, Affirmation of faith           Plan of Care:     []  Support spiritual and/or cultural needs    []  Support AMD and/or advance care planning process      []  Support grieving process   []  Coordinate Rites and/or Rituals    []  Coordination with community clergy   [x]  No spiritual needs identified at this time   []  Detailed Plan of Care below (See Comments)  []  Make referral to Music Therapy  []  Make referral to Pet Therapy     []  Make referral to Addiction services  []  Make referral to [place]  []  Make referral to Spiritual Care Partner  []  No future visits requested        [x]  Follow up visits as needed     Visited pt on 5S. Her husband Edd Arbour and her sister-in-law were present.  Chaplain knows Edd Arbour, who is a Psychologist, occupational at [facility]. She is well supported by her family. Chaplains will follow as  needed.  Marda Stalker, Chaplain, MDiv, MS, [facility]  [address] [phone])

## 2017-03-20 NOTE — Progress Notes (Signed)
Bedside and Verbal shift change report given to Frank (oncoming nurse) by Liz (offgoing nurse). Report included the following information SBAR.

## 2017-03-20 NOTE — Progress Notes (Signed)
Problem: Falls - Risk of  Goal: *Absence of Falls  Document Schmid Fall Risk and appropriate interventions in the flowsheet.   Outcome: Progressing Towards Goal  Fall Risk Interventions:  Mobility Interventions: Patient to call before getting OOB         Medication Interventions: Patient to call before getting OOB, Teach patient to arise slowly    Elimination Interventions: Call light in reach, Patient to call for help with toileting needs, Toileting schedule/hourly rounds

## 2017-03-20 NOTE — Progress Notes (Signed)
Complaints: none  Events: none    GEN:  NAD. AOx3   ABD:  S/NT/ND   RLE:  Dressing C/D/I    5/5 motor    Calf nttp (Bilat)    Sensate all distribution to light touch    1+ dp/pt pulses, foot perfused      Lab Results   Component Value Date/Time    HGB 8.1 (L) 03/20/2017 04:48 AM    INR 1.0 02/15/2017 03:11 PM       Lab Results   Component Value Date/Time    Sodium 142 03/19/2017 03:55 AM    Potassium 3.9 03/19/2017 03:55 AM    Chloride 106 03/19/2017 03:55 AM    CO2 24 03/19/2017 03:55 AM    BUN 15 03/19/2017 03:55 AM    Creatinine 0.75 03/19/2017 03:55 AM    Calcium 7.6 (L) 03/19/2017 03:55 AM            POD#2 RIGHT TOTAL KNEE REVISION. Doing Well. Patient has made satisfactory progress and is feeling much better since receiving blood transfusion. She has not yet gotten up with PT. Will attempt this today. Likely discharge Friday.    ABX: Complete today  PATHWAY: D/C Foley per protocol  DVT Prophylaxis: Eliquis  Weight Bearing: WBAT RLE, hold ROM for 10-14 days  Pain Control: PRN oral narcotics, celebrex  Anticipated Discharge Date: Friday  Disposition: Home, HHPT.

## 2017-03-20 NOTE — Op Note (Signed)
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      PROCEDURE PERFORMED:  Revision of both components.    SURGEON:  [name], MD    ASSISTANT:  Georges Mouse, PA    ANESTHESIA:  Spinal.    ESTIMATED BLOOD LOSS:  100 mL    DRAINS:  None.    COMPLICATIONS:  None.    SPECIMENS REMOVED:  None.      PREOPERATIVE ANTIBIOTIC:  Ancef 2 grams.    IMPLANTS:  Zimmer LCCK knee implant size E femur with bilateral +5 posterolateral augments, size 3 tibia with a small trabecular metal sleeve.    COUNTS:  Sponge, instrument, needle counts were correct at the end of the procedure.    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  She was resected, and a knee replacement was placed, temporary knee was used.  Patient was treated with IV antibiotics.  She presents back today for a revision of her failed knee replacement.    DESCRIPTION OF PROCEDURE:  Anesthetic was initiated.  Preoperative dose of antibiotic was given.  The right side was confirmed as the operative side, prepped and draped in the usual sterile fashion.  Limb was exsanguinated, and the tourniquet was inflated.  Used her old incision, and a medial parapatellar approach was made to the patient's knee.  Synovium was normal appearing.  A complete synovectomy was performed.  The knee was exposed with reestablishing of the medial and lateral gutters.  The knee was flexed.  Patella was subluxed.  The tibia was subluxed anteriorly, and the tibial component was removed.  The tibial component was well fixed.  Saws  and osteotomes were used to remove the tibial component.  Due to the tenuous nature of the patient's tibial bone, I exposed the fibular head posterolateral and between the fibular head and the tibia, placed a cerclage wire around the entire proximal tibia.  This was tightened down.  At this point, I began broaching the tibia, and a small trabecular metal sleeve trial was placed, prepared for a size 3 tibia with a 12 mm offset stem extension to get the best bony coverage.  Cleanup cut was made on the tibia to allow good seating of the tibial implant.  At this point, the medullary canal of the femur was entered, reamed the femoral canal and then placed a trial stem and over this trial stem made a distal femur cleanup cut just skimming the bone.  The flexion and extension gaps were then examined, and using a size E femoral cutting block, an anterior cleanup cut was made correcting the patient's femoral rotation.  Box cut was made prepared for the femoral component with +5 posterior augments both laterally and medially to correct the rotation.  The trial femoral component was placed, and the knee was then trialed for polyethylene thickness.  Flexion and extension gaps were equivalent and a 14 poly balanced the knee out well.  The soft tissues were reapproximated to ensure that the knee could be closed, and at this point, the trial components were all removed.  The bony  surfaces were lavaged and dried.  All debris was removed.  The tibial trabecular metal sleeve was impacted into place, and then through the sleeve, I cemented the tray and the stem.  Tibial cement was allowed to fully cure.  At this point mixed for the femur, cemented the femoral component into place.  After the cement fully cured, the real 14 LCCK polyethylene was placed.  The screw was tightened down, copiously irrigated the deep wound and released the tourniquet.  Hemostasis was obtained.  The arthrotomy was closed with heavy Vicryl  sutures for a watertight closure both proximally and distally.  Skin and subcutaneous were irrigated and closed in standard fashion.  Sterile dressing was applied.  Knee immobilizer was placed.  Patient tolerated the procedure well and was taken to the recovery room in stable condition.  No specimen was sent.    Procedure was revision of entire femoral and tibial component.  Both femoral and tibial components were well fixed prior to removal.  Georges Mouse, PA assisted for the procedure.  Zimmer LCCK implant was used.  Refer to brief operative note for all implant sizes.      [name], MD       MAD / HN  D: 03/19/2017 17:49     T: 03/20/2017 07:35  JOB #: [id number]

## 2017-03-20 NOTE — Progress Notes (Signed)
Problem: Mobility Impaired (Adult and Pediatric)  Goal: *Acute Goals and Plan of Care (Insert Text)  Physical Therapy Goals  Initiated 03/19/2017    1. Patient will move from supine to sit and sit to supine , scoot up and down and roll side to side in bed with modified independence within 4 days.  2. Patient will perform sit to stand with modified independence within 4 days.  3. Patient will ambulate with modified independence for 150 feet with the least restrictive device within 4 days.  4. Patient will perform home exercise program per protocol (isometrics only due to ROM restrictions) with independence within 4 days.       physical Therapy TREATMENT  Patient: Jillian Bean (76 y.o. female)  Date: 03/20/2017  Diagnosis: FAILED RIGHT KNEE REPLACEMENT  Failed total right knee replacement, subsequent encounter <principal problem not specified>  Procedure(s) (LRB):  RIGHT TOTAL KNEE ARTHROPLASTY REVISION (Right) 2 Days Post-Op  Precautions: Fall, WBAT (knee immobilizer at all times, ROM )  Chart, physical therapy assessment, plan of care and goals were reviewed.    ASSESSMENT:  Patient received supine in bed, agreeable to therapy. Required overall supervision-SBA for mobility. Balance intact with RW and patient with appropriate sequencing without cues. C/o increased pain with weightbearing and eventual UE fatigue. Minimal weightbearing noted through R LE. Returned to chair and R LE elevated on foot stool. Encouraged to ambulate to bathroom this evening with nursing in order to increase tolerance for activity and increase gait distance towards more household distance length. Husband will assist patient into home using a w/c due to distance needed to ambulate. Patient with good understanding of safety and precautions. Recommend HHPT.  Progression toward goals:  [x]       Improving appropriately and progressing toward goals  []       Improving slowly and progressing toward goals   []       Not making progress toward goals and plan of care will be adjusted     PLAN:  Patient continues to benefit from skilled intervention to address the above impairments.  Continue treatment per established plan of care.  Discharge Recommendations:  Home Health  Further Equipment Recommendations for Discharge:  Owns RW     SUBJECTIVE:   Patient stated ???He's going to use a w/c for that.??? getting into the house    OBJECTIVE DATA SUMMARY:   Critical Behavior:  Neurologic State: Alert, Appropriate for age  Orientation Level: Oriented X4  Cognition: Appropriate decision making, Appropriate for age attention/concentration, Appropriate safety awareness, Follows commands  Safety/Judgement: Awareness of environment, Fall prevention, Good awareness of safety precautions, Home safety, Insight into deficits  Range of Motion:  AROM: Generally decreased, functional (except R knee)  Functional Mobility Training:  Bed Mobility:  Supine to Sit: Supervision  Scooting: Modified independent  Transfers:  Sit to Stand: Stand-by assistance  Stand to Sit: Stand-by assistance  Stand Pivot Transfers: Contact guard assistance  Bed to Chair: Contact guard assistance  Balance:  Sitting: Intact  Standing: Intact;With support  Ambulation/Gait Training:  Distance (ft): 16 Feet (ft)  Assistive Device: Walker, rolling;Gait belt  Ambulation - Level of Assistance: Stand-by assistance  Gait Abnormalities: Antalgic;Decreased step clearance;Step to gait  Right Side Weight Bearing: As tolerated  Left Side Weight Bearing: Full  Base of Support: Shift to left  Stance: Right decreased  Speed/Cadence: Slow  Step Length: Right shortened;Left shortened  Swing Pattern: Right asymmetrical  Pain:  Pain Scale 1: Numeric (0 - 10)  Pain Intensity  1: 3  Pain Location 1: Knee  Pain Orientation 1: Right  Pain Description 1: Aching  Pain Intervention(s) 1: Medication (see MAR)  Activity Tolerance:   NAD, stable sitting in chair   Please refer to the flowsheet for vital signs taken during this treatment.  After treatment:   [x]  Patient left in no apparent distress sitting up in chair  []  Patient left in no apparent distress in bed  [x]  Call bell left within reach  [x]  Nursing notified  []  Caregiver present  []  Bed alarm activated    COMMUNICATION/COLLABORATION:   The patient???s plan of care was discussed with: Registered Nurse    Lebron Conners, PT, DPT   Time Calculation: 13 mins

## 2017-03-20 NOTE — Progress Notes (Signed)
Bedside shift change report given to Kathlee Nations (Soil scientist) by Pilar Plate (offgoing nurse). Report included the following information SBAR, Kardex, Intake/Output, MAR and Recent Results.

## 2017-03-20 NOTE — Progress Notes (Signed)
Occupational Therapy EVALUATION/discharge  Patient: Jillian Bean (76 y.o. female)  Date: 03/20/2017  Primary Diagnosis: FAILED RIGHT KNEE REPLACEMENT  Failed total right knee replacement, subsequent encounter  Procedure(s) (LRB):  RIGHT TOTAL KNEE ARTHROPLASTY REVISION (Right) 2 Days Post-Op   Precautions:  Fall, WBAT (knee immobilizer at all times)    ASSESSMENT:   Based on the objective data described below, the patient presents at an overall CGA level with LE ADLs, toileting and functional mobility using RW and R knee immobilizer.  Pt demonstrated good safety awareness and no LOB during session.  Pt states she has been performing her ADLs and mobility at home for the past several months using knee immobilizer.  She has good support from her husband as needed and all equipment needed for safety. Further skilled acute occupational therapy is not indicated at this time.  Discharge Recommendations: None  Further Equipment Recommendations for Discharge: none in addition to what she owns      SUBJECTIVE:   Patient stated ???I am doing ok.???    OBJECTIVE DATA SUMMARY:   HISTORY:   Past Medical History:   Diagnosis Date   ??? Anemia    ??? Back pain 09/01/2007   ??? Cancer (HCC)     R breast   ??? Chronic pain     BACK PAIN   ??? Coagulation disorder ([place])     IRON DEFFICIENCY ANEMIA   ??? Colonic polyps 08/31/1998   ??? 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 DVT (deep venous thrombosis) ([place]) 01/2017   ??? Hiatal hernia 07/11/2011    Dr.sobieski   ??? Hypercholesteremia 09/01/2007   ??? Hypothyroidism 09/01/2003   ??? 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 Nausea & vomiting    ??? OA (osteoarthritis) 09/01/2007   ??? Other ill-defined conditions(799.89)     VERTIGO   ??? Plantar fasciitis 09/01/2007   ??? S/P colonoscopy 10/10/2007   ??? Thromboembolus ([place]) 01/2017    RIGHT LEG   ??? Thyroid disease      Past Surgical History:   Procedure Laterality Date    ??? COLONOSCOPY N/A 11/12/2016    COLONOSCOPY performed by Burnett Harry, MD at [facility]   ??? COLONOSCOPY,DIAGNOSTIC  11/12/2016        ??? ENDOSCOPY, COLON, DIAGNOSTIC  7/12/18/08    7/05 Dr Maudry Diego   ??? HX BREAST LUMPECTOMY Right 2016   ??? HX BREAST REDUCTION  1993   ??? HX COLONOSCOPY  2014    Dr Illene Silver   ??? HX GI      COLONOSCOPY   ??? HX HEENT  04/07/10    thyroid biopsy neg dr Leward Quan   ??? HX HEENT      wisdom teeth extraction   ??? HX HYSTERECTOMY  1975    PARTIAL   ??? HX KNEE REPLACEMENT Left 08/22/09   ??? HX KNEE REPLACEMENT Right 08/13/12   ??? HX ORTHOPAEDIC Right 11/26/2016    cleaned infection in joint   ??? HX OTHER SURGICAL  2013    MINIMALLY INVASIVE LUMBAR DECOMPRESSION   ??? NEUROLOGICAL PROCEDURE UNLISTED  2013    LUMBAR DECOMPRESSION   ??? UPPER GI ENDOSCOPY,BIOPSY  11/12/2016            Prior Level of Function/Environment/Context: Mod I with ADLs and functional mobility using RW and R knee immobilizer  Home Situation  Home Environment: Private residence  #  Steps to Enter: 0  One/Two Story Residence: Two story, live on 1st floor  Living Alone: No  Support Systems: Friends \\ neighbors, Copy  Patient Expects to be Discharged to:: Private residence  Current DME Used/Available at [employer]: Wheelchair, Environmental consultant, rolling, [place], straight, Civil engineer, contracting, Grab bars  Tub or Shower Type: Community education officer dominance: Right    EXAMINATION OF PERFORMANCE DEFICITS:  Cognitive/Behavioral Status:  Neurologic State: Alert;Appropriate for age  Orientation Level: Oriented X4  Cognition: Appropriate decision making;Appropriate for age attention/concentration;Appropriate safety awareness;Follows commands  Perception: Appears intact  Perseveration: No perseveration noted  Safety/Judgement: Awareness of environment;Fall prevention;Good awareness of safety precautions;Home safety;Insight into deficits    Hearing:  Auditory  Auditory Impairment: None    Vision/Perceptual:    Acuity: Impaired near vision;Within Defined Limits     Corrective Lenses: Reading glasses    Range of Motion:  AROM: Generally decreased, functional (except R knee)                         Strength:  Strength: Generally decreased, functional (except R knee)                Coordination:  Coordination: Within functional limits  Fine Motor Skills-Upper: Left Intact;Right Intact    Gross Motor Skills-Upper: Left Intact;Right Intact    Tone & Sensation:  Tone: Normal                         Balance:  Sitting: Intact;Without support  Standing: Intact;With support    Functional Mobility and Transfers for ADLs:  Bed Mobility:  Supine to Sit: Minimum assistance  Scooting: Supervision    Transfers:  Sit to Stand: Contact guard assistance  Stand to Sit: Contact guard assistance  Bed to Chair: Contact guard assistance  Toilet Transfer : Contact guard assistance;Additional time (using RW and BSC)    ADL Assessment:  Feeding: Independent    Oral Facial Hygiene/Grooming: Contact guard assistance;Additional time (standing at sink)    Bathing: Contact guard assistance;Additional time (seated- CGA for safety with standing)    Upper Body Dressing: Setup    Lower Body Dressing: Contact guard assistance;Additional time (bending forward to reach feet)    Toileting: Contact guard assistance                ADL Intervention and task modifications:  Cognitive Retraining  Safety/Judgement: Awareness of environment;Fall prevention;Good awareness of safety precautions;Home safety;Insight into deficits    Educated patient on energy conservation techniques, strategies to maximize quality of life and decrease stress/anxiety.   1. Deep breathing  2. Educated on pacing and making sure he/she takes short frequent breaks (e.g. In the shower wash the upper body, rest for 1 minute, then wash the lower body, etc)  3. Educated on using cooler water in the shower so as to not get fatigued from the heat  4. Educated on drying off by using a terry cloth robe   5. Educated on re-arranging his/her routine to allow for rest breaks in the morning routine  6. Educated on using a mantra and medication to decrease feelings of anxiety, especially when short of breath  7. Educated on looking at the consequences of his/her actions before deciding he/she needs to take on a task (e.g not getting down on one's hands and knees to wash floors because it will take all of one's energy for the day and result in exhaustion).  8. Educated on DME used to help conserve energy, such as a shower seat, a stool or chair in the kitchen, and pushing or pulling items instead of carrying them.  9. Educated on fall alert necklaces/bracelets to increase safety      Bathing: Patient instructed and indicated understanding when bathing to not submerge wound in water, stand to shower or sponge bathe, cover wound with plastic and tape to ensure no water reaches bandage/wound without cues.    Dressing joint: Patient instructed and demonstrated understanding to don/doff Right LE first/last with Supervision.  Patient instructed and demonstrated to don all clothing while sitting prior to standing, doff all clothing to knees while standing, then sit to doff clothing off from knees to feet in order to facilitate fall prevention, pain management, and energy conservation with Supervision.   Dressing joint reach exercise: To increase independence with lower body dressing, patient instructed and demonstrated to reach down Right LE in a seated position slowly to prevent tearing/shearing until slight pull is felt, hold at end range for 10 seconds, then return to starting upright position with Supervision.  Patient instructed to complete three sets of three repetitions each daily.   Home safety: Patient instructed and indicated understanding on home modifications and safety (raise height of ADL objects, appropriate height of chair surfaces, recliner safety, change of floor surfaces, clear  pathways) to increase independence and fall prevention.  Standing: Patient instructed and demonstrated during ADLs to walk up to sink/counter top/surfaces, step into walker to increase safety of joint and fall prevention with Supervision. Patient educated about knee anatomy verbally and with pictures and educated to avoid rotation of Right LE.  Instructed to apply concept to ADLs within the home (no twisting of knee during reaching across body, square off while using objects, slide objects along surfaces).  Patient instructed and indicated understanding to increase amount of time standing, observe standing position during ADLs in order to increase even weight bearing through bilateral LEs in order to increase independence with ADLs.  Goal to be reached 30 days post - op, per orthopedic surgeon or per PT.    Tub/shower transfer: Patient instructed and indicated understanding regarding when it is safe to begin transfer into tub/shower (complete stairs with PT, advance exercises with PT high enough to clear tub/shower height).  Patient instructed to use the same technique as used with stairs when entering and exiting tub/shower ("up with the non-surgical, down with the surgical leg").     Functional Measure:  Barthel Index:    Bathing: 0  Bladder: 10  Bowels: 10  Grooming: 5  Dressing: 5  Feeding: 10  Mobility: 0  Stairs: 0  Toilet Use: 5  Transfer (Bed to Chair and Back): 10  Total: 55       Barthel and G-code impairment scale:  Percentage of impairment CH  0% CI  1-19% CJ  20-39% CK  40-59% CL  60-79% CM  80-99% CN  100%   Barthel Score 0-100 100 99-80 79-60 59-40 20-39 1-19   0   Barthel Score 0-20 20 17-19 13-16 9-12 5-8 1-4 0      The Barthel ADL Index: Guidelines  1. The index should be used as a record of what a patient does, not as a record of what a patient could do.  2. The main aim is to establish degree of independence from any help, physical or verbal, however minor and for whatever reason.   3. The need for supervision renders  the patient not independent.  4. A patient's performance should be established using the best available evidence. Asking the patient, friends/relatives and nurses are the usual sources, but direct observation and common sense are also important. However direct testing is not needed.  5. Usually the patient's performance over the preceding 24-48 hours is important, but occasionally longer periods will be relevant.  6. Middle categories imply that the patient supplies over 50 per cent of the effort.  7. Use of aids to be independent is allowed.    Daneen Schick., Barthel, D.W. [phone]). Functional evaluation: the Barthel Index. [place] (14)2.  Lucianne Lei der [place], J.J.M.F, [place], Diona Browner., Oris Drone., [place], [place] (1999). Measuring the change indisability after inpatient rehabilitation; comparison of the responsiveness of the Barthel Index and Functional Independence Measure. Journal of Neurology, Neurosurgery, and Psychiatry, 66(4), [phone].  Wilford Sports, N.J.A, Scholte [place],  W.J.M, & Koopmanschap, M.A. (2004.) Assessment of post-stroke quality of life in cost-effectiveness studies: The usefulness of the Barthel Index and the EuroQoL-5D. Quality of Life Research, 13, [phone]       G codes:  In compliance with CMS???s Claims Based Outcome Reporting, the following G-code set was chosen for this patient based on their primary functional limitation being treated:    The outcome measure chosen to determine the severity of the functional limitation was the Barthel Index with a score of 55/100 which was correlated with the impairment scale.    ? Self Care:    [phone] - CURRENT STATUS: CK - 40%-59% impaired, limited or restricted   [id number] - GOAL STATUS: CK - 40%-59% impaired, limited or restricted   [id number] - D/C STATUS:  CK - 40%-59% impaired, limited or restricted     Occupational Therapy Evaluation Charge Determination   History Examination Decision-Making    LOW Complexity : Brief history review  MEDIUM Complexity : 3-5 performance deficits relating to physical, cognitive , or psychosocial skils that result in activity limitations and / or participation restrictions LOW Complexity : No comorbidities that affect functional and no verbal or physical assistance needed to complete eval tasks       Based on the above components, the patient evaluation is determined to be of the following complexity level: LOW   Pain:  Pain Scale 1: Numeric (0 - 10)  Pain Intensity 1: 3  Pain Location 1: Knee  Pain Orientation 1: Right  Pain Description 1: Aching  Pain Intervention(s) 1: Medication (see MAR)    Activity Tolerance:   fair  Please refer to the flowsheet for vital signs taken during this treatment.  After treatment:   []   Patient left in no apparent distress sitting up in chair  [x]   Patient left in no apparent distress in bed  [x]   Call bell left within reach  [x]   Nursing notified  []   Caregiver present  []   Bed alarm activated    COMMUNICATION/EDUCATION:   Communication/Collaboration:  [x]       Home safety education was provided and the patient/caregiver indicated understanding.  [x]       Patient/family have participated as able and agree with findings and recommendations.  []       Patient is unable to participate in plan of care at this time.  Findings and recommendations were discussed with: Physical Therapist and Registered Nurse    Delorise Jackson, OT  Time Calculation: 20 mins

## 2017-03-20 NOTE — Progress Notes (Signed)
Problem: Mobility Impaired (Adult and Pediatric)  Goal: *Acute Goals and Plan of Care (Insert Text)  Physical Therapy Goals  Initiated 03/19/2017    1. Patient will move from supine to sit and sit to supine , scoot up and down and roll side to side in bed with modified independence within 4 days.  2. Patient will perform sit to stand with modified independence within 4 days.  3. Patient will ambulate with modified independence for 150 feet with the least restrictive device within 4 days.  4. Patient will perform home exercise program per protocol (isometrics only due to ROM restrictions) with independence within 4 days.       physical Therapy TREATMENT  Patient: Jillian Bean (76 y.o. 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    ASSESSMENT:  Patient received supine in bed, agreeable to therapy and motivated to transfer to chair. VSS with all position changes and after activity. Patient overall min A-CGA for mobility. Urinary urgency upon standing and was able to complete stand pivot to [facility] with CGA. Noted patient to bear very little weight on her R LE and almost "hop" in TTWB manner with stand pivot. Returned to chair following toileting, VSS. Patient c/o feeling "exhausted" after this. R LE elevated on foot stool. Will need HHPT at d/c. Does not have stairs and will need to progress to mobilizing household distances prior to d/c home.   Progression toward goals:  [x]       Improving appropriately and progressing toward goals  []       Improving slowly and progressing toward goals  []       Not making progress toward goals and plan of care will be adjusted     PLAN:   Patient continues to benefit from skilled intervention to address the above impairments.  Continue treatment per established plan of care.  Discharge Recommendations:  Home Health  Further Equipment Recommendations for Discharge:  Owns RW     SUBJECTIVE:   Patient stated ???I'd rather sit down.??? unable to participate in further gait after transfer due to fatigue    OBJECTIVE DATA SUMMARY:   Critical Behavior:  Neurologic State: Alert  Orientation Level: Oriented X4  Cognition: Appropriate decision making, Appropriate for age attention/concentration, Appropriate safety awareness, Follows commands  Functional Mobility Training:  Bed Mobility:  Supine to Sit: Minimum assistance  Scooting: Supervision  Transfers:  Sit to Stand: Contact guard assistance  Stand to Sit: Contact guard assistance  Stand Pivot Transfers: Contact guard assistance  Bed to Chair: Contact guard assistance  Balance:  Sitting: Intact;Without support  Standing: Intact;With support  Ambulation/Gait Training:  Distance (ft): 3 Feet (ft)  Assistive Device: Walker, rolling;Gait belt  Ambulation - Level of Assistance: Contact guard assistance  Gait Abnormalities: Antalgic;Decreased step clearance;Step to gait  Right Side Weight Bearing: As tolerated  Left Side Weight Bearing: Full  Base of Support: Shift to left  Stance: Right decreased  Speed/Cadence: Slow    Pain:  Pain Scale 1: Numeric (0 - 10)  Pain Intensity 1: 1  Pain Location 1: Knee  Pain Orientation 1: Right  Pain Description 1: Aching  Pain Intervention(s) 1: Distraction  Activity Tolerance:   VSS, fatigued  quickly  Please refer to the flowsheet for vital signs taken during this treatment.  After treatment:   [x]  Patient left in no apparent distress sitting up in chair  []  Patient left in no apparent distress in bed  [x]  Call bell left within reach  [x]  Nursing notified  []  Caregiver present  []  Bed alarm activated    COMMUNICATION/COLLABORATION:    The patient???s plan of care was discussed with: Occupational Therapist and Registered Nurse    Lebron Conners, PT, DPT   Time Calculation: 20 mins

## 2017-03-20 NOTE — Progress Notes (Signed)
Spiritual Care Partner Volunteer visited patient in 556 on 8.8.18.  Documented by:    Shona Simpson, M.Div, M.S, [place] available at [phone])

## 2017-03-21 MED ORDER — TRAMADOL 50 MG TAB
50 mg | ORAL_TABLET | Freq: Four times a day (QID) | ORAL | 0 refills | Status: DC | PRN
Start: 2017-03-21 — End: 2017-04-08

## 2017-03-21 MED ORDER — HYDROMORPHONE 2 MG TAB
2 mg | ORAL | Status: DC | PRN
Start: 2017-03-21 — End: 2017-03-22

## 2017-03-21 MED FILL — SYNTHROID 112 MCG TABLET: 112 mcg | ORAL | Qty: 1

## 2017-03-21 MED FILL — ACETAMINOPHEN 500 MG TAB: 500 mg | ORAL | Qty: 2

## 2017-03-21 MED FILL — WELCHOL 625 MG TABLET: 625 mg | ORAL | Qty: 1

## 2017-03-21 MED FILL — TRAMADOL 50 MG TAB: 50 mg | ORAL | Qty: 1

## 2017-03-21 MED FILL — FAMOTIDINE 20 MG TAB: 20 mg | ORAL | Qty: 1

## 2017-03-21 MED FILL — NORMAL SALINE FLUSH 0.9 % INJECTION SYRINGE: INTRAMUSCULAR | Qty: 10

## 2017-03-21 MED FILL — DOK PLUS 8.6 MG-50 MG TABLET: ORAL | Qty: 1

## 2017-03-21 MED FILL — ELIQUIS 2.5 MG TABLET: 2.5 mg | ORAL | Qty: 1

## 2017-03-21 MED FILL — POLYETHYLENE GLYCOL 3350 17 GRAM (100 %) ORAL POWDER PACKET: 17 gram | ORAL | Qty: 1

## 2017-03-21 NOTE — Progress Notes (Addendum)
Physical Therapy  8.9.18    1. Patient will move from supine to sit and sit to supine , scoot up and down and roll side to side in bed with modified independence within 4 days.  2. Patient will perform sit to stand with modified independence within 4 days.  3. Patient will ambulate with modified independence for 150 feet with the least restrictive device within 4 days.  4. Patient will perform home exercise program per protocol (isometrics only due to ROM restrictions) with independence within 4 days.    S: :"My brace needs adjusting."    O: Patient ambulated 120 ft with SBA provided by rehab tech, Darnelle Maffucci, VSS and no c/o throughout gait. Returned to supine and brace adjusted by PT. Transferred to chair to sit up for evening/dinner.     A: Patient is ready for d/c home and has met all therapy goals at this time. Recommend HHPT.     P: D/c home tomorrow once cleared by surgeon.     Thank you.    Lebron Conners, PT, DPT  Time calculation: 8 minutes

## 2017-03-21 NOTE — Progress Notes (Signed)
Bedside shift change report given to Kristi (oncoming nurse) by Frank (offgoing nurse). Report included the following information SBAR, Kardex, Intake/Output, MAR and Recent Results.

## 2017-03-21 NOTE — Progress Notes (Signed)
Problem: Mobility Impaired (Adult and Pediatric)  Goal: *Acute Goals and Plan of Care (Insert Text)  Physical Therapy Goals  Initiated 03/19/2017    1. Patient will move from supine to sit and sit to supine , scoot up and down and roll side to side in bed with modified independence within 4 days.  2. Patient will perform sit to stand with modified independence within 4 days.  3. Patient will ambulate with modified independence for 150 feet with the least restrictive device within 4 days.  4. Patient will perform home exercise program per protocol (isometrics only due to ROM restrictions) with independence within 4 days.       physical Therapy TREATMENT  Patient: Jillian Bean (76 y.o. female)  Date: 03/21/2017  Diagnosis: FAILED RIGHT KNEE REPLACEMENT  Failed total right knee replacement, subsequent encounter <principal problem not specified>  Procedure(s) (LRB):  RIGHT TOTAL KNEE ARTHROPLASTY REVISION (Right) 3 Days Post-Op  Precautions: Fall, WBAT (knee immobilizer at all times, no ROM)  Chart, physical therapy assessment, plan of care and goals were reviewed.    ASSESSMENT:  Patient received sitting in chair, agreeable to therapy. Made great progress with distance ambulated this date. Increased ambulation distance and tolerance for bearing weight on R LE. Patient safe with RW without cues and strong UEs to compensate for WB. Patient returned to chair. Will be ready for d/c tomorrow AM. Recommend HHPT.   Progression toward goals:  [x]       Improving appropriately and progressing toward goals  []       Improving slowly and progressing toward goals  []       Not making progress toward goals and plan of care will be adjusted     PLAN:  Patient continues to benefit from skilled intervention to address the above impairments.  Continue treatment per established plan of care.  Discharge Recommendations:  Home Health  Further Equipment Recommendations for Discharge:  Owns RW     SUBJECTIVE:    Patient stated ???Using my arms made my neck and shoulders hurt.???    OBJECTIVE DATA SUMMARY:   Critical Behavior:  Neurologic State: Alert  Orientation Level: Oriented X4  Cognition: Appropriate decision making  Safety/Judgement: Awareness of environment, Fall prevention, Good awareness of safety precautions, Home safety, Insight into deficits  Functional Mobility Training:  Transfers:  Sit to Stand: Supervision  Stand to Sit: Modified independent  Balance:  Sitting: Intact  Standing: Intact;With support  Ambulation/Gait Training:  Distance (ft): 90 Feet (ft)  Assistive Device: Walker, rolling;Gait belt  Ambulation - Level of Assistance: Stand-by assistance  Gait Abnormalities: Antalgic;Decreased step clearance;Step to gait  Right Side Weight Bearing: As tolerated  Left Side Weight Bearing: Full  Base of Support: Shift to left  Stance: Right decreased  Speed/Cadence: Pace decreased (<100 feet/min)  Step Length: Right shortened;Left shortened  Swing Pattern: Right asymmetrical  Pain:  Pain Scale 1: Numeric (0 - 10)  Pain Intensity 1: 5  Pain Location 1: Knee  Pain Orientation 1: Right  Pain Description 1: Aching  Pain Intervention(s) 1: Medication (see MAR)  Activity Tolerance:   Good  Please refer to the flowsheet for vital signs taken during this treatment.  After treatment:   [x]  Patient left in no apparent distress sitting up in chair  []  Patient left in no apparent distress in bed  [x]  Call bell left within reach  [x]  Nursing notified  [x]  Caregiver present  []  Bed alarm activated    COMMUNICATION/COLLABORATION:   The  patient???s plan of care was discussed with: Registered Nurse    Lebron Conners, PT, DPT   Time Calculation: 12 mins

## 2017-03-21 NOTE — Progress Notes (Signed)
Complaints: none  Events: none      GEN:  NAD. AOx3   ABD:  S/NT/ND   RLE:  Dressing C/D/I    5/5 motor    Calf nttp (Bilat)    Sensate all distribution to light touch    1+ dp/pt pulses, foot perfused      Lab Results   Component Value Date/Time    HGB 8.1 (L) 03/20/2017 04:48 AM    INR 1.0 02/15/2017 03:11 PM       Lab Results   Component Value Date/Time    Sodium 142 03/19/2017 03:55 AM    Potassium 3.9 03/19/2017 03:55 AM    Chloride 106 03/19/2017 03:55 AM    CO2 24 03/19/2017 03:55 AM    BUN 15 03/19/2017 03:55 AM    Creatinine 0.75 03/19/2017 03:55 AM    Calcium 7.6 (L) 03/19/2017 03:55 AM            POD #3 RIGHT TOTAL KNEE REVISION. Satisfactory progress. Patient is doing well; her pain is well-controlled. Plan to see how today goes and discharge tomorrow if doing well. Follow up in office 3 weeks from surgery.    ABX: Complete   PATHWAY: D/C Foley per protocol  DVT Prophylaxis: Eliquis  Weight Bearing: WBAT RLE, hold ROM  Pain Control: PRN tramadol  Anticipated Discharge Date: Tomorrow  Disposition: Home, HHPT.

## 2017-03-21 NOTE — Progress Notes (Signed)
Bedside shift change report given to Frank (oncoming nurse) by Kristi (offgoing nurse). Report included the following information SBAR.

## 2017-03-22 MED FILL — TRAMADOL 50 MG TAB: 50 mg | ORAL | Qty: 1

## 2017-03-22 MED FILL — FAMOTIDINE 20 MG TAB: 20 mg | ORAL | Qty: 1

## 2017-03-22 MED FILL — DOK PLUS 8.6 MG-50 MG TABLET: ORAL | Qty: 1

## 2017-03-22 MED FILL — ELIQUIS 2.5 MG TABLET: 2.5 mg | ORAL | Qty: 1

## 2017-03-22 MED FILL — POLYETHYLENE GLYCOL 3350 17 GRAM (100 %) ORAL POWDER PACKET: 17 gram | ORAL | Qty: 1

## 2017-03-22 MED FILL — WELCHOL 625 MG TABLET: 625 mg | ORAL | Qty: 1

## 2017-03-22 MED FILL — ACETAMINOPHEN 500 MG TAB: 500 mg | ORAL | Qty: 2

## 2017-03-22 MED FILL — SYNTHROID 112 MCG TABLET: 112 mcg | ORAL | Qty: 1

## 2017-03-22 MED FILL — NORMAL SALINE FLUSH 0.9 % INJECTION SYRINGE: INTRAMUSCULAR | Qty: 10

## 2017-03-22 NOTE — Discharge Summary (Signed)
@  1U[facility]@ [facility]   [place] [address]    DISCHARGE SUMMARY     Patient: Jillian Bean                             Medical Record Number: [id number]                DOB: [birth year]  Age: 76 y.o.  Admit Date: 03/18/2017  Discharge Date: 03/22/2017  Admission Diagnosis: FAILED RIGHT KNEE REPLACEMENT  Failed total right knee replacement, subsequent encounter  Discharge Diagnosis: FAILED RIGHT KNEE REPLACEMENT  Procedures: Procedure(s):  RIGHT TOTAL KNEE ARTHROPLASTY REVISION  Surgeon: Elvin So  Assistants: Mulhern  Anesthesia: spinal  Complications: None     History of Present Illness:  Jillian Bean is a 76 y.o. female with a history of Right knee pain, swelling, and marked loss of function.  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    Hospital Course:  Jillian Bean tolerated the procedure well.  She was transferred  to the recovery room in stable condition.  After a brief stay the patient was then transferred to the Joint Replacement Unit at [facility].  On postoperative day #1, the dressing was clean and dry, she was neurovascularly intact. The patient was afebrile and vital signs were stable.  Calves were soft and non-tender bilaterally. On postoperative day  # 2, the patient was tolerating a regular diet and making satisfactory progress with physical therapy.  Hemoglobin and INR prior to discharge were   Lab Results   Component Value Date/Time    HGB 8.1 (L) 03/20/2017 04:48 AM    INR 1.0 02/15/2017 03:11 PM   .  Jillian Bean made satisfactory progress with physical therapy and was discharged to Home in stable  condition on postoperative day 4.  She was provided with routine postoperative instructions and advised to follow up in my office in 3 weeks following discharge from the hospital.  She was continued on her pre op meds for DVT prophylaxis and oxycodone for post-operative pain.      Discharge Medications:  Cannot display discharge medications since this patient is not currently admitted.      Signed by: Zollie Beckers, MD  03/27/2017

## 2017-03-22 NOTE — Progress Notes (Signed)
Bedside shift change report given to Kristi (oncoming nurse) by Frank (offgoing nurse). Report included the following information SBAR, Kardex, Intake/Output, MAR and Recent Results.

## 2017-03-22 NOTE — Progress Notes (Signed)
Complaints: none   Events: none      GEN:  NAD. AOx3   ABD:  S/NT/ND   RLE:  Dressing C/D/I    5/5 motor    Calf nttp (Bilat)    Sensate all distribution to light touch    1+ dp/pt pulses, foot perfused      Lab Results   Component Value Date/Time    HGB 8.1 (L) 03/20/2017 04:48 AM    INR 1.0 02/15/2017 03:11 PM       Lab Results   Component Value Date/Time    Sodium 142 03/19/2017 03:55 AM    Potassium 3.9 03/19/2017 03:55 AM    Chloride 106 03/19/2017 03:55 AM    CO2 24 03/19/2017 03:55 AM    BUN 15 03/19/2017 03:55 AM    Creatinine 0.75 03/19/2017 03:55 AM    Calcium 7.6 (L) 03/19/2017 03:55 AM            POD #3 RIGHT TOTAL KNEE REPLACEMENT. Satisfactory progress.    ABX: Complete   PATHWAY: D/C Foley per protocol  DVT Prophylaxis: eliquis  Weight Bearing: WBAT RLE   Pain Control: PRN oral narcotics, celebrex  Anticipated Discharge Date: today   Disposition: Home, HHPT.

## 2017-03-22 NOTE — Progress Notes (Signed)
The Joint Replacement Discharge Education video was reviewed by the patient/family. The content was discussed utilizing teach back, questions were answered. The patient verbalized an understanding of the instructions.   I have reviewed discharge instructions with the patient.  The patient verbalized understanding.

## 2017-03-22 NOTE — Discharge Summary (Signed)
@  0[facility]@ [facility]   [place] [address]    DISCHARGE SUMMARY     Patient: Jillian Bean                             Medical Record Number: [id number]                DOB: [birth year]  Age: 76 y.o.  Admit Date: 03/18/2017  Discharge Date: 03/22/2017  Admission Diagnosis: FAILED RIGHT KNEE REPLACEMENT  Failed total right knee replacement, subsequent encounter  Discharge Diagnosis: FAILED RIGHT KNEE REPLACEMENT  Procedures: Procedure(s):  RIGHT TOTAL KNEE ARTHROPLASTY REVISION  Surgeon: Elvin So  Assistants: Mulhern  Anesthesia: spinal  Complications: None     History of Present Illness:  Jillian Bean is a 76 y.o. female with a history of Right knee pain, swelling, and marked loss of function.  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    Hospital Course:  Jillian Bean tolerated the procedure well.  She was transferred  to the recovery room in stable condition.  After a brief stay the patient was then transferred to the Joint Replacement Unit at [facility].  On postoperative day #1, the dressing was clean and dry, she was neurovascularly intact. The patient was afebrile and vital signs were stable.  Calves were soft and non-tender bilaterally. On postoperative day  # 2, the patient was tolerating a regular diet and making satisfactory progress with physical therapy.  Hemoglobin and INR prior to discharge were   Lab Results   Component Value Date/Time    HGB 8.1 (L) 03/20/2017 04:48 AM    INR 1.0 02/15/2017 03:11 PM   .  Jillian Bean made satisfactory progress with physical therapy and was  discharged to Home in stable condition on postoperative day 4.  She was provided with routine postoperative instructions and advised to follow up in my office in 3 weeks following discharge from the hospital.  She was continued on her pre op meds for DVT prophylaxis and oxycodone for post-operative pain.      Discharge Medications:  Cannot display discharge medications since this patient is not currently admitted.      Signed by: Zollie Beckers, MD  03/27/2017

## 2017-03-25 NOTE — Progress Notes (Addendum)
Hospital Discharge Follow-Up      Date/Time:  03/25/2017 4:19 PM    Patient was admitted to [facility] on 03/18/17 and discharged on 03/22/17 for scheduled procedure, Failed total knee replacement and previous right knee infection.   . The physician discharge summary was available at the time of outreach.  Patient was contacted within 2 business days of discharge.      Top Challenges reviewed with the provider   HIGH RISK  Need labs rechecked - HGB - 8.1       Method of communication with provider : staff message  Inpatient RRAT score: 17  Was this a readmission? no   Patient stated reason for the readmission: n/a    Nurse Navigator (NN) contacted the patient by telephone to perform post hospital discharge assessment. Verified name and DOB with patient as identifiers. Provided introduction to self, and explanation of the Nurse Navigator role.     Reviewed discharge instructions and red flags with patient who verbalized understanding. Patient given an opportunity to ask questions and does not have any further questions or concerns at this time. The patient agrees to contact the PCP office for questions related to their healthcare. NN provided contact information for future reference.    Disease Specific:   N/A    Summary of patient's top problems:  1. Failed total knee replacement and previous right knee infection. Revision of both components. Pt reports she is doing well other than surgical pain, 3/10 on pain scale with Tylenol 1000 mg not to exceed 3,000 mg. Denies s/s of infection with surgical site, staples intact. Patient denies C/P, SOB, cough, wheezing, fever, pain/swelling of legs or feet, N/V, diarrhea, difficulty urinating or constipation. BM today, Appetite/hydration good. Pt reports she is wearing TED hose 24/7 for 2 weeks & then will only wear during the day per surgeons instructions.         Home Health orders at discharge: [place]  Date of initial visit: 03/23/17     Durable Medical Equipment ordered/company: yes  Durable Medical Equipment received: walker    Barriers to care? None at this time    Advance Care Planning:   Does patient have an Advance Directive:  reviewed and current     Medication(s):   New Medications at Discharge: traMADol 50 mg tablet  Changed Medications at Discharge: none  Discontinued Medications at Discharge: enoxaparin 60 mg/0.6 mL injection    Medication reconciliation was performed with patient, who verbalizes understanding of administration of home medications.  There were no barriers to obtaining medications identified at this time.    Referral to Pharm D needed: no     Current Outpatient Prescriptions   Medication Sig   ??? traMADol (ULTRAM) 50 mg tablet Take 1 Tab by mouth every six (6) hours as needed. Max Daily Amount: 200 mg.   ??? levothyroxine (SYNTHROID) 112 mcg tablet Take 1 tab by mouth on days Mon-Sat & 2 tabs on Sunday only   ??? colesevelam (WELCHOL) 625 mg tablet Take 1 Tab by mouth two (2) times daily (with meals).   ??? acetaminophen (TYLENOL EXTRA STRENGTH) 500 mg tablet Take 500 mg by mouth every six (6) hours as needed for Pain.   ??? polyethylene glycol (MIRALAX) 17 gram packet Take 17 g by mouth as needed.   ??? apixaban (ELIQUIS) 5 mg tablet Take 1 Tab by mouth two (2) times a day.   ??? brimonidine-timolol (COMBIGAN) 0.2-0.5 %  drop ophthalmic solution Administer 1 Drop to both eyes every twelve (12) hours.   ??? raNITIdine (ZANTAC) 150 mg tablet Take 1 Tab by mouth two (2) times a day.   ??? docusate sodium (COLACE) 100 mg capsule Take 100 mg by mouth daily.   ??? multivitamin (ONE A DAY) tablet Take 1 Tab by mouth daily.     No current facility-administered medications for this visit.        There are no discontinued medications.    [facility] follow up appointment(s): Future Appointments  Date Time Provider [place]   04/17/2017 11:00 AM Bud Face, NP [facility] SCHED   05/08/2017 11:00 AM Doy Hutching [facility] Aldona Lento       PCP Dr. Wanda Plump 04/08/17 or sooner if needed    Non-[facility] follow up appointment(s): ortho surgeon 04/10/17  [facility]:  n/a       Goals     ??? Attends follow-up appointments as directed.            Scheduled to see surgeon 04/10/17 & PCP 04/08/17      ??? Supportive resources in place to maintain patient in the community (ie. Home Health, DME equipment, refer to, medication assistant plan, etc.)            [facility]      ??? Understands red flags post discharge.            Patient will monitor for s/s of infection with surgical site & temp daily

## 2017-03-29 NOTE — Telephone Encounter (Signed)
-----   Message from Sharyn Lull sent at 03/29/2017  9:32 AM EDT -----  Regarding: Dr. Worthy Flank  Pt requesting a call in regards to medication. Pt's contact [phone].

## 2017-03-29 NOTE — Telephone Encounter (Signed)
Pt calling to advise she could not remember if she took her Eliquis this morning. She was getting ready to and was distracted by phone call. Advised to pick up regular dose at next allotted time. Pt understood.

## 2017-04-08 ENCOUNTER — Encounter

## 2017-04-08 ENCOUNTER — Ambulatory Visit: Admit: 2017-04-08 | Payer: MEDICARE | Attending: Internal Medicine | Primary: Internal Medicine

## 2017-04-08 DIAGNOSIS — E785 Hyperlipidemia, unspecified: Secondary | ICD-10-CM

## 2017-04-08 NOTE — Progress Notes (Signed)
Call placed to pt, no answer. VM left to return call.    Mychart letter sent to pt.

## 2017-04-08 NOTE — Progress Notes (Signed)
HISTORY OF PRESENT ILLNESS  Jillian Bean is a 76 y.o. female.  HPI  First visit with me following hospital stay August 6-10 for failed right knee arthroplasty revision surgery done by Dr. Elvin So.  She was discharged again in an [place] for her knee, which she has just gotten out of.  She was transfused 2 units packed cells and prior to discharge HGB was up to 8.1.  She is doing PT currently at home and will then go to [place].  She has a long way to go on improving ROM, but she is beginning to do stairs and walking with a walker and cane.  Her pain is minimal and she's not had fevers or chills since getting home.  She does have some bruising in her calf.  She's not having constipation or UTI symptoms.      Review of Systems   Constitutional: Positive for malaise/fatigue. Negative for chills, fever and weight loss.   Respiratory: Negative for cough, shortness of breath and wheezing.    Cardiovascular: Negative for chest pain, palpitations, orthopnea, leg swelling and PND.   Gastrointestinal: Negative for blood in stool, constipation, heartburn and nausea.   Musculoskeletal: Positive for joint pain. Negative for myalgias.   Skin: Negative for rash.   Neurological: Negative for dizziness and headaches.       Physical Exam   Constitutional: She is oriented to person, place, and time. She appears well-developed and well-nourished.   HENT:   Head: Normocephalic and atraumatic.   Neck: Normal range of motion. Neck supple. Carotid bruit is not present. No thyromegaly present.   Cardiovascular: Normal rate, regular rhythm, S1 normal, S2 normal, normal heart sounds and intact distal pulses.    No murmur heard.  Pulmonary/Chest: Effort normal and breath sounds normal. No respiratory distress. She has no wheezes. She has no rales.   Musculoskeletal: She exhibits no edema.   Right knee scar appears clean no redness or discharge   Neurological: She is alert and oriented to person, place, and time.    Psychiatric: She has a normal mood and affect. Her behavior is normal.   Nursing note and vitals reviewed.      ASSESSMENT and PLAN  Diagnoses and all orders for this visit:    1. Dyslipidemia  -     METABOLIC PANEL, COMPREHENSIVE  -     LIPID PANEL    2. Failed total right knee replacement, subsequent encounter-sp revision on 8/6 doing well    3. Acquired hypothyroidism  -     TSH 3RD GENERATION

## 2017-04-12 ENCOUNTER — Inpatient Hospital Stay: Admit: 2017-06-28 | Payer: MEDICARE | Primary: Internal Medicine

## 2017-04-12 DIAGNOSIS — E785 Hyperlipidemia, unspecified: Secondary | ICD-10-CM

## 2017-04-12 DIAGNOSIS — Z853 Personal history of malignant neoplasm of breast: Secondary | ICD-10-CM

## 2017-04-13 LAB — METABOLIC PANEL, COMPREHENSIVE
A-G Ratio: 1.8 (ref 1.2–2.2)
ALT (SGPT): 10 IU/L (ref 0–32)
AST (SGOT): 13 IU/L (ref 0–40)
Albumin: 4.4 g/dL (ref 3.5–4.8)
Alk. phosphatase: 103 IU/L (ref 39–117)
BUN/Creatinine ratio: 15 (ref 12–28)
BUN: 11 mg/dL (ref 8–27)
Bilirubin, total: 0.4 mg/dL (ref 0.0–1.2)
CO2: 25 mmol/L (ref 20–29)
Calcium: 9.1 mg/dL (ref 8.7–10.3)
Chloride: 106 mmol/L (ref 96–106)
Creatinine: 0.74 mg/dL (ref 0.57–1.00)
GFR est AA: 90 mL/min/{1.73_m2} (ref >59)
GFR est non-AA: 78 mL/min/{1.73_m2} (ref >59)
GLOBULIN, TOTAL: 2.4 g/dL (ref 1.5–4.5)
Glucose: 91 mg/dL (ref 65–99)
Potassium: 4.5 mmol/L (ref 3.5–5.2)
Protein, total: 6.8 g/dL (ref 6.0–8.5)
Sodium: 144 mmol/L (ref 134–144)

## 2017-04-13 LAB — CVD REPORT

## 2017-04-13 LAB — CBC W/O DIFF
HCT: 31.5 % — ABNORMAL LOW (ref 34.0–46.6)
HGB: 10.1 g/dL — ABNORMAL LOW (ref 11.1–15.9)
MCH: 28.9 pg (ref 26.6–33.0)
MCHC: 32.1 g/dL (ref 31.5–35.7)
MCV: 90 fL (ref 79–97)
PLATELET: 248 10*3/uL (ref 150–379)
RBC: 3.5 x10E6/uL — ABNORMAL LOW (ref 3.77–5.28)
RDW: 14.2 % (ref 12.3–15.4)
WBC: 3.8 10*3/uL (ref 3.4–10.8)

## 2017-04-13 LAB — LIPID PANEL
Cholesterol, total: 194 mg/dL (ref 100–199)
HDL Cholesterol: 58 mg/dL (ref >39)
LDL, calculated: 116 mg/dL — ABNORMAL HIGH (ref 0–99)
Triglyceride: 102 mg/dL (ref 0–149)
VLDL, calculated: 20 mg/dL (ref 5–40)

## 2017-04-13 LAB — TSH 3RD GENERATION: TSH: 8.79 u[IU]/mL — ABNORMAL HIGH (ref 0.450–4.500)

## 2017-04-14 MED ORDER — LEVOTHYROXINE 125 MCG TAB
125 mcg | ORAL_TABLET | Freq: Every day | ORAL | 1 refills | Status: DC
Start: 2017-04-14 — End: 2017-10-14

## 2017-04-14 NOTE — Telephone Encounter (Signed)
Left a message to inc from the 112 mcg dose ot 125 mcg dose every day and repeat levels in 3 mo

## 2017-04-14 NOTE — Progress Notes (Signed)
Notified by letter.  Inc from 112 mcg ot 125 mcg dose

## 2017-04-17 ENCOUNTER — Ambulatory Visit: Admit: 2017-04-17 | Discharge: 2017-04-17 | Payer: MEDICARE | Attending: Nurse Practitioner | Primary: Internal Medicine

## 2017-04-17 DIAGNOSIS — Z853 Personal history of malignant neoplasm of breast: Secondary | ICD-10-CM

## 2017-04-17 NOTE — Progress Notes (Signed)
[facility]  [address], [place]   [address]  W: [phone]   F: [phone]      F/u HEME/ONC CONSULT    Reason for visit: evaluation for treatment for breast cancer    Consulting physician: Dr. Gilford Rile    HPI:   Jillian Bean is a 76 y.o.  female who I was asked to see in consultation at the request of Dr. Elie Confer for evaluation for systemic therapy for breast cancer.    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 Right lumpectomy on 11/10/14 shows IDC, 1.7 cm, gr 3, 0/5 LN, no LVI.  PT1cN0Mx.    [place] q 3 weeks x 6: 12/21/14- 04/12/15  C#5 held 1 week on 03/15/15 due to thrombocytopenia   [place] Herceptin: 05/03/15-11/29/15    S/p XRT 06/03/15    Bone Marrow Bx 10/09/16: Variably normocellular marrow with trilineage hematopoiesis.    Interval History: In today for follow up. Complains of gr 1 constipation, gr 1 fatigue, gr 1 insomnia, gr 1 pain, gr 1 swelling.     She has been receiving abx for a hardware infection in her right knee for 8 weeks.     06/20/16 hgb 11.5  07/25/16 hgb 8.7  01/07/17 Hgb 7.1  01/09/17 Hgb 9.3   01/15/17 Hgb 9.0     DX   Encounter Diagnoses   Name Primary?   ??? History of breast cancer Yes   ??? Osteoporosis without current pathological fracture, unspecified osteoporosis type    ??? Anemia, normocytic normochromic    ??? Chronic right-sided low back pain with right-sided sciatica      Past Medical History:   Diagnosis Date   ??? Anemia    ??? Back pain 09/01/2007   ??? Cancer (HCC)     R breast   ??? Chronic pain     BACK PAIN   ??? Coagulation disorder ([place])     IRON DEFFICIENCY ANEMIA   ??? Colonic polyps 08/31/1998   ??? 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 DVT (deep venous thrombosis) ([place]) 01/2017   ??? Hiatal hernia 07/11/2011    Dr.sobieski    ??? Hypercholesteremia 09/01/2007   ??? Hypothyroidism 09/01/2003   ??? 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 Nausea & vomiting    ??? OA (osteoarthritis) 09/01/2007   ??? Other ill-defined conditions(799.89)     VERTIGO   ??? Plantar fasciitis 09/01/2007   ??? S/P colonoscopy 10/10/2007   ??? Thromboembolus ([place]) 01/2017    RIGHT LEG   ??? Thyroid disease      Past Surgical History:   Procedure Laterality Date   ??? COLONOSCOPY N/A 11/12/2016    COLONOSCOPY performed by Burnett Harry, MD at [facility]   ??? COLONOSCOPY,DIAGNOSTIC  11/12/2016        ??? ENDOSCOPY, COLON, DIAGNOSTIC  7/12/18/08    7/05 Dr Maudry Diego   ??? HX BREAST LUMPECTOMY Right 2016   ??? HX BREAST REDUCTION  1993   ??? HX COLONOSCOPY  2014    Dr Illene Silver   ??? HX GI      COLONOSCOPY   ??? HX HEENT  04/07/10    thyroid biopsy neg dr Leward Quan   ???  HX HEENT      wisdom teeth extraction   ??? HX HYSTERECTOMY  1975    PARTIAL   ??? HX KNEE REPLACEMENT Left 08/22/09   ??? HX KNEE REPLACEMENT Right 08/13/12   ??? HX ORTHOPAEDIC Right 11/26/2016    cleaned infection in joint   ??? HX OTHER SURGICAL  2013    MINIMALLY INVASIVE LUMBAR DECOMPRESSION   ??? NEUROLOGICAL PROCEDURE UNLISTED  2013    LUMBAR DECOMPRESSION   ??? UPPER GI ENDOSCOPY,BIOPSY  11/12/2016          Social History     Social History   ??? Marital status: MARRIED     Spouse name: N/A   ??? Number of children: N/A   ??? Years of education: N/A     Social History Main Topics   ??? Smoking status: Never Smoker   ??? Smokeless tobacco: Never Used   ??? Alcohol use No   ??? Drug use: No   ??? Sexual activity: Not Currently     Other Topics Concern   ??? None     Social History Narrative     Family History   Problem Relation Age of Onset   ??? Cancer Mother      Breast   ??? Hypertension Mother    ??? Heart Failure Father      MI   ??? Heart Attack Father    ??? Hypertension Father    ??? MS Brother    ??? Anesth Problems Neg Hx        Current Outpatient Prescriptions   Medication Sig Dispense Refill    ??? levothyroxine (SYNTHROID) 125 mcg tablet Take 1 Tab by mouth Daily (before breakfast). 90 Tab 1   ??? colesevelam (WELCHOL) 625 mg tablet Take 1 Tab by mouth two (2) times daily (with meals). 180 Tab 2   ??? apixaban (ELIQUIS) 5 mg tablet Take 1 Tab by mouth two (2) times a day. 180 Tab 1   ??? brimonidine-timolol (COMBIGAN) 0.2-0.5 % drop ophthalmic solution Administer 1 Drop to both eyes every twelve (12) hours.     ??? docusate sodium (COLACE) 100 mg capsule Take 100 mg by mouth daily.     ??? acetaminophen (TYLENOL EXTRA STRENGTH) 500 mg tablet Take 500 mg by mouth every six (6) hours as needed for Pain.     ??? polyethylene glycol (MIRALAX) 17 gram packet Take 17 g by mouth as needed.       Allergies   Allergen Reactions   ??? Atorvastatin Myalgia   ??? Zocor [Simvastatin] Other (comments) and Diarrhea     Other reaction(s): Adverse reaction to substance   Myalgias     ??? Celebrex [Celecoxib] Other (comments)     Aggitation   ??? Gabapentin Other (comments)     FELT UNSTEADY ON MY FEET   ??? Levaquin [Levofloxacin] Palpitations   ??? Nexium [Esomeprazole Magnesium] Diarrhea   ??? Other Medication Swelling     Latanoprost drops   ??? Oxycontin [Oxycodone] Itching   ??? Pravastatin Nausea and Vomiting   ??? Statins-Hmg-Coa Reductase Inhibitors Other (comments)     lymphacytic colitis     ??? Yellow Dye Hives       Review of Systems    A comprehensive review of systems was performed and all systems were negative except for HPI and for the symptom report form, reviewed and scanned in.    Objective:Physical Exam:  Visit Vitals   ??? BP 140/46   ??? Pulse 65   ???  Temp 97.9 ??F (36.6 ??C) (Temporal)   ??? Resp 18   ??? Ht '5\' 5"'  (1.651 m)   ??? Wt 138 lb 3.2 oz (62.7 kg)   ??? LMP 04/13/2010   ??? SpO2 99%   ??? BMI 23 kg/m2       General:  Alert, cooperative, no distress, appears stated age.   Head:  Normocephalic, without obvious abnormality, atraumatic.   Eyes:  Conjunctivae/corneas clear. PERRL, EOMs intact.   Throat: Lips, mucosa, and tongue normal.     Neck: Supple, symmetrical, trachea midline, no adenopathy, thyroid: no enlargement/tenderness/nodules   Back:   Symmetric, no curvature. ROM normal. No CVA tenderness.   Lungs:   Clear to auscultation bilaterally.   Chest wall:  No tenderness or deformity.   Heart:  Regular rate and rhythm, S1, S2 normal, no murmur, click, rub or gallop.   Breast Exam:  S/p R lumpectomy.    Abdomen:   Soft, non-tender. Bowel sounds normal. No masses,  No organomegaly.   Extremities: Right leg 2+ edema, brace present         Diagnostic Imaging     12/16/14 TTE: EF 71%.  03/02/15 TTE: EF 68%  05/30/15 TTE :  EF 65%  07/21/15 TTE: EF 66%  10/06/15 TTE: EF 62%  05/23/16 TTE EF 60%    03/15/16 dexa  Findings:  ????  Fractures identified on Lateral scanogram:  None  ????  Femoral Neck:  Right  Bone mineral density (gm/cm2):? 0.649  % of peak bone mass: 63  % for age matched controls:? 86  T-score: -2.8  Z-score: -0.8  ????  Total Hip: Right  Bone mineral density (gm/cm2):  0.761  % of peak bone mass:   76  % for age matched controls:  98  T-score:   -2.0  Z-score:  -0.1  ??  Lumbar Spine:  L1-L4  Bone mineral density (gm/cm2):  1.211  % of peak bone mass:  101   % for age matched controls:  124  T-score:  0.1  Z-score:  2.0  ??  The T score for the left distal third radius is -1.4.  ????  IMPRESSION  Impression:  ????  This patient is osteoporotic using the World Health Organization criteria  As compared to the prior study, there has been no change in the bone mineral  density of the right total hip and an increase in the bone mineral density of  the lumbar spine of 2.0%.  10 year probability of major osteoporotic fracture:  19.6%  10 year probability of hip fracture:  7.4%    09/20/16 CT c/a/p:  IMPRESSION:  No acute process in the chest, abdomen, and pelvis.  ??    Lab Results  Lab Results   Component Value Date/Time    WBC 3.8 04/12/2017 09:11 AM    HGB 10.1 (L) 04/12/2017 09:11 AM    HCT 31.5 (L) 04/12/2017 09:11 AM    PLATELET 248 04/12/2017 09:11 AM     MCV 90 04/12/2017 09:11 AM     Lab Results   Component Value Date/Time    Sodium 144 04/12/2017 09:11 AM    Potassium 4.5 04/12/2017 09:11 AM    Chloride 106 04/12/2017 09:11 AM    CO2 25 04/12/2017 09:11 AM    Anion gap 12 03/19/2017 03:55 AM    Glucose 91 04/12/2017 09:11 AM    BUN 11 04/12/2017 09:11 AM    Creatinine 0.74 04/12/2017 09:11 AM    BUN/Creatinine  ratio 15 04/12/2017 09:11 AM    GFR est AA 90 04/12/2017 09:11 AM    GFR est non-AA 78 04/12/2017 09:11 AM    Calcium 9.1 04/12/2017 09:11 AM    AST (SGOT) 13 04/12/2017 09:11 AM    Alk. phosphatase 103 04/12/2017 09:11 AM    Protein, total 6.8 04/12/2017 09:11 AM    Albumin 4.4 04/12/2017 09:11 AM    Globulin 3.6 11/28/2016 05:25 AM    A-G Ratio 1.8 04/12/2017 09:11 AM    ALT (SGPT) 10 04/12/2017 09:11 AM       Lab Results   Component Value Date/Time    Reticulocyte count 1.5 09/05/2016 09:40 AM    Iron % saturation 22 07/30/2016 09:31 AM    TIBC 161 (L) 07/30/2016 09:31 AM    Ferritin 1982 (H) 07/30/2016 09:31 AM    Vitamin B12 542 07/30/2016 09:31 AM    Folate 14.8 07/30/2016 09:31 AM    Haptoglobin 412 (H) 09/05/2016 09:40 AM    LD 192 09/05/2016 09:40 AM    Sed rate, automated 67 (H) 11/28/2016 05:25 AM    C-Reactive protein 21.70 (H) 11/28/2016 05:25 AM    TSH 8.790 (H) 04/12/2017 09:11 AM     Lab Results   Component Value Date/Time    INR 1.0 02/15/2017 03:11 PM    aPTT 28.7 08/18/2009 01:05 PM       Assessment/Plan:  76 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  PS 0    1. Right Breast cancer stage: IA    Hormonal therapy: not indicated due to receptor (-) status    We explained to the patient that the goal of systemic adjuvant therapy is to improve the chances for cure and decrease the risk of relapse. We explained why a patient can have microscopic cancer spread now even though physical examination, laboratory studies and imaging studies are negative  for cancer. We explained that the same treatments used now as adjuvant or preventive treatments rarely if ever are curative in women who develop metastases.     No evidence of recurrence    Mammogram in March 2018 negative at [facility] breast center, due in March 2019    She declined to take neratinib.    2. Emotional well being: She has excellent support and is coping well with her disease.    3. FH of breast cancer: BRCA 1/2 [place] testing negative    4. Osteoporosis: On dexa from 2015; is now on fosamax from Dr. Wanda Plump, started 08/2016. DEXA 03/15/16 showed some improvement    5. Back pain low/radiculopathy: Dr. Dema Severin, pain specialist, was following her. NM bone scan and xr show degeneration. Steroid injection in of Octobert; L-spine MRI 01/19/16 shows DDD.  States last steroid injection 09/24/16.     6. Anemia: Hgb 10.1 on 04/12/17, improving. Normocytic; unclear as to drop since Nov, likely due to chemo as now is improving. Elevated ferritin as well.  No B12 def, normal folate, normal iron studies.     The differential diagnosis for a normocytic anemia is broad, and includes blood loss, anemia of chronic disease, chronic renal insufficiency, iron deficiency, hypothyroidism, hemolysis, and bone marrow suppression. MDS, B12 deficiency, folate deficiency, and etoh abuse can also lead to anemia, though it is generally macrocytic.      Bone Marrow bx 10/02/16: normal. Referred to GI -- Dr. Alita Chyle, on 11/12/16 EGD showed atrophic gastritis and hiatal  hernia and colonoscopy showed int hemorrhoids and diverticulosis.      Recently had knee infection/septic arthritis (staph) in April 2018 which resulted in removal of old knee replacement. Is seeing Dr. Janyth Pupa in [place] at [facility].    Anemia improving, but unclear cause.  Unlikely related to chemotherapy as this occurred a year from chemo.  May have been a episode of gastritis?  Previously recommended capsule study at some point with Dr. Alita Chyle.      Repeat CBC with Dr. Wanda Plump in 3 months.     7. R leg DVT: provoked, in non weight bearing leg s/p surgery. Currently on Eliquis 5 mg bid started on 01/22/17. Advised her to stop baby aspirin. Switched to Lovenox around time of surgery. Will plan to continue anticoagulation for at least 3 months, or while her leg is immobile. Dr. Wanda Plump would like her to continue Eliqius until December.     Thank you for this consult.  All of the patient's questions were answered today.    Follow-up Disposition:  Return for 29mfu, Conna Terada.    WSonda Rumble MD

## 2017-04-17 NOTE — Progress Notes (Signed)
Jillian Bean is a 76 y.o. female here for follow-up of breast cancer.

## 2017-04-21 ENCOUNTER — Encounter

## 2017-04-21 MED ORDER — RANITIDINE 150 MG TAB
150 mg | ORAL_TABLET | ORAL | 5 refills | Status: DC
Start: 2017-04-21 — End: 2017-10-11

## 2017-04-29 NOTE — Telephone Encounter (Signed)
Pt call in and wanted to know if she should go in antibiotics before having nails cut? Please call her at [phone]

## 2017-04-29 NOTE — Telephone Encounter (Signed)
Patient is scheduled to see podiatry tomorrow & wasn't sure if she needed to take antibiotics prior due to her recent knee surgery. Patient advised to contact her surgeon's office for advice on this. Patient voiced understanding.

## 2017-05-01 NOTE — Progress Notes (Signed)
Patient has graduated from the Complex Case Management  program on 05/01/17.  Patient's symptoms are stable at this time.  Patient/family has the ability to self-manage.   Care management goals have been completed at this time. No further nurse navigator follow up scheduled.    Goals Addressed     None          Pt has nurse navigator's contact information for any further questions, concerns, or needs.  Patients upcoming visits:  Future Appointments  Date Time Provider [place]   05/02/2017 9:30 AM RAD ONC THERAPY [id number]   05/08/2017 11:00 AM [place]   10/15/2017 11:30 AM Joyce Gross, MD [place]

## 2017-05-02 ENCOUNTER — Inpatient Hospital Stay: Admit: 2017-05-02 | Primary: Internal Medicine

## 2017-05-08 ENCOUNTER — Inpatient Hospital Stay: Admit: 2017-05-08 | Payer: MEDICARE | Primary: Internal Medicine

## 2017-05-08 DIAGNOSIS — I890 Lymphedema, not elsewhere classified: Secondary | ICD-10-CM

## 2017-05-08 NOTE — Progress Notes (Signed)
[facility]  [place]  [address]      OUTPATIENT physical Therapy Evaluation with CMS G codes    NAME: Jillian Bean AGE: 76 y.o.  GENDER: female  DATE: 05/08/2017  REFERRING PHYSICIAN: Michaele Offer, MD   HISTORY AND BACKGROUND:This patient is a 76 y/o female with recent history of breast cancer, right, as noted below.  Pt states Dr. Elie Confer referred her for initial evaluation of potential lymphedema.  Pt without c/o swelling, shoulder dysfunction, or pain at this time. Pt reports port was placed 12/20/2014, and chemotherapy initiated 12/21/2014.  Currently continued with Herceptin infusions thru May 2017. 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  TKR revision on 03/18/2017, Pt returns for assessment regarding lymphedema risk reduction.  Information from Dr. Valinda Hoar note as follows:  An abnormal mammogram led to a right core breast biopsy on 10/12/14 showing IDC, 7 mm, gr 3, ER negative, PR negative, ki67 15%, HER 2 positive (IHC 2+; FISH ratio 2.8; sig/cell 5.7).?? Right lumpectomy on 11/10/14 shows IDC, 1.7 cm, gr 3, 0/5 LN, no LVI.?? [id number].    Primary Diagnosis:  ?? 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 Malignant neoplasm of breast (80.1)  Date of Onset: 11/10/2014  Present Symptoms and Functional Limitations: s/p lumpectomy and SLNB R 11/10/2014.  Returns for assessment regarding lymphedema risk reduction.  LLIS: 0/72; [place]   Past Medical History:   Past Medical History:   Diagnosis Date   ??? Anemia    ??? Back pain 09/01/2007   ??? Cancer (HCC)     R breast   ??? Chronic pain     BACK PAIN   ??? Coagulation disorder ([place])     IRON DEFFICIENCY ANEMIA   ??? Colonic polyps 08/31/1998   ??? 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 DVT (deep venous thrombosis) ([place]) 01/2017   ??? Hiatal hernia 07/11/2011    Dr.sobieski   ??? Hypercholesteremia 09/01/2007   ??? Hypothyroidism 09/01/2003   ??? 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 Nausea & vomiting    ??? OA (osteoarthritis) 09/01/2007   ??? Other ill-defined conditions(799.89)     VERTIGO   ??? Plantar fasciitis 09/01/2007   ??? S/P colonoscopy 10/10/2007   ??? Thromboembolus ([place]) 01/2017    RIGHT LEG   ??? Thyroid disease      Past Surgical History:   Procedure Laterality Date   ??? COLONOSCOPY N/A 11/12/2016    COLONOSCOPY performed by Burnett Harry, MD at [facility]   ??? COLONOSCOPY,DIAGNOSTIC  11/12/2016        ??? ENDOSCOPY, COLON, DIAGNOSTIC  7/12/18/08    7/05 Dr Maudry Diego   ??? HX BREAST LUMPECTOMY Right 2016   ??? HX BREAST REDUCTION  1993   ??? HX COLONOSCOPY  2014    Dr Illene Silver   ??? HX GI      COLONOSCOPY   ??? HX HEENT  04/07/10    thyroid biopsy neg dr Leward Quan   ??? HX HEENT      wisdom teeth extraction   ??? HX HYSTERECTOMY  1975    PARTIAL   ??? HX KNEE REPLACEMENT Left 08/22/09   ??? HX KNEE REPLACEMENT Right 08/13/12   ??? HX ORTHOPAEDIC Right 11/26/2016  cleaned infection in joint   ??? HX OTHER SURGICAL  2013    MINIMALLY INVASIVE LUMBAR DECOMPRESSION   ??? NEUROLOGICAL PROCEDURE UNLISTED  2013    LUMBAR DECOMPRESSION   ??? UPPER GI ENDOSCOPY,BIOPSY  11/12/2016          Current Medications:    Current Outpatient Prescriptions   Medication Sig   ??? raNITIdine (ZANTAC) 150 mg tablet TAKE 1 TABLET BY MOUTH TWICE DAILY   ??? levothyroxine (SYNTHROID) 125 mcg tablet Take 1 Tab by mouth Daily (before breakfast).   ??? colesevelam (WELCHOL) 625 mg tablet Take 1 Tab by mouth two (2) times daily (with meals).   ??? acetaminophen (TYLENOL EXTRA STRENGTH) 500 mg tablet Take 500 mg by mouth every six (6) hours as needed for Pain.   ??? polyethylene glycol (MIRALAX) 17 gram packet Take 17 g by mouth as needed.   ??? apixaban (ELIQUIS) 5 mg tablet Take 1 Tab by mouth two (2) times a day.    ??? brimonidine-timolol (COMBIGAN) 0.2-0.5 % drop ophthalmic solution Administer 1 Drop to both eyes every twelve (12) hours.   ??? docusate sodium (COLACE) 100 mg capsule Take 100 mg by mouth daily.     No current facility-administered medications for this encounter.      Allergies:   Allergies   Allergen Reactions   ??? Atorvastatin Myalgia   ??? Zocor [Simvastatin] Other (comments) and Diarrhea     Other reaction(s): Adverse reaction to substance   Myalgias     ??? Celebrex [Celecoxib] Other (comments)     Aggitation   ??? Gabapentin Other (comments)     FELT UNSTEADY ON MY FEET   ??? Levaquin [Levofloxacin] Palpitations   ??? Nexium [Esomeprazole Magnesium] Diarrhea   ??? Other Medication Swelling     Latanoprost drops   ??? Oxycontin [Oxycodone] Itching   ??? Pravastatin Nausea and Vomiting   ??? Statins-Hmg-Coa Reductase Inhibitors Other (comments)     lymphacytic colitis     ??? Yellow Dye Hives      Social/Work History and Prior Level of Function: Pt is retired from [employer].  No reported function limitations prior to breast cancer diagnosis/treatment.   Living Situation: lives with spouse.      Trainable Caregiver?:as needed   Self-care/ADLs: indep     Mobility: Gait with straight cane, antalgic gait pattern, with patient currently receiving outpatient PT for mobility and strength training.   Sleeping Arrangement:  bed   Adaptive Equipment Owned: walker/cane.   Other: na  Previous Therapy:  None to address potential for lymphedema.  Compression/Lymphedema Equipment:  Compression sleeve/gauntlet fit in clinic prior visit.    SUBJECTIVE:   Pt reports she is doing well.  States she has not worn compression sleeve over the past year due to other medical concerns.  Agrees she will continue using compression sleeve as follows: before/during/after air travel, repetitive UE activities, exercise.  Pt agreeable to proceeding with ordering replacement compression sleeve/gauntlet today.   Patient???s goals for therapy: R UE assessed for lymphedema.    EVALUATION AND OBJECTIVE DATA SUMMARY:   Pain:no c/o pain  Pain Scale 1: Numeric (0 - 10)  Pain Intensity 1: 0     Skin and Tissue Assessment:  Dermal Status:  (x )  Intact ( )  Dry   ( )  Tenuous ( )  Flaky   ( )  Wound/lesion present ( )  Scars:    ( )  Dermatitis    Texture/Consistency: na  ( )  Boggy ( )  Pitting Edema   ( )  Brawny ( )  Combination   ( )  Fibrotic/Woody    Pigmentation/Color Change:  (x )  Normal ( )  Hemosiderin   ( )  Red ( )  Erythematous   ( )  Hyperpigmented ( )  Hyperlipodermatosclerosis   Anomalies: na  ( )  Lymphorrhea ( )  Vesicles   ( )  Petechiae ( )  Warty Vercusis   ( )  Bullae ( )  Papilloma     Nails:  (x )  Normal  ( )  Fungus  Stemmers Sign: negative  Height:  Height: '5\' 5"'  (165.1 cm)  Weight:  Weight: 62.1 kg (137 lb)   BMI:  BMI (calculated): 22.8  (36 or greater: adversely affecting lymphedema)  Volumetric Measurements:   Right:  1714.99 mL Left:  1755.67 mL   % Difference: -2.32% Dominance: Right   (See scanned graph)  Range of Motion: Bilateral UE grossly WFL.  Sensation:  Intact  Mobility:  Bed/Chair Mobility:  indep Transfers:  indep   Sitting Balance:  good Standing Balance:  good   Gait:  indep community distances without device.  Pt walks regularly for exercise. Wheelchair Mobility:  na   Endurance:  Intact for daily activities, walking program Stairs:  Not assessed.       Safety:  Patient is alert and oriented:  x4   Safety awareness:  intact   Fall Risk?:  low   Patient given written fall prevention handout: Yes   Precautions:  Standard lymphedema precautions to include avoiding blood pressure readings, injections and IVs or other procedures/acts that could lead to broken skin on affected area, and avoiding excessive heat, resistive activity or altitude without compression garment    Evaluation Time: 11:04-1129 am  21 minutes    TREATMENT PROVIDED:    1.  Treatment description:  The patient was instructed in lymphedema risk reduction tools, including wear of compression garment for moderate to high risk activities, as well as skin care to prevent infection.  Pt was instructed in the benefit of wear of compression garments to involved extremity for wear with exercise, air travel, repetitive UE activities.. Pt advised to notify clinic of any concerns that arise regarding lymphedema, encouraged to monitor UE girth measurements monthly with booklet provided for recording previous visit. Pt instructed to continue skin care, including cleansing skin daily involved quadrant/UE, and applying low pH lotion to extremity using upward strokes. Measurements completed for UE compression garments due to age and wear of current garments.  Pt demonstrates and verbalizes good understanding of information provided in activity today.  Pt advised to monitor dorsal hand, fit of clothing, seeking attention at this clinic as needed if any noted changes present.  Treatment time:  11:31-11:56 am  Minutes: 25 minutes  Therapeutic activity 2        ASSESSMENT:   GIFT RUECKERT is a 76 y.o. female who presents with stage 0 lymphedema, R UE, with limb volume and circumferential axillary measurement, R UE compared with L UE are WNL.  Patient has been educated regarding lymphedema risk reduction, including fitting compression sleeve/gauntlet prior visit, to be worn with high risk UE activities.  Pt is agreeable to proceeding with compression garment wear during high risk activities, skin care, bi-monthly circumference measurements R UE, seeking medical attention as needed.  Patient to return in 12 months as needed to ensure limb volumes/circumferential measurements remain stable, with plan to discharge  from care at time as indicated.    This care is medically necessary due to the infection risk with lymphedema, and to improve functional activities.  CDT is necessary to  resolve swelling to allow patient to return to wearing normal clothes/footwear, and prevent worsening of symptoms, such as venous stasis ulcerations, infections, or hospitalizations.  Patient will be independent with home program strategies to allow improved ADL ability and mobility and to allow patient to return to greatest functional independence.    Rehabilitation potential is considered to be Excellent.  Factors which may influence rehabilitation potential include recent history of breast cancer, 0/5 involved, however, no measurable or visible swelling noted R UE.  No further treatment recommended at this time, with pt to follow up with clinic in 1 year as needed, or prior to that time if R UE swelling onset occurs.  Pt to fit replacement compression garments upon receiving, notifying clinic of any questions or concerns, with fit to be assessed at that time. Pt advised in conditions under which to return to clinic prior to scheduled appt.         In compliance with CMS???s Claims Based Outcome Reporting, the following G-code set was chosen for this patient based on their primary functional limitation being treated:    The outcome measure chosen to determine the severity of the functional limitation was the LLIS  with a score of 0/72 which was correlated with the impairment scale.    ? Other PT/OT Primary Functional Limitations:    [phone] - CURRENT STATUS: CH - 0% impaired, limited or restricted   G8991 - GOAL STATUS: CH - 0% impaired, limited or restricted   B1478 - D/C STATUS:  CH - 0% impaired, limited or restricted             Patient has participated in goal setting and agrees to work toward plan of care.  Patient was instructed to call if questions or concerns arise.    Thank you for this referral.  Kalman Shan, PT, CLT    Time Calculation: 47 mins    TREATMENT PLAN EFFECTIVE DATES:   05/08/2017   I have read the above plan of care for Jillian Bean.  I certify the  above prescribed services are required by this patient and are medically necessary.  The above plan of care has been developed in conjunction with the lymphedema/physical therapist.       Physician Signature: ____________________________________Date:______________

## 2017-05-27 NOTE — Telephone Encounter (Signed)
From: Duane Lope  To: [name], MD  Sent: 05/27/2017 4:45 PM EDT  Subject:  Update Medical Information    May 24, 2017 - received the Fluzone High Dose 2018-19 0.5 ML SYR at [facility]. Please update medical records.

## 2017-07-22 ENCOUNTER — Inpatient Hospital Stay: Admit: 2017-09-25 | Payer: MEDICARE | Primary: Internal Medicine

## 2017-07-22 ENCOUNTER — Encounter

## 2017-07-22 ENCOUNTER — Ambulatory Visit: Admit: 2017-07-22 | Discharge: 2017-07-22 | Payer: MEDICARE | Attending: Internal Medicine | Primary: Internal Medicine

## 2017-07-22 ENCOUNTER — Encounter: Attending: Internal Medicine | Primary: Internal Medicine

## 2017-07-22 DIAGNOSIS — D508 Other iron deficiency anemias: Secondary | ICD-10-CM

## 2017-07-22 DIAGNOSIS — E785 Hyperlipidemia, unspecified: Secondary | ICD-10-CM

## 2017-07-22 NOTE — Progress Notes (Signed)
I told her I would share the labs with you

## 2017-07-22 NOTE — Telephone Encounter (Signed)
Saw her today ordering labs- I think she can stop eliquis but she wanted to hear from you to make sure

## 2017-07-22 NOTE — Telephone Encounter (Signed)
Discussed will cont eliquis thru dec due to travel and then stop in [place]

## 2017-07-22 NOTE — Progress Notes (Signed)
HISTORY OF PRESENT ILLNESS  Jillian Bean is a 76 y.o. female.  HPI   She is feeling better after surgery- she has not been taking iron - they think the reason the anemia came in was due to the loss of blood during surgery and sick- int he past she ahd been using iron infusion and she did need procrit infusions but Dr.Irvin     SUBJECTIVE: Jillian Bean is a 76 y.o. female here for follow up of hypothyroidism.    Lab Results   Component Value Date/Time    TSH 8.790 (H) 04/12/2017 09:11 AM     Thyroid ROS: denies fatigue, weight changes, heat/cold intolerance, bowel/skin changes or CVS symptoms.     Review of Systems   Constitutional: Negative.  Negative for chills, diaphoresis, fever, malaise/fatigue and weight loss.   HENT: Negative for congestion, nosebleeds and tinnitus.    Eyes: Negative for blurred vision, double vision and photophobia.   Respiratory: Negative for cough, hemoptysis, sputum production, shortness of breath and wheezing.    Cardiovascular: Negative for chest pain, palpitations, orthopnea, claudication, leg swelling and PND.   Gastrointestinal: Negative for abdominal pain, blood in stool, constipation, diarrhea, heartburn, melena, nausea and vomiting.   Genitourinary: Negative for dysuria, frequency, hematuria and urgency.   Musculoskeletal: Negative for back pain, joint pain, myalgias and neck pain.   Skin: Negative for itching and rash.   Neurological: Negative for dizziness, tingling, sensory change, speech change, focal weakness, weakness and headaches.   Endo/Heme/Allergies: Negative for polydipsia. Does not bruise/bleed easily.   Psychiatric/Behavioral: Negative for depression. The patient is not nervous/anxious and does not have insomnia.        Physical Exam   Constitutional: She is oriented to person, place, and time. She appears well-developed and well-nourished.   HENT:   Head: Normocephalic and atraumatic.   Right Ear: External ear normal.   Left Ear: External ear normal.    Nose: Nose normal.   Mouth/Throat: Oropharynx is clear and moist.   Neck: Normal range of motion. Neck supple. No JVD present. Carotid bruit is not present. No thyroid mass and no thyromegaly present.   Cardiovascular: Normal rate, regular rhythm, S1 normal, S2 normal, normal heart sounds, intact distal pulses and normal pulses. Exam reveals no gallop and no friction rub.   No murmur heard.  Pulmonary/Chest: Effort normal and breath sounds normal.   Abdominal: Soft. Bowel sounds are normal.   Musculoskeletal: Normal range of motion.   Neurological: She is alert and oriented to person, place, and time. She has normal strength.   Skin: Skin is warm and dry.   Psychiatric: She has a normal mood and affect. Her behavior is normal. Judgment and thought content normal.   Nursing note and vitals reviewed.      ASSESSMENT and PLAN  Diagnoses and all orders for this visit:    1. Dyslipidemia-cont current dose of medicine   -     METABOLIC PANEL, COMPREHENSIVE  -     LIPID PANEL    2. Acquired hypothyroidism=-stable on current dose   -     TSH 3RD GENERATION  -     T4, FREE    3. Other iron deficiency anemia- she has not been taking iron and we think recent anemia was due to surgery and infection  -     CBC WITH AUTOMATED DIFF  -     IRON PROFILE  -     FERRITIN    This note  will not be viewable in [facility].    She will discuss with Dr.GLynn about stopping eliquis as she has been on it for 6 months    lab results and schedule of future lab studies reviewed with patient  reviewed diet, exercise and weight control  cardiovascular risk and specific lipid/LDL goals reviewed  reviewed medications and side effects in detail

## 2017-07-23 LAB — METABOLIC PANEL, COMPREHENSIVE
A-G Ratio: 2 (ref 1.2–2.2)
ALT (SGPT): 10 IU/L (ref 0–32)
AST (SGOT): 15 IU/L (ref 0–40)
Albumin: 4.5 g/dL (ref 3.5–4.8)
Alk. phosphatase: 81 IU/L (ref 39–117)
BUN/Creatinine ratio: 16 (ref 12–28)
BUN: 13 mg/dL (ref 8–27)
Bilirubin, total: 0.4 mg/dL (ref 0.0–1.2)
CO2: 24 mmol/L (ref 20–29)
Calcium: 9.2 mg/dL (ref 8.7–10.3)
Chloride: 104 mmol/L (ref 96–106)
Creatinine: 0.79 mg/dL (ref 0.57–1.00)
GFR est AA: 84 mL/min/{1.73_m2} (ref >59)
GFR est non-AA: 72 mL/min/{1.73_m2} (ref >59)
GLOBULIN, TOTAL: 2.2 g/dL (ref 1.5–4.5)
Glucose: 100 mg/dL — ABNORMAL HIGH (ref 65–99)
Potassium: 4.4 mmol/L (ref 3.5–5.2)
Protein, total: 6.7 g/dL (ref 6.0–8.5)
Sodium: 142 mmol/L (ref 134–144)

## 2017-07-23 LAB — TSH 3RD GENERATION: TSH: 1.98 u[IU]/mL (ref 0.450–4.500)

## 2017-07-23 LAB — CBC WITH AUTOMATED DIFF
ABS. BASOPHILS: 0 10*3/uL (ref 0.0–0.2)
ABS. EOSINOPHILS: 0.1 10*3/uL (ref 0.0–0.4)
ABS. IMM. GRANS.: 0 10*3/uL (ref 0.0–0.1)
ABS. MONOCYTES: 0.5 10*3/uL (ref 0.1–0.9)
ABS. NEUTROPHILS: 5.1 10*3/uL (ref 1.4–7.0)
Abs Lymphocytes: 1 10*3/uL (ref 0.7–3.1)
BASOPHILS: 0 %
EOSINOPHILS: 2 %
HCT: 35.8 % (ref 34.0–46.6)
HGB: 12.1 g/dL (ref 11.1–15.9)
IMMATURE GRANULOCYTES: 0 %
Lymphocytes: 15 %
MCH: 30.1 pg (ref 26.6–33.0)
MCHC: 33.8 g/dL (ref 31.5–35.7)
MCV: 89 fL (ref 79–97)
MONOCYTES: 7 %
NEUTROPHILS: 76 %
PLATELET: 228 10*3/uL (ref 150–379)
RBC: 4.02 x10E6/uL (ref 3.77–5.28)
RDW: 13.6 % (ref 12.3–15.4)
WBC: 6.7 10*3/uL (ref 3.4–10.8)

## 2017-07-23 LAB — LIPID PANEL
Cholesterol, total: 217 mg/dL — ABNORMAL HIGH (ref 100–199)
HDL Cholesterol: 57 mg/dL (ref >39)
LDL, calculated: 138 mg/dL — ABNORMAL HIGH (ref 0–99)
Triglyceride: 111 mg/dL (ref 0–149)
VLDL, calculated: 22 mg/dL (ref 5–40)

## 2017-07-23 LAB — IRON PROFILE
Iron % saturation: 18 % (ref 15–55)
Iron: 35 ug/dL (ref 27–139)
TIBC: 198 ug/dL — ABNORMAL LOW (ref 250–450)
UIBC: 163 ug/dL (ref 118–369)

## 2017-07-23 LAB — CVD REPORT

## 2017-07-23 LAB — T4, FREE: T4, Free: 1.65 ng/dL (ref 0.82–1.77)

## 2017-07-23 LAB — FERRITIN: Ferritin: 1447 ng/mL — ABNORMAL HIGH (ref 15–150)

## 2017-10-11 ENCOUNTER — Ambulatory Visit: Admit: 2017-10-11 | Discharge: 2017-10-11 | Payer: MEDICARE | Attending: Internal Medicine | Primary: Internal Medicine

## 2017-10-11 ENCOUNTER — Inpatient Hospital Stay: Admit: 2017-11-29 | Payer: MEDICARE | Primary: Internal Medicine

## 2017-10-11 ENCOUNTER — Inpatient Hospital Stay: Admit: 2017-10-14 | Payer: MEDICARE | Primary: Internal Medicine

## 2017-10-11 DIAGNOSIS — Z0000 Encounter for general adult medical examination without abnormal findings: Secondary | ICD-10-CM

## 2017-10-11 DIAGNOSIS — E034 Atrophy of thyroid (acquired): Secondary | ICD-10-CM

## 2017-10-11 DIAGNOSIS — Z9189 Other specified personal risk factors, not elsewhere classified: Secondary | ICD-10-CM

## 2017-10-11 MED ORDER — COLESEVELAM 625 MG TAB
625 mg | ORAL_TABLET | ORAL | 2 refills | Status: DC
Start: 2017-10-11 — End: 2017-10-21

## 2017-10-11 NOTE — Progress Notes (Signed)
HISTORY OF PRESENT ILLNESS  Jillian Bean is a 77 y.o. female.  HPI  Seen for Medicare wellness, as well as follow up on issues.  She is on Synthroid 125 mcg.  No symptoms of hypo or hyperthyroidism.  She has been on Welchol, two tablets daily, and most recent lipid panel LDL was still elevated, so we are going to bump from 2 to 3 a day.  She has had previous iron deficiency anemia.  Labs in December looked good.  Would like it rechecked.  She has seen Dr. Laurena Bering for the iron deficiency.  She has had a history of breast cancer, followed by Dr. Lyndel Safe and Dr. Derald Macleod Deal and is up to date on mammograms.  She had a tough year with her right knee replacement, which got infected and made it a redo by Dr. Elvin So, but this is finally all improved and she is back to exercising and walking and looking forward to a trip to [place] in the next few months.  She is not requiring any pain meds.      Review of Systems   Constitutional: Positive for malaise/fatigue. Negative for chills, diaphoresis, fever and weight loss.   HENT: Negative for hearing loss and sinus pain.    Respiratory: Negative for cough, shortness of breath and wheezing.    Cardiovascular: Negative for chest pain, palpitations, orthopnea, leg swelling and PND.   Gastrointestinal: Negative for abdominal pain, constipation, diarrhea, heartburn, nausea and vomiting.   Genitourinary: Negative for dysuria and frequency.   Musculoskeletal: Negative for falls, joint pain and myalgias.   Skin: Negative for rash.   Neurological: Negative for dizziness, sensory change, focal weakness and headaches.   Psychiatric/Behavioral: Negative for depression. The patient does not have insomnia.        Physical Exam   Constitutional: She is oriented to person, place, and time. She appears well-developed and well-nourished.   HENT:   Head: Normocephalic and atraumatic.   Right Ear: Tympanic membrane, external ear and ear canal normal.    Left Ear: Tympanic membrane, external ear and ear canal normal.   Nose: Nose normal.   Mouth/Throat: Oropharynx is clear and moist and mucous membranes are normal. No oropharyngeal exudate.   Eyes: Conjunctivae are normal. Pupils are equal, round, and reactive to light. Right eye exhibits no discharge. Left eye exhibits no discharge.   Neck: Normal range of motion. Neck supple. Carotid bruit is not present. No thyromegaly present.   Cardiovascular: Normal rate, regular rhythm, S1 normal, S2 normal, normal heart sounds and intact distal pulses.   No murmur heard.  Pulmonary/Chest: Effort normal and breath sounds normal. No respiratory distress. She has no wheezes. She has no rales.   Right breast with healed scar no masses axillary nodes or discharge left breast no masses axillary nodes or discharge healed scar upper left from port   Abdominal: Soft. Bowel sounds are normal. She exhibits no distension and no mass. There is no tenderness.   Genitourinary: Vagina normal. No vaginal discharge found.   Genitourinary Comments: Pap from vaginal cuff taken   Musculoskeletal: She exhibits edema (1 plus).   Lymphadenopathy:     She has no cervical adenopathy.   Neurological: She is alert and oriented to person, place, and time.   Psychiatric: She has a normal mood and affect. Her behavior is normal.   Nursing note and vitals reviewed.      ASSESSMENT and PLAN  Diagnoses and all orders for this visit:  1. Medicare annual wellness visit, subsequent    2. Hypercholesterolemia  -     colesevelam (WELCHOL) 625 mg tablet; 3 po qd  -     METABOLIC PANEL, COMPREHENSIVE    3. Hypothyroidism due to acquired atrophy of thyroid  -     TSH 3RD GENERATION    4. Iron deficiency anemia due to chronic blood loss  -     CBC WITH AUTOMATED DIFF    5. GYN exam for high-risk Medicare patient  -     PAP (IMAGE GUIDED), LIQUID-BASED

## 2017-10-11 NOTE — Patient Instructions (Signed)
Medicare Wellness Visit, Female     The best way to live healthy is to have a lifestyle where you eat a well-balanced diet, exercise regularly, limit alcohol use, and quit all forms of tobacco/nicotine, if applicable.   Regular preventive services are another way to keep healthy. Preventive services (vaccines, screening tests, monitoring & exams) can help personalize your care plan, which helps you manage your own care. Screening tests can find health problems at the earliest stages, when they are easiest to treat.   [facility] follows the current, evidence-based guidelines published by the [place] [employer] (USPSTF) when recommending preventive services for our patients. Because we follow these guidelines, sometimes recommendations change over time as research supports it. (For example, mammograms used to be recommended annually. Even though Medicare will still pay for an annual mammogram, the newer guidelines recommend a mammogram every two years for women of average risk.)  Of course, you and your doctor may decide to screen more often for some diseases, based on your risk and your health status.   Preventive services for you include:  - Medicare offers their members a free annual wellness visit, which is time for you and your primary care provider to discuss and plan for your preventive service needs. Take advantage of this benefit every year!  -All adults over the age of 62 should receive the recommended pneumonia vaccines. Current USPSTF guidelines recommend a series of two vaccines for the best pneumonia protection.   -All adults should have a flu vaccine yearly and a tetanus vaccine every 10 years. All adults age 29 and older should receive a shingles vaccine once in their lifetime.    -A bone mass density test is recommended when a woman turns 65 to screen for osteoporosis. This test is only recommended one time, as a screening.  Some providers will use this same test as a disease monitoring tool if you already have osteoporosis.  -All adults age 75-70 who are overweight should have a diabetes screening test once every three years.   -Other screening tests and preventive services for persons with diabetes include: an eye exam to screen for diabetic retinopathy, a kidney function test, a foot exam, and stricter control over your cholesterol.   -Cardiovascular screening for adults with routine risk involves an electrocardiogram (ECG) at intervals determined by your doctor.   -Colorectal cancer screenings should be done for adults age 61-75 with no increased risk factors for colorectal cancer.  There are a number of acceptable methods of screening for this type of cancer. Each test has its own benefits and drawbacks. Discuss with your doctor what is most appropriate for you during your annual wellness visit. The different tests include: colonoscopy (considered the best screening method), a fecal occult blood test, a fecal DNA test, and sigmoidoscopy.  -Breast cancer screenings are recommended every other year for women of normal risk, age 67-74.  -Cervical cancer screenings for women over age 77 are only recommended with certain risk factors.   -All adults born between [place] should be screened once for Hepatitis C.     Here is a list of your current Health Maintenance items (your personalized list of preventive services) with a due date:  Health Maintenance Due   Topic Date Due   ??? Shingles Vaccine (1 of 2) 11/01/1989   ??? Annual Well Visit  07/26/2017

## 2017-10-11 NOTE — Progress Notes (Signed)
Message sent about labs

## 2017-10-11 NOTE — Progress Notes (Signed)
This is the Subsequent Medicare Annual Wellness Exam, performed 12 months or more after the Initial AWV or the last Subsequent AWV    I have reviewed the patient's medical history in detail and updated the computerized patient record.     History     Past Medical History:   Diagnosis Date   ??? Anemia    ??? Back pain 09/01/2007   ??? Cancer (HCC)     R breast   ??? Chronic pain     BACK PAIN   ??? Coagulation disorder ([place])     IRON DEFFICIENCY ANEMIA   ??? Colonic polyps 08/31/1998   ??? 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 DVT (deep venous thrombosis) ([place]) 01/2017   ??? Hiatal hernia 07/11/2011    Dr.sobieski   ??? Hypercholesteremia 09/01/2007   ??? Hypothyroidism 09/01/2003   ??? 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 Nausea & vomiting    ??? OA (osteoarthritis) 09/01/2007   ??? Other ill-defined conditions(799.89)     VERTIGO   ??? Plantar fasciitis 09/01/2007   ??? S/P colonoscopy 10/10/2007   ??? Thromboembolus ([place]) 01/2017    RIGHT LEG   ??? Thyroid disease       Past Surgical History:   Procedure Laterality Date   ??? COLONOSCOPY N/A 11/12/2016    COLONOSCOPY performed by Burnett Harry, MD at [facility]   ??? COLONOSCOPY,DIAGNOSTIC  11/12/2016        ??? ENDOSCOPY, COLON, DIAGNOSTIC  7/12/18/08    7/05 Dr Maudry Diego   ??? HX BREAST LUMPECTOMY Right 2016   ??? HX BREAST REDUCTION  1993   ??? HX COLONOSCOPY  2014    Dr Illene Silver   ??? HX GI      COLONOSCOPY   ??? HX HEENT  04/07/10    thyroid biopsy neg dr Leward Quan   ??? HX HEENT      wisdom teeth extraction   ??? HX HYSTERECTOMY  1975    PARTIAL   ??? HX KNEE REPLACEMENT Left 08/22/09   ??? HX KNEE REPLACEMENT Right 08/13/12   ??? HX ORTHOPAEDIC Right 11/26/2016    cleaned infection in joint   ??? HX ORTHOPAEDIC  03/2017    right knee   ??? HX OTHER SURGICAL  2013    MINIMALLY INVASIVE LUMBAR DECOMPRESSION   ??? NEUROLOGICAL PROCEDURE UNLISTED  2013    LUMBAR DECOMPRESSION   ??? UPPER GI ENDOSCOPY,BIOPSY  11/12/2016           Current Outpatient Medications   Medication Sig Dispense Refill   ??? cholecalciferol, vitamin D3, (VITAMIN D3 PO) Take  by mouth.     ??? LOW-DOSE ASPIRIN PO Take  by mouth.     ??? timolol (TIMOPTIC) 0.5 % ophthalmic solution Administer 1 Drop to both eyes two (2) times a day.  5   ??? colesevelam (WELCHOL) 625 mg tablet 3 po qd 270 Tab 2   ??? levothyroxine (SYNTHROID) 125 mcg tablet Take 1 Tab by mouth Daily (before breakfast). 90 Tab 1   ??? acetaminophen (TYLENOL EXTRA STRENGTH) 500 mg tablet Take 500 mg by mouth every six (6) hours as needed for Pain.     ??? polyethylene glycol (MIRALAX) 17 gram packet Take 17 g by mouth as needed.     ??? docusate sodium (COLACE) 100 mg capsule Take 100 mg by mouth daily.  Allergies   Allergen Reactions   ??? Atorvastatin Myalgia   ??? Zocor [Simvastatin] Other (comments) and Diarrhea     Other reaction(s): Adverse reaction to substance   Myalgias     ??? Celebrex [Celecoxib] Other (comments)     Aggitation   ??? Gabapentin Other (comments)     FELT UNSTEADY ON MY FEET   ??? Levaquin [Levofloxacin] Palpitations   ??? Nexium [Esomeprazole Magnesium] Diarrhea   ??? Other Medication Swelling     Latanoprost drops   ??? Oxycontin [Oxycodone] Itching   ??? Pravastatin Nausea and Vomiting   ??? Statins-Hmg-Coa Reductase Inhibitors Other (comments)     lymphacytic colitis     ??? Yellow Dye Hives     Family History   Problem Relation Age of Onset   ??? Cancer Mother         Breast   ??? Hypertension Mother    ??? Heart Failure Father         MI   ??? Heart Attack Father    ??? Hypertension Father    ??? MS Brother    ??? Anesth Problems Neg Hx      Social History     Tobacco Use   ??? Smoking status: Never Smoker   ??? Smokeless tobacco: Never Used   Substance Use Topics   ??? Alcohol use: No     Patient Active Problem List   Diagnosis Code   ??? Hypercholesteremia E78.00   ??? OA (osteoarthritis) M19.90   ??? Back pain M54.9   ??? Plantar fasciitis M72.2   ??? Colonic polyps    ??? S/P colonoscopy Z98.890    ??? 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 Iron deficiency anemia D50.9   ??? 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 Hypothyroidism E03.9   ??? Malignant neoplasm of right female breast ([place]) C50.911   ??? Advanced care planning/counseling discussion Z71.89   ??? 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    Functional Ability and Level of Safety:   Hearing Loss  Hearing is good.    Activities of Daily Living  The home contains: no safety equipment.  Patient does total self care    Fall Risk  Fall Risk Assessment, last 12 mths 01/16/2017   Able to walk? Yes   Fall in past 12 months? No   Fall with injury? -   Number of falls in past 12 months -   Fall Risk Score -       Abuse Screen  Patient is not abused    Cognitive Screening   Evaluation of Cognitive Function:  Has your family/caregiver stated any concerns about your memory: no  Normal    Patient Care Team   Patient Care Team:  [name], MD as PCP - General  Marlon Pel Shirley Friar, MD (Cardiology)  Joyce Gross, MD as Physician (Hematology and Oncology)  Deal,  Brien Few, MD as Surgeon (General Surgery)  Lyndel Safe, MD as Physician (Radiation Oncology)  Angelina Pih, NP as Nurse Practitioner (Oncology)  Blair Hailey, MD (Infectious Diseases)    Assessment/Plan   Education and counseling provided:  Are appropriate based on today's review and evaluation  Pneumococcal Vaccine  Influenza Vaccine  Screening Mammography  Screening Pap and pelvic (covered once every 2 years)   Colorectal cancer screening tests  Bone mass measurement (DEXA)  Screening for glaucoma    Diagnoses and all orders for this visit:    1. Medicare annual wellness visit, subsequent    2. Hypercholesterolemia-levels are still up so inc from 2 to 3 tablets daily  -     colesevelam (WELCHOL) 625 mg tablet; 3 po qd  -     METABOLIC PANEL, COMPREHENSIVE    3. Hypothyroidism due to acquired atrophy of thyroid  -     TSH 3RD GENERATION    4. Iron deficiency anemia due to chronic blood loss  -     CBC WITH AUTOMATED DIFF    5. GYN exam for high-risk Medicare patient  -     PAP (IMAGE GUIDED), LIQUID-BASED        Health Maintenance Due   Topic Date Due   ??? Shingrix Vaccine Age 78> (1 of 2) 11/01/1989   ??? MEDICARE YEARLY EXAM  07/26/2017

## 2017-10-12 LAB — METABOLIC PANEL, COMPREHENSIVE
A-G Ratio: 1.7 (ref 1.2–2.2)
ALT (SGPT): 15 IU/L (ref 0–32)
AST (SGOT): 17 IU/L (ref 0–40)
Albumin: 4.2 g/dL (ref 3.5–4.8)
Alk. phosphatase: 71 IU/L (ref 39–117)
BUN/Creatinine ratio: 19 (ref 12–28)
BUN: 16 mg/dL (ref 8–27)
Bilirubin, total: 0.3 mg/dL (ref 0.0–1.2)
CO2: 25 mmol/L (ref 20–29)
Calcium: 9.2 mg/dL (ref 8.7–10.3)
Chloride: 105 mmol/L (ref 96–106)
Creatinine: 0.83 mg/dL (ref 0.57–1.00)
GFR est AA: 79 mL/min/{1.73_m2} (ref >59)
GFR est non-AA: 68 mL/min/{1.73_m2} (ref >59)
GLOBULIN, TOTAL: 2.5 g/dL (ref 1.5–4.5)
Glucose: 94 mg/dL (ref 65–99)
Potassium: 4.5 mmol/L (ref 3.5–5.2)
Protein, total: 6.7 g/dL (ref 6.0–8.5)
Sodium: 143 mmol/L (ref 134–144)

## 2017-10-12 LAB — CBC WITH AUTOMATED DIFF
ABS. BASOPHILS: 0 10*3/uL (ref 0.0–0.2)
ABS. EOSINOPHILS: 0.2 10*3/uL (ref 0.0–0.4)
ABS. IMM. GRANS.: 0 10*3/uL (ref 0.0–0.1)
ABS. MONOCYTES: 0.3 10*3/uL (ref 0.1–0.9)
ABS. NEUTROPHILS: 3.4 10*3/uL (ref 1.4–7.0)
Abs Lymphocytes: 1.2 10*3/uL (ref 0.7–3.1)
BASOPHILS: 0 %
EOSINOPHILS: 4 %
HCT: 33.7 % — ABNORMAL LOW (ref 34.0–46.6)
HGB: 11.5 g/dL (ref 11.1–15.9)
IMMATURE GRANULOCYTES: 0 %
Lymphocytes: 23 %
MCH: 29.6 pg (ref 26.6–33.0)
MCHC: 34.1 g/dL (ref 31.5–35.7)
MCV: 87 fL (ref 79–97)
MONOCYTES: 6 %
NEUTROPHILS: 67 %
PLATELET: 199 10*3/uL (ref 150–379)
RBC: 3.89 x10E6/uL (ref 3.77–5.28)
RDW: 13.3 % (ref 12.3–15.4)
WBC: 5.1 10*3/uL (ref 3.4–10.8)

## 2017-10-12 LAB — TSH 3RD GENERATION: TSH: 0.625 u[IU]/mL (ref 0.450–4.500)

## 2017-10-14 MED ORDER — LEVOTHYROXINE 125 MCG TAB
125 mcg | ORAL_TABLET | Freq: Every day | ORAL | 1 refills | Status: DC
Start: 2017-10-14 — End: 2018-03-24

## 2017-10-15 ENCOUNTER — Ambulatory Visit: Admit: 2017-10-15 | Discharge: 2017-10-15 | Payer: MEDICARE | Attending: Specialist | Primary: Internal Medicine

## 2017-10-15 DIAGNOSIS — Z853 Personal history of malignant neoplasm of breast: Secondary | ICD-10-CM

## 2017-10-15 NOTE — Progress Notes (Signed)
[facility]  [address], [place]   [address]  W: [phone]   F: [phone]      F/u HEME/ONC CONSULT    Reason for visit: evaluation for treatment for breast cancer    Consulting physician: Dr. Gilford Rile    HPI:   Jillian Bean is a 77 y.o.  female who I was asked to see in consultation at the request of Dr. Elie Confer for evaluation for systemic therapy for breast cancer.    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 Right lumpectomy on 11/10/14 shows IDC, 1.7 cm, gr 3, 0/5 LN, no LVI.  PT1cN0Mx.    Sacred Heart q 3 weeks x 6: 12/21/14- 04/12/15  C#5 held 1 week on 03/15/15 due to thrombocytopenia   Outback Herceptin: 05/03/15-11/29/15    S/p XRT 06/03/15    Bone Marrow Bx 10/09/16: Variably normocellular marrow with trilineage hematopoiesis.    Interval History: In today for follow up. Complains of gr 1 constipation, gr 1 swelling.    She has been receiving abx for a hardware infection in her right knee for 8 weeks.     06/20/16 hgb 11.5  07/25/16 hgb 8.7  01/07/17 Hgb 7.1  01/09/17 Hgb 9.3   01/15/17 Hgb 9.0     DX   Encounter Diagnoses   Name Primary?   ??? History of breast cancer Yes   ??? Anemia, normocytic normochromic    ??? Osteoporosis without current pathological fracture, unspecified osteoporosis type    ??? Acute bilateral low back pain with sciatica, sciatica laterality unspecified      Past Medical History:   Diagnosis Date   ??? Anemia    ??? Back pain 09/01/2007   ??? Cancer (HCC)     R breast   ??? Chronic pain     BACK PAIN   ??? Coagulation disorder ([place])     IRON DEFFICIENCY ANEMIA   ??? Colonic polyps 08/31/1998   ??? 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 DVT (deep venous thrombosis) ([place]) 01/2017   ??? Hiatal hernia 07/11/2011    Dr.sobieski   ??? Hypercholesteremia 09/01/2007    ??? Hypothyroidism 09/01/2003   ??? 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 Nausea & vomiting    ??? OA (osteoarthritis) 09/01/2007   ??? Other ill-defined conditions(799.89)     VERTIGO   ??? Plantar fasciitis 09/01/2007   ??? S/P colonoscopy 10/10/2007   ??? Thromboembolus ([place]) 01/2017    RIGHT LEG   ??? Thyroid disease      Past Surgical History:   Procedure Laterality Date   ??? COLONOSCOPY N/A 11/12/2016    COLONOSCOPY performed by Burnett Harry, MD at [facility]   ??? COLONOSCOPY,DIAGNOSTIC  11/12/2016        ??? ENDOSCOPY, COLON, DIAGNOSTIC  7/12/18/08    7/05 Dr Maudry Diego   ??? HX BREAST LUMPECTOMY Right 2016   ??? HX BREAST REDUCTION  1993   ??? HX COLONOSCOPY  2014    Dr Illene Silver   ??? HX GI      COLONOSCOPY   ??? HX HEENT  04/07/10    thyroid biopsy neg dr Leward Quan   ??? HX HEENT  wisdom teeth extraction   ??? HX HYSTERECTOMY  1975    PARTIAL   ??? HX KNEE REPLACEMENT Left 08/22/09   ??? HX KNEE REPLACEMENT Right 08/13/12   ??? HX ORTHOPAEDIC Right 11/26/2016    cleaned infection in joint   ??? HX ORTHOPAEDIC  03/2017    right knee   ??? HX OTHER SURGICAL  2013    MINIMALLY INVASIVE LUMBAR DECOMPRESSION   ??? NEUROLOGICAL PROCEDURE UNLISTED  2013    LUMBAR DECOMPRESSION   ??? UPPER GI ENDOSCOPY,BIOPSY  11/12/2016          Social History     Socioeconomic History   ??? Marital status: MARRIED     Spouse name: Not on file   ??? Number of children: Not on file   ??? Years of education: Not on file   ??? Highest education level: Not on file   Tobacco Use   ??? Smoking status: Never Smoker   ??? Smokeless tobacco: Never Used   Substance and Sexual Activity   ??? Alcohol use: No   ??? Drug use: No   ??? Sexual activity: Not Currently     Family History   Problem Relation Age of Onset   ??? Cancer Mother         Breast   ??? Hypertension Mother    ??? Heart Failure Father         MI   ??? Heart Attack Father    ??? Hypertension Father    ??? MS Brother    ??? Anesth Problems Neg Hx        Current Outpatient Medications   Medication Sig Dispense Refill    ??? levothyroxine (SYNTHROID) 125 mcg tablet Take 1 Tab by mouth Daily (before breakfast). 90 Tab 1   ??? cholecalciferol, vitamin D3, (VITAMIN D3 PO) Take 1,000 Units by mouth daily.     ??? LOW-DOSE ASPIRIN PO Take 81 mg by mouth daily.     ??? timolol (TIMOPTIC) 0.5 % ophthalmic solution Administer 1 Drop to both eyes two (2) times a day.  5   ??? colesevelam (WELCHOL) 625 mg tablet 3 po qd 270 Tab 2   ??? docusate sodium (COLACE) 100 mg capsule Take 100 mg by mouth daily.     ??? acetaminophen (TYLENOL EXTRA STRENGTH) 500 mg tablet Take 500 mg by mouth every six (6) hours as needed for Pain.     ??? polyethylene glycol (MIRALAX) 17 gram packet Take 17 g by mouth as needed.       Allergies   Allergen Reactions   ??? Atorvastatin Myalgia   ??? Zocor [Simvastatin] Other (comments) and Diarrhea     Other reaction(s): Adverse reaction to substance   Myalgias     ??? Celebrex [Celecoxib] Other (comments)     Aggitation   ??? Gabapentin Other (comments)     FELT UNSTEADY ON MY FEET   ??? Levaquin [Levofloxacin] Palpitations   ??? Nexium [Esomeprazole Magnesium] Diarrhea   ??? Other Medication Swelling     Latanoprost drops   ??? Oxycontin [Oxycodone] Itching   ??? Pravastatin Nausea and Vomiting   ??? Statins-Hmg-Coa Reductase Inhibitors Other (comments)     lymphacytic colitis     ??? Yellow Dye Hives       Review of Systems    A comprehensive review of systems was performed and all systems were negative except for HPI and for the symptom report form, reviewed and scanned in.    Objective:  Physical Exam:  Visit Vitals  BP 138/54   Pulse 72   Temp 97.7 ??F (36.5 ??C) (Temporal)   Resp 18   Ht '5\' 5"'  (1.651 m)   Wt 141 lb 9.6 oz (64.2 kg)   LMP 04/13/2010   SpO2 99%   BMI 23.56 kg/m??       General:  Alert, cooperative, no distress, appears stated age.   Head:  Normocephalic, without obvious abnormality, atraumatic.   Eyes:  Conjunctivae/corneas clear. PERRL, EOMs intact.   Throat: Lips, mucosa, and tongue normal.     Neck: Supple, symmetrical, trachea midline, no adenopathy, thyroid: no enlargement/tenderness/nodules   Back:   Symmetric, no curvature. ROM normal. No CVA tenderness.   Lungs:   Clear to auscultation bilaterally.   Chest wall:  No tenderness or deformity.   Heart:  Regular rate and rhythm, S1, S2 normal, no murmur, click, rub or gallop.   Breast Exam:  S/p R lumpectomy.    Abdomen:   Soft, non-tender. Bowel sounds normal. No masses,  No organomegaly.   Extremities: No edema.          Diagnostic Imaging     12/16/14 TTE: EF 71%.  03/02/15 TTE: EF 68%  05/30/15 TTE :  EF 65%  07/21/15 TTE: EF 66%  10/06/15 TTE: EF 62%  05/23/16 TTE EF 60%    03/15/16 dexa  Findings:  ????  Fractures identified on Lateral scanogram:  None  ????  Femoral Neck:  Right  Bone mineral density (gm/cm2):? 0.649  % of peak bone mass: 63  % for age matched controls:? 86  T-score: -2.8  Z-score: -0.8  ????  Total Hip: Right  Bone mineral density (gm/cm2):  0.761  % of peak bone mass:   76  % for age matched controls:  98  T-score:   -2.0  Z-score:  -0.1  ??  Lumbar Spine:  L1-L4  Bone mineral density (gm/cm2):  1.211  % of peak bone mass:  101   % for age matched controls:  124  T-score:  0.1  Z-score:  2.0  ??  The T score for the left distal third radius is -1.4.  ????  IMPRESSION  Impression:  ????  This patient is osteoporotic using the World Health Organization criteria  As compared to the prior study, there has been no change in the bone mineral  density of the right total hip and an increase in the bone mineral density of  the lumbar spine of 2.0%.  10 year probability of major osteoporotic fracture:  19.6%  10 year probability of hip fracture:  7.4%    09/20/16 CT c/a/p:  IMPRESSION:  No acute process in the chest, abdomen, and pelvis.  ??    Lab Results  Lab Results   Component Value Date/Time    WBC 5.1 10/11/2017 12:12 PM    HGB 11.5 10/11/2017 12:12 PM    HCT 33.7 (L) 10/11/2017 12:12 PM    PLATELET 199 10/11/2017 12:12 PM    MCV 87 10/11/2017 12:12 PM      Lab Results   Component Value Date/Time    Sodium 143 10/11/2017 12:12 PM    Potassium 4.5 10/11/2017 12:12 PM    Chloride 105 10/11/2017 12:12 PM    CO2 25 10/11/2017 12:12 PM    Anion gap 12 03/19/2017 03:55 AM    Glucose 94 10/11/2017 12:12 PM    BUN 16 10/11/2017 12:12 PM    Creatinine 0.83 10/11/2017 12:12 PM  BUN/Creatinine ratio 19 10/11/2017 12:12 PM    GFR est AA 79 10/11/2017 12:12 PM    GFR est non-AA 68 10/11/2017 12:12 PM    Calcium 9.2 10/11/2017 12:12 PM    AST (SGOT) 17 10/11/2017 12:12 PM    Alk. phosphatase 71 10/11/2017 12:12 PM    Protein, total 6.7 10/11/2017 12:12 PM    Albumin 4.2 10/11/2017 12:12 PM    Globulin 3.6 11/28/2016 05:25 AM    A-G Ratio 1.7 10/11/2017 12:12 PM    ALT (SGPT) 15 10/11/2017 12:12 PM       Lab Results   Component Value Date/Time    Reticulocyte count 1.5 09/05/2016 09:40 AM    Iron % saturation 18 07/22/2017 10:57 AM    TIBC 198 (L) 07/22/2017 10:57 AM    Ferritin 1,447 (H) 07/22/2017 10:57 AM    Vitamin B12 542 07/30/2016 09:31 AM    Folate 14.8 07/30/2016 09:31 AM    Haptoglobin 412 (H) 09/05/2016 09:40 AM    LD 192 09/05/2016 09:40 AM    Sed rate, automated 67 (H) 11/28/2016 05:25 AM    C-Reactive protein 21.70 (H) 11/28/2016 05:25 AM    TSH 0.625 10/11/2017 12:12 PM     Lab Results   Component Value Date/Time    INR 1.0 02/15/2017 03:11 PM    aPTT 28.7 08/18/2009 01:05 PM       Assessment/Plan:  77 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  PS 0    1. Right Breast cancer stage: IA    Hormonal therapy: not indicated due to receptor (-) status    We explained to the patient that the goal of systemic adjuvant therapy is to improve the chances for cure and decrease the risk of relapse. We explained why a patient can have microscopic cancer spread now even though physical examination, laboratory studies and imaging studies are negative  for cancer. We explained that the same treatments used now as adjuvant or preventive treatments rarely if ever are curative in women who develop metastases.     No evidence of recurrence    Mammogram in March 2018 negative at [facility], due in March 2019, scheduled for 11/09/17.      She sees Dr. Elie Confer next month.     She declined to take neratinib.    2. Emotional well being: She has excellent support and is coping well with her disease.    3. FH of breast cancer: BRCA 1/2 [place] testing negative    4. Osteoporosis: On dexa from 2015; is now on fosamax from Dr. Wanda Plump, started 08/2016, but stopped prior to surgery in 11/2016. DEXA 03/15/16 showed some improvement.  Being managed by Dr. Wanda Plump, seeing her in 03/2018.    5. Back pain low/radiculopathy: Dr. Dema Severin, pain specialist, was following her. NM bone scan and xr show degeneration. Steroid injection in of Octobert; L-spine MRI 01/19/16 shows DDD.  States last steroid injection 09/24/16.     6. Anemia: resolved; Bone Marrow bx 10/02/16: normal. Referred to GI -- Dr. Alita Chyle, on 11/12/16 EGD showed atrophic gastritis and hiatal hernia and colonoscopy showed int hemorrhoids and diverticulosis.      Repeat CBC with Dr. Wanda Plump on 10/11/17 showed normal Hgb at 11.5.    7. R leg DVT: provoked, in non weight bearing leg s/p surgery. Currently on Eliquis 5 mg bid started on 01/22/17. Advised her to stop baby aspirin.  Switched to Lovenox around time of surgery. Will plan to continue anticoagulation for at least 3 months, or while her leg is immobile. Dr. Wanda Plump would like her to continue Eliqius until December.   Reports she stopped Eliquis 08/2017.     Thank you for this consult.  All of the patient's questions were answered today.    Follow-up Disposition:  Return in about 7 months (around 05/17/2018) for 7 mo fu, Alverna Fawley.    Sonda Rumble, MD

## 2017-10-15 NOTE — Progress Notes (Signed)
Jillian Bean is a 77 y.o. female here for follow-up of breast cancer.

## 2017-10-16 NOTE — Telephone Encounter (Signed)
From: Duane Lope  To: [name], MD  Sent: 10/16/2017 9:00 AM EST  Subject: Update Medical Information    10/14/17 received first dose of Shingle vaccine (Shingrix Vial Kit 1 ml) at [facility].

## 2017-10-17 NOTE — Telephone Encounter (Signed)
From: Duane Lope  To: [name], MD  Sent: 10/17/2017 10:37 AM EST  Subject: Update Medical Information    Reviewing results of CX-Vag Cytology details - the Menstrual History shows Hysterectomy - Total.   I had a partial hysterectomy (still have my ovaries). Thx.

## 2017-10-21 ENCOUNTER — Telehealth

## 2017-10-21 MED ORDER — COLESEVELAM 625 MG TAB
625 mg | ORAL_TABLET | ORAL | 0 refills | Status: DC
Start: 2017-10-21 — End: 2018-07-15

## 2017-10-21 NOTE — Telephone Encounter (Addendum)
-----   Message from Lawerance Sabal sent at 10/21/2017  3:39 PM EDT -----  Regarding: Dr.Glynn/Telephone   Pt requesting a 10 day supply for Rx"Colesevelam hydrochloride 625 mg" to be called into [facility] at [phone] due to mail order refill is not going to be delivered in time.Best [phone]      [id number] - [place], [place] [address] AT [place] [address] [phone]

## 2017-10-21 NOTE — Telephone Encounter (Signed)
10 day supply of welchol refilled to pharmacy on file.

## 2017-12-23 NOTE — Telephone Encounter (Signed)
From: Duane Lope  To: [name], MD  Sent: 12/23/2017 11:05 AM EDT  Subject: Update Medical Information    Please be advised that I have receivewd my second Shingles shot on Dec 18, 2017 at [facility]

## 2018-01-01 NOTE — Telephone Encounter (Signed)
Patient called back. Surgery is 01/21/18 not 02/20/18. Thanks.

## 2018-01-01 NOTE — Telephone Encounter (Signed)
Called pt, left v/m for pt asking pt to call back and rsch her appt for cataract pre-op exam. Pt is schedule on 02/20/18 and 01/02/18 is too early to have a pre-op exam.

## 2018-01-02 ENCOUNTER — Ambulatory Visit: Admit: 2018-01-02 | Payer: MEDICARE | Attending: Nurse Practitioner | Primary: Internal Medicine

## 2018-01-02 ENCOUNTER — Ambulatory Visit: Attending: Nurse Practitioner | Primary: Internal Medicine

## 2018-01-02 DIAGNOSIS — H259 Unspecified age-related cataract: Secondary | ICD-10-CM

## 2018-01-02 NOTE — Progress Notes (Signed)
HISTORY OF PRESENT ILLNESS  Jillian Bean is a 77 y.o. female.  HPI  Jillian Bean was referred for evaluation by: Dr. Ernesta Amble for Pre- Op Evaluation.  Please see encounter details and orders for consultative summary.    Type of surgery : Cataract extraction with lens implant  Surgery site : Right eye  Anesthesia type: Regional anesthesia  Date of procedure: 01/21/2018    Allergies:   Allergies   Allergen Reactions   ??? Atorvastatin Myalgia   ??? Zocor [Simvastatin] Other (comments) and Diarrhea     Other reaction(s): Adverse reaction to substance   Myalgias     ??? Celebrex [Celecoxib] Other (comments)     Aggitation   ??? Gabapentin Other (comments)     FELT UNSTEADY ON MY FEET   ??? Levaquin [Levofloxacin] Palpitations   ??? Nexium [Esomeprazole Magnesium] Diarrhea   ??? Other Medication Swelling     Latanoprost drops   ??? Oxycontin [Oxycodone] Itching   ??? Pravastatin Nausea and Vomiting   ??? Statins-Hmg-Coa Reductase Inhibitors Other (comments)     lymphacytic colitis     ??? Yellow Dye Hives     Latex allergy: no  Prior reactions to anesthesia:  None    Functional status:  she is able to ambulate up 2 flights of step without shortness of breath, chest pain  Prior cardiac evaluation: None    SUBJECTIVE: Jillian Bean is a 77 y.o. female here for follow up of hypothyroidism.    Lab Results   Component Value Date/Time    TSH 0.625 10/11/2017 12:12 PM     Thyroid ROS: denies fatigue, weight changes, heat/cold intolerance, bowel/skin changes or CVS symptoms.     History of DVT after right knee surgery in 2018; has been off Eliquis since January 2019 and doing well.    Current Outpatient Medications   Medication Sig   ??? multivitamin (ONE A DAY) tablet Take 1 Tab by mouth daily.   ??? colesevelam (WELCHOL) 625 mg tablet 3 po qd   ??? levothyroxine (SYNTHROID) 125 mcg tablet Take 1 Tab by mouth Daily (before breakfast).   ??? cholecalciferol, vitamin D3, (VITAMIN D3 PO) Take 1,000 Units by mouth daily.   ??? LOW-DOSE ASPIRIN PO Take 81  mg by mouth daily. Taking 2 tab daily   ??? timolol (TIMOPTIC) 0.5 % ophthalmic solution Administer 1 Drop to both eyes two (2) times a day.   ??? acetaminophen (TYLENOL EXTRA STRENGTH) 500 mg tablet Take 500 mg by mouth every six (6) hours as needed for Pain.   ??? docusate sodium (COLACE) 100 mg capsule Take 100 mg by mouth daily.   ??? polyethylene glycol (MIRALAX) 17 gram packet Take 17 g by mouth as needed.     No current facility-administered medications for this visit.      Past Medical History:   Diagnosis Date   ??? Anemia    ??? Back pain 09/01/2007   ??? Cancer (HCC)     R breast   ??? Chronic pain     BACK PAIN   ??? Coagulation disorder ([place])     IRON DEFFICIENCY ANEMIA   ??? Colonic polyps 08/31/1998   ??? 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 DVT (deep venous thrombosis) ([place]) 01/2017   ??? Hiatal hernia 07/11/2011    Dr.sobieski   ??? Hypercholesteremia 09/01/2007   ??? Hypothyroidism 09/01/2003   ??? 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 Nausea & vomiting    ??? OA (osteoarthritis) 09/01/2007   ??? Other ill-defined conditions(799.89)     VERTIGO   ??? Plantar fasciitis 09/01/2007   ??? S/P colonoscopy 10/10/2007   ??? Thromboembolus ([place]) 01/2017    RIGHT LEG   ??? Thyroid disease      Past Surgical History:   Procedure Laterality Date   ??? COLONOSCOPY N/A 11/12/2016    COLONOSCOPY performed by Burnett Harry, MD at [place]   ??? COLONOSCOPY,DIAGNOSTIC  11/12/2016        ??? ENDOSCOPY, COLON, DIAGNOSTIC  7/12/18/08    7/05 Dr Maudry Diego   ??? HX BREAST LUMPECTOMY Right 2016   ??? HX BREAST REDUCTION  1993   ??? HX COLONOSCOPY  2014    Dr Illene Silver   ??? HX GI      COLONOSCOPY   ??? HX HEENT  04/07/10    thyroid biopsy neg dr Leward Quan   ??? HX HEENT      wisdom teeth extraction   ??? HX HYSTERECTOMY  1975    PARTIAL   ??? HX KNEE REPLACEMENT Left 08/22/09   ??? HX KNEE REPLACEMENT Right 08/13/12   ??? HX ORTHOPAEDIC Right 11/26/2016    cleaned infection in joint   ??? HX  ORTHOPAEDIC Right 03/2017    revision total knee replacement   ??? HX OTHER SURGICAL  2013    MINIMALLY INVASIVE LUMBAR DECOMPRESSION   ??? NEUROLOGICAL PROCEDURE UNLISTED  2013    LUMBAR DECOMPRESSION   ??? UPPER GI ENDOSCOPY,BIOPSY  11/12/2016          Social History     Tobacco Use   ??? Smoking status: Never Smoker   ??? Smokeless tobacco: Never Used   Substance Use Topics   ??? Alcohol use: No   ??? Drug use: No       Blood pressure 113/68, pulse 67, temperature 97.8 ??F (36.6 ??C), temperature source Oral, resp. rate 16, height 5\' 5"  (1.651 m), weight 143 lb (64.9 kg), last menstrual period 04/13/2010, SpO2 96 %.    Review of Systems   Constitutional: Negative for chills, fever and malaise/fatigue.   HENT: Negative for congestion and sore throat.    Eyes: Positive for blurred vision.   Respiratory: Negative for cough.    Cardiovascular: Negative for chest pain, palpitations and leg swelling.   Gastrointestinal: Negative for abdominal pain, nausea and vomiting.   Genitourinary: Negative for dysuria and frequency.   Musculoskeletal: Negative for myalgias.   Skin: Negative for rash.   Neurological: Negative for dizziness and headaches.       Physical Exam   Constitutional: She is oriented to person, place, and time. She appears well-developed and well-nourished.   HENT:   Head: Normocephalic and atraumatic.   Right Ear: External ear normal.   Left Ear: External ear normal.   Nose: Nose normal.   Mouth/Throat: Oropharynx is clear and moist.   Eyes: Pupils are equal, round, and reactive to light. Conjunctivae are normal.   Neck: Normal range of motion. Neck supple. No thyromegaly present.   Cardiovascular: Normal rate and regular rhythm.   Pulmonary/Chest: Effort normal and breath sounds normal. She has no wheezes.   Abdominal: Soft. Bowel sounds are normal. There is no tenderness. There is no rebound.   Musculoskeletal: Normal range of motion.   Lymphadenopathy:     She has no cervical adenopathy.   Neurological: She is alert and  oriented to person,  place, and time.   Skin: Skin is warm and dry. No rash noted.   Psychiatric: She has a normal mood and affect. Her behavior is normal.   Nursing note and vitals reviewed.      ASSESSMENT and PLAN  Diagnoses and all orders for this visit:    1. Senile cataract of right eye, unspecified age-related cataract type    2. Preop general physical exam --considered medically acceptable risk for upcoming Right eye cataract extraction with implantation of intraocular lens under regional anesthesia.    3. Glaucoma, unspecified glaucoma type, unspecified laterality    4. Hypothyroidism due to acquired atrophy of thyroid -- stable    5. Hypercholesterolemia    6. History of DVT of lower extremity      lab results and schedule of future lab studies reviewed with patient  reviewed diet, exercise and weight control  reviewed medications and side effects in detail

## 2018-01-02 NOTE — Progress Notes (Signed)
1. Have you been to the ER, urgent care clinic since your last visit?  Hospitalized since your last visit?No    2. Have you seen or consulted any other health care providers outside of the [facility] since your last visit?  Include any pap smears or colon screening. No

## 2018-01-02 NOTE — Progress Notes (Signed)
HISTORY OF PRESENT ILLNESS  Jillian Bean is a 77 y.o. female.  HPI  Jillian Bean was referred for evaluation by: Dr. Ernesta Amble for Pre- Op Evaluation.  Please see encounter details and orders for consultative summary.    Type of surgery : Cataract extraction with lens implant  Surgery site : Right eye  Anesthesia type: Regional anesthesia  Date of procedure: 01/21/2018    Allergies:   Allergies   Allergen Reactions   ??? Atorvastatin Myalgia   ??? Zocor [Simvastatin] Other (comments) and Diarrhea     Other reaction(s): Adverse reaction to substance   Myalgias     ??? Celebrex [Celecoxib] Other (comments)     Aggitation   ??? Gabapentin Other (comments)     FELT UNSTEADY ON MY FEET   ??? Levaquin [Levofloxacin] Palpitations   ??? Nexium [Esomeprazole Magnesium] Diarrhea   ??? Other Medication Swelling     Latanoprost drops   ??? Oxycontin [Oxycodone] Itching   ??? Pravastatin Nausea and Vomiting   ??? Statins-Hmg-Coa Reductase Inhibitors Other (comments)     lymphacytic colitis     ??? Yellow Dye Hives     Latex allergy: no  Prior reactions to anesthesia:  None    Functional status:  she is able to ambulate up 2 flights of step without shortness of breath, chest pain  Prior cardiac evaluation: None    SUBJECTIVE: Jillian Bean is a 77 y.o. female here for follow up of hypothyroidism.    Lab Results   Component Value Date/Time    TSH 0.625 10/11/2017 12:12 PM     Thyroid ROS: denies fatigue, weight changes, heat/cold intolerance, bowel/skin changes or CVS symptoms.     History of DVT after right knee surgery in 2018; has been off Eliquis since January 2019 and doing well.    Current Outpatient Medications   Medication Sig   ??? multivitamin (ONE A DAY) tablet Take 1 Tab by mouth daily.   ??? colesevelam (WELCHOL) 625 mg tablet 3 po qd   ??? levothyroxine (SYNTHROID) 125 mcg tablet Take 1 Tab by mouth Daily (before breakfast).   ??? cholecalciferol, vitamin D3, (VITAMIN D3 PO) Take 1,000 Units by mouth daily.    ??? LOW-DOSE ASPIRIN PO Take 81 mg by mouth daily. Taking 2 tab daily   ??? timolol (TIMOPTIC) 0.5 % ophthalmic solution Administer 1 Drop to both eyes two (2) times a day.   ??? acetaminophen (TYLENOL EXTRA STRENGTH) 500 mg tablet Take 500 mg by mouth every six (6) hours as needed for Pain.   ??? docusate sodium (COLACE) 100 mg capsule Take 100 mg by mouth daily.   ??? polyethylene glycol (MIRALAX) 17 gram packet Take 17 g by mouth as needed.     No current facility-administered medications for this visit.      Past Medical History:   Diagnosis Date   ??? Anemia    ??? Back pain 09/01/2007   ??? Cancer (HCC)     R breast   ??? Chronic pain     BACK PAIN   ??? Coagulation disorder ([place])     IRON DEFFICIENCY ANEMIA   ??? Colonic polyps 08/31/1998   ??? 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 DVT (deep venous thrombosis) ([place]) 01/2017   ??? Hiatal hernia 07/11/2011    Dr.sobieski   ??? Hypercholesteremia 09/01/2007   ??? Hypothyroidism 09/01/2003   ??? 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 Nausea & vomiting    ??? OA (osteoarthritis) 09/01/2007   ??? Other ill-defined conditions(799.89)     VERTIGO   ??? Plantar fasciitis 09/01/2007   ??? S/P colonoscopy 10/10/2007   ??? Thromboembolus ([place]) 01/2017    RIGHT LEG   ??? Thyroid disease      Past Surgical History:   Procedure Laterality Date   ??? COLONOSCOPY N/A 11/12/2016    COLONOSCOPY performed by Burnett Harry, MD at [place]   ??? COLONOSCOPY,DIAGNOSTIC  11/12/2016        ??? ENDOSCOPY, COLON, DIAGNOSTIC  7/12/18/08    7/05 Dr Maudry Diego   ??? HX BREAST LUMPECTOMY Right 2016   ??? HX BREAST REDUCTION  1993   ??? HX COLONOSCOPY  2014    Dr Illene Silver   ??? HX GI      COLONOSCOPY   ??? HX HEENT  04/07/10    thyroid biopsy neg dr Leward Quan   ??? HX HEENT      wisdom teeth extraction   ??? HX HYSTERECTOMY  1975    PARTIAL   ??? HX KNEE REPLACEMENT Left 08/22/09   ??? HX KNEE REPLACEMENT Right 08/13/12   ??? HX ORTHOPAEDIC Right 11/26/2016     cleaned infection in joint   ??? HX ORTHOPAEDIC Right 03/2017    revision total knee replacement   ??? HX OTHER SURGICAL  2013    MINIMALLY INVASIVE LUMBAR DECOMPRESSION   ??? NEUROLOGICAL PROCEDURE UNLISTED  2013    LUMBAR DECOMPRESSION   ??? UPPER GI ENDOSCOPY,BIOPSY  11/12/2016          Social History     Tobacco Use   ??? Smoking status: Never Smoker   ??? Smokeless tobacco: Never Used   Substance Use Topics   ??? Alcohol use: No   ??? Drug use: No       Blood pressure 113/68, pulse 67, temperature 97.8 ??F (36.6 ??C), temperature source Oral, resp. rate 16, height 5\' 5"  (1.651 m), weight 143 lb (64.9 kg), last menstrual period 04/13/2010, SpO2 96 %.    Review of Systems   Constitutional: Negative for chills, fever and malaise/fatigue.   HENT: Negative for congestion and sore throat.    Eyes: Positive for blurred vision.   Respiratory: Negative for cough.    Cardiovascular: Negative for chest pain, palpitations and leg swelling.   Gastrointestinal: Negative for abdominal pain, nausea and vomiting.   Genitourinary: Negative for dysuria and frequency.   Musculoskeletal: Negative for myalgias.   Skin: Negative for rash.   Neurological: Negative for dizziness and headaches.       Physical Exam   Constitutional: She is oriented to person, place, and time. She appears well-developed and well-nourished.   HENT:   Head: Normocephalic and atraumatic.   Right Ear: External ear normal.   Left Ear: External ear normal.   Nose: Nose normal.   Mouth/Throat: Oropharynx is clear and moist.   Eyes: Pupils are equal, round, and reactive to light. Conjunctivae are normal.   Neck: Normal range of motion. Neck supple. No thyromegaly present.   Cardiovascular: Normal rate and regular rhythm.   Pulmonary/Chest: Effort normal and breath sounds normal. She has no wheezes.   Abdominal: Soft. Bowel sounds are normal. There is no tenderness. There is no rebound.   Musculoskeletal: Normal range of motion.   Lymphadenopathy:      She has no cervical adenopathy.   Neurological: She is alert and oriented to person,  place, and time.   Skin: Skin is warm and dry. No rash noted.   Psychiatric: She has a normal mood and affect. Her behavior is normal.   Nursing note and vitals reviewed.      ASSESSMENT and PLAN  Diagnoses and all orders for this visit:    1. Senile cataract of right eye, unspecified age-related cataract type    2. Preop general physical exam --considered medically acceptable risk for upcoming Right eye cataract extraction with implantation of intraocular lens under regional anesthesia.    3. Glaucoma, unspecified glaucoma type, unspecified laterality    4. Hypothyroidism due to acquired atrophy of thyroid -- stable    5. Hypercholesterolemia    6. History of DVT of lower extremity      lab results and schedule of future lab studies reviewed with patient  reviewed diet, exercise and weight control  reviewed medications and side effects in detail

## 2018-01-02 NOTE — Patient Instructions (Addendum)
Cataract Surgery: Before Your Surgery  What is cataract surgery?    Cataracts are cloudy areas in the lens of your eye. Your lens is behind the colored part of your eye (iris). Its job is to focus light onto the back of your eye. In some people, cataracts prevent light from reaching the back of the eye. This can cause vision problems.  Cataract surgery helps you see better. It replaces your natural lens, which has become cloudy, with a clear artificial one.  There are two types of cataract surgery.  Phacoemulsification (say "fack-oh-ee-mul-suh-fuh-KAY-shun") is the most common type. The doctor makes a small cut in your eye. This cut is called an incision. The doctor uses a special ultrasound tool to break your cloudy lens apart. Sometimes a laser is used too. Then he or she removes the small pieces of the lens through the incision. In most cases, the doctor then inserts an artificial lens through the incision. Most people do not need stitches, because the incision is so small. If the doctor is not able to put in an artificial lens, you can wear a contact lens or thick glasses in place of your natural lens.  Extracapsular extraction is a less common type of cataract surgery. The doctor makes a larger incision to remove the whole lens at once. After the doctor removes the lens, he or she stitches up the incision. Recovery from this type of surgery takes longer.  Before either surgery, the doctor puts numbing drops in your eye. Some doctors use a shot instead. You may also get medicine to make you feel relaxed. You probably will not feel much pain. The surgery takes about 20 to 40 minutes. After surgery, you may have a bandage or shield on your eye.  You will probably go home from surgery after 1 hour in the recovery room. Most people see better in 1 to 3 days. You may be able to go back to work or your normal routine in a few days. It could take 3 to 10 weeks for your  eye to completely heal. After your eye heals, you may still need to wear glasses, especially for reading.  Follow-up care is a key part of your treatment and safety. Be sure to make and go to all appointments, and call your doctor if you are having problems. It's also a good idea to know your test results and keep a list of the medicines you take.  What happens before surgery?  ??Surgery can be stressful. This information will help you understand what you can expect. And it will help you safely prepare for surgery.  ??Preparing for surgery  ?? ?? Understand exactly what surgery is planned, along with the risks, benefits, and other options.    ?? Tell your doctors ALL the medicines, vitamins, supplements, and herbal remedies you take. Some of these can increase the risk of bleeding or interact with anesthesia.   ?? ?? If you take blood thinners, such as warfarin (Coumadin), clopidogrel (Plavix), or aspirin, be sure to talk to your doctor. He or she will tell you if you should stop taking these medicines before your surgery. Make sure that you understand exactly what your doctor wants you to do.   ?? ?? Your doctor will tell you which medicines to take or stop before your surgery. You may need to stop taking certain medicines a week or more before surgery. So talk to your doctor as soon as you can.   ?? ?? If   you have an advance directive, let your doctor know. It may include a living will and a durable power of attorney for health care. Bring a copy to the hospital. If you don't have one, you may want to prepare one. It lets your doctor and loved ones know your health care wishes. Doctors advise that everyone prepare these papers before any type of surgery or procedure.   What happens on the day of surgery?   ?? Follow the instructions exactly about when to stop eating and drinking. If you don't, your surgery may be canceled. If your doctor told you to take your medicines on the day of surgery, take them with only a sip of  water.   ?? ?? Take a bath or shower before you come in for your surgery. Do not apply lotions, perfumes, deodorants, or nail polish.   ?? ?? Take off all jewelry and piercings. And take out contact lenses, if you wear them.   ??At the hospital or surgery center   ?? Bring a picture ID.   ?? ?? The area for surgery is often marked to make sure there are no errors.   ?? ?? You will be kept comfortable and safe by your anesthesia provider. The anesthesia may make you sleep. Or it may just numb the area being worked on.   ?? ?? The surgery will take about 20 to 40 minutes.   Going home   ?? Be sure you have someone to drive you home. Anesthesia and pain medicine make it unsafe for you to drive.   ?? ?? You will be given more specific instructions about recovering from your surgery. They will cover things like diet, wound care, follow-up care, driving, and getting back to your normal routine.   ?? ?? You may have a bandage or patch over your eye. You may also have a clear shield over your eye. This prevents you from rubbing it.   When should you call your doctor?   ?? You have questions or concerns.   ?? ?? You don't understand how to prepare for your surgery.   ?? ?? You become ill before the surgery (such as fever, flu, or a cold).   ?? ?? You need to reschedule or have changed your mind about having the surgery.   Where can you learn more?  Go to http://www.healthwise.net/[employer].  Enter K474 in the search box to learn more about "Cataract Surgery: Before Your Surgery."  Current as of: February 26, 2017  Content Version: 11.9  ?? 2006-2018 [employer]. Care instructions adapted under license by [employer] (which disclaims liability or warranty for this information). If you have questions about a medical condition or this instruction, always ask your healthcare professional. [employer] disclaims any warranty or liability for your use of this information.

## 2018-01-02 NOTE — Progress Notes (Signed)
1. Have you been to the ER, urgent care clinic since your last visit?  Hospitalized since your last visit?No    2. Have you seen or consulted any other health care providers outside of the [facility] since your last visit?  Include any pap smears or colon screening. No

## 2018-02-24 ENCOUNTER — Ambulatory Visit: Admit: 2018-02-24 | Discharge: 2018-02-24 | Payer: MEDICARE | Attending: Nurse Practitioner | Primary: Internal Medicine

## 2018-02-24 ENCOUNTER — Ambulatory Visit: Attending: Nurse Practitioner | Primary: Internal Medicine

## 2018-02-24 DIAGNOSIS — H259 Unspecified age-related cataract: Secondary | ICD-10-CM

## 2018-02-24 NOTE — Progress Notes (Signed)
HISTORY OF PRESENT ILLNESS  Jillian Bean is a 77 y.o. female.  HPI  Jillian Bean was referred for evaluation by:Dr. Ernesta Amble for Pre- Op Evaluation.  Please see encounter details and orders for consultative summary.    Type of surgery : Cataract extraction with lens implant  Surgery site : Left eye  Anesthesia type: Regional  Date of procedure:  03/11/2018    Allergies:   Allergies   Allergen Reactions   ??? Atorvastatin Myalgia   ??? Zocor [Simvastatin] Other (comments) and Diarrhea     Other reaction(s): Adverse reaction to substance   Myalgias     ??? Celebrex [Celecoxib] Other (comments)     Aggitation   ??? Gabapentin Other (comments)     FELT UNSTEADY ON MY FEET   ??? Levaquin [Levofloxacin] Palpitations   ??? Nexium [Esomeprazole Magnesium] Diarrhea   ??? Other Medication Swelling     Latanoprost drops   ??? Oxycontin [Oxycodone] Itching   ??? Pravastatin Nausea and Vomiting   ??? Statins-Hmg-Coa Reductase Inhibitors Other (comments)     lymphacytic colitis     ??? Yellow Dye Hives     Latex allergy: no  Prior reactions to anesthesia:  None    Functional status:  she is able to ambulate up 2 flights of step without shortness of breath, chest pain  Prior cardiac evaluation:   none    Current Outpatient Medications   Medication Sig   ??? multivitamin (ONE A DAY) tablet Take 1 Tab by mouth daily.   ??? colesevelam (WELCHOL) 625 mg tablet 3 po qd   ??? levothyroxine (SYNTHROID) 125 mcg tablet Take 1 Tab by mouth Daily (before breakfast).   ??? cholecalciferol, vitamin D3, (VITAMIN D3 PO) Take 1,000 Units by mouth daily.   ??? LOW-DOSE ASPIRIN PO Take 81 mg by mouth daily. Taking 2 tab daily   ??? timolol (TIMOPTIC) 0.5 % ophthalmic solution Administer 1 Drop to both eyes two (2) times a day.   ??? docusate sodium (COLACE) 100 mg capsule Take 100 mg by mouth daily.   ??? acetaminophen (TYLENOL EXTRA STRENGTH) 500 mg tablet Take 500 mg by mouth every six (6) hours as needed for Pain.   ??? polyethylene glycol (MIRALAX) 17 gram packet Take 17 g by  mouth as needed.     No current facility-administered medications for this visit.      Past Medical History:   Diagnosis Date   ??? Anemia    ??? Back pain 09/01/2007   ??? Cancer (HCC)     R breast   ??? Chronic pain     BACK PAIN   ??? Coagulation disorder ([place])     IRON DEFFICIENCY ANEMIA   ??? Colonic polyps 08/31/1998   ??? 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 DVT (deep venous thrombosis) ([place]) 01/2017   ??? Hiatal hernia 07/11/2011    Dr.sobieski   ??? Hypercholesteremia 09/01/2007   ??? Hypothyroidism 09/01/2003   ??? 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 Nausea & vomiting    ??? OA (osteoarthritis) 09/01/2007   ??? Other ill-defined conditions(799.89)     VERTIGO   ??? Plantar fasciitis 09/01/2007   ??? S/P colonoscopy 10/10/2007   ??? Thromboembolus ([place]) 01/2017    RIGHT LEG   ??? Thyroid disease      Past Surgical History:   Procedure Laterality Date   ??? COLONOSCOPY  N/A 11/12/2016    COLONOSCOPY performed by Burnett Harry, MD at [facility]   ??? COLONOSCOPY,DIAGNOSTIC  11/12/2016        ??? ENDOSCOPY, COLON, DIAGNOSTIC  7/12/18/08    7/05 Dr Maudry Diego   ??? HX BREAST LUMPECTOMY Right 2016   ??? HX BREAST REDUCTION  1993   ??? HX CATARACT REMOVAL Right    ??? HX COLONOSCOPY  2014    Dr Illene Silver   ??? HX GI      COLONOSCOPY   ??? HX HEENT  04/07/10    thyroid biopsy neg dr Leward Quan   ??? HX HEENT      wisdom teeth extraction   ??? HX HYSTERECTOMY  1975    PARTIAL   ??? HX KNEE REPLACEMENT Left 08/22/09   ??? HX KNEE REPLACEMENT Right 08/13/12   ??? HX ORTHOPAEDIC Right 11/26/2016    cleaned infection in joint   ??? HX ORTHOPAEDIC Right 03/2017    revision total knee replacement   ??? HX OTHER SURGICAL  2013    MINIMALLY INVASIVE LUMBAR DECOMPRESSION   ??? NEUROLOGICAL PROCEDURE UNLISTED  2013    LUMBAR DECOMPRESSION   ??? UPPER GI ENDOSCOPY,BIOPSY  11/12/2016          Social History     Tobacco Use   ??? Smoking status: Never Smoker   ??? Smokeless tobacco: Never Used   Substance Use Topics    ??? Alcohol use: No   ??? Drug use: No       Blood pressure 121/52, pulse 68, temperature 97.9 ??F (36.6 ??C), temperature source Oral, resp. rate 12, height 5\' 5"  (1.651 m), weight 146 lb (66.2 kg), last menstrual period 04/13/2010, SpO2 96 %.    Review of Systems   Constitutional: Negative for chills, fever and malaise/fatigue.   HENT: Negative for congestion and sore throat.    Eyes: Positive for blurred vision.   Respiratory: Negative for cough and shortness of breath.    Cardiovascular: Negative for chest pain, palpitations and leg swelling.   Gastrointestinal: Negative for nausea and vomiting.   Genitourinary: Negative for dysuria and frequency.   Musculoskeletal: Positive for joint pain (bilateral knees). Negative for myalgias.   Neurological: Negative for dizziness, tingling and headaches.   Psychiatric/Behavioral: Negative for depression. The patient is not nervous/anxious.        Physical Exam   Constitutional: She is oriented to person, place, and time. She appears well-developed and well-nourished.   HENT:   Head: Normocephalic and atraumatic.   Right Ear: External ear normal.   Left Ear: External ear normal.   Nose: Nose normal.   Mouth/Throat: Oropharynx is clear and moist.   Eyes: Conjunctivae are normal.   Neck: Normal range of motion. Neck supple. No thyromegaly present.   Cardiovascular: Normal rate, regular rhythm and normal heart sounds.   Pulmonary/Chest: Effort normal and breath sounds normal. She has no wheezes.   Abdominal: Soft. Bowel sounds are normal. There is no tenderness. There is no rebound.   Musculoskeletal: Normal range of motion.   Lymphadenopathy:     She has no cervical adenopathy.   Neurological: She is alert and oriented to person, place, and time.   Skin: Skin is warm and dry. No rash noted.   Psychiatric: She has a normal mood and affect. Her behavior is normal.   Nursing note and vitals reviewed.      ASSESSMENT and PLAN  Diagnoses and all orders for this visit:    1. Senile  cataract of left eye, unspecified age-related cataract type    2. Preop general physical exam -- considered medically stable for upcoming Left cataract extraction surgery with regional anesthesia    3. Hypothyroidism due to acquired atrophy of thyroid    4. Hypercholesterolemia      lab results and schedule of future lab studies reviewed with patient  reviewed diet, exercise and weight control  reviewed medications and side effects in detail

## 2018-02-24 NOTE — Patient Instructions (Addendum)
Cataract Surgery: Before Your Surgery  What is cataract surgery?    Cataracts are cloudy areas in the lens of your eye. Your lens is behind the colored part of your eye (iris). Its job is to focus light onto the back of your eye. In some people, cataracts prevent light from reaching the back of the eye. This can cause vision problems.  Cataract surgery helps you see better. It replaces your natural lens, which has become cloudy, with a clear artificial one.  There are two types of cataract surgery.  Phacoemulsification (say "fack-oh-ee-mul-suh-fuh-KAY-shun") is the most common type. The doctor makes a small cut in your eye. This cut is called an incision. The doctor uses a special ultrasound tool to break your cloudy lens apart. Sometimes a laser is used too. Then he or she removes the small pieces of the lens through the incision. In most cases, the doctor then inserts an artificial lens through the incision. Most people do not need stitches, because the incision is so small. If the doctor is not able to put in an artificial lens, you can wear a contact lens or thick glasses in place of your natural lens.  Extracapsular extraction is a less common type of cataract surgery. The doctor makes a larger incision to remove the whole lens at once. After the doctor removes the lens, he or she stitches up the incision. Recovery from this type of surgery takes longer.  Before either surgery, the doctor puts numbing drops in your eye. Some doctors use a shot instead. You may also get medicine to make you feel relaxed. You probably will not feel much pain. The surgery takes about 20 to 40 minutes. After surgery, you may have a bandage or shield on your eye.  You will probably go home from surgery after 1 hour in the recovery room. Most people see better in 1 to 3 days. You may be able to go back to work or your normal routine in a few days. It could take 3 to 10 weeks for your  eye to completely heal. After your eye heals, you may still need to wear glasses, especially for reading.  Follow-up care is a key part of your treatment and safety. Be sure to make and go to all appointments, and call your doctor if you are having problems. It's also a good idea to know your test results and keep a list of the medicines you take.  What happens before surgery?  ??Surgery can be stressful. This information will help you understand what you can expect. And it will help you safely prepare for surgery.  ??Preparing for surgery  ?? ?? Understand exactly what surgery is planned, along with the risks, benefits, and other options.    ?? Tell your doctors ALL the medicines, vitamins, supplements, and herbal remedies you take. Some of these can increase the risk of bleeding or interact with anesthesia.   ?? ?? If you take blood thinners, such as warfarin (Coumadin), clopidogrel (Plavix), or aspirin, be sure to talk to your doctor. He or she will tell you if you should stop taking these medicines before your surgery. Make sure that you understand exactly what your doctor wants you to do.   ?? ?? Your doctor will tell you which medicines to take or stop before your surgery. You may need to stop taking certain medicines a week or more before surgery. So talk to your doctor as soon as you can.   ?? ?? If   you have an advance directive, let your doctor know. It may include a living will and a durable power of attorney for health care. Bring a copy to the hospital. If you don't have one, you may want to prepare one. It lets your doctor and loved ones know your health care wishes. Doctors advise that everyone prepare these papers before any type of surgery or procedure.   What happens on the day of surgery?   ?? Follow the instructions exactly about when to stop eating and drinking. If you don't, your surgery may be canceled. If your doctor told you to take your medicines on the day of surgery, take them with only a sip of  water.   ?? ?? Take a bath or shower before you come in for your surgery. Do not apply lotions, perfumes, deodorants, or nail polish.   ?? ?? Take off all jewelry and piercings. And take out contact lenses, if you wear them.   ??At the hospital or surgery center   ?? Bring a picture ID.   ?? ?? The area for surgery is often marked to make sure there are no errors.   ?? ?? You will be kept comfortable and safe by your anesthesia provider. The anesthesia may make you sleep. Or it may just numb the area being worked on.   ?? ?? The surgery will take about 20 to 40 minutes.   Going home   ?? Be sure you have someone to drive you home. Anesthesia and pain medicine make it unsafe for you to drive.   ?? ?? You will be given more specific instructions about recovering from your surgery. They will cover things like diet, wound care, follow-up care, driving, and getting back to your normal routine.   ?? ?? You may have a bandage or patch over your eye. You may also have a clear shield over your eye. This prevents you from rubbing it.   When should you call your doctor?   ?? You have questions or concerns.   ?? ?? You don't understand how to prepare for your surgery.   ?? ?? You become ill before the surgery (such as fever, flu, or a cold).   ?? ?? You need to reschedule or have changed your mind about having the surgery.   Where can you learn more?  Go to http://www.healthwise.net/[employer].  Enter K474 in the search box to learn more about "Cataract Surgery: Before Your Surgery."  Current as of: February 26, 2017  Content Version: 11.9  ?? 2006-2018 [employer]. Care instructions adapted under license by [employer] (which disclaims liability or warranty for this information). If you have questions about a medical condition or this instruction, always ask your healthcare professional. [employer] disclaims any warranty or liability for your use of this information.

## 2018-02-24 NOTE — Progress Notes (Signed)
HISTORY OF PRESENT ILLNESS  Jillian Bean is a 77 y.o. female.  HPI  Jillian Bean was referred for evaluation by:Dr. Ernesta Amble for Pre- Op Evaluation.  Please see encounter details and orders for consultative summary.    Type of surgery : Cataract extraction with lens implant  Surgery site : Left eye  Anesthesia type: Regional  Date of procedure:  03/11/2018    Allergies:   Allergies   Allergen Reactions   ??? Atorvastatin Myalgia   ??? Zocor [Simvastatin] Other (comments) and Diarrhea     Other reaction(s): Adverse reaction to substance   Myalgias     ??? Celebrex [Celecoxib] Other (comments)     Aggitation   ??? Gabapentin Other (comments)     FELT UNSTEADY ON MY FEET   ??? Levaquin [Levofloxacin] Palpitations   ??? Nexium [Esomeprazole Magnesium] Diarrhea   ??? Other Medication Swelling     Latanoprost drops   ??? Oxycontin [Oxycodone] Itching   ??? Pravastatin Nausea and Vomiting   ??? Statins-Hmg-Coa Reductase Inhibitors Other (comments)     lymphacytic colitis     ??? Yellow Dye Hives     Latex allergy: no  Prior reactions to anesthesia:  None    Functional status:  she is able to ambulate up 2 flights of step without shortness of breath, chest pain  Prior cardiac evaluation:   none    Current Outpatient Medications   Medication Sig   ??? multivitamin (ONE A DAY) tablet Take 1 Tab by mouth daily.   ??? colesevelam (WELCHOL) 625 mg tablet 3 po qd   ??? levothyroxine (SYNTHROID) 125 mcg tablet Take 1 Tab by mouth Daily (before breakfast).   ??? cholecalciferol, vitamin D3, (VITAMIN D3 PO) Take 1,000 Units by mouth daily.   ??? LOW-DOSE ASPIRIN PO Take 81 mg by mouth daily. Taking 2 tab daily   ??? timolol (TIMOPTIC) 0.5 % ophthalmic solution Administer 1 Drop to both eyes two (2) times a day.   ??? docusate sodium (COLACE) 100 mg capsule Take 100 mg by mouth daily.   ??? acetaminophen (TYLENOL EXTRA STRENGTH) 500 mg tablet Take 500 mg by mouth every six (6) hours as needed for Pain.    ??? polyethylene glycol (MIRALAX) 17 gram packet Take 17 g by mouth as needed.     No current facility-administered medications for this visit.      Past Medical History:   Diagnosis Date   ??? Anemia    ??? Back pain 09/01/2007   ??? Cancer (HCC)     R breast   ??? Chronic pain     BACK PAIN   ??? Coagulation disorder ([place])     IRON DEFFICIENCY ANEMIA   ??? Colonic polyps 08/31/1998   ??? 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 DVT (deep venous thrombosis) ([place]) 01/2017   ??? Hiatal hernia 07/11/2011    Dr.sobieski   ??? Hypercholesteremia 09/01/2007   ??? Hypothyroidism 09/01/2003   ??? 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 Nausea & vomiting    ??? OA (osteoarthritis) 09/01/2007   ??? Other ill-defined conditions(799.89)     VERTIGO   ??? Plantar fasciitis 09/01/2007   ??? S/P colonoscopy 10/10/2007   ??? Thromboembolus ([place]) 01/2017    RIGHT LEG   ??? Thyroid disease      Past Surgical History:   Procedure Laterality Date   ??? COLONOSCOPY  N/A 11/12/2016    COLONOSCOPY performed by Burnett Harry, MD at [facility]   ??? COLONOSCOPY,DIAGNOSTIC  11/12/2016        ??? ENDOSCOPY, COLON, DIAGNOSTIC  7/12/18/08    7/05 Dr Maudry Diego   ??? HX BREAST LUMPECTOMY Right 2016   ??? HX BREAST REDUCTION  1993   ??? HX CATARACT REMOVAL Right    ??? HX COLONOSCOPY  2014    Dr Illene Silver   ??? HX GI      COLONOSCOPY   ??? HX HEENT  04/07/10    thyroid biopsy neg dr Leward Quan   ??? HX HEENT      wisdom teeth extraction   ??? HX HYSTERECTOMY  1975    PARTIAL   ??? HX KNEE REPLACEMENT Left 08/22/09   ??? HX KNEE REPLACEMENT Right 08/13/12   ??? HX ORTHOPAEDIC Right 11/26/2016    cleaned infection in joint   ??? HX ORTHOPAEDIC Right 03/2017    revision total knee replacement   ??? HX OTHER SURGICAL  2013    MINIMALLY INVASIVE LUMBAR DECOMPRESSION   ??? NEUROLOGICAL PROCEDURE UNLISTED  2013    LUMBAR DECOMPRESSION   ??? UPPER GI ENDOSCOPY,BIOPSY  11/12/2016          Social History     Tobacco Use    ??? Smoking status: Never Smoker   ??? Smokeless tobacco: Never Used   Substance Use Topics   ??? Alcohol use: No   ??? Drug use: No       Blood pressure 121/52, pulse 68, temperature 97.9 ??F (36.6 ??C), temperature source Oral, resp. rate 12, height 5\' 5"  (1.651 m), weight 146 lb (66.2 kg), last menstrual period 04/13/2010, SpO2 96 %.    Review of Systems   Constitutional: Negative for chills, fever and malaise/fatigue.   HENT: Negative for congestion and sore throat.    Eyes: Positive for blurred vision.   Respiratory: Negative for cough and shortness of breath.    Cardiovascular: Negative for chest pain, palpitations and leg swelling.   Gastrointestinal: Negative for nausea and vomiting.   Genitourinary: Negative for dysuria and frequency.   Musculoskeletal: Positive for joint pain (bilateral knees). Negative for myalgias.   Neurological: Negative for dizziness, tingling and headaches.   Psychiatric/Behavioral: Negative for depression. The patient is not nervous/anxious.        Physical Exam   Constitutional: She is oriented to person, place, and time. She appears well-developed and well-nourished.   HENT:   Head: Normocephalic and atraumatic.   Right Ear: External ear normal.   Left Ear: External ear normal.   Nose: Nose normal.   Mouth/Throat: Oropharynx is clear and moist.   Eyes: Conjunctivae are normal.   Neck: Normal range of motion. Neck supple. No thyromegaly present.   Cardiovascular: Normal rate, regular rhythm and normal heart sounds.   Pulmonary/Chest: Effort normal and breath sounds normal. She has no wheezes.   Abdominal: Soft. Bowel sounds are normal. There is no tenderness. There is no rebound.   Musculoskeletal: Normal range of motion.   Lymphadenopathy:     She has no cervical adenopathy.   Neurological: She is alert and oriented to person, place, and time.   Skin: Skin is warm and dry. No rash noted.   Psychiatric: She has a normal mood and affect. Her behavior is normal.    Nursing note and vitals reviewed.      ASSESSMENT and PLAN  Diagnoses and all orders for this visit:    1.  Senile cataract of left eye, unspecified age-related cataract type    2. Preop general physical exam -- considered medically stable for upcoming Left cataract extraction surgery with regional anesthesia    3. Hypothyroidism due to acquired atrophy of thyroid    4. Hypercholesterolemia      lab results and schedule of future lab studies reviewed with patient  reviewed diet, exercise and weight control  reviewed medications and side effects in detail

## 2018-03-25 MED ORDER — LEVOTHYROXINE 125 MCG TAB
125 mcg | ORAL_TABLET | Freq: Every day | ORAL | 1 refills | Status: DC
Start: 2018-03-25 — End: 2018-04-19

## 2018-03-26 ENCOUNTER — Inpatient Hospital Stay: Admit: 2018-03-26 | Primary: Internal Medicine

## 2018-04-15 NOTE — Telephone Encounter (Signed)
PSR r/s patients appointment with Dr. Wanda Plump and patient request appointment this week. Patient is going out of the country 04/28/18 to [place] and [place] and needs to know if DR. Wanda Plump recommends anything before leaving?     Please call patient to advise. [phone]      Current appointment Monday, May 12, 2018 10:00 AM

## 2018-04-15 NOTE — Telephone Encounter (Signed)
PSR called and notified patient of appointment with Dr. Wanda Plump 04/17/18. Patient very thankful for return call and appointment.

## 2018-04-15 NOTE — Telephone Encounter (Signed)
Please notify she has been rescheduled for Sept 5th at 10:00.

## 2018-04-17 ENCOUNTER — Inpatient Hospital Stay: Admit: 2018-07-16 | Payer: MEDICARE | Primary: Internal Medicine

## 2018-04-17 ENCOUNTER — Ambulatory Visit: Admit: 2018-04-17 | Payer: MEDICARE | Attending: Internal Medicine | Primary: Internal Medicine

## 2018-04-17 ENCOUNTER — Ambulatory Visit: Attending: Internal Medicine | Primary: Internal Medicine

## 2018-04-17 DIAGNOSIS — E034 Atrophy of thyroid (acquired): Secondary | ICD-10-CM

## 2018-04-17 NOTE — Progress Notes (Signed)
Inc synthroid to 125 mcg 7 days with extra 1/2 tab on sunday

## 2018-04-17 NOTE — Progress Notes (Signed)
HISTORY OF PRESENT ILLNESS  Jillian Bean is a 77 y.o. female.  HPI  Jillian Bean is seen for med check, generally feeling well.  Looking forward to a trip to [place] on September 16th.  Issues:  1. Hypothyroidism, on replacement.  Due for levels.  No symptoms of hypo or hyperthyroid.   2. History of DVT, currently off Eliquis and on two 81 mg aspirin daily.  Encouraged compression hose for travel.  3. Osteoporosis.  Due for bone density.  Will check vitamin D levels.  4. Arthritis.  Has seen Dr. Elvin So and now on one Aleve daily.  Discussed potential GI side effects.  Will check creatinine.      Review of Systems   Constitutional: Negative for chills, fever and weight loss.   Respiratory: Negative for cough, shortness of breath and wheezing.    Cardiovascular: Negative for chest pain, palpitations, orthopnea, leg swelling and PND.   Gastrointestinal: Negative for abdominal pain, heartburn, nausea and vomiting.   Musculoskeletal: Positive for joint pain. Negative for falls and myalgias.   Neurological: Negative for dizziness and headaches.   Psychiatric/Behavioral: Negative for depression.       Physical Exam   Constitutional: She is oriented to person, place, and time. She appears well-developed and well-nourished.   HENT:   Head: Normocephalic and atraumatic.   Right Ear: External ear normal.   Left Ear: External ear normal.   Mouth/Throat: Oropharynx is clear and moist.   Neck: Normal range of motion. Neck supple. Carotid bruit is not present. No thyromegaly present.   Cardiovascular: Normal rate, regular rhythm, S1 normal, S2 normal, normal heart sounds and intact distal pulses.   No murmur heard.  Pulmonary/Chest: Effort normal and breath sounds normal. No respiratory distress. She has no wheezes. She has no rales.   Musculoskeletal: She exhibits no edema.   Neurological: She is alert and oriented to person, place, and time.   Psychiatric: She has a normal mood and affect. Her behavior is normal.   Nursing note  and vitals reviewed.      ASSESSMENT and PLAN  Diagnoses and all orders for this visit:    1. Hypothyroidism due to acquired atrophy of thyroid  -     TSH 3RD GENERATION  -     T4, FREE    2. Hypercholesterolemia  -     METABOLIC PANEL, COMPREHENSIVE  -     LIPID PANEL    3. History of DVT of lower extremity-prevention for travel discussed cont asa and compression hose  -     CBC WITH AUTOMATED DIFF    4. Osteoporosis of femur without pathological fracture  -     DEXA BONE DENSITY STUDY AXIAL; Future  -     VITAMIN D, 25 HYDROXY

## 2018-04-17 NOTE — Progress Notes (Signed)
HISTORY OF PRESENT ILLNESS  Jillian Bean is a 77 y.o. female.  HPI  Jillian Bean is seen for med check, generally feeling well.  Looking forward to a trip to [place] on September 16th.  Issues:  1. Hypothyroidism, on replacement.  Due for levels.  No symptoms of hypo or hyperthyroid.   2. History of DVT, currently off Eliquis and on two 81 mg aspirin daily.  Encouraged compression hose for travel.  3. Osteoporosis.  Due for bone density.  Will check vitamin D levels.  4. Arthritis.  Has seen Dr. Elvin So and now on one Aleve daily.  Discussed potential GI side effects.  Will check creatinine.      Review of Systems   Constitutional: Negative for chills, fever and weight loss.   Respiratory: Negative for cough, shortness of breath and wheezing.    Cardiovascular: Negative for chest pain, palpitations, orthopnea, leg swelling and PND.   Gastrointestinal: Negative for abdominal pain, heartburn, nausea and vomiting.   Musculoskeletal: Positive for joint pain. Negative for falls and myalgias.   Neurological: Negative for dizziness and headaches.   Psychiatric/Behavioral: Negative for depression.       Physical Exam   Constitutional: She is oriented to person, place, and time. She appears well-developed and well-nourished.   HENT:   Head: Normocephalic and atraumatic.   Right Ear: External ear normal.   Left Ear: External ear normal.   Mouth/Throat: Oropharynx is clear and moist.   Neck: Normal range of motion. Neck supple. Carotid bruit is not present. No thyromegaly present.   Cardiovascular: Normal rate, regular rhythm, S1 normal, S2 normal, normal heart sounds and intact distal pulses.   No murmur heard.  Pulmonary/Chest: Effort normal and breath sounds normal. No respiratory distress. She has no wheezes. She has no rales.   Musculoskeletal: She exhibits no edema.   Neurological: She is alert and oriented to person, place, and time.   Psychiatric: She has a normal mood and affect. Her behavior is normal.    Nursing note and vitals reviewed.      ASSESSMENT and PLAN  Diagnoses and all orders for this visit:    1. Hypothyroidism due to acquired atrophy of thyroid  -     TSH 3RD GENERATION  -     T4, FREE    2. Hypercholesterolemia  -     METABOLIC PANEL, COMPREHENSIVE  -     LIPID PANEL    3. History of DVT of lower extremity-prevention for travel discussed cont asa and compression hose  -     CBC WITH AUTOMATED DIFF    4. Osteoporosis of femur without pathological fracture  -     DEXA BONE DENSITY STUDY AXIAL; Future  -     VITAMIN D, 25 HYDROXY

## 2018-04-18 LAB — METABOLIC PANEL, COMPREHENSIVE
A-G Ratio: 2.1 (ref 1.2–2.2)
ALT (SGPT): 14 IU/L (ref 0–32)
AST (SGOT): 19 IU/L (ref 0–40)
Albumin: 4.5 g/dL (ref 3.5–4.8)
Alk. phosphatase: 79 IU/L (ref 39–117)
BUN/Creatinine ratio: 25 (ref 12–28)
BUN: 20 mg/dL (ref 8–27)
Bilirubin, total: 0.3 mg/dL (ref 0.0–1.2)
CO2: 25 mmol/L (ref 20–29)
Calcium: 9.2 mg/dL (ref 8.7–10.3)
Chloride: 104 mmol/L (ref 96–106)
Creatinine: 0.79 mg/dL (ref 0.57–1.00)
GFR est AA: 83 mL/min/{1.73_m2} (ref >59)
GFR est non-AA: 72 mL/min/{1.73_m2} (ref >59)
GLOBULIN, TOTAL: 2.1 g/dL (ref 1.5–4.5)
Glucose: 88 mg/dL (ref 65–99)
Potassium: 4.2 mmol/L (ref 3.5–5.2)
Protein, total: 6.6 g/dL (ref 6.0–8.5)
Sodium: 142 mmol/L (ref 134–144)

## 2018-04-18 LAB — LIPID PANEL
Cholesterol, Total: 221 mg/dL — ABNORMAL HIGH (ref 100–199)
Cholesterol, total: 221 mg/dL — ABNORMAL HIGH (ref 100–199)
HDL Cholesterol: 63 mg/dL (ref >39)
HDL: 63 mg/dL (ref >39)
LDL Calculated: 139 mg/dL — ABNORMAL HIGH (ref 0–99)
LDL, calculated: 139 mg/dL — ABNORMAL HIGH (ref 0–99)
Triglyceride: 93 mg/dL (ref 0–149)
Triglycerides: 93 mg/dL (ref 0–149)
VLDL Cholesterol Calculated: 19 mg/dL (ref 5–40)
VLDL, calculated: 19 mg/dL (ref 5–40)

## 2018-04-18 LAB — CBC WITH AUTOMATED DIFF
ABS. BASOPHILS: 0 10*3/uL (ref 0.0–0.2)
ABS. EOSINOPHILS: 0.2 10*3/uL (ref 0.0–0.4)
ABS. IMM. GRANS.: 0 10*3/uL (ref 0.0–0.1)
ABS. MONOCYTES: 0.4 10*3/uL (ref 0.1–0.9)
ABS. NEUTROPHILS: 4 10*3/uL (ref 1.4–7.0)
Abs Lymphocytes: 1.2 10*3/uL (ref 0.7–3.1)
BASOPHILS: 1 %
EOSINOPHILS: 3 %
HCT: 36.1 % (ref 34.0–46.6)
HGB: 12 g/dL (ref 11.1–15.9)
IMMATURE GRANULOCYTES: 0 %
Lymphocytes: 21 %
MCH: 29.1 pg (ref 26.6–33.0)
MCHC: 33.2 g/dL (ref 31.5–35.7)
MCV: 88 fL (ref 79–97)
MONOCYTES: 7 %
NEUTROPHILS: 68 %
PLATELET: 243 10*3/uL (ref 150–450)
RBC: 4.12 x10E6/uL (ref 3.77–5.28)
RDW: 12.1 % — ABNORMAL LOW (ref 12.3–15.4)
WBC: 5.9 10*3/uL (ref 3.4–10.8)

## 2018-04-18 LAB — T4, FREE
T4 Free: 1.6 ng/dL (ref 0.82–1.77)
T4, Free: 1.6 ng/dL (ref 0.82–1.77)

## 2018-04-18 LAB — TSH 3RD GENERATION
TSH: 4.82 u[IU]/mL — ABNORMAL HIGH (ref 0.450–4.500)
TSH: 4.82 u[IU]/mL — ABNORMAL HIGH (ref 0.450–4.500)

## 2018-04-18 LAB — CVD REPORT

## 2018-04-18 LAB — VITAMIN D, 25 HYDROXY: VITAMIN D, 25-HYDROXY: 33 ng/mL (ref 30.0–100.0)

## 2018-04-18 LAB — COMPREHENSIVE METABOLIC PANEL
ALT: 14 IU/L (ref 0–32)
AST: 19 IU/L (ref 0–40)
Albumin/Globulin Ratio: 2.1 NA (ref 1.2–2.2)
Albumin: 4.5 g/dL (ref 3.5–4.8)
Alkaline Phosphatase: 79 IU/L (ref 39–117)
BUN/Creatinine Ratio: 25 NA (ref 12–28)
BUN: 20 mg/dL (ref 8–27)
CO2: 25 mmol/L (ref 20–29)
Calcium: 9.2 mg/dL (ref 8.7–10.3)
Chloride: 104 mmol/L (ref 96–106)
Creatinine: 0.79 mg/dL (ref 0.57–1.00)
GFR African American: 83 mL/min/{1.73_m2} (ref >59)
Globulin, Total: 2.1 g/dL (ref 1.5–4.5)
Glucose: 88 mg/dL (ref 65–99)
Potassium: 4.2 mmol/L (ref 3.5–5.2)
Sodium: 142 mmol/L (ref 134–144)
Total Bilirubin: 0.3 mg/dL (ref 0.0–1.2)
Total Protein: 6.6 g/dL (ref 6.0–8.5)
eGFR NON-AA: 72 mL/min/{1.73_m2} (ref >59)

## 2018-04-18 LAB — CBC WITH AUTO DIFFERENTIAL
Basophils %: 1 %
Basophils Absolute: 0 10*3/uL (ref 0.0–0.2)
Eosinophils %: 3 %
Eosinophils Absolute: 0.2 10*3/uL (ref 0.0–0.4)
Granulocyte Absolute Count: 0 10*3/uL (ref 0.0–0.1)
Hematocrit: 36.1 % (ref 34.0–46.6)
Hemoglobin: 12 g/dL (ref 11.1–15.9)
Immature Granulocytes %: 0 %
Lymphocytes %: 21 %
Lymphocytes Absolute: 1.2 10*3/uL (ref 0.7–3.1)
MCH: 29.1 pg (ref 26.6–33.0)
MCHC: 33.2 g/dL (ref 31.5–35.7)
MCV: 88 fL (ref 79–97)
Monocytes %: 7 %
Monocytes Absolute: 0.4 10*3/uL (ref 0.1–0.9)
Neutrophils %: 68 %
Neutrophils Absolute: 4 10*3/uL (ref 1.4–7.0)
Platelets: 243 10*3/uL (ref 150–450)
RBC: 4.12 x10E6/uL (ref 3.77–5.28)
RDW: 12.1 % — ABNORMAL LOW (ref 12.3–15.4)
WBC: 5.9 10*3/uL (ref 3.4–10.8)

## 2018-04-18 LAB — VITAMIN D 25 HYDROXY: Vit D, 25-Hydroxy: 33 ng/mL (ref 30.0–100.0)

## 2018-04-19 MED ORDER — LEVOTHYROXINE 125 MCG TAB
125 mcg | ORAL_TABLET | ORAL | 3 refills | Status: DC
Start: 2018-04-19 — End: 2019-06-09

## 2018-04-19 NOTE — Telephone Encounter (Signed)
Please let her know that her thyroid level shows slightly under-replaced on current 125 mcg dose so I sent to [facility] for dose of 125 every day with extra 1/2 tab on Sunday   Other labs are good-follow up in 3-4 months

## 2018-04-21 NOTE — Telephone Encounter (Signed)
Patient notified of thyroid results as noted per PCP & synthroid med changes. Advised appt in 3-4 months for a recheck. Patient voiced understanding.

## 2018-04-23 ENCOUNTER — Encounter: Attending: Internal Medicine | Primary: Internal Medicine

## 2018-05-12 ENCOUNTER — Encounter: Attending: Internal Medicine | Primary: Internal Medicine

## 2018-05-15 ENCOUNTER — Inpatient Hospital Stay: Admit: 2018-05-15 | Payer: MEDICARE | Attending: Internal Medicine | Primary: Internal Medicine

## 2018-05-15 DIAGNOSIS — M810 Age-related osteoporosis without current pathological fracture: Secondary | ICD-10-CM

## 2018-05-15 NOTE — Progress Notes (Signed)
message  reclast advised

## 2018-05-18 NOTE — Progress Notes (Signed)
message  reclast advised

## 2018-05-28 ENCOUNTER — Ambulatory Visit: Admit: 2018-05-28 | Discharge: 2018-05-28 | Payer: MEDICARE | Attending: Specialist | Primary: Internal Medicine

## 2018-05-28 ENCOUNTER — Ambulatory Visit: Attending: Specialist | Primary: Internal Medicine

## 2018-05-28 DIAGNOSIS — Z853 Personal history of malignant neoplasm of breast: Secondary | ICD-10-CM

## 2018-05-28 NOTE — Progress Notes (Signed)
[name] is a 77 y.o. female here for follow-up of breast cancer.

## 2018-05-28 NOTE — Progress Notes (Signed)
[facility]  [place] [place]   [address]  W: [phone]   F: [phone]      F/u HEME/ONC CONSULT    Reason for visit: evaluation for treatment for breast cancer    Consulting physician: Dr. Gilford Rile    HPI:   Jillian Bean is a 77 y.o.  female who I was asked to see in consultation at the request of Dr. Elie Confer for evaluation for systemic therapy for breast cancer.    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 Right lumpectomy on 11/10/14 shows IDC, 1.7 cm, gr 3, 0/5 LN, no LVI.  PT1cN0Mx.    [place] q 3 weeks x 6: 12/21/14- 04/12/15  C#5 held 1 week on 03/15/15 due to thrombocytopenia   Outback Herceptin: 05/03/15-11/29/15    S/p XRT 06/03/15    Bone Marrow Bx 10/09/16: Variably normocellular marrow with trilineage hematopoiesis.    Interval History: In today for follow up. Complains of gr 1 constipation       DX   No diagnosis found.  Past Medical History:   Diagnosis Date   ??? Anemia    ??? Back pain 09/01/2007   ??? Cancer (HCC)     R breast   ??? Chronic pain     BACK PAIN   ??? Coagulation disorder ([place])     IRON DEFFICIENCY ANEMIA   ??? Colonic polyps 08/31/1998   ??? 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 DVT (deep venous thrombosis) ([place]) 01/2017   ??? Hiatal hernia 07/11/2011    Dr.sobieski   ??? Hypercholesteremia 09/01/2007   ??? Hypothyroidism 09/01/2003   ??? 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 Nausea & vomiting    ??? OA (osteoarthritis) 09/01/2007   ??? Other ill-defined conditions(799.89)     VERTIGO   ??? Plantar fasciitis 09/01/2007   ??? S/P colonoscopy 10/10/2007   ??? Thromboembolus ([place]) 01/2017    RIGHT LEG   ??? Thyroid disease      Past Surgical History:   Procedure Laterality Date   ??? COLONOSCOPY N/A 11/12/2016    COLONOSCOPY performed by [place] Harry,  MD at [facility]   ??? COLONOSCOPY,DIAGNOSTIC  11/12/2016        ??? ENDOSCOPY, COLON, DIAGNOSTIC  7/12/18/08    7/05 Dr Maudry Diego   ??? HX BREAST LUMPECTOMY Right 2016   ??? HX BREAST REDUCTION  1993   ??? HX CATARACT REMOVAL Right    ??? HX COLONOSCOPY  2014    Dr Illene Silver   ??? HX GI      COLONOSCOPY   ??? HX HEENT  04/07/10    thyroid biopsy neg dr Leward Quan   ??? HX HEENT      wisdom teeth extraction   ??? HX HYSTERECTOMY  1975    PARTIAL   ??? HX KNEE REPLACEMENT Left 08/22/09   ??? HX KNEE REPLACEMENT Right 08/13/12   ??? HX ORTHOPAEDIC Right 11/26/2016    cleaned infection in joint   ??? HX ORTHOPAEDIC Right 03/2017    revision total knee replacement   ??? HX OTHER SURGICAL  2013    MINIMALLY INVASIVE LUMBAR DECOMPRESSION   ??? NEUROLOGICAL PROCEDURE UNLISTED  2013  LUMBAR DECOMPRESSION   ??? UPPER GI ENDOSCOPY,BIOPSY  11/12/2016          Social History     Socioeconomic History   ??? Marital status: MARRIED     Spouse name: Not on file   ??? Number of children: Not on file   ??? Years of education: Not on file   ??? Highest education level: Not on file   Tobacco Use   ??? Smoking status: Never Smoker   ??? Smokeless tobacco: Never Used   Substance and Sexual Activity   ??? Alcohol use: No   ??? Drug use: No   ??? Sexual activity: Not Currently     Family History   Problem Relation Age of Onset   ??? Cancer Mother         Breast   ??? Hypertension Mother    ??? Heart Failure Father         MI   ??? Heart Attack Father    ??? Hypertension Father    ??? MS Brother    ??? Anesth Problems Neg Hx        Current Outpatient Medications   Medication Sig Dispense Refill   ??? levothyroxine (SYNTHROID) 125 mcg tablet 1 po every day and extra 1/2 tab on sunday 100 Tab 3   ??? naproxen sodium (ALEVE PO) Take 1 Tab by mouth daily.     ??? multivitamin (ONE A DAY) tablet Take 1 Tab by mouth daily.     ??? colesevelam (WELCHOL) 625 mg tablet 3 po qd 30 Tab 0   ??? cholecalciferol, vitamin D3, (VITAMIN D3 PO) Take 1,000 Units by mouth daily.     ??? LOW-DOSE ASPIRIN PO Take 81 mg by mouth daily. Taking 2  tab daily     ??? docusate sodium (COLACE) 100 mg capsule Take 100 mg by mouth daily.     ??? acetaminophen (TYLENOL EXTRA STRENGTH) 500 mg tablet Take 500 mg by mouth every six (6) hours as needed for Pain.     ??? polyethylene glycol (MIRALAX) 17 gram packet Take 17 g by mouth as needed.       Allergies   Allergen Reactions   ??? Atorvastatin Myalgia   ??? Zocor [Simvastatin] Other (comments) and Diarrhea     Other reaction(s): Adverse reaction to substance   Myalgias     ??? Celebrex [Celecoxib] Other (comments)     Aggitation   ??? Gabapentin Other (comments)     FELT UNSTEADY ON MY FEET   ??? Levaquin [Levofloxacin] Palpitations   ??? Nexium [Esomeprazole Magnesium] Diarrhea   ??? Other Medication Swelling     Latanoprost drops   ??? Oxycontin [Oxycodone] Itching   ??? Pravastatin Nausea and Vomiting   ??? Statins-Hmg-Coa Reductase Inhibitors Other (comments)     lymphacytic colitis     ??? Yellow Dye Hives       Review of Systems    A comprehensive review of systems was performed and all systems were negative except for HPI and for the symptom report form, reviewed and scanned in.    Objective:  Physical Exam:  Visit Vitals  Ht '5\' 5"'$  (1.651 m)   Wt 145 lb 6.4 oz (66 kg)   LMP 04/13/2010   BMI 24.20 kg/m??       General:  Alert, cooperative, no distress, appears stated age.   Head:  Normocephalic, without obvious abnormality, atraumatic.   Eyes:  Conjunctivae/corneas clear. PERRL, EOMs intact.   Throat: Lips, mucosa, and tongue  normal.    Neck: Supple, symmetrical, trachea midline, no adenopathy, thyroid: no enlargement/tenderness/nodules   Back:   Symmetric, no curvature. ROM normal. No CVA tenderness.   Lungs:   Clear to auscultation bilaterally.   Chest wall:  No tenderness or deformity.   Heart:  Regular rate and rhythm, S1, S2 normal, no murmur, click, rub or gallop.   Breast Exam:  S/p R lumpectomy.    Abdomen:   Soft, non-tender. Bowel sounds normal. No masses,  No organomegaly.   Extremities: No edema.          Diagnostic Imaging      12/16/14 TTE: EF 71%.  03/02/15 TTE: EF 68%  05/30/15 TTE :  EF 65%  07/21/15 TTE: EF 66%  10/06/15 TTE: EF 62%  05/23/16 TTE EF 60%    03/15/16 dexa  Findings:  ????  Fractures identified on Lateral scanogram:  None  ????  Femoral Neck:  Right  Bone mineral density (gm/cm2):? 0.649  % of peak bone mass: 63  % for age matched controls:? 86  T-score: -2.8  Z-score: -0.8  ????  Total Hip: Right  Bone mineral density (gm/cm2):  0.761  % of peak bone mass:   76  % for age matched controls:  98  T-score:   -2.0  Z-score:  -0.1  ??  Lumbar Spine:  L1-L4  Bone mineral density (gm/cm2):  1.211  % of peak bone mass:  101   % for age matched controls:  124  T-score:  0.1  Z-score:  2.0  ??  The T score for the left distal third radius is -1.4.  ????  IMPRESSION  Impression:  ????  This patient is osteoporotic using the World Health Organization criteria  As compared to the prior study, there has been no change in the bone mineral  density of the right total hip and an increase in the bone mineral density of  the lumbar spine of 2.0%.  10 year probability of major osteoporotic fracture:  19.6%  10 year probability of hip fracture:  7.4%    09/20/16 CT c/a/p:  IMPRESSION:  No acute process in the chest, abdomen, and pelvis.  ??    Lab Results  Lab Results   Component Value Date/Time    WBC 5.9 04/17/2018 10:59 AM    HGB 12.0 04/17/2018 10:59 AM    HCT 36.1 04/17/2018 10:59 AM    PLATELET 243 04/17/2018 10:59 AM    MCV 88 04/17/2018 10:59 AM     Lab Results   Component Value Date/Time    Sodium 142 04/17/2018 10:59 AM    Potassium 4.2 04/17/2018 10:59 AM    Chloride 104 04/17/2018 10:59 AM    CO2 25 04/17/2018 10:59 AM    Anion gap 12 03/19/2017 03:55 AM    Glucose 88 04/17/2018 10:59 AM    BUN 20 04/17/2018 10:59 AM    Creatinine 0.79 04/17/2018 10:59 AM    BUN/Creatinine ratio 25 04/17/2018 10:59 AM    GFR est AA 83 04/17/2018 10:59 AM    GFR est non-AA 72 04/17/2018 10:59 AM    Calcium 9.2 04/17/2018 10:59 AM    AST (SGOT) 19 04/17/2018 10:59  AM    Alk. phosphatase 79 04/17/2018 10:59 AM    Protein, total 6.6 04/17/2018 10:59 AM    Albumin 4.5 04/17/2018 10:59 AM    Globulin 3.6 11/28/2016 05:25 AM    A-G Ratio 2.1 04/17/2018 10:59 AM    ALT (SGPT)  14 04/17/2018 10:59 AM       Lab Results   Component Value Date/Time    Reticulocyte count 1.5 09/05/2016 09:40 AM    Iron % saturation 18 07/22/2017 10:57 AM    TIBC 198 (L) 07/22/2017 10:57 AM    Ferritin 1,447 (H) 07/22/2017 10:57 AM    Vitamin B12 542 07/30/2016 09:31 AM    Folate 14.8 07/30/2016 09:31 AM    Haptoglobin 412 (H) 09/05/2016 09:40 AM    LD 192 09/05/2016 09:40 AM    Sed rate, automated 67 (H) 11/28/2016 05:25 AM    C-Reactive protein 21.70 (H) 11/28/2016 05:25 AM    TSH 4.820 (H) 04/17/2018 10:59 AM     Lab Results   Component Value Date/Time    INR 1.0 02/15/2017 03:11 PM    aPTT 28.7 08/18/2009 01:05 PM       Assessment/Plan:  77 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  PS 0    1. Right Breast cancer stage: IA    Hormonal therapy: not indicated due to receptor (-) status    We explained to the patient that the goal of systemic adjuvant therapy is to improve the chances for cure and decrease the risk of relapse. We explained why a patient can have microscopic cancer spread now even though physical examination, laboratory studies and imaging studies are negative for cancer. We explained that the same treatments used now as adjuvant or preventive treatments rarely if ever are curative in women who develop metastases.     No evidence of recurrence    Mammogram in March 2019 negative at [facility] breast center, due in March 2020    She declined to take neratinib.    2. Emotional well being: She has excellent support and is coping well with her disease.    3. FH of breast cancer: BRCA 1/2 Ambry testing negative    4. Osteoporosis: On dexa from 2015; was on fosamax from Dr. Wanda Plump, started 08/2016, but stopped prior to surgery in  11/2016. DEXA 03/15/16 showed some improvement. Dr. Wanda Plump has recommended Reclast    We discussed the data from [place], [place] 2013, for the meta-analysis for adjuvant bisphosphonate use in breast cancer.  We discussed that in postmenopausal women, it appears that the bisphosphate zolendronic acid, given as in ABCSG 12 at 4 mg IV q 6 months x 3 years, is beneficial in improving bone DFS and OS.  We discussed side effects such as ONJ and the need to avoid invasive dental procedures while on these medications, renal damage, and hypocalcemia.    After this discussion, she is agreeable to zometa as above, will set up.    5. Back pain low/radiculopathy: Dr. Dema Severin, pain specialist, was following her. NM bone scan and xr show degeneration. Steroid injection in of Octobert; L-spine MRI 01/19/16 shows DDD.  States last steroid injection 09/24/16.     6. R leg DVT: provoked, in non weight bearing leg s/p surgery. Reports she stopped Eliquis 08/2017.     Thank you for this consult.  All of the patient's questions were answered today.        Sonda Rumble, MD

## 2018-05-28 NOTE — Progress Notes (Signed)
[facility]  [address] [place]   [address]  W: [phone]   F: [phone]      F/u HEME/ONC CONSULT    Reason for visit: evaluation for treatment for breast cancer    Consulting physician: Dr. Gilford Rile    HPI:   Jillian Bean is a 77 y.o.  female who I was asked to see in consultation at the request of Dr. Elie Confer for evaluation for systemic therapy for breast cancer.    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 Right lumpectomy on 11/10/14 shows IDC, 1.7 cm, gr 3, 0/5 LN, no LVI.  PT1cN0Mx.    [place] q 3 weeks x 6: 12/21/14- 04/12/15  C#5 held 1 week on 03/15/15 due to thrombocytopenia   Outback Herceptin: 05/03/15-11/29/15    S/p XRT 06/03/15    Bone Marrow Bx 10/09/16: Variably normocellular marrow with trilineage hematopoiesis.    Interval History: In today for follow up. Complains of gr 1 constipation       DX   No diagnosis found.  Past Medical History:   Diagnosis Date   ??? Anemia    ??? Back pain 09/01/2007   ??? Cancer (HCC)     R breast   ??? Chronic pain     BACK PAIN   ??? Coagulation disorder ([place])     IRON DEFFICIENCY ANEMIA   ??? Colonic polyps 08/31/1998   ??? 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 DVT (deep venous thrombosis) ([place]) 01/2017   ??? Hiatal hernia 07/11/2011    Dr.sobieski   ??? Hypercholesteremia 09/01/2007   ??? Hypothyroidism 09/01/2003   ??? 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 Nausea & vomiting    ??? OA (osteoarthritis) 09/01/2007   ??? Other ill-defined conditions(799.89)     VERTIGO   ??? Plantar fasciitis 09/01/2007   ??? S/P colonoscopy 10/10/2007   ??? Thromboembolus ([place]) 01/2017    RIGHT LEG   ??? Thyroid disease      Past Surgical History:   Procedure Laterality Date   ??? COLONOSCOPY N/A 11/12/2016     COLONOSCOPY performed by [place] Harry, MD at [facility]   ??? COLONOSCOPY,DIAGNOSTIC  11/12/2016        ??? ENDOSCOPY, COLON, DIAGNOSTIC  7/12/18/08    7/05 Dr Maudry Diego   ??? HX BREAST LUMPECTOMY Right 2016   ??? HX BREAST REDUCTION  1993   ??? HX CATARACT REMOVAL Right    ??? HX COLONOSCOPY  2014    Dr Illene Silver   ??? HX GI      COLONOSCOPY   ??? HX HEENT  04/07/10    thyroid biopsy neg dr Leward Quan   ??? HX HEENT      wisdom teeth extraction   ??? HX HYSTERECTOMY  1975    PARTIAL   ??? HX KNEE REPLACEMENT Left 08/22/09   ??? HX KNEE REPLACEMENT Right 08/13/12   ??? HX ORTHOPAEDIC Right 11/26/2016    cleaned infection in joint   ??? HX ORTHOPAEDIC Right 03/2017    revision total knee replacement   ??? HX OTHER SURGICAL  2013    MINIMALLY INVASIVE LUMBAR DECOMPRESSION   ??? NEUROLOGICAL PROCEDURE UNLISTED  2013  LUMBAR DECOMPRESSION   ??? UPPER GI ENDOSCOPY,BIOPSY  11/12/2016          Social History     Socioeconomic History   ??? Marital status: MARRIED     Spouse name: Not on file   ??? Number of children: Not on file   ??? Years of education: Not on file   ??? Highest education level: Not on file   Tobacco Use   ??? Smoking status: Never Smoker   ??? Smokeless tobacco: Never Used   Substance and Sexual Activity   ??? Alcohol use: No   ??? Drug use: No   ??? Sexual activity: Not Currently     Family History   Problem Relation Age of Onset   ??? Cancer Mother         Breast   ??? Hypertension Mother    ??? Heart Failure Father         MI   ??? Heart Attack Father    ??? Hypertension Father    ??? MS Brother    ??? Anesth Problems Neg Hx        Current Outpatient Medications   Medication Sig Dispense Refill   ??? levothyroxine (SYNTHROID) 125 mcg tablet 1 po every day and extra 1/2 tab on sunday 100 Tab 3   ??? naproxen sodium (ALEVE PO) Take 1 Tab by mouth daily.     ??? multivitamin (ONE A DAY) tablet Take 1 Tab by mouth daily.     ??? colesevelam (WELCHOL) 625 mg tablet 3 po qd 30 Tab 0   ??? cholecalciferol, vitamin D3, (VITAMIN D3 PO) Take 1,000 Units by mouth daily.      ??? LOW-DOSE ASPIRIN PO Take 81 mg by mouth daily. Taking 2 tab daily     ??? docusate sodium (COLACE) 100 mg capsule Take 100 mg by mouth daily.     ??? acetaminophen (TYLENOL EXTRA STRENGTH) 500 mg tablet Take 500 mg by mouth every six (6) hours as needed for Pain.     ??? polyethylene glycol (MIRALAX) 17 gram packet Take 17 g by mouth as needed.       Allergies   Allergen Reactions   ??? Atorvastatin Myalgia   ??? Zocor [Simvastatin] Other (comments) and Diarrhea     Other reaction(s): Adverse reaction to substance   Myalgias     ??? Celebrex [Celecoxib] Other (comments)     Aggitation   ??? Gabapentin Other (comments)     FELT UNSTEADY ON MY FEET   ??? Levaquin [Levofloxacin] Palpitations   ??? Nexium [Esomeprazole Magnesium] Diarrhea   ??? Other Medication Swelling     Latanoprost drops   ??? Oxycontin [Oxycodone] Itching   ??? Pravastatin Nausea and Vomiting   ??? Statins-Hmg-Coa Reductase Inhibitors Other (comments)     lymphacytic colitis     ??? Yellow Dye Hives       Review of Systems    A comprehensive review of systems was performed and all systems were negative except for HPI and for the symptom report form, reviewed and scanned in.    Objective:  Physical Exam:  Visit Vitals  Ht '5\' 5"'  (1.651 m)   Wt 145 lb 6.4 oz (66 kg)   LMP 04/13/2010   BMI 24.20 kg/m??       General:  Alert, cooperative, no distress, appears stated age.   Head:  Normocephalic, without obvious abnormality, atraumatic.   Eyes:  Conjunctivae/corneas clear. PERRL, EOMs intact.   Throat: Lips, mucosa, and tongue  normal.    Neck: Supple, symmetrical, trachea midline, no adenopathy, thyroid: no enlargement/tenderness/nodules   Back:   Symmetric, no curvature. ROM normal. No CVA tenderness.   Lungs:   Clear to auscultation bilaterally.   Chest wall:  No tenderness or deformity.   Heart:  Regular rate and rhythm, S1, S2 normal, no murmur, click, rub or gallop.   Breast Exam:  S/p R lumpectomy.    Abdomen:   Soft, non-tender. Bowel sounds normal. No masses,  No  organomegaly.   Extremities: No edema.          Diagnostic Imaging     12/16/14 TTE: EF 71%.  03/02/15 TTE: EF 68%  05/30/15 TTE :  EF 65%  07/21/15 TTE: EF 66%  10/06/15 TTE: EF 62%  05/23/16 TTE EF 60%    03/15/16 dexa  Findings:  ????  Fractures identified on Lateral scanogram:  None  ????  Femoral Neck:  Right  Bone mineral density (gm/cm2):? 0.649  % of peak bone mass: 63  % for age matched controls:? 86  T-score: -2.8  Z-score: -0.8  ????  Total Hip: Right  Bone mineral density (gm/cm2):  0.761  % of peak bone mass:   76  % for age matched controls:  98  T-score:   -2.0  Z-score:  -0.1  ??  Lumbar Spine:  L1-L4  Bone mineral density (gm/cm2):  1.211  % of peak bone mass:  101   % for age matched controls:  124  T-score:  0.1  Z-score:  2.0  ??  The T score for the left distal third radius is -1.4.  ????  IMPRESSION  Impression:  ????  This patient is osteoporotic using the World Health Organization criteria  As compared to the prior study, there has been no change in the bone mineral  density of the right total hip and an increase in the bone mineral density of  the lumbar spine of 2.0%.  10 year probability of major osteoporotic fracture:  19.6%  10 year probability of hip fracture:  7.4%    09/20/16 CT c/a/p:  IMPRESSION:  No acute process in the chest, abdomen, and pelvis.  ??    Lab Results  Lab Results   Component Value Date/Time    WBC 5.9 04/17/2018 10:59 AM    HGB 12.0 04/17/2018 10:59 AM    HCT 36.1 04/17/2018 10:59 AM    PLATELET 243 04/17/2018 10:59 AM    MCV 88 04/17/2018 10:59 AM     Lab Results   Component Value Date/Time    Sodium 142 04/17/2018 10:59 AM    Potassium 4.2 04/17/2018 10:59 AM    Chloride 104 04/17/2018 10:59 AM    CO2 25 04/17/2018 10:59 AM    Anion gap 12 03/19/2017 03:55 AM    Glucose 88 04/17/2018 10:59 AM    BUN 20 04/17/2018 10:59 AM    Creatinine 0.79 04/17/2018 10:59 AM    BUN/Creatinine ratio 25 04/17/2018 10:59 AM    GFR est AA 83 04/17/2018 10:59 AM    GFR est non-AA 72 04/17/2018 10:59 AM     Calcium 9.2 04/17/2018 10:59 AM    AST (SGOT) 19 04/17/2018 10:59 AM    Alk. phosphatase 79 04/17/2018 10:59 AM    Protein, total 6.6 04/17/2018 10:59 AM    Albumin 4.5 04/17/2018 10:59 AM    Globulin 3.6 11/28/2016 05:25 AM    A-G Ratio 2.1 04/17/2018 10:59 AM    ALT (SGPT)  14 04/17/2018 10:59 AM       Lab Results   Component Value Date/Time    Reticulocyte count 1.5 09/05/2016 09:40 AM    Iron % saturation 18 07/22/2017 10:57 AM    TIBC 198 (L) 07/22/2017 10:57 AM    Ferritin 1,447 (H) 07/22/2017 10:57 AM    Vitamin B12 542 07/30/2016 09:31 AM    Folate 14.8 07/30/2016 09:31 AM    Haptoglobin 412 (H) 09/05/2016 09:40 AM    LD 192 09/05/2016 09:40 AM    Sed rate, automated 67 (H) 11/28/2016 05:25 AM    C-Reactive protein 21.70 (H) 11/28/2016 05:25 AM    TSH 4.820 (H) 04/17/2018 10:59 AM     Lab Results   Component Value Date/Time    INR 1.0 02/15/2017 03:11 PM    aPTT 28.7 08/18/2009 01:05 PM       Assessment/Plan:  77 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  PS 0    1. Right Breast cancer stage: IA    Hormonal therapy: not indicated due to receptor (-) status    We explained to the patient that the goal of systemic adjuvant therapy is to improve the chances for cure and decrease the risk of relapse. We explained why a patient can have microscopic cancer spread now even though physical examination, laboratory studies and imaging studies are negative for cancer. We explained that the same treatments used now as adjuvant or preventive treatments rarely if ever are curative in women who develop metastases.     No evidence of recurrence    Mammogram in March 2019 negative at [facility] breast center, due in March 2020    She declined to take neratinib.    2. Emotional well being: She has excellent support and is coping well with her disease.    3. FH of breast cancer: BRCA 1/2 Ambry testing negative     4. Osteoporosis: On dexa from 2015; was on fosamax from Dr. Wanda Plump, started 08/2016, but stopped prior to surgery in 11/2016. DEXA 03/15/16 showed some improvement. Dr. Wanda Plump has recommended Reclast    We discussed the data from [place], [place] 2013, for the meta-analysis for adjuvant bisphosphonate use in breast cancer.  We discussed that in postmenopausal women, it appears that the bisphosphate zolendronic acid, given as in ABCSG 12 at 4 mg IV q 6 months x 3 years, is beneficial in improving bone DFS and OS.  We discussed side effects such as ONJ and the need to avoid invasive dental procedures while on these medications, renal damage, and hypocalcemia.    After this discussion, she is agreeable to zometa as above, will set up.    5. Back pain low/radiculopathy: Dr. Dema Severin, pain specialist, was following her. NM bone scan and xr show degeneration. Steroid injection in of Octobert; L-spine MRI 01/19/16 shows DDD.  States last steroid injection 09/24/16.     6. R leg DVT: provoked, in non weight bearing leg s/p surgery. Reports she stopped Eliquis 08/2017.     Thank you for this consult.  All of the patient's questions were answered today.        Sonda Rumble, MD

## 2018-05-28 NOTE — Progress Notes (Signed)
Jillian Bean is a 77 y.o. female here for follow-up of breast cancer.

## 2018-05-28 NOTE — Patient Instructions (Signed)
Zoledronic Acid (By injection)   Zoledronic Acid (zoe-le-DRON-ik AS-id)  Treats high blood calcium levels. Also treats bone damage caused by Paget disease, multiple myeloma, and cancers that spread to the bone. Also treats osteoporosis and reduces the risk of hip fractures in certain patients.   Brand Name(s): PremierPro Rx Zoledronic Acid, Reclast, Zoledronic Acid Novaplus, Zometa   There may be other brand names for this medicine.  When This Medicine Should Not Be Used:   This medicine is not right for everyone. You should not receive it if you had an allergic reaction to zoledronic acid, or if you are pregnant.  How to Use This Medicine:   Injectable  ?? A nurse or other health provider will give you this medicine.  ?? Your doctor will prescribe your dose and schedule. This medicine is given through a needle placed in a vein.  ?? Your doctor may tell you to drink extra liquids before your treatment to prevent kidney problems.  ?? Your doctor may also give you vitamin D and calcium supplements. Tell your doctor if you are not able to take these medicines.  ?? This medicine should come with a Medication Guide. Ask your pharmacist for a copy if you do not have one.  ?? Missed dose:You must use this medicine on a fixed schedule. Call your doctor or pharmacist if you miss a dose.  Drugs and Foods to Avoid:   Ask your doctor or pharmacist before using any other medicine, including over-the-counter medicines, vitamins, and herbal products.  ?? Do not use other medicines that also contain zoledronic acid. Do not use zoledronic acid together with another bisphosphonate medicine.  ?? Some foods and medicines can affect how zoledronic acid works. Tell your doctor if you are using digoxin, antibiotics, diuretics (water pills), an NSAID pain or arthritis medicine (such as aspirin, celecoxib, ibuprofen, naproxen), steroid medicines, or cancer medicines.  Warnings While Using This Medicine:    ?? It is not safe to take this medicine during pregnancy. It could harm an unborn baby. Tell your doctor right away if you become pregnant.  ?? Tell your doctor if you are breastfeeding, or if you have kidney disease, anemia, aspirin-sensitive asthma, bleeding problems, cancer, congestive heart failure, low blood calcium levels, stomach absorption problems, mineral imbalance, dental problems or gum disease. Also tell your doctor if you had surgery on your bowel or parathyroid or thyroid gland.  ?? This medicine may cause the following problems:  ?? Jaw or teeth problems  ?? Severe bone, joint, or muscle pain  ?? Increased risk of thigh bone fracture  ?? Low calcium levels in your blood  ?? You must have regular dental exams while you are being treated with this medicine. Tell your dentist or oral surgeon that you are using this medicine.  ?? Your doctor will do lab tests at regular visits to check on the effects of this medicine. Keep all appointments.  Possible Side Effects While Using This Medicine:   Call your doctor right away if you notice any of these side effects:  ?? Allergic reaction: Itching or hives, swelling in your face or hands, swelling or tingling in your mouth or throat, chest tightness, trouble breathing  ?? Chest pain, trouble breathing, fast or uneven heartbeat  ?? Decrease in how much or how often you urinate, blood in the urine, lower back or side pain, burning or painful urination  ?? Muscle spasm or twitching, or numbness or tingling in your fingers, feet, or around   your mouth  ?? Pain, swelling, or numbness in the mouth or jaw, loose teeth or other teeth problems  ?? Severe muscle, bone, or joint pain  ?? Unusual pain in your thigh, groin, or hip  If you notice these less serious side effects, talk with your doctor:   ?? Fever, chills, cough, sore throat, and body aches  ?? Headache  ?? Mild nausea, constipation, diarrhea, stomach pain or upset   ?? Redness, pain, or swelling of your skin where the needle is placed  If you notice other side effects that you think are caused by this medicine, tell your doctor.   Call your doctor for medical advice about side effects. You may report side effects to FDA at 1-800-FDA-1088  ?? 2017 [facility] Information is for End User's use only and may not be sold, redistributed or otherwise used for commercial purposes.  The above information is an educational aid only. It is not intended as medical advice for individual conditions or treatments. Talk to your doctor, nurse or pharmacist before following any medical regimen to see if it is safe and effective for you.

## 2018-06-18 NOTE — Telephone Encounter (Signed)
From: Duane Lope  To: [name], MD  Sent: 06/18/2018 11:25 AM EST  Subject: Non-Urgent Medical Question    I see in MyChart that I am overdue a Pneumococcal Vaccine. I last had a Pneumococcal Conjagate Vaccine on July 19, 2015. How often are they recommended.? Also, I had the low dosage flu shot on April 17, 2018.    Thanks  [employer]

## 2018-07-15 ENCOUNTER — Encounter

## 2018-07-16 MED ORDER — COLESEVELAM 625 MG TAB
625 mg | ORAL_TABLET | ORAL | 0 refills | Status: DC
Start: 2018-07-16 — End: 2018-08-14

## 2018-08-14 ENCOUNTER — Encounter

## 2018-08-15 MED ORDER — COLESEVELAM 625 MG TAB
625 mg | ORAL_TABLET | ORAL | 0 refills | Status: DC
Start: 2018-08-15 — End: 2018-09-15

## 2018-09-15 ENCOUNTER — Encounter

## 2018-09-15 MED ORDER — COLESEVELAM 625 MG TAB
625 mg | ORAL_TABLET | ORAL | 0 refills | Status: DC
Start: 2018-09-15 — End: 2018-10-15

## 2018-10-15 ENCOUNTER — Inpatient Hospital Stay: Admit: 2018-10-15 | Payer: MEDICARE | Primary: Internal Medicine

## 2018-10-15 ENCOUNTER — Ambulatory Visit: Admit: 2018-10-15 | Discharge: 2018-10-15 | Payer: MEDICARE | Attending: Internal Medicine | Primary: Internal Medicine

## 2018-10-15 ENCOUNTER — Ambulatory Visit: Attending: Internal Medicine | Primary: Internal Medicine

## 2018-10-15 DIAGNOSIS — Z0000 Encounter for general adult medical examination without abnormal findings: Secondary | ICD-10-CM

## 2018-10-15 LAB — METABOLIC PANEL, COMPREHENSIVE
A-G Ratio: 1.5 (ref 1.1–2.2)
ALT (SGPT): 24 U/L (ref 12–78)
AST (SGOT): 18 U/L (ref 15–37)
Albumin: 4 g/dL (ref 3.5–5.0)
Alk. phosphatase: 82 U/L (ref 45–117)
Anion gap: 5 mmol/L (ref 5–15)
BUN/Creatinine ratio: 18 (ref 12–20)
BUN: 14 MG/DL (ref 6–20)
Bilirubin, total: 0.4 MG/DL (ref 0.2–1.0)
CO2: 29 mmol/L (ref 21–32)
Calcium: 8.9 MG/DL (ref 8.5–10.1)
Chloride: 109 mmol/L — ABNORMAL HIGH (ref 97–108)
Creatinine: 0.76 MG/DL (ref 0.55–1.02)
GFR est AA: >60 mL/min/{1.73_m2} (ref >60)
GFR est non-AA: >60 mL/min/{1.73_m2} (ref >60)
Globulin: 2.6 g/dL (ref 2.0–4.0)
Glucose: 95 mg/dL (ref 65–100)
Potassium: 4.2 mmol/L (ref 3.5–5.1)
Protein, total: 6.6 g/dL (ref 6.4–8.2)
Sodium: 143 mmol/L (ref 136–145)

## 2018-10-15 LAB — VITAMIN D, 25 HYDROXY: Vitamin D 25-Hydroxy: 29.8 ng/mL — ABNORMAL LOW (ref 30–100)

## 2018-10-15 LAB — LIPID PANEL
CHOL/HDL Ratio: 3.7 (ref 0.0–5.0)
Chol/HDL Ratio: 3.7 (ref 0.0–5.0)
Cholesterol, Total: 230 MG/DL — ABNORMAL HIGH (ref <200)
Cholesterol, total: 230 MG/DL — ABNORMAL HIGH (ref <200)
HDL Cholesterol: 62 MG/DL
HDL: 62 MG/DL
LDL Calculated: 148.4 MG/DL — ABNORMAL HIGH (ref 0–100)
LDL, calculated: 148.4 MG/DL — ABNORMAL HIGH (ref 0–100)
Triglyceride: 98 MG/DL (ref <150)
Triglycerides: 98 MG/DL (ref <150)
VLDL Cholesterol Calculated: 19.6 MG/DL
VLDL, calculated: 19.6 MG/DL

## 2018-10-15 LAB — TSH 3RD GENERATION
TSH: 0.52 u[IU]/mL (ref 0.36–3.74)
TSH: 0.52 u[IU]/mL (ref 0.36–3.74)

## 2018-10-15 LAB — COMPREHENSIVE METABOLIC PANEL
ALT: 24 U/L (ref 12–78)
AST: 18 U/L (ref 15–37)
Albumin/Globulin Ratio: 1.5 (ref 1.1–2.2)
Albumin: 4 g/dL (ref 3.5–5.0)
Alkaline Phosphatase: 82 U/L (ref 45–117)
Anion Gap: 5 mmol/L (ref 5–15)
BUN: 14 MG/DL (ref 6–20)
Bun/Cre Ratio: 18 (ref 12–20)
CO2: 29 mmol/L (ref 21–32)
Calcium: 8.9 MG/DL (ref 8.5–10.1)
Chloride: 109 mmol/L — ABNORMAL HIGH (ref 97–108)
Creatinine: 0.76 MG/DL (ref 0.55–1.02)
EGFR IF NonAfrican American: >60 mL/min/{1.73_m2} (ref >60)
GFR African American: >60 mL/min/{1.73_m2} (ref >60)
Globulin: 2.6 g/dL (ref 2.0–4.0)
Glucose: 95 mg/dL (ref 65–100)
Potassium: 4.2 mmol/L (ref 3.5–5.1)
Sodium: 143 mmol/L (ref 136–145)
Total Bilirubin: 0.4 MG/DL (ref 0.2–1.0)
Total Protein: 6.6 g/dL (ref 6.4–8.2)

## 2018-10-15 LAB — VITAMIN D 25 HYDROXY: Vit D, 25-Hydroxy: 29.8 ng/mL — ABNORMAL LOW (ref 30–100)

## 2018-10-15 MED ORDER — COLESEVELAM 625 MG TAB
625 mg | ORAL_TABLET | ORAL | 3 refills | Status: DC
Start: 2018-10-15 — End: 2018-10-17

## 2018-10-15 NOTE — Progress Notes (Signed)
HISTORY OF PRESENT ILLNESS  Jillian Bean is a 78 y.o. female.  HPI  Seen at follow up for meds and also wellness visit.  Issues:  1. Dyslipidemia, on Welchol, three tabs daily, which she tolerates.  Due for lipids and needs refills.  No cardiac symptoms.  2. History of breast cancer, being followed by Dr. Lyndel Safe and Dr. Michaele Offer.  She is doing well.  She also sees her surgeon, Dr. Elie Confer.  3. Osteoporosis.  She is getting ready to start Reclast through Dr. Valinda Hoar office.  Will get vitamin D and chemistries checked today in anticipation.  Discussed potential risks, including osteonecrosis of the jaw and she is well aware.  4. Some hearing loss.  Will refer for hearing screening.  She has occasional pain on left side of her neck, but it is rare, not associated with chest pain or shortness of breath.  Overall her mobility is improved, much improved from a year ago.      Review of Systems   Constitutional: Positive for malaise/fatigue. Negative for chills, fever and weight loss.   HENT: Positive for hearing loss.    Eyes: Negative for blurred vision.   Respiratory: Negative for cough, shortness of breath and wheezing.    Cardiovascular: Negative for chest pain, palpitations, orthopnea, leg swelling and PND.   Gastrointestinal: Negative for abdominal pain, constipation, diarrhea, heartburn, nausea and vomiting.   Genitourinary: Negative for dysuria.   Musculoskeletal: Negative for myalgias.   Skin: Negative for rash.   Neurological: Negative for dizziness and headaches.   Psychiatric/Behavioral: Negative for depression and memory loss. The patient is not nervous/anxious.        Physical Exam  Vitals signs and nursing note reviewed.   Constitutional:       Appearance: She is well-developed.   HENT:      Head: Normocephalic and atraumatic.      Mouth/Throat:      Mouth: Mucous membranes are moist.   Eyes:      Extraocular Movements: Extraocular movements intact.      Pupils: Pupils are equal, round, and  reactive to light.   Neck:      Musculoskeletal: Normal range of motion and neck supple.      Thyroid: No thyromegaly.      Vascular: No carotid bruit.   Cardiovascular:      Rate and Rhythm: Normal rate and regular rhythm.      Heart sounds: Normal heart sounds, S1 normal and S2 normal. No murmur.   Pulmonary:      Effort: Pulmonary effort is normal. No respiratory distress.      Breath sounds: Normal breath sounds. No wheezing or rales.   Musculoskeletal: Normal range of motion.      Right lower leg: No edema.      Left lower leg: No edema.      Comments: Gets on and  Off exam table independently   Lymphadenopathy:      Cervical: No cervical adenopathy.   Skin:     Findings: No rash.   Neurological:      Mental Status: She is alert and oriented to person, place, and time.   Psychiatric:         Behavior: Behavior normal.         ASSESSMENT and PLAN  Diagnoses and all orders for this visit:    1. Medicare annual wellness visit, subsequent  Refer for hearing eval  2. 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 Neck pain-intermittent and low grade  Managing with prn tylenol  Follow up if signs and symptoms worsen or change. After hours number given.       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     History     Patient Active Problem List   Diagnosis Code   ??? Hypercholesteremia E78.00   ??? OA (osteoarthritis) M19.90   ??? Back pain M54.9   ??? Plantar fasciitis  M72.2   ??? Colonic polyps    ??? S/P colonoscopy Z98.890   ??? 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 Iron deficiency anemia D50.9   ??? 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 Hypothyroidism E03.9   ??? Malignant neoplasm of right female breast ([place]) C50.911   ??? Advanced care planning/counseling discussion Z71.89   ??? 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 S/P hysterectomy Z90.710     Past Medical History:   Diagnosis Date   ??? Anemia    ??? Back pain 09/01/2007   ??? Cancer (HCC)     R breast   ??? Chronic pain     BACK PAIN   ??? Coagulation disorder ([place])     IRON DEFFICIENCY ANEMIA   ??? Colonic polyps 08/31/1998   ??? 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 DVT (deep venous thrombosis) ([place]) 01/2017   ??? Hiatal hernia 07/11/2011    Dr.sobieski   ??? Hypercholesteremia 09/01/2007   ??? Hypothyroidism 09/01/2003   ??? 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 Nausea & vomiting    ??? OA (osteoarthritis) 09/01/2007   ??? Other ill-defined conditions(799.89)     VERTIGO   ??? Plantar fasciitis 09/01/2007   ??? S/P colonoscopy 10/10/2007   ??? Thromboembolus ([place]) 01/2017    RIGHT LEG   ??? Thyroid disease       Past Surgical History:   Procedure Laterality Date   ??? COLONOSCOPY N/A 11/12/2016    COLONOSCOPY performed by Burnett Harry, MD at [place]   ??? COLONOSCOPY,DIAGNOSTIC  11/12/2016        ??? ENDOSCOPY, COLON, DIAGNOSTIC  7/12/18/08    7/05 Dr Maudry Diego   ??? HX BREAST LUMPECTOMY Right 2016   ??? HX BREAST REDUCTION  1993   ??? HX CATARACT REMOVAL Right 01/21/2018   ??? HX CATARACT REMOVAL Left 03/11/2018   ??? HX COLONOSCOPY  2014    Dr Illene Silver   ??? HX GI      COLONOSCOPY   ??? HX HEENT  04/07/10    thyroid biopsy neg dr Leward Quan   ??? HX HEENT      wisdom teeth extraction   ??? HX HYSTERECTOMY  1975     PARTIAL   ??? HX KNEE REPLACEMENT Left 08/22/09   ??? HX KNEE REPLACEMENT Right 08/13/12   ??? HX ORTHOPAEDIC Right 11/26/2016    cleaned infection in joint   ??? HX ORTHOPAEDIC Right 03/2017    revision total knee replacement   ??? HX OTHER SURGICAL  2013    MINIMALLY INVASIVE LUMBAR DECOMPRESSION   ??? NEUROLOGICAL PROCEDURE UNLISTED  2013    LUMBAR DECOMPRESSION   ??? UPPER GI ENDOSCOPY,BIOPSY  11/12/2016          Current Outpatient Medications   Medication Sig Dispense Refill   ??? colesevelam (WELCHOL) 625 mg tablet TAKE 3 TABLETS DAILY       *GLENMARK* 270 Tab 3   ??? levothyroxine (SYNTHROID) 125 mcg tablet 1 po every day and extra 1/2 tab on sunday 100 Tab 3   ??? naproxen sodium (ALEVE PO) Take 1 Tab by mouth daily as needed.     ??? multivitamin (ONE A DAY) tablet Take 1 Tab by mouth daily.     ??? cholecalciferol, vitamin D3, (VITAMIN D3 PO) Take 1,000 Units by mouth daily.     ??? LOW-DOSE ASPIRIN PO Take 81 mg by mouth daily. Taking 2 tab daily     ??? polyethylene glycol (MIRALAX) 17 gram packet Take 17 g by mouth as needed.     ??? docusate sodium (COLACE) 100 mg capsule Take 100 mg by mouth daily.       Allergies   Allergen Reactions   ??? Atorvastatin Myalgia   ??? Zocor [Simvastatin] Other (comments) and Diarrhea     Other reaction(s): Adverse reaction to substance   Myalgias     ??? Celebrex [Celecoxib] Other (comments)     Aggitation   ??? Gabapentin Other (comments)     FELT UNSTEADY ON MY FEET   ??? Levaquin [Levofloxacin] Palpitations   ??? Nexium [Esomeprazole Magnesium] Diarrhea   ??? Other Medication Swelling     Latanoprost drops   ??? Oxycontin [Oxycodone] Itching   ??? Pravastatin Nausea and Vomiting   ??? Statins-Hmg-Coa Reductase Inhibitors Other (comments)     lymphacytic colitis     ??? Yellow Dye Hives       Family History   Problem Relation Age of Onset   ??? Cancer Mother         Breast   ??? Hypertension Mother    ??? Heart Failure Father         MI   ??? Heart Attack Father    ??? Hypertension Father    ??? MS Brother    ??? Anesth Problems Neg Hx       Social History     Tobacco Use   ??? Smoking status: Never Smoker   ??? Smokeless tobacco: Never Used   Substance Use Topics   ??? Alcohol use: No       Depression Risk Factor Screening:     3 most recent PHQ Screens 10/15/2018   Little interest or pleasure in doing things Not at all   Feeling down, depressed, irritable, or hopeless Not at all   Total Score PHQ 2 0       Alcohol Risk Factor Screening:   Do you average 1 drink per night or more than 7 drinks a week:  No    On  any one occasion in the past three months have you have had more than 3 drinks containing alcohol:  No      Functional Ability and Level of Safety:   Hearing: The patient needs further evaluation.    Activities of Daily Living:  The home contains: no safety equipment.  Patient does total self care    Ambulation: with no difficulty    Fall Risk:  Fall Risk Assessment, last 12 mths 01/02/2018   Able to walk? Yes   Fall in past 12 months? No   Fall with injury? -   Number of falls in past 12 months -   Fall Risk Score -       Abuse Screen:  Patient is not abused    Cognitive Screening   Has your family/caregiver stated any concerns about your memory: no      Patient Care Team   Patient Care Team:  [name], MD as PCP - General  Wanda Plump Lowry Bowl, MD as PCP - [facility] Empaneled Provider  Telford Nab, MD (Cardiology)  Joyce Gross, MD as Physician (Hematology and Oncology)  Deal, Brien Few, MD as Surgeon (General Surgery)  Lyndel Safe, MD as Physician (Radiation Oncology)  Angelina Pih, NP as Nurse Practitioner (Oncology)  Blair Hailey, MD (Infectious Diseases)    Assessment/Plan   Education and counseling provided:  Are appropriate based on today's review and evaluation  Pneumococcal Vaccine  Influenza Vaccine  Screening Mammography  Screening Pap and pelvic (covered once every 2 years)  Bone mass measurement (DEXA)  Screening for glaucoma    Diagnoses and all orders for this visit:    1. Medicare annual wellness visit,  subsequent    2. 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 Pneumococcal 65+ years (2 of 2 - PPSV23) 07/18/2016   ??? GLAUCOMA SCREENING Q2Y  03/30/2017   ??? Medicare Yearly Exam  10/12/2018

## 2018-10-15 NOTE — Progress Notes (Signed)
Message sent about labs  Add vit d 4000

## 2018-10-15 NOTE — Progress Notes (Signed)
HISTORY OF PRESENT ILLNESS  Jillian Bean is a 78 y.o. female.  HPI  Seen at follow up for meds and also wellness visit.  Issues:  1. Dyslipidemia, on Welchol, three tabs daily, which she tolerates.  Due for lipids and needs refills.  No cardiac symptoms.  2. History of breast cancer, being followed by Dr. Lyndel Safe and Dr. Michaele Offer.  She is doing well.  She also sees her surgeon, Dr. Elie Confer.  3. Osteoporosis.  She is getting ready to start Reclast through Dr. Valinda Hoar office.  Will get vitamin D and chemistries checked today in anticipation.  Discussed potential risks, including osteonecrosis of the jaw and she is well aware.  4. Some hearing loss.  Will refer for hearing screening.  She has occasional pain on left side of her neck, but it is rare, not associated with chest pain or shortness of breath.  Overall her mobility is improved, much improved from a year ago.      Review of Systems   Constitutional: Positive for malaise/fatigue. Negative for chills, fever and weight loss.   HENT: Positive for hearing loss.    Eyes: Negative for blurred vision.   Respiratory: Negative for cough, shortness of breath and wheezing.    Cardiovascular: Negative for chest pain, palpitations, orthopnea, leg swelling and PND.   Gastrointestinal: Negative for abdominal pain, constipation, diarrhea, heartburn, nausea and vomiting.   Genitourinary: Negative for dysuria.   Musculoskeletal: Negative for myalgias.   Skin: Negative for rash.   Neurological: Negative for dizziness and headaches.   Psychiatric/Behavioral: Negative for depression and memory loss. The patient is not nervous/anxious.        Physical Exam  Vitals signs and nursing note reviewed.   Constitutional:       Appearance: She is well-developed.   HENT:      Head: Normocephalic and atraumatic.      Mouth/Throat:      Mouth: Mucous membranes are moist.   Eyes:      Extraocular Movements: Extraocular movements intact.       Pupils: Pupils are equal, round, and reactive to light.   Neck:      Musculoskeletal: Normal range of motion and neck supple.      Thyroid: No thyromegaly.      Vascular: No carotid bruit.   Cardiovascular:      Rate and Rhythm: Normal rate and regular rhythm.      Heart sounds: Normal heart sounds, S1 normal and S2 normal. No murmur.   Pulmonary:      Effort: Pulmonary effort is normal. No respiratory distress.      Breath sounds: Normal breath sounds. No wheezing or rales.   Musculoskeletal: Normal range of motion.      Right lower leg: No edema.      Left lower leg: No edema.      Comments: Gets on and  Off exam table independently   Lymphadenopathy:      Cervical: No cervical adenopathy.   Skin:     Findings: No rash.   Neurological:      Mental Status: She is alert and oriented to person, place, and time.   Psychiatric:         Behavior: Behavior normal.         ASSESSMENT and PLAN  Diagnoses and all orders for this visit:    1. Medicare annual wellness visit, subsequent  Refer for hearing eval  2. 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 Neck pain-intermittent and low grade  Managing with prn tylenol  Follow up if signs and symptoms worsen or change. After hours number given.       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     History     Patient Active Problem List   Diagnosis Code   ??? Hypercholesteremia E78.00   ??? OA (osteoarthritis) M19.90    ??? Back pain M54.9   ??? Plantar fasciitis M72.2   ??? Colonic polyps    ??? S/P colonoscopy Z98.890   ??? 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 Iron deficiency anemia D50.9   ??? 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 Hypothyroidism E03.9   ??? Malignant neoplasm of right female breast ([place]) C50.911   ??? Advanced care planning/counseling discussion Z71.89   ??? 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 S/P hysterectomy Z90.710     Past Medical History:   Diagnosis Date   ??? Anemia    ??? Back pain 09/01/2007   ??? Cancer (HCC)     R breast   ??? Chronic pain     BACK PAIN   ??? Coagulation disorder ([place])     IRON DEFFICIENCY ANEMIA   ??? Colonic polyps 08/31/1998   ??? 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 DVT (deep venous thrombosis) ([place]) 01/2017   ??? Hiatal hernia 07/11/2011    Dr.sobieski   ??? Hypercholesteremia 09/01/2007   ??? Hypothyroidism 09/01/2003   ??? 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 Nausea & vomiting    ??? OA (osteoarthritis) 09/01/2007   ??? Other ill-defined conditions(799.89)     VERTIGO   ??? Plantar fasciitis 09/01/2007   ??? S/P colonoscopy 10/10/2007   ??? Thromboembolus ([place]) 01/2017    RIGHT LEG   ??? Thyroid disease       Past Surgical History:   Procedure Laterality Date   ??? COLONOSCOPY N/A 11/12/2016    COLONOSCOPY performed by Burnett Harry, MD at [place]   ??? COLONOSCOPY,DIAGNOSTIC  11/12/2016        ??? ENDOSCOPY, COLON, DIAGNOSTIC  7/12/18/08    7/05 Dr Maudry Diego   ??? HX BREAST LUMPECTOMY Right 2016   ??? HX BREAST REDUCTION  1993   ??? HX CATARACT REMOVAL Right 01/21/2018   ??? HX CATARACT REMOVAL Left 03/11/2018   ??? HX COLONOSCOPY  2014    Dr Illene Silver   ??? HX GI      COLONOSCOPY   ??? HX HEENT  04/07/10    thyroid biopsy neg dr Leward Quan   ??? HX HEENT       wisdom teeth extraction   ??? HX HYSTERECTOMY  1975    PARTIAL   ??? HX KNEE REPLACEMENT Left 08/22/09   ??? HX KNEE REPLACEMENT Right 08/13/12   ??? HX ORTHOPAEDIC Right 11/26/2016    cleaned infection in joint   ??? HX ORTHOPAEDIC Right 03/2017    revision total knee replacement   ??? HX OTHER SURGICAL  2013    MINIMALLY INVASIVE LUMBAR DECOMPRESSION   ??? NEUROLOGICAL PROCEDURE UNLISTED  2013    LUMBAR DECOMPRESSION   ??? UPPER GI ENDOSCOPY,BIOPSY  11/12/2016          Current Outpatient Medications   Medication Sig Dispense Refill   ??? colesevelam (WELCHOL) 625 mg tablet TAKE 3 TABLETS DAILY       *GLENMARK* 270 Tab 3   ??? levothyroxine (SYNTHROID) 125 mcg tablet 1 po every day and extra 1/2 tab on sunday 100 Tab 3   ??? naproxen sodium (ALEVE PO) Take 1 Tab by mouth daily as needed.     ??? multivitamin (ONE A DAY) tablet Take 1 Tab by mouth daily.     ??? cholecalciferol, vitamin D3, (VITAMIN D3 PO) Take 1,000 Units by mouth daily.     ??? LOW-DOSE ASPIRIN PO Take 81 mg by mouth daily. Taking 2 tab daily     ??? polyethylene glycol (MIRALAX) 17 gram packet Take 17 g by mouth as needed.     ??? docusate sodium (COLACE) 100 mg capsule Take 100 mg by mouth daily.       Allergies   Allergen Reactions   ??? Atorvastatin Myalgia   ??? Zocor [Simvastatin] Other (comments) and Diarrhea     Other reaction(s): Adverse reaction to substance   Myalgias     ??? Celebrex [Celecoxib] Other (comments)     Aggitation   ??? Gabapentin Other (comments)     FELT UNSTEADY ON MY FEET   ??? Levaquin [Levofloxacin] Palpitations   ??? Nexium [Esomeprazole Magnesium] Diarrhea   ??? Other Medication Swelling     Latanoprost drops   ??? Oxycontin [Oxycodone] Itching   ??? Pravastatin Nausea and Vomiting   ??? Statins-Hmg-Coa Reductase Inhibitors Other (comments)     lymphacytic colitis     ??? Yellow Dye Hives       Family History   Problem Relation Age of Onset   ??? Cancer Mother         Breast   ??? Hypertension Mother    ??? Heart Failure Father         MI   ??? Heart Attack Father     ??? Hypertension Father    ??? MS Brother    ??? Anesth Problems Neg Hx      Social History     Tobacco Use   ??? Smoking status: Never Smoker   ??? Smokeless tobacco: Never Used   Substance Use Topics   ??? Alcohol use: No       Depression Risk Factor Screening:     3 most recent PHQ Screens 10/15/2018   Little interest or pleasure in doing things Not at all   Feeling down, depressed, irritable, or hopeless Not at all   Total Score PHQ 2 0       Alcohol Risk Factor Screening:   Do you average 1 drink per night or more than 7 drinks a week:  No    On  any one occasion in the past three months have you have had more than 3 drinks containing alcohol:  No      Functional Ability and Level of Safety:   Hearing: The patient needs further evaluation.    Activities of Daily Living:  The home contains: no safety equipment.  Patient does total self care    Ambulation: with no difficulty    Fall Risk:  Fall Risk Assessment, last 12 mths 01/02/2018   Able to walk? Yes   Fall in past 12 months? No   Fall with injury? -   Number of falls in past 12 months -   Fall Risk Score -       Abuse Screen:  Patient is not abused    Cognitive Screening   Has your family/caregiver stated any concerns about your memory: no      Patient Care Team   Patient Care Team:  [name], MD as PCP - General  Wanda Plump Lowry Bowl, MD as PCP - [facility] Empaneled Provider  Telford Nab, MD (Cardiology)  Joyce Gross, MD as Physician (Hematology and Oncology)  Deal, Brien Few, MD as Surgeon (General Surgery)  Lyndel Safe, MD as Physician (Radiation Oncology)  Angelina Pih, NP as Nurse Practitioner (Oncology)  Blair Hailey, MD (Infectious Diseases)    Assessment/Plan   Education and counseling provided:  Are appropriate based on today's review and evaluation  Pneumococcal Vaccine  Influenza Vaccine  Screening Mammography  Screening Pap and pelvic (covered once every 2 years)  Bone mass measurement (DEXA)  Screening for glaucoma     Diagnoses and all orders for this visit:    1. Medicare annual wellness visit, subsequent    2. 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 Pneumococcal 65+ years (2 of 2 - PPSV23) 07/18/2016   ??? GLAUCOMA SCREENING Q2Y  03/30/2017   ??? Medicare Yearly Exam  10/12/2018

## 2018-10-15 NOTE — Patient Instructions (Signed)
Medicare Wellness Visit, Female     The best way to live healthy is to have a lifestyle where you eat a well-balanced diet, exercise regularly, limit alcohol use, and quit all forms of tobacco/nicotine, if applicable.     Regular preventive services are another way to keep healthy. Preventive services (vaccines, screening tests, monitoring & exams) can help personalize your care plan, which helps you manage your own care. Screening tests can find health problems at the earliest stages, when they are easiest to treat.   [facility] follows the current, evidence-based guidelines published by the [place] [employer] (USPSTF) when recommending preventive services for our patients. Because we follow these guidelines, sometimes recommendations change over time as research supports it. (For example, mammograms used to be recommended annually. Even though Medicare will still pay for an annual mammogram, the newer guidelines recommend a mammogram every two years for women of average risk).  Of course, you and your doctor may decide to screen more often for some diseases, based on your risk and your co-morbidities (chronic disease you are already diagnosed with).     Preventive services for you include:  - Medicare offers their members a free annual wellness visit, which is time for you and your primary care provider to discuss and plan for your preventive service needs. Take advantage of this benefit every year!  -All adults over the age of 78 should receive the recommended pneumonia vaccines. Current USPSTF guidelines recommend a series of two vaccines for the best pneumonia protection.   -All adults should have a flu vaccine yearly and a tetanus vaccine every 10 years.   -All adults age 41 and older should receive the shingles vaccines (series of two vaccines).      -All adults age 38-70 who are overweight should have a diabetes screening test once every three years.    -All adults born between [birth year] and 1966 should be screened once for Hepatitis C.  -Other screening tests and preventive services for persons with diabetes include: an eye exam to screen for diabetic retinopathy, a kidney function test, a foot exam, and stricter control over your cholesterol.   -Cardiovascular screening for adults with routine risk involves an electrocardiogram (ECG) at intervals determined by your doctor.   -Colorectal cancer screenings should be done for adults age 30-75 with no increased risk factors for colorectal cancer.  There are a number of acceptable methods of screening for this type of cancer. Each test has its own benefits and drawbacks. Discuss with your doctor what is most appropriate for you during your annual wellness visit. The different tests include: colonoscopy (considered the best screening method), a fecal occult blood test, a fecal DNA test, and sigmoidoscopy.    -A bone mass density test is recommended when a woman turns 65 to screen for osteoporosis. This test is only recommended one time, as a screening. Some providers will use this same test as a disease monitoring tool if you already have osteoporosis.  -Breast cancer screenings are recommended every other year for women of normal risk, age 73-74.  -Cervical cancer screenings for women over age 28 are only recommended with certain risk factors.     Here is a list of your current Health Maintenance items (your personalized list of preventive services) with a due date:  Health Maintenance Due   Topic Date Due   ??? Pneumococcal Vaccine (2 of 2 - PPSV23) 07/18/2016   ??? Glaucoma Screening  03/30/2017   ??? Annual Well Visit  10/12/2018

## 2018-10-16 NOTE — Progress Notes (Signed)
Message sent about labs  Add vit d 4000

## 2018-10-17 ENCOUNTER — Encounter

## 2018-10-17 MED ORDER — COLESEVELAM 625 MG TAB
625 mg | ORAL_TABLET | ORAL | 3 refills | Status: DC
Start: 2018-10-17 — End: 2019-11-01

## 2018-10-17 NOTE — Telephone Encounter (Signed)
From: Jillian Bean  To: [name], MD  Sent: 10/16/2018 4:57 PM EST  Subject: Update Medical Information    October 16, 2018, I received the following pneumococcal vaccine: PREVNAR 13 INJ, 0.5ML at [facility]. Please update my records.    Thanks, Jillian Bean

## 2018-11-03 NOTE — Telephone Encounter (Signed)
Patient called and wanted to push out her appt.  Spoke with the team and they said May was ok.      Schedule appt for May 18th 9:00.      Can we schedule her Zometa for the same day.

## 2018-12-01 ENCOUNTER — Encounter: Payer: MEDICARE | Primary: Internal Medicine

## 2018-12-01 ENCOUNTER — Encounter: Attending: Nurse Practitioner | Primary: Internal Medicine

## 2018-12-01 ENCOUNTER — Encounter: Attending: Specialist | Primary: Internal Medicine

## 2018-12-29 ENCOUNTER — Inpatient Hospital Stay: Admit: 2018-12-29 | Payer: MEDICARE | Primary: Internal Medicine

## 2018-12-29 ENCOUNTER — Ambulatory Visit: Admit: 2018-12-29 | Discharge: 2018-12-29 | Payer: MEDICARE | Attending: Specialist | Primary: Internal Medicine

## 2018-12-29 ENCOUNTER — Ambulatory Visit: Attending: Specialist | Primary: Internal Medicine

## 2018-12-29 DIAGNOSIS — Z853 Personal history of malignant neoplasm of breast: Secondary | ICD-10-CM

## 2018-12-29 DIAGNOSIS — C50911 Malignant neoplasm of unspecified site of right female breast: Secondary | ICD-10-CM

## 2018-12-29 LAB — METABOLIC PANEL, COMPREHENSIVE
A-G Ratio: 1.2 (ref 1.1–2.2)
ALT (SGPT): 21 U/L (ref 12–78)
AST (SGOT): 18 U/L (ref 15–37)
Albumin: 3.9 g/dL (ref 3.5–5.0)
Alk. phosphatase: 89 U/L (ref 45–117)
Anion gap: 1 mmol/L — ABNORMAL LOW (ref 5–15)
BUN/Creatinine ratio: 16 (ref 12–20)
BUN: 12 MG/DL (ref 6–20)
Bilirubin, total: 0.5 MG/DL (ref 0.2–1.0)
CO2: 29 mmol/L (ref 21–32)
Calcium: 8.6 MG/DL (ref 8.5–10.1)
Chloride: 110 mmol/L — ABNORMAL HIGH (ref 97–108)
Creatinine: 0.74 MG/DL (ref 0.55–1.02)
GFR est AA: >60 mL/min/{1.73_m2} (ref >60)
GFR est non-AA: >60 mL/min/{1.73_m2} (ref >60)
Globulin: 3.3 g/dL (ref 2.0–4.0)
Glucose: 86 mg/dL (ref 65–100)
Potassium: 4.2 mmol/L (ref 3.5–5.1)
Protein, total: 7.2 g/dL (ref 6.4–8.2)
Sodium: 140 mmol/L (ref 136–145)

## 2018-12-29 LAB — COMPREHENSIVE METABOLIC PANEL
ALT: 21 U/L (ref 12–78)
AST: 18 U/L (ref 15–37)
Albumin/Globulin Ratio: 1.2 (ref 1.1–2.2)
Albumin: 3.9 g/dL (ref 3.5–5.0)
Alkaline Phosphatase: 89 U/L (ref 45–117)
Anion Gap: 1 mmol/L — ABNORMAL LOW (ref 5–15)
BUN: 12 MG/DL (ref 6–20)
Bun/Cre Ratio: 16 (ref 12–20)
CO2: 29 mmol/L (ref 21–32)
Calcium: 8.6 MG/DL (ref 8.5–10.1)
Chloride: 110 mmol/L — ABNORMAL HIGH (ref 97–108)
Creatinine: 0.74 MG/DL (ref 0.55–1.02)
EGFR IF NonAfrican American: >60 mL/min/{1.73_m2} (ref >60)
GFR African American: >60 mL/min/{1.73_m2} (ref >60)
Globulin: 3.3 g/dL (ref 2.0–4.0)
Glucose: 86 mg/dL (ref 65–100)
Potassium: 4.2 mmol/L (ref 3.5–5.1)
Sodium: 140 mmol/L (ref 136–145)
Total Bilirubin: 0.5 MG/DL (ref 0.2–1.0)
Total Protein: 7.2 g/dL (ref 6.4–8.2)

## 2018-12-29 MED ORDER — ZOLEDRONIC ACID 4MG/100ML IN MANNITOL & WATER PREMIX
4 mg/100 mL | Freq: Once | INTRAVENOUS | Status: AC
Start: 2018-12-29 — End: 2018-12-29
  Administered 2018-12-29: 16:00:00 via INTRAVENOUS

## 2018-12-29 MED ORDER — SODIUM CHLORIDE 0.9 % IV
INTRAVENOUS | Status: AC
Start: 2018-12-29 — End: 2018-12-29
  Administered 2018-12-29: 16:00:00 via INTRAVENOUS

## 2018-12-29 MED FILL — SODIUM CHLORIDE 0.9 % IV: INTRAVENOUS | Qty: 1000

## 2018-12-29 NOTE — Progress Notes (Signed)
OUTPATIENT INFUSION CENTER    DISCHARGE INSTRUCTIONS FOR:  ZOMETA (Zoledronic Acid):    1.  Be sure to inform your Dentist that you are taking this drug prior to invasive dental   procedures.  You should avoid major dental work while you are being treated with this     medicine.  Report any signs/symptoms of jaw pain to your physician immediately.    2.  This medicine needs to be taken on a fixed schedule.  If you miss a dose, make sure your doctor is informed.  You will also need to have frequent lab work done; try to keep all of your appointments.   Your doctor may also prescribe Vitamin D and calcium supplements.    3.  Make sure your doctor knows if you are taking fluid pills, arthritis medicines or antibiotics,  or if you have a history of problems with your gums, mouth or teeth.    4. Drink some extra fluids during the 12-hour period before and after you receive Zometa.  Report any changes in urinary patterns (example: a decrease in how often you urinate).  Also report rapid weight gain, swelling of the hands, ankles or feet, unusual tiredness or weakness or unusual bleeding or bruising.    5.  You may resume your normal activities after your infusion.  Some people may have mild side effects from Zometa.  These side effects usually improve after the first infusion.  They may include:  Mild nausea, diarrhea, constipation or upset stomach;  Red or irritated eyes;  redness or itching at IV site;  Mild skin rash, mild numbness, tingling, or burning in extremities;  Headache, dizziness, mild muscle or joint pain, trouble sleeping;  Nasal congestion, runny nose, sneezing    6.   Signs/Symptoms of an allergic reaction may require immediate medical attention.        These are rare, but may include one or more of the following:     Skin - Swelling, blistering, weeping, crusting, rash, itching or;  Wheezing, cough, sore throat, chest tightness, chest pain or shortness of breath, irregular heart beat;  Swelling of the  face, eyelids, lips, tongue, or throat; dry mouth;  Severe red (bloodshot), itchy, swollen, watery or painful eyes;  Stomach pain, loss of appetite, nausea, vomiting, or bloody diarrhea;  Severe headache, sleepiness or changes in personality;  Numbness, tingling or pain around the mouth, teeth, or jaw.    Contact your physician if you have questions or concerns, or experience any of the above symptoms.    Chance E Laning, Signature: _______________________________ 12/29/2018  Theda Belfast

## 2018-12-29 NOTE — Progress Notes (Signed)
[facility]  [address] [place]   [address]  W: [phone]   F: [phone]      F/u HEME/ONC CONSULT    Reason for visit: evaluation for treatment for breast cancer    Consulting physician: Dr. Gilford Rile    HPI:   Jillian Bean is a 78 y.o.  female who I was asked to see in consultation at the request of Dr. Elie Confer for evaluation for systemic therapy for breast cancer.    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 Right lumpectomy on 11/10/14 shows IDC, 1.7 cm, gr 3, 0/5 LN, no LVI.  PT1cN0Mx.    [place] q 3 weeks x 6: 12/21/14- 04/12/15  C#5 held 1 week on 03/15/15 due to thrombocytopenia   [place] Herceptin: 05/03/15-11/29/15    S/p XRT 06/03/15    Bone Marrow Bx 10/09/16: Variably normocellular marrow with trilineage hematopoiesis.    Interval History: In today for follow up. No complaints today.      DX   Encounter Diagnoses   Name Primary?   ??? History of breast cancer Yes   ??? Osteoporosis without current pathological fracture, unspecified osteoporosis type    ??? Acute bilateral low back pain with sciatica, sciatica laterality unspecified      Past Medical History:   Diagnosis Date   ??? Anemia    ??? Back pain 09/01/2007   ??? Cancer (HCC)     R breast   ??? Chronic pain     BACK PAIN   ??? Coagulation disorder ([place])     IRON DEFFICIENCY ANEMIA   ??? Colonic polyps 08/31/1998   ??? 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 DVT (deep venous thrombosis) ([place]) 01/2017   ??? Hiatal hernia 07/11/2011    Dr.sobieski   ??? Hypercholesteremia 09/01/2007   ??? Hypothyroidism 09/01/2003   ??? 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 Nausea & vomiting    ??? OA (osteoarthritis) 09/01/2007   ??? Other ill-defined conditions(799.89)     VERTIGO   ??? Plantar fasciitis 09/01/2007   ??? S/P  colonoscopy 10/10/2007   ??? Thromboembolus ([place]) 01/2017    RIGHT LEG   ??? Thyroid disease      Past Surgical History:   Procedure Laterality Date   ??? COLONOSCOPY N/A 11/12/2016    COLONOSCOPY performed by Burnett Harry, MD at [facility]   ??? COLONOSCOPY,DIAGNOSTIC  11/12/2016        ??? ENDOSCOPY, COLON, DIAGNOSTIC  7/12/18/08    7/05 Dr Maudry Diego   ??? HX BREAST LUMPECTOMY Right 2016   ??? HX BREAST REDUCTION  1993   ??? HX CATARACT REMOVAL Right 01/21/2018   ??? HX CATARACT REMOVAL Left 03/11/2018   ??? HX COLONOSCOPY  2014    Dr Illene Silver   ??? HX GI      COLONOSCOPY   ??? HX HEENT  04/07/10    thyroid biopsy neg dr Leward Quan   ??? HX HEENT      wisdom teeth extraction   ??? HX HYSTERECTOMY  1975    PARTIAL   ??? HX KNEE REPLACEMENT Left 08/22/09   ??? HX KNEE REPLACEMENT Right 08/13/12   ??? HX ORTHOPAEDIC Right 11/26/2016  cleaned infection in joint   ??? HX ORTHOPAEDIC Right 03/2017    revision total knee replacement   ??? HX OTHER SURGICAL  2013    MINIMALLY INVASIVE LUMBAR DECOMPRESSION   ??? NEUROLOGICAL PROCEDURE UNLISTED  2013    LUMBAR DECOMPRESSION   ??? UPPER GI ENDOSCOPY,BIOPSY  11/12/2016          Social History     Socioeconomic History   ??? Marital status: MARRIED     Spouse name: Not on file   ??? Number of children: Not on file   ??? Years of education: Not on file   ??? Highest education level: Not on file   Tobacco Use   ??? Smoking status: Never Smoker   ??? Smokeless tobacco: Never Used   Substance and Sexual Activity   ??? Alcohol use: No   ??? Drug use: No   ??? Sexual activity: Not Currently     Family History   Problem Relation Age of Onset   ??? Cancer Mother         Breast   ??? Hypertension Mother    ??? Heart Failure Father         MI   ??? Heart Attack Father    ??? Hypertension Father    ??? MS Brother    ??? Anesth Problems Neg Hx        Current Outpatient Medications   Medication Sig Dispense Refill   ??? colesevelam (WELCHOL) 625 mg tablet TAKE 4 TABLETS DAILY       *GLENMARK* 360 Tab 3   ??? levothyroxine (SYNTHROID) 125 mcg tablet 1 po every day and  extra 1/2 tab on sunday 100 Tab 3   ??? naproxen sodium (ALEVE PO) Take 1 Tab by mouth daily as needed.     ??? multivitamin (ONE A DAY) tablet Take 1 Tab by mouth daily.     ??? cholecalciferol, vitamin D3, (VITAMIN D3 PO) Take 1,000 Units by mouth daily.     ??? LOW-DOSE ASPIRIN PO Take 81 mg by mouth daily. Taking 2 tab daily     ??? polyethylene glycol (MIRALAX) 17 gram packet Take 17 g by mouth as needed.     ??? docusate sodium (COLACE) 100 mg capsule Take 100 mg by mouth daily.       Allergies   Allergen Reactions   ??? Atorvastatin Myalgia   ??? Zocor [Simvastatin] Other (comments) and Diarrhea     Other reaction(s): Adverse reaction to substance   Myalgias     ??? Celebrex [Celecoxib] Other (comments)     Aggitation   ??? Gabapentin Other (comments)     FELT UNSTEADY ON MY FEET   ??? Levaquin [Levofloxacin] Palpitations   ??? Nexium [Esomeprazole Magnesium] Diarrhea   ??? Other Medication Swelling     Latanoprost drops   ??? Oxycontin [Oxycodone] Itching   ??? Pravastatin Nausea and Vomiting   ??? Statins-Hmg-Coa Reductase Inhibitors Other (comments)     lymphacytic colitis     ??? Yellow Dye Hives       Review of Systems    A comprehensive review of systems was performed and all systems were negative except for HPI and for the symptom report form, reviewed and scanned in.    Objective:  Physical Exam:  Visit Vitals  BP 144/52 (BP 1 Location: Left arm, BP Patient Position: Sitting)   Pulse 77   Temp 96.7 ??F (35.9 ??C) (Temporal)   Resp 16   Ht '5\' 5"'$  (1.651 m)  Wt 147 lb 9.6 oz (67 kg)   LMP 04/13/2010   SpO2 97%   BMI 24.56 kg/m??     General: No distress  Eyes: Anicteric sclerae  HENT: Atraumatic  Neck: Supple  Respiratory: Normal respiratory effort  CV: No peripheral edema  GI: nondistended  Skin: No rashes, ecchymoses, or petechiae  Psych: Alert, oriented, appropriate affect, normal judgment/insight      Diagnostic Imaging     12/16/14 TTE: EF 71%.  03/02/15 TTE: EF 68%  05/30/15 TTE :  EF 65%  07/21/15 TTE: EF 66%  10/06/15 TTE: EF  62%  05/23/16 TTE EF 60%    03/15/16 dexa  Findings:  ????  Fractures identified on Lateral scanogram:  None  ????  Femoral Neck:  Right  Bone mineral density (gm/cm2):? 0.649  % of peak bone mass: 63  % for age matched controls:? 86  T-score: -2.8  Z-score: -0.8  ????  Total Hip: Right  Bone mineral density (gm/cm2):  0.761  % of peak bone mass:   76  % for age matched controls:  98  T-score:   -2.0  Z-score:  -0.1  ??  Lumbar Spine:  L1-L4  Bone mineral density (gm/cm2):  1.211  % of peak bone mass:  101   % for age matched controls:  124  T-score:  0.1  Z-score:  2.0  ??  The T score for the left distal third radius is -1.4.  ????  IMPRESSION  Impression:  ????  This patient is osteoporotic using the World Health Organization criteria  As compared to the prior study, there has been no change in the bone mineral  density of the right total hip and an increase in the bone mineral density of  the lumbar spine of 2.0%.  10 year probability of major osteoporotic fracture:  19.6%  10 year probability of hip fracture:  7.4%    09/20/16 CT c/a/p:  IMPRESSION:  No acute process in the chest, abdomen, and pelvis.  ??    Lab Results  Lab Results   Component Value Date/Time    WBC 5.9 04/17/2018 10:59 AM    HGB 12.0 04/17/2018 10:59 AM    HCT 36.1 04/17/2018 10:59 AM    PLATELET 243 04/17/2018 10:59 AM    MCV 88 04/17/2018 10:59 AM     Lab Results   Component Value Date/Time    Sodium 143 10/15/2018 10:39 AM    Potassium 4.2 10/15/2018 10:39 AM    Chloride 109 (H) 10/15/2018 10:39 AM    CO2 29 10/15/2018 10:39 AM    Anion gap 5 10/15/2018 10:39 AM    Glucose 95 10/15/2018 10:39 AM    BUN 14 10/15/2018 10:39 AM    Creatinine 0.76 10/15/2018 10:39 AM    BUN/Creatinine ratio 18 10/15/2018 10:39 AM    GFR est AA >60 10/15/2018 10:39 AM    GFR est non-AA >60 10/15/2018 10:39 AM    Calcium 8.9 10/15/2018 10:39 AM    AST (SGOT) 18 10/15/2018 10:39 AM    Alk. phosphatase 82 10/15/2018 10:39 AM    Protein, total 6.6 10/15/2018 10:39 AM    Albumin 4.0  10/15/2018 10:39 AM    Globulin 2.6 10/15/2018 10:39 AM    A-G Ratio 1.5 10/15/2018 10:39 AM    ALT (SGPT) 24 10/15/2018 10:39 AM       Lab Results   Component Value Date/Time    Reticulocyte count 1.5 09/05/2016 09:40 AM  Iron % saturation 18 07/22/2017 10:57 AM    TIBC 198 (L) 07/22/2017 10:57 AM    Ferritin 1,447 (H) 07/22/2017 10:57 AM    Vitamin B12 542 07/30/2016 09:31 AM    Folate 14.8 07/30/2016 09:31 AM    Haptoglobin 412 (H) 09/05/2016 09:40 AM    LD 192 09/05/2016 09:40 AM    Sed rate, automated 67 (H) 11/28/2016 05:25 AM    C-Reactive protein 21.70 (H) 11/28/2016 05:25 AM    TSH 0.52 10/15/2018 10:39 AM     Lab Results   Component Value Date/Time    INR 1.0 02/15/2017 03:11 PM    aPTT 28.7 08/18/2009 01:05 PM       Assessment/Plan:  78 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  PS 0    1. Right Breast cancer stage: IA    Hormonal therapy: not indicated due to receptor (-) status    We explained to the patient that the goal of systemic adjuvant therapy is to improve the chances for cure and decrease the risk of relapse. We explained why a patient can have microscopic cancer spread now even though physical examination, laboratory studies and imaging studies are negative for cancer. We explained that the same treatments used now as adjuvant or preventive treatments rarely if ever are curative in women who develop metastases.     No evidence of recurrence    Mammogram in May 2020, per patient, at [facility] breast center, benign, will obtain records.     She declined to take neratinib.    2. Emotional well being: She has excellent support and is coping well with her disease.    3. FH of breast cancer: BRCA 1/2 [place] testing negative    4. Osteoporosis: On dexa from 2015; was on fosamax from Dr. Wanda Plump, started 08/2016, but stopped prior to surgery in 11/2016. DEXA 03/15/16 showed some improvement. Dr. Wanda Plump has recommended Reclast    We discussed  the data from [place], [place] 2013, for the meta-analysis for adjuvant bisphosphonate use in breast cancer.  We discussed that in postmenopausal women, it appears that the bisphosphate zolendronic acid, given as in ABCSG 12 at 4 mg IV q 6 months x 3 years, is beneficial in improving bone DFS and OS.  We discussed side effects such as ONJ and the need to avoid invasive dental procedures while on these medications, renal damage, and hypocalcemia.    After this discussion, she is agreeable to zometa as above.  #1 today, 12/29/2018.    5. Back pain low/radiculopathy: Dr. Dema Severin, pain specialist, was following her. NM bone scan and xr show degeneration. Steroid injection in of Octobert; L-spine MRI 01/19/16 shows DDD.  States last steroid injection 09/24/16.   This has improved.     6. R leg DVT: provoked, in non weight bearing leg s/p surgery. Reports she stopped Eliquis 08/2017.     Thank you for this consult.  All of the patient's questions were answered today.    Follow-up and Dispositions    ?? Return in about 6 months (around 07/01/2019) for labs, Kim/Yousra Ivens, [facility]-zometa #2.Sonda Rumble, MD

## 2018-12-29 NOTE — Progress Notes (Signed)
Jillian Bean is a 78 y.o. female Follow up for the Evaluation of Breast Cancer.     1. Have you been to the ER, urgent care clinic since your last visit?  Hospitalized since your last visit? No    2. Have you seen or consulted any other health care providers outside of the [facility] since your last visit?  Include any pap smears or colon screening. No

## 2018-12-29 NOTE — Progress Notes (Signed)
Outpatient Infusion Center Short Visit Progress Note    Patient admitted to [facility] for first Zometa ambulatory in stable condition. Assessment completed. No new concerns voiced. Reviewed treatment and side effects with patient, no dental work to be done.  Vital Signs:  Visit Vitals  BP 140/63   Pulse 69   Temp 98 F (36.7 C)   Resp 16   LMP 04/13/2010   Breastfeeding No         Left PIV AC with positive blood return.       Medications:  Medications Administered     0.9% sodium chloride infusion     Admin Date  12/29/2018 Action  New Bag Dose  25 mL/hr Rate  25 mL/hr Route  IntraVENous Administered By  Collie Siad          zoledronic acid (ZOMETA) 4 mg/165mL infusion     Admin Date  12/29/2018 Action  Given Dose  4 mg Rate  400 mL/hr Route  IntraVENous Administered By  Collie Siad                Patient tolerated treatment well. Patient discharged from Outpatient Infusion Center ambulatory in no distress. Patient aware next appointment needs to be scheduled for 6 months states she will call and make appointment.    Collie Siad, RN    Future Appointments   Date Time Provider Department Center   03/03/2019  9:30 AM Clarene Critchley, MD [facility] SCHED

## 2018-12-29 NOTE — Progress Notes (Signed)
Outpatient Infusion Center Short Visit Progress Note    Patient admitted to [facility] for first Zometa ambulatory in stable condition. Assessment completed. No new concerns voiced. Reviewed treatment and side effects with patient, no dental work to be done.  Vital Signs:  Visit Vitals  BP 140/63   Pulse 69   Temp 98 ??F (36.7 ??C)   Resp 16   LMP 04/13/2010   Breastfeeding No         Left PIV AC with positive blood return.       Medications:  Medications Administered     0.9% sodium chloride infusion     Admin Date  12/29/2018 Action  New Bag Dose  25 mL/hr Rate  25 mL/hr Route  IntraVENous Administered By  Theda Belfast          zoledronic acid (ZOMETA) 4 mg/131mL infusion     Admin Date  12/29/2018 Action  Given Dose  4 mg Rate  400 mL/hr Route  IntraVENous Administered By  Theda Belfast                Patient tolerated treatment well. Patient discharged from [facility] ambulatory in no distress. Patient aware next appointment needs to be scheduled for 6 months states she will call and make appointment.    Theda Belfast, RN    Future Appointments   Date Time Provider [place]   03/03/2019  9:30 AM [name], MD [place]

## 2018-12-29 NOTE — Progress Notes (Signed)
Jillian Bean is a 78 y.o. female Follow up for the Evaluation of Breast Cancer.     1. Have you been to the ER, urgent care clinic since your last visit?  Hospitalized since your last visit? No    2. Have you seen or consulted any other health care providers outside of the [facility] since your last visit?  Include any pap smears or colon screening. No

## 2018-12-29 NOTE — Progress Notes (Signed)
OUTPATIENT INFUSION CENTER    DISCHARGE INSTRUCTIONS FOR:  ZOMETA (Zoledronic Acid):    1.  Be sure to inform your Dentist that you are taking this drug prior to invasive dental   procedures.  You should avoid major dental work while you are being treated with this     medicine.  Report any signs/symptoms of jaw pain to your physician immediately.    2.  This medicine needs to be taken on a fixed schedule.  If you miss a dose, make sure your doctor is informed.  You will also need to have frequent lab work done; try to keep all of your appointments.   Your doctor may also prescribe Vitamin D and calcium supplements.    3.  Make sure your doctor knows if you are taking fluid pills, arthritis medicines or antibiotics,  or if you have a history of problems with your gums, mouth or teeth.    4. Drink some extra fluids during the 12-hour period before and after you receive Zometa.  Report any changes in urinary patterns (example: a decrease in how often you urinate).  Also report rapid weight gain, swelling of the hands, ankles or feet, unusual tiredness or weakness or unusual bleeding or bruising.    5.  You may resume your normal activities after your infusion.  Some people may have mild side effects from Zometa.  These side effects usually improve after the first infusion.  They may include:  Mild nausea, diarrhea, constipation or upset stomach;  Red or irritated eyes;  redness or itching at IV site;  Mild skin rash, mild numbness, tingling, or burning in extremities;  Headache, dizziness, mild muscle or joint pain, trouble sleeping;  Nasal congestion, runny nose, sneezing    6.   Signs/Symptoms of an allergic reaction may require immediate medical attention.        These are rare, but may include one or more of the following:     Skin - Swelling, blistering, weeping, crusting, rash, itching or;  Wheezing, cough, sore throat, chest tightness, chest pain or shortness of breath, irregular heart beat;   Swelling of the face, eyelids, lips, tongue, or throat; dry mouth;  Severe red (bloodshot), itchy, swollen, watery or painful eyes;  Stomach pain, loss of appetite, nausea, vomiting, or bloody diarrhea;  Severe headache, sleepiness or changes in personality;  Numbness, tingling or pain around the mouth, teeth, or jaw.    Contact your physician if you have questions or concerns, or experience any of the above symptoms.    Ashaya E Skillen, Signature: _______________________________ 12/29/2018  Theda Belfast

## 2018-12-29 NOTE — Progress Notes (Signed)
[facility]  [address], [place]   [address]  W: [phone]   F: [phone]      F/u HEME/ONC CONSULT    Reason for visit: evaluation for treatment for breast cancer    Consulting physician: Dr. Gilford Rile    HPI:   Jillian Bean is a 78 y.o.  female who I was asked to see in consultation at the request of Dr. Elie Confer for evaluation for systemic therapy for breast cancer.    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 Right lumpectomy on 11/10/14 shows IDC, 1.7 cm, gr 3, 0/5 LN, no LVI.  PT1cN0Mx.    Hymera q 3 weeks x 6: 12/21/14- 04/12/15  C#5 held 1 week on 03/15/15 due to thrombocytopenia   Outback Herceptin: 05/03/15-11/29/15    S/p XRT 06/03/15    Bone Marrow Bx 10/09/16: Variably normocellular marrow with trilineage hematopoiesis.    Interval History: In today for follow up. No complaints today.      DX   Encounter Diagnoses   Name Primary?   ??? History of breast cancer Yes   ??? Osteoporosis without current pathological fracture, unspecified osteoporosis type    ??? Acute bilateral low back pain with sciatica, sciatica laterality unspecified      Past Medical History:   Diagnosis Date   ??? Anemia    ??? Back pain 09/01/2007   ??? Cancer (HCC)     R breast   ??? Chronic pain     BACK PAIN   ??? Coagulation disorder ([place])     IRON DEFFICIENCY ANEMIA   ??? Colonic polyps 08/31/1998   ??? 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 DVT (deep venous thrombosis) ([place]) 01/2017   ??? Hiatal hernia 07/11/2011    Dr.sobieski   ??? Hypercholesteremia 09/01/2007   ??? Hypothyroidism 09/01/2003   ??? 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 Nausea & vomiting    ??? OA (osteoarthritis) 09/01/2007   ??? Other ill-defined conditions(799.89)     VERTIGO   ??? Plantar fasciitis 09/01/2007    ??? S/P colonoscopy 10/10/2007   ??? Thromboembolus ([place]) 01/2017    RIGHT LEG   ??? Thyroid disease      Past Surgical History:   Procedure Laterality Date   ??? COLONOSCOPY N/A 11/12/2016    COLONOSCOPY performed by Burnett Harry, MD at [facility]   ??? COLONOSCOPY,DIAGNOSTIC  11/12/2016        ??? ENDOSCOPY, COLON, DIAGNOSTIC  7/12/18/08    7/05 Dr Maudry Diego   ??? HX BREAST LUMPECTOMY Right 2016   ??? HX BREAST REDUCTION  1993   ??? HX CATARACT REMOVAL Right 01/21/2018   ??? HX CATARACT REMOVAL Left 03/11/2018   ??? HX COLONOSCOPY  2014    Dr Illene Silver   ??? HX GI      COLONOSCOPY   ??? HX HEENT  04/07/10    thyroid biopsy neg dr Leward Quan   ??? HX HEENT      wisdom teeth extraction   ??? HX HYSTERECTOMY  1975    PARTIAL   ??? HX KNEE REPLACEMENT Left 08/22/09   ??? HX KNEE REPLACEMENT Right 08/13/12   ??? HX ORTHOPAEDIC Right 11/26/2016  cleaned infection in joint   ??? HX ORTHOPAEDIC Right 03/2017    revision total knee replacement   ??? HX OTHER SURGICAL  2013    MINIMALLY INVASIVE LUMBAR DECOMPRESSION   ??? NEUROLOGICAL PROCEDURE UNLISTED  2013    LUMBAR DECOMPRESSION   ??? UPPER GI ENDOSCOPY,BIOPSY  11/12/2016          Social History     Socioeconomic History   ??? Marital status: MARRIED     Spouse name: Not on file   ??? Number of children: Not on file   ??? Years of education: Not on file   ??? Highest education level: Not on file   Tobacco Use   ??? Smoking status: Never Smoker   ??? Smokeless tobacco: Never Used   Substance and Sexual Activity   ??? Alcohol use: No   ??? Drug use: No   ??? Sexual activity: Not Currently     Family History   Problem Relation Age of Onset   ??? Cancer Mother         Breast   ??? Hypertension Mother    ??? Heart Failure Father         MI   ??? Heart Attack Father    ??? Hypertension Father    ??? MS Brother    ??? Anesth Problems Neg Hx        Current Outpatient Medications   Medication Sig Dispense Refill   ??? colesevelam (WELCHOL) 625 mg tablet TAKE 4 TABLETS DAILY       *GLENMARK* 360 Tab 3    ??? levothyroxine (SYNTHROID) 125 mcg tablet 1 po every day and extra 1/2 tab on sunday 100 Tab 3   ??? naproxen sodium (ALEVE PO) Take 1 Tab by mouth daily as needed.     ??? multivitamin (ONE A DAY) tablet Take 1 Tab by mouth daily.     ??? cholecalciferol, vitamin D3, (VITAMIN D3 PO) Take 1,000 Units by mouth daily.     ??? LOW-DOSE ASPIRIN PO Take 81 mg by mouth daily. Taking 2 tab daily     ??? polyethylene glycol (MIRALAX) 17 gram packet Take 17 g by mouth as needed.     ??? docusate sodium (COLACE) 100 mg capsule Take 100 mg by mouth daily.       Allergies   Allergen Reactions   ??? Atorvastatin Myalgia   ??? Zocor [Simvastatin] Other (comments) and Diarrhea     Other reaction(s): Adverse reaction to substance   Myalgias     ??? Celebrex [Celecoxib] Other (comments)     Aggitation   ??? Gabapentin Other (comments)     FELT UNSTEADY ON MY FEET   ??? Levaquin [Levofloxacin] Palpitations   ??? Nexium [Esomeprazole Magnesium] Diarrhea   ??? Other Medication Swelling     Latanoprost drops   ??? Oxycontin [Oxycodone] Itching   ??? Pravastatin Nausea and Vomiting   ??? Statins-Hmg-Coa Reductase Inhibitors Other (comments)     lymphacytic colitis     ??? Yellow Dye Hives       Review of Systems    A comprehensive review of systems was performed and all systems were negative except for HPI and for the symptom report form, reviewed and scanned in.    Objective:  Physical Exam:  Visit Vitals  BP 144/52 (BP 1 Location: Left arm, BP Patient Position: Sitting)   Pulse 77   Temp 96.7 ??F (35.9 ??C) (Temporal)   Resp 16   Ht '5\' 5"'  (1.651 m)  Wt 147 lb 9.6 oz (67 kg)   LMP 04/13/2010   SpO2 97%   BMI 24.56 kg/m??     General: No distress  Eyes: Anicteric sclerae  HENT: Atraumatic  Neck: Supple  Respiratory: Normal respiratory effort  CV: No peripheral edema  GI: nondistended  Skin: No rashes, ecchymoses, or petechiae  Psych: Alert, oriented, appropriate affect, normal judgment/insight      Diagnostic Imaging     12/16/14 TTE: EF 71%.  03/02/15 TTE: EF 68%   05/30/15 TTE :  EF 65%  07/21/15 TTE: EF 66%  10/06/15 TTE: EF 62%  05/23/16 TTE EF 60%    03/15/16 dexa  Findings:  ????  Fractures identified on Lateral scanogram:  None  ????  Femoral Neck:  Right  Bone mineral density (gm/cm2):? 0.649  % of peak bone mass: 63  % for age matched controls:? 86  T-score: -2.8  Z-score: -0.8  ????  Total Hip: Right  Bone mineral density (gm/cm2):  0.761  % of peak bone mass:   76  % for age matched controls:  98  T-score:   -2.0  Z-score:  -0.1  ??  Lumbar Spine:  L1-L4  Bone mineral density (gm/cm2):  1.211  % of peak bone mass:  101   % for age matched controls:  124  T-score:  0.1  Z-score:  2.0  ??  The T score for the left distal third radius is -1.4.  ????  IMPRESSION  Impression:  ????  This patient is osteoporotic using the World Health Organization criteria  As compared to the prior study, there has been no change in the bone mineral  density of the right total hip and an increase in the bone mineral density of  the lumbar spine of 2.0%.  10 year probability of major osteoporotic fracture:  19.6%  10 year probability of hip fracture:  7.4%    09/20/16 CT c/a/p:  IMPRESSION:  No acute process in the chest, abdomen, and pelvis.  ??    Lab Results  Lab Results   Component Value Date/Time    WBC 5.9 04/17/2018 10:59 AM    HGB 12.0 04/17/2018 10:59 AM    HCT 36.1 04/17/2018 10:59 AM    PLATELET 243 04/17/2018 10:59 AM    MCV 88 04/17/2018 10:59 AM     Lab Results   Component Value Date/Time    Sodium 143 10/15/2018 10:39 AM    Potassium 4.2 10/15/2018 10:39 AM    Chloride 109 (H) 10/15/2018 10:39 AM    CO2 29 10/15/2018 10:39 AM    Anion gap 5 10/15/2018 10:39 AM    Glucose 95 10/15/2018 10:39 AM    BUN 14 10/15/2018 10:39 AM    Creatinine 0.76 10/15/2018 10:39 AM    BUN/Creatinine ratio 18 10/15/2018 10:39 AM    GFR est AA >60 10/15/2018 10:39 AM    GFR est non-AA >60 10/15/2018 10:39 AM    Calcium 8.9 10/15/2018 10:39 AM    AST (SGOT) 18 10/15/2018 10:39 AM     Alk. phosphatase 82 10/15/2018 10:39 AM    Protein, total 6.6 10/15/2018 10:39 AM    Albumin 4.0 10/15/2018 10:39 AM    Globulin 2.6 10/15/2018 10:39 AM    A-G Ratio 1.5 10/15/2018 10:39 AM    ALT (SGPT) 24 10/15/2018 10:39 AM       Lab Results   Component Value Date/Time    Reticulocyte count 1.5 09/05/2016 09:40 AM  Iron % saturation 18 07/22/2017 10:57 AM    TIBC 198 (L) 07/22/2017 10:57 AM    Ferritin 1,447 (H) 07/22/2017 10:57 AM    Vitamin B12 542 07/30/2016 09:31 AM    Folate 14.8 07/30/2016 09:31 AM    Haptoglobin 412 (H) 09/05/2016 09:40 AM    LD 192 09/05/2016 09:40 AM    Sed rate, automated 67 (H) 11/28/2016 05:25 AM    C-Reactive protein 21.70 (H) 11/28/2016 05:25 AM    TSH 0.52 10/15/2018 10:39 AM     Lab Results   Component Value Date/Time    INR 1.0 02/15/2017 03:11 PM    aPTT 28.7 08/18/2009 01:05 PM       Assessment/Plan:  78 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  PS 0    1. Right Breast cancer stage: IA    Hormonal therapy: not indicated due to receptor (-) status    We explained to the patient that the goal of systemic adjuvant therapy is to improve the chances for cure and decrease the risk of relapse. We explained why a patient can have microscopic cancer spread now even though physical examination, laboratory studies and imaging studies are negative for cancer. We explained that the same treatments used now as adjuvant or preventive treatments rarely if ever are curative in women who develop metastases.     No evidence of recurrence    Mammogram in May 2020, per patient, at [facility] breast center, benign, will obtain records.     She declined to take neratinib.    2. Emotional well being: She has excellent support and is coping well with her disease.    3. FH of breast cancer: BRCA 1/2 [place] testing negative    4. Osteoporosis: On dexa from 2015; was on fosamax from Dr. Wanda Plump, started  08/2016, but stopped prior to surgery in 11/2016. DEXA 03/15/16 showed some improvement. Dr. Wanda Plump has recommended Reclast    We discussed the data from [place], [place] 2013, for the meta-analysis for adjuvant bisphosphonate use in breast cancer.  We discussed that in postmenopausal women, it appears that the bisphosphate zolendronic acid, given as in ABCSG 12 at 4 mg IV q 6 months x 3 years, is beneficial in improving bone DFS and OS.  We discussed side effects such as ONJ and the need to avoid invasive dental procedures while on these medications, renal damage, and hypocalcemia.    After this discussion, she is agreeable to zometa as above.  #1 today, 12/29/2018.    5. Back pain low/radiculopathy: Dr. Dema Severin, pain specialist, was following her. NM bone scan and xr show degeneration. Steroid injection in of Octobert; L-spine MRI 01/19/16 shows DDD.  States last steroid injection 09/24/16.   This has improved.     6. R leg DVT: provoked, in non weight bearing leg s/p surgery. Reports she stopped Eliquis 08/2017.     Thank you for this consult.  All of the patient's questions were answered today.    Follow-up and Dispositions    ?? Return in about 6 months (around 07/01/2019) for labs, Kim/Sheba Whaling, [facility]-zometa #2.Sonda Rumble, MD

## 2019-03-03 ENCOUNTER — Inpatient Hospital Stay: Admit: 2019-03-03 | Payer: MEDICARE | Primary: Internal Medicine

## 2019-03-03 ENCOUNTER — Ambulatory Visit: Admit: 2019-03-03 | Discharge: 2019-03-03 | Payer: MEDICARE | Attending: Internal Medicine | Primary: Internal Medicine

## 2019-03-03 ENCOUNTER — Encounter: Primary: Internal Medicine

## 2019-03-03 ENCOUNTER — Ambulatory Visit: Attending: Internal Medicine | Primary: Internal Medicine

## 2019-03-03 DIAGNOSIS — E785 Hyperlipidemia, unspecified: Secondary | ICD-10-CM

## 2019-03-03 LAB — METABOLIC PANEL, COMPREHENSIVE
A-G Ratio: 1.4 (ref 1.1–2.2)
ALT (SGPT): 24 U/L (ref 12–78)
AST (SGOT): 21 U/L (ref 15–37)
Albumin: 4.1 g/dL (ref 3.5–5.0)
Alk. phosphatase: 82 U/L (ref 45–117)
Anion gap: 8 mmol/L (ref 5–15)
BUN/Creatinine ratio: 17 (ref 12–20)
BUN: 13 MG/DL (ref 6–20)
Bilirubin, total: 0.5 MG/DL (ref 0.2–1.0)
CO2: 25 mmol/L (ref 21–32)
Calcium: 8.6 MG/DL (ref 8.5–10.1)
Chloride: 110 mmol/L — ABNORMAL HIGH (ref 97–108)
Creatinine: 0.76 MG/DL (ref 0.55–1.02)
GFR est AA: >60 mL/min/{1.73_m2} (ref >60)
GFR est non-AA: >60 mL/min/{1.73_m2} (ref >60)
Globulin: 3 g/dL (ref 2.0–4.0)
Glucose: 88 mg/dL (ref 65–100)
Potassium: 4 mmol/L (ref 3.5–5.1)
Protein, total: 7.1 g/dL (ref 6.4–8.2)
Sodium: 143 mmol/L (ref 136–145)

## 2019-03-03 LAB — LIPID PANEL
CHOL/HDL Ratio: 3.2 (ref 0.0–5.0)
Chol/HDL Ratio: 3.2 (ref 0.0–5.0)
Cholesterol, Total: 240 MG/DL — ABNORMAL HIGH (ref <200)
Cholesterol, total: 240 MG/DL — ABNORMAL HIGH (ref <200)
HDL Cholesterol: 74 MG/DL
HDL: 74 MG/DL
LDL Calculated: 147.2 MG/DL — ABNORMAL HIGH (ref 0–100)
LDL, calculated: 147.2 MG/DL — ABNORMAL HIGH (ref 0–100)
Triglyceride: 94 MG/DL (ref <150)
Triglycerides: 94 MG/DL (ref <150)
VLDL Cholesterol Calculated: 18.8 MG/DL
VLDL, calculated: 18.8 MG/DL

## 2019-03-03 LAB — TSH 3RD GENERATION
TSH: 1.74 u[IU]/mL (ref 0.36–3.74)
TSH: 1.74 u[IU]/mL (ref 0.36–3.74)

## 2019-03-03 LAB — COMPREHENSIVE METABOLIC PANEL
ALT: 24 U/L (ref 12–78)
AST: 21 U/L (ref 15–37)
Albumin/Globulin Ratio: 1.4 (ref 1.1–2.2)
Albumin: 4.1 g/dL (ref 3.5–5.0)
Alkaline Phosphatase: 82 U/L (ref 45–117)
Anion Gap: 8 mmol/L (ref 5–15)
BUN: 13 MG/DL (ref 6–20)
Bun/Cre Ratio: 17 (ref 12–20)
CO2: 25 mmol/L (ref 21–32)
Calcium: 8.6 MG/DL (ref 8.5–10.1)
Chloride: 110 mmol/L — ABNORMAL HIGH (ref 97–108)
Creatinine: 0.76 MG/DL (ref 0.55–1.02)
EGFR IF NonAfrican American: >60 mL/min/{1.73_m2} (ref >60)
GFR African American: >60 mL/min/{1.73_m2} (ref >60)
Globulin: 3 g/dL (ref 2.0–4.0)
Glucose: 88 mg/dL (ref 65–100)
Potassium: 4 mmol/L (ref 3.5–5.1)
Sodium: 143 mmol/L (ref 136–145)
Total Bilirubin: 0.5 MG/DL (ref 0.2–1.0)
Total Protein: 7.1 g/dL (ref 6.4–8.2)

## 2019-03-03 NOTE — Progress Notes (Signed)
HISTORY OF PRESENT ILLNESS  Jillian Bean is a 78 y.o. female.  HPI  Follow up.  She is doing well.  Since I saw her in March, she did have an infusion of Zometa, tolerated it well.  She is on 2,000 of D.  Higher doses seem to be constipating. Now taking four Welchol a day, tolerating this well.  Will check lipids.  On her thyroid, extra half tab weekly.  No symptoms of hypo or hyperthyroid.      Review of Systems   Constitutional: Negative for chills, fever and weight loss.   Respiratory: Negative for cough, shortness of breath and wheezing.    Cardiovascular: Negative for chest pain, palpitations, orthopnea, leg swelling and PND.   Gastrointestinal: Positive for constipation. Negative for abdominal pain, blood in stool, heartburn and nausea.   Musculoskeletal: Negative for myalgias.   Neurological: Negative for dizziness and headaches.       Physical Exam  Vitals signs and nursing note reviewed.   Constitutional:       Appearance: She is well-developed.   HENT:      Head: Normocephalic and atraumatic.   Neck:      Musculoskeletal: Normal range of motion and neck supple.      Thyroid: No thyromegaly.      Vascular: No carotid bruit.   Cardiovascular:      Rate and Rhythm: Normal rate and regular rhythm.      Heart sounds: Normal heart sounds, S1 normal and S2 normal. No murmur.   Pulmonary:      Effort: Pulmonary effort is normal. No respiratory distress.      Breath sounds: Normal breath sounds. No wheezing or rales.   Musculoskeletal:      Right lower leg: No edema.      Left lower leg: No edema.   Neurological:      Mental Status: She is alert and oriented to person, place, and time.   Psychiatric:         Behavior: Behavior normal.         ASSESSMENT and PLAN  Diagnoses and all orders for this visit:    1. Dyslipidemia, goal LDL below 100-cont welchol 4 per day  -     METABOLIC PANEL, COMPREHENSIVE; Future  -     LIPID PANEL; Future    2. Hypothyroidism due to acquired atrophy of thyroid  -     TSH 3RD  GENERATION; Future    3. Osteoporosis of femur without pathological fracture    appt in 53mo

## 2019-03-03 NOTE — Progress Notes (Signed)
Message sent about labs

## 2019-03-03 NOTE — Progress Notes (Signed)
Room:     Identified pt with two pt identifiers(name and DOB). Reviewed record in preparation for visit and have obtained necessary documentation. All patient medications has been reviewed.    No chief complaint on file.      Health Maintenance Due   Topic   . GLAUCOMA SCREENING Q2Y        There were no vitals filed for this visit.    1.Have you traveled outside of the [name] in the last month No    2. Have you been in close contact with someone who is suspected to have COVID-19 or has tested positive. No    3. Do you have any signs or symptoms.?    4.Have you been to the ER, urgent care clinic since your last visit?  Hospitalized since your last visit?No    5. Have you seen or consulted any other health care providers outside of the [facility] since your last visit?  Include any pap smears or colon screening. No      7. Are you fasting for blood work today?YES    8. Do you have an Advanced Directive/ Living Will in place? YES  If yes, do we have a copy on file YES  If no, would you like information NO    Patient is accompanied by self I have received verbal consent from Jillian Bean to discuss any/all medical information while they are present in the room.

## 2019-03-03 NOTE — Progress Notes (Signed)
Room:     Identified pt with two pt identifiers(name and DOB). Reviewed record in preparation for visit and have obtained necessary documentation. All patient medications has been reviewed.    No chief complaint on file.      Health Maintenance Due   Topic   ??? GLAUCOMA SCREENING Q2Y        There were no vitals filed for this visit.    1.Have you traveled outside of the [name] in the last month No    2. Have you been in close contact with someone who is suspected to have COVID-19 or has tested positive. No    3. Do you have any signs or symptoms.?    4.Have you been to the ER, urgent care clinic since your last visit?  Hospitalized since your last visit?No    5. Have you seen or consulted any other health care providers outside of the [place] since your last visit?  Include any pap smears or colon screening. No      7. Are you fasting for blood work today?YES    8. Do you have an Advanced Directive/ Living Will in place? YES  If yes, do we have a copy on file YES  If no, would you like information NO    Patient is accompanied by self I have received verbal consent from Jillian Bean to discuss any/all medical information while they are present in the room.

## 2019-03-03 NOTE — Progress Notes (Signed)
HISTORY OF PRESENT ILLNESS  Jillian Bean is a 78 y.o. female.  HPI  Follow up.  She is doing well.  Since I saw her in March, she did have an infusion of Zometa, tolerated it well.  She is on 2,000 of D.  Higher doses seem to be constipating. Now taking four Welchol a day, tolerating this well.  Will check lipids.  On her thyroid, extra half tab weekly.  No symptoms of hypo or hyperthyroid.      Review of Systems   Constitutional: Negative for chills, fever and weight loss.   Respiratory: Negative for cough, shortness of breath and wheezing.    Cardiovascular: Negative for chest pain, palpitations, orthopnea, leg swelling and PND.   Gastrointestinal: Positive for constipation. Negative for abdominal pain, blood in stool, heartburn and nausea.   Musculoskeletal: Negative for myalgias.   Neurological: Negative for dizziness and headaches.       Physical Exam  Vitals signs and nursing note reviewed.   Constitutional:       Appearance: She is well-developed.   HENT:      Head: Normocephalic and atraumatic.   Neck:      Musculoskeletal: Normal range of motion and neck supple.      Thyroid: No thyromegaly.      Vascular: No carotid bruit.   Cardiovascular:      Rate and Rhythm: Normal rate and regular rhythm.      Heart sounds: Normal heart sounds, S1 normal and S2 normal. No murmur.   Pulmonary:      Effort: Pulmonary effort is normal. No respiratory distress.      Breath sounds: Normal breath sounds. No wheezing or rales.   Musculoskeletal:      Right lower leg: No edema.      Left lower leg: No edema.   Neurological:      Mental Status: She is alert and oriented to person, place, and time.   Psychiatric:         Behavior: Behavior normal.         ASSESSMENT and PLAN  Diagnoses and all orders for this visit:    1. Dyslipidemia, goal LDL below 100-cont welchol 4 per day  -     METABOLIC PANEL, COMPREHENSIVE; Future  -     LIPID PANEL; Future    2. Hypothyroidism due to acquired atrophy of thyroid   -     TSH 3RD GENERATION; Future    3. Osteoporosis of femur without pathological fracture    appt in 27mo

## 2019-04-13 NOTE — Telephone Encounter (Signed)
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 Thx.

## 2019-05-11 NOTE — Telephone Encounter (Signed)
From: Duane Lope  To: [name], MD  Sent: 05/09/2019 12:44 PM EDT  Subject: Update Medical Information    Please update records to indicate that I have had glaucoma surgery at the same time as cataract surgery on 01/21/2018 and 03/11/2018, right eye and then left eye by Dr. Cristopher Estimable. [place], VEI.    On 05/05/2019 received FLUAD QUAD 2020-2021 SYR Vaccine at [facility].

## 2019-05-11 NOTE — Telephone Encounter (Signed)
From: Jillian Bean  To: [name], MD  Sent: 05/09/2019 12:55 PM EDT  Subject: Update Medical Information    On Dec 24, 2018, I had my mammogram at [facility] and Dr. Mcarthur Rossetti said everything looked okay. January 14, 2019, I was notified that Dr. Mcarthur Rossetti is considered a Non-participating provider for [place]. On March 02, 2019, [employer] notified me that the [place] [employer] suspended Dr. Rosario Adie license in connection  with patient mammograms. Dr. Derald Macleod Deal referred me to [place] at [facility] and I had a repeat mammogram on September 19th. The results were normal/benign.

## 2019-06-09 MED ORDER — LEVOTHYROXINE 125 MCG TAB
125 mcg | ORAL_TABLET | ORAL | 3 refills | Status: DC
Start: 2019-06-09 — End: 2020-05-31

## 2019-06-10 ENCOUNTER — Encounter: Primary: Internal Medicine

## 2019-06-29 ENCOUNTER — Ambulatory Visit: Admit: 2019-06-29 | Discharge: 2019-06-29 | Payer: MEDICARE | Attending: Nurse Practitioner | Primary: Internal Medicine

## 2019-06-29 ENCOUNTER — Inpatient Hospital Stay: Admit: 2019-06-29 | Payer: MEDICARE | Primary: Internal Medicine

## 2019-06-29 ENCOUNTER — Ambulatory Visit: Attending: Nurse Practitioner | Primary: Internal Medicine

## 2019-06-29 DIAGNOSIS — Z853 Personal history of malignant neoplasm of breast: Secondary | ICD-10-CM

## 2019-06-29 DIAGNOSIS — C50911 Malignant neoplasm of unspecified site of right female breast: Secondary | ICD-10-CM

## 2019-06-29 LAB — POC CHEM8
ANION GAP ISTAT,IAGAP: 14 mmol/L (ref 10–20)
Anion gap (POC): 14 mmol/L (ref 10–20)
BUN (POC): 15 mg/dL (ref 9–20)
BUN ISTAT,IBUN: 15 mg/dL (ref 9–20)
CHLORIDE ISTAT,ICL: 105 mmol/L (ref 98–107)
CO2 (POC): 28 mmol/L (ref 21–32)
Calcium, ionized (POC): 1.18 mmol/L (ref 1.12–1.32)
Calcium, ionized, POC: 1.18 mmol/L (ref 1.12–1.32)
Chloride (POC): 105 mmol/L (ref 98–107)
Creatinine (POC): 0.9 mg/dL (ref 0.6–1.3)
GFR African American: >60 mL/min/{1.73_m2} (ref >60)
GFR Non-African American: >60 mL/min/{1.73_m2} (ref >60)
GFRAA, POC: >60 mL/min/{1.73_m2} (ref >60)
GFRNA, POC: >60 mL/min/{1.73_m2} (ref >60)
GLUCOSE ISTAT,IGLU: 61 mg/dL — ABNORMAL LOW (ref 65–100)
Glucose (POC): 61 mg/dL — ABNORMAL LOW (ref 65–100)
HEMATOCRIT ISTAT,IHCT: 37 % (ref 35.0–47.0)
Hematocrit (POC): 37 % (ref 35.0–47.0)
POC Creatinine: 0.9 mg/dL (ref 0.6–1.3)
POTASSIUM ISTAT,IK: 4.1 mmol/L (ref 3.5–5.1)
Potassium (POC): 4.1 mmol/L (ref 3.5–5.1)
SODIUM ISTAT,INA: 142 mmol/L (ref 136–145)
Sodium (POC): 142 mmol/L (ref 136–145)
TCO2 ISTAT,ITCO2: 28 mmol/L (ref 21–32)

## 2019-06-29 MED ORDER — SODIUM CHLORIDE 0.9 % IV
45 mg/5 mL | Freq: Once | INTRAVENOUS | Status: AC
Start: 2019-06-29 — End: 2019-06-29
  Administered 2019-06-29: 16:00:00 via INTRAVENOUS

## 2019-06-29 MED ORDER — SODIUM CHLORIDE 0.9 % IV
INTRAVENOUS | Status: AC
Start: 2019-06-29 — End: 2019-06-29
  Administered 2019-06-29: 16:00:00 via INTRAVENOUS

## 2019-06-29 MED FILL — SODIUM CHLORIDE 0.9 % IV: INTRAVENOUS | Qty: 1000

## 2019-06-29 MED FILL — ZOLEDRONIC ACID 4 MG/5 ML IV: 4 mg/5 mL | INTRAVENOUS | Qty: 4.38

## 2019-06-29 NOTE — Progress Notes (Signed)
Jillian Bean is a 78 y.o. female Follow up for the Evaluation of Breast Cancer.     1. Have you been to the ER, urgent care clinic since your last visit?  Hospitalized since your last visit? No    2. Have you seen or consulted any other health care providers outside of the [facility] since your last visit?  Include any pap smears or colon screening. No

## 2019-06-29 NOTE — Progress Notes (Signed)
[facility]  [address] [place]   [address]  W: [phone]   F: [phone]      F/u HEME/ONC CONSULT    Reason for visit: evaluation for treatment for breast cancer    Consulting physician: Dr. Gilford Rile    HPI:   Jillian Bean is a 78 y.o.  female who I was asked to see in consultation at the request of Dr. Elie Confer for evaluation for systemic therapy for breast cancer.    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 Right lumpectomy on 11/10/14 shows IDC, 1.7 cm, gr 3, 0/5 LN, no LVI.  PT1cN0Mx.    [name] q 3 weeks x 6: 12/21/14- 04/12/15  C#5 held 1 week on 03/15/15 due to thrombocytopenia   Outback Herceptin: 05/03/15-11/29/15    S/p XRT 06/03/15    Bone Marrow Bx 10/09/16: Variably normocellular marrow with trilineage hematopoiesis.    Interval History: In today for follow up. Experiencing gr 1 constipation, improved with stool softner     DX   Encounter Diagnosis   Name Primary?   ??? History of breast cancer Yes     Past Medical History:   Diagnosis Date   ??? Anemia    ??? Back pain 09/01/2007   ??? Cancer (HCC)     R breast   ??? Chronic pain     BACK PAIN   ??? Coagulation disorder ([place])     IRON DEFFICIENCY ANEMIA   ??? Colonic polyps 08/31/1998   ??? 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 DVT (deep venous thrombosis) ([place]) 01/2017   ??? Hiatal hernia 07/11/2011    Dr.sobieski   ??? Hypercholesteremia 09/01/2007   ??? Hypothyroidism 09/01/2003   ??? 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 Nausea & vomiting    ??? OA (osteoarthritis) 09/01/2007   ??? Other ill-defined conditions(799.89)     VERTIGO   ??? Plantar fasciitis 09/01/2007   ??? S/P colonoscopy 10/10/2007   ??? Thromboembolus ([place]) 01/2017    RIGHT LEG   ??? Thyroid disease      Past Surgical History:   Procedure Laterality  Date   ??? COLONOSCOPY N/A 11/12/2016    COLONOSCOPY performed by Burnett Harry, MD at [facility]   ??? COLONOSCOPY,DIAGNOSTIC  11/12/2016        ??? ENDOSCOPY, COLON, DIAGNOSTIC  7/12/18/08    7/05 Dr Maudry Diego   ??? HX BREAST LUMPECTOMY Right 2016   ??? HX BREAST REDUCTION  1993   ??? HX CATARACT REMOVAL Right 01/21/2018   ??? HX CATARACT REMOVAL Left 03/11/2018   ??? HX COLONOSCOPY  2014    Dr Illene Silver   ??? HX GI      COLONOSCOPY   ??? HX HEENT  04/07/10    thyroid biopsy neg dr Leward Quan   ??? HX HEENT      wisdom teeth extraction   ??? HX HYSTERECTOMY  1975    PARTIAL   ??? HX KNEE REPLACEMENT Left 08/22/09   ??? HX KNEE REPLACEMENT Right 08/13/12   ??? HX ORTHOPAEDIC Right 11/26/2016    cleaned infection in joint   ??? HX ORTHOPAEDIC Right 03/2017    revision total knee replacement   ???  HX OTHER SURGICAL  2013    MINIMALLY INVASIVE LUMBAR DECOMPRESSION   ??? NEUROLOGICAL PROCEDURE UNLISTED  2013    LUMBAR DECOMPRESSION   ??? UPPER GI ENDOSCOPY,BIOPSY  11/12/2016          Social History     Socioeconomic History   ??? Marital status: MARRIED     Spouse name: Not on file   ??? Number of children: Not on file   ??? Years of education: Not on file   ??? Highest education level: Not on file   Tobacco Use   ??? Smoking status: Never Smoker   ??? Smokeless tobacco: Never Used   Substance and Sexual Activity   ??? Alcohol use: No   ??? Drug use: No   ??? Sexual activity: Not Currently     Family History   Problem Relation Age of Onset   ??? Cancer Mother         Breast   ??? Hypertension Mother    ??? Heart Failure Father         MI   ??? Heart Attack Father    ??? Hypertension Father    ??? MS Brother    ??? Anesth Problems Neg Hx        Current Outpatient Medications   Medication Sig Dispense Refill   ??? BIOTIN PO Take  by mouth.     ??? levothyroxine (SYNTHROID) 125 mcg tablet Take 1 Tab by mouth six (6) days a week. Take 1.5 tabs by mouth on Sunday only 100 Tab 3   ??? colesevelam (WELCHOL) 625 mg tablet TAKE 4 TABLETS DAILY       *GLENMARK* 360 Tab 3   ??? naproxen sodium (ALEVE PO) Take 1 Tab  by mouth daily as needed.     ??? multivitamin (ONE A DAY) tablet Take 1 Tab by mouth daily.     ??? cholecalciferol, vitamin D3, (VITAMIN D3 PO) Take 2,000 Units by mouth daily.     ??? LOW-DOSE ASPIRIN PO Take 162 mg by mouth daily. Taking 2 tab daily     ??? polyethylene glycol (MIRALAX) 17 gram packet Take 17 g by mouth as needed.     ??? docusate sodium (COLACE) 100 mg capsule Take 100 mg by mouth daily.       Allergies   Allergen Reactions   ??? Atorvastatin Myalgia   ??? Zocor [Simvastatin] Other (comments) and Diarrhea     Other reaction(s): Adverse reaction to substance   Myalgias     ??? Celebrex [Celecoxib] Other (comments)     Aggitation   ??? Gabapentin Other (comments)     FELT UNSTEADY ON MY FEET   ??? Levaquin [Levofloxacin] Palpitations   ??? Nexium [Esomeprazole Magnesium] Diarrhea   ??? Other Medication Swelling     Latanoprost drops   ??? Oxycontin [Oxycodone] Itching   ??? Pravastatin Nausea and Vomiting   ??? Statins-Hmg-Coa Reductase Inhibitors Other (comments)     lymphacytic colitis     ??? Yellow Dye Hives       Review of Systems    A comprehensive review of systems was performed and all systems were negative except for HPI and for the symptom report form, reviewed and scanned in.    Objective:  Physical Exam:  Visit Vitals  BP 113/64 (BP 1 Location: Left arm, BP Patient Position: Sitting)   Pulse 77   Temp 97.5 ??F (36.4 ??C) (Temporal)   Resp 18   Wt 147 lb (66.7 kg)  LMP 04/13/2010   SpO2 96%   BMI 24.46 kg/m??     General: No distress  Eyes: Anicteric sclerae  HENT: Atraumatic  Neck: Supple  Respiratory: Normal respiratory effort  CV: No peripheral edema  GI: nondistended  Skin: No rashes, ecchymoses, or petechiae  Psych: Alert, oriented, appropriate affect, normal judgment/insight      Diagnostic Imaging     12/16/14 TTE: EF 71%.  03/02/15 TTE: EF 68%  05/30/15 TTE :  EF 65%  07/21/15 TTE: EF 66%  10/06/15 TTE: EF 62%  05/23/16 TTE EF 60%    03/15/16 dexa  Findings:  ????  Fractures identified on Lateral scanogram:   None  ????  Femoral Neck:  Right  Bone mineral density (gm/cm2):? 0.649  % of peak bone mass: 63  % for age matched controls:? 86  T-score: -2.8  Z-score: -0.8  ????  Total Hip: Right  Bone mineral density (gm/cm2):  0.761  % of peak bone mass:   76  % for age matched controls:  98  T-score:   -2.0  Z-score:  -0.1  ??  Lumbar Spine:  L1-L4  Bone mineral density (gm/cm2):  1.211  % of peak bone mass:  101   % for age matched controls:  124  T-score:  0.1  Z-score:  2.0  ??  The T score for the left distal third radius is -1.4.  ????  IMPRESSION  Impression:  ????  This patient is osteoporotic using the World Health Organization criteria  As compared to the prior study, there has been no change in the bone mineral  density of the right total hip and an increase in the bone mineral density of  the lumbar spine of 2.0%.  10 year probability of major osteoporotic fracture:  19.6%  10 year probability of hip fracture:  7.4%    09/20/16 CT c/a/p:  IMPRESSION:  No acute process in the chest, abdomen, and pelvis.  ??    Lab Results  Lab Results   Component Value Date/Time    WBC 5.9 04/17/2018 10:59 AM    HGB 12.0 04/17/2018 10:59 AM    HCT 36.1 04/17/2018 10:59 AM    PLATELET 243 04/17/2018 10:59 AM    MCV 88 04/17/2018 10:59 AM     Lab Results   Component Value Date/Time    Sodium 143 03/03/2019 10:10 AM    Potassium 4.0 03/03/2019 10:10 AM    Chloride 110 (H) 03/03/2019 10:10 AM    CO2 25 03/03/2019 10:10 AM    Anion gap 8 03/03/2019 10:10 AM    Glucose 88 03/03/2019 10:10 AM    BUN 13 03/03/2019 10:10 AM    Creatinine 0.76 03/03/2019 10:10 AM    BUN/Creatinine ratio 17 03/03/2019 10:10 AM    GFR est AA >60 03/03/2019 10:10 AM    GFR est non-AA >60 03/03/2019 10:10 AM    Calcium 8.6 03/03/2019 10:10 AM    Alk. phosphatase 82 03/03/2019 10:10 AM    Protein, total 7.1 03/03/2019 10:10 AM    Albumin 4.1 03/03/2019 10:10 AM    Globulin 3.0 03/03/2019 10:10 AM    A-G Ratio 1.4 03/03/2019 10:10 AM    ALT (SGPT) 24 03/03/2019 10:10 AM        Lab Results   Component Value Date/Time    Reticulocyte count 1.5 09/05/2016 09:40 AM    Iron % saturation 18 07/22/2017 10:57 AM    TIBC 198 (L) 07/22/2017 10:57 AM  Ferritin 1,447 (H) 07/22/2017 10:57 AM    Vitamin B12 542 07/30/2016 09:31 AM    Folate 14.8 07/30/2016 09:31 AM    Haptoglobin 412 (H) 09/05/2016 09:40 AM    LD 192 09/05/2016 09:40 AM    Sed rate, automated 67 (H) 11/28/2016 05:25 AM    C-Reactive protein 21.70 (H) 11/28/2016 05:25 AM    TSH 1.74 03/03/2019 10:10 AM     Lab Results   Component Value Date/Time    INR 1.0 02/15/2017 03:11 PM    aPTT 28.7 08/18/2009 01:05 PM       Assessment/Plan:  78 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  PS 0    1. Right Breast cancer stage: IA    Hormonal therapy: not indicated due to receptor (-) status    We explained to the patient that the goal of systemic adjuvant therapy is to improve the chances for cure and decrease the risk of relapse. We explained why a patient can have microscopic cancer spread now even though physical examination, laboratory studies and imaging studies are negative for cancer. We explained that the same treatments used now as adjuvant or preventive treatments rarely if ever are curative in women who develop metastases.     No evidence of recurrence    Mammogram in Sept 17, 2020, benign    She declined to take neratinib.    2. Emotional well being: She has excellent support and is coping well with her disease.    3. FH of breast cancer: BRCA 1/2 [place] testing negative    4. Osteoporosis: On dexa from 2015; was on fosamax from Dr. Wanda Plump, started 08/2016, but stopped prior to surgery in 11/2016. DEXA 03/15/16 showed some improvement. Dr. Wanda Plump has recommended Reclast    We discussed the data from [place], [place] 2013, for the meta-analysis for adjuvant bisphosphonate use in breast cancer.  We discussed that in postmenopausal women, it appears that the bisphosphate  zolendronic acid, given as in ABCSG 12 at 4 mg IV q 6 months x 3 years, is beneficial in improving bone DFS and OS.  We discussed side effects such as ONJ and the need to avoid invasive dental procedures while on these medications, renal damage, and hypocalcemia.    After this discussion, she is agreeable to zometa as above.  #1, 12/29/2018, #2 06/29/19    5. Back pain low/radiculopathy: Dr. Dema Severin, pain specialist, was following her. NM bone scan and xr show degeneration. Steroid injection in of Octobert; L-spine MRI 01/19/16 shows DDD.  States last steroid injection 09/24/16.   This has improved.     6. R leg DVT: provoked, in non weight bearing leg s/p surgery. Reports she stopped Eliquis 08/2017.     7. Constipation:  Ok on stool softners    Thank you for this consult.  All of the patient's questions were answered today.        Sonda Rumble, MD

## 2019-06-29 NOTE — Progress Notes (Signed)
 [facility] Progress Note    Pt admit to [facility] for Zometa ambulatory in stable condition. Assessment completed. No new concerns voiced.    Peripheral IV 24 g established in left AC with positive blood return. Labs obtained and sent for processing. I-stat performed. Results are within parameters to treat.    Patient states she did not have any invasive dental procedures within the last 6-8 weeks nor none scheduled.     Visit Vitals  BP 115/63 (BP 1 Location: Left arm, BP Patient Position: Sitting)   Pulse 68   Temp 97.5 F (36.4 C)   Resp 18   Wt 67 kg (147 lb 12.8 oz)   LMP 04/13/2010   SpO2 98%   Breastfeeding No   BMI 24.60 kg/m     Patient Vitals for the past 12 hrs:   Temp Pulse Resp BP SpO2   06/29/19 1126 -- 68 18 115/63 98 %   06/29/19 0913 97.5 F (36.4 C) 77 18 113/64 96 %     Recent Results (from the past 12 hour(s))   POC CHEM8    Collection Time: 06/29/19  9:23 AM   Result Value Ref Range    Calcium, ionized (POC) 1.18 1.12 - 1.32 mmol/L    Sodium (POC) 142 136 - 145 mmol/L    Potassium (POC) 4.1 3.5 - 5.1 mmol/L    Chloride (POC) 105 98 - 107 mmol/L    CO2 (POC) 28 21 - 32 mmol/L    Anion gap (POC) 14 10 - 20 mmol/L    Glucose (POC) 61 (L) 65 - 100 mg/dL    BUN (POC) 15 9 - 20 mg/dL    Creatinine (POC) 0.9 0.6 - 1.3 mg/dL    GFRAA, POC >39 >39 fo/fpw/8.26f7    GFRNA, POC >60 >60 ml/min/1.41m2    Hematocrit (POC) 37 35.0 - 47.0 %    Comment Comment Not Indicated.       Medications:  Medications Administered     0.9% sodium chloride infusion     Admin Date  06/29/2019 Action  New Bag Dose  25 mL/hr Rate  25 mL/hr Route  IntraVENous Administered By  Lavan, Lisa, RN          zoledronic acid (ZOMETA) 3.5 mg in 0.9% sodium chloride 100 mL IVPB     Admin Date  06/29/2019 Action  Given Dose  3.5 mg Route  IntraVENous Administered By  Lavan, Lisa, RN              Pt tolerated treatment well.PIV taken out with no issues, pressure applied, wrapped in 2x2 gauze and coban. D/c home ambulatory in no  distress. Pt aware of next appointment scheduled for     Future Appointments   Date Time Provider Department Center   09/08/2019  9:00 AM Rowland Dorien CROME, MD [facility] AMB   12/28/2019  9:00 AM SS INF7 CH2 <1H [facility]   12/28/2019  9:15 AM Celena Elsie PARAS, MD [facility] AM[facility]   06/27/2020  9:00 AM SS INF7 CH2 <1H [facility]

## 2019-06-29 NOTE — Progress Notes (Signed)
Jillian Bean is a 78 y.o. female Follow up for the Evaluation of Breast Cancer.     1. Have you been to the ER, urgent care clinic since your last visit?  Hospitalized since your last visit? No    2. Have you seen or consulted any other health care providers outside of the [facility] since your last visit?  Include any pap smears or colon screening. No

## 2019-06-29 NOTE — Progress Notes (Signed)
[facility]  [address] [place]   [address]  W: [phone]   F: [phone]      F/u HEME/ONC CONSULT    Reason for visit: evaluation for treatment for breast cancer    Consulting physician: Dr. Gilford Rile    HPI:   Jillian Bean is a 78 y.o.  female who I was asked to see in consultation at the request of Dr. Elie Confer for evaluation for systemic therapy for breast cancer.    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 Right lumpectomy on 11/10/14 shows IDC, 1.7 cm, gr 3, 0/5 LN, no LVI.  PT1cN0Mx.    [place] q 3 weeks x 6: 12/21/14- 04/12/15  C#5 held 1 week on 03/15/15 due to thrombocytopenia   Outback Herceptin: 05/03/15-11/29/15    S/p XRT 06/03/15    Bone Marrow Bx 10/09/16: Variably normocellular marrow with trilineage hematopoiesis.    Interval History: In today for follow up. Experiencing gr 1 constipation, improved with stool softner     DX   Encounter Diagnosis   Name Primary?   ??? History of breast cancer Yes     Past Medical History:   Diagnosis Date   ??? Anemia    ??? Back pain 09/01/2007   ??? Cancer (HCC)     R breast   ??? Chronic pain     BACK PAIN   ??? Coagulation disorder ([place])     IRON DEFFICIENCY ANEMIA   ??? Colonic polyps 08/31/1998   ??? 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 DVT (deep venous thrombosis) ([place]) 01/2017   ??? Hiatal hernia 07/11/2011    Dr.sobieski   ??? Hypercholesteremia 09/01/2007   ??? Hypothyroidism 09/01/2003   ??? 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 Nausea & vomiting    ??? OA (osteoarthritis) 09/01/2007   ??? Other ill-defined conditions(799.89)     VERTIGO   ??? Plantar fasciitis 09/01/2007   ??? S/P colonoscopy 10/10/2007   ??? Thromboembolus ([place]) 01/2017    RIGHT LEG   ??? Thyroid disease      Past Surgical History:   Procedure Laterality  Date   ??? COLONOSCOPY N/A 11/12/2016    COLONOSCOPY performed by Burnett Harry, MD at [facility]   ??? COLONOSCOPY,DIAGNOSTIC  11/12/2016        ??? ENDOSCOPY, COLON, DIAGNOSTIC  7/12/18/08    7/05 Dr Maudry Diego   ??? HX BREAST LUMPECTOMY Right 2016   ??? HX BREAST REDUCTION  1993   ??? HX CATARACT REMOVAL Right 01/21/2018   ??? HX CATARACT REMOVAL Left 03/11/2018   ??? HX COLONOSCOPY  2014    Dr Illene Silver   ??? HX GI      COLONOSCOPY   ??? HX HEENT  04/07/10    thyroid biopsy neg dr Leward Quan   ??? HX HEENT      wisdom teeth extraction   ??? HX HYSTERECTOMY  1975    PARTIAL   ??? HX KNEE REPLACEMENT Left 08/22/09   ??? HX KNEE REPLACEMENT Right 08/13/12   ??? HX ORTHOPAEDIC Right 11/26/2016    cleaned infection in joint   ??? HX ORTHOPAEDIC Right 03/2017    revision total knee replacement   ???  HX OTHER SURGICAL  2013    MINIMALLY INVASIVE LUMBAR DECOMPRESSION   ??? NEUROLOGICAL PROCEDURE UNLISTED  2013    LUMBAR DECOMPRESSION   ??? UPPER GI ENDOSCOPY,BIOPSY  11/12/2016          Social History     Socioeconomic History   ??? Marital status: MARRIED     Spouse name: Not on file   ??? Number of children: Not on file   ??? Years of education: Not on file   ??? Highest education level: Not on file   Tobacco Use   ??? Smoking status: Never Smoker   ??? Smokeless tobacco: Never Used   Substance and Sexual Activity   ??? Alcohol use: No   ??? Drug use: No   ??? Sexual activity: Not Currently     Family History   Problem Relation Age of Onset   ??? Cancer Mother         Breast   ??? Hypertension Mother    ??? Heart Failure Father         MI   ??? Heart Attack Father    ??? Hypertension Father    ??? MS Brother    ??? Anesth Problems Neg Hx        Current Outpatient Medications   Medication Sig Dispense Refill   ??? BIOTIN PO Take  by mouth.     ??? levothyroxine (SYNTHROID) 125 mcg tablet Take 1 Tab by mouth six (6) days a week. Take 1.5 tabs by mouth on Sunday only 100 Tab 3   ??? colesevelam (WELCHOL) 625 mg tablet TAKE 4 TABLETS DAILY       *GLENMARK* 360 Tab 3   ??? naproxen sodium (ALEVE PO) Take 1 Tab  by mouth daily as needed.     ??? multivitamin (ONE A DAY) tablet Take 1 Tab by mouth daily.     ??? cholecalciferol, vitamin D3, (VITAMIN D3 PO) Take 2,000 Units by mouth daily.     ??? LOW-DOSE ASPIRIN PO Take 162 mg by mouth daily. Taking 2 tab daily     ??? polyethylene glycol (MIRALAX) 17 gram packet Take 17 g by mouth as needed.     ??? docusate sodium (COLACE) 100 mg capsule Take 100 mg by mouth daily.       Allergies   Allergen Reactions   ??? Atorvastatin Myalgia   ??? Zocor [Simvastatin] Other (comments) and Diarrhea     Other reaction(s): Adverse reaction to substance   Myalgias     ??? Celebrex [Celecoxib] Other (comments)     Aggitation   ??? Gabapentin Other (comments)     FELT UNSTEADY ON MY FEET   ??? Levaquin [Levofloxacin] Palpitations   ??? Nexium [Esomeprazole Magnesium] Diarrhea   ??? Other Medication Swelling     Latanoprost drops   ??? Oxycontin [Oxycodone] Itching   ??? Pravastatin Nausea and Vomiting   ??? Statins-Hmg-Coa Reductase Inhibitors Other (comments)     lymphacytic colitis     ??? Yellow Dye Hives       Review of Systems    A comprehensive review of systems was performed and all systems were negative except for HPI and for the symptom report form, reviewed and scanned in.    Objective:  Physical Exam:  Visit Vitals  BP 113/64 (BP 1 Location: Left arm, BP Patient Position: Sitting)   Pulse 77   Temp 97.5 ??F (36.4 ??C) (Temporal)   Resp 18   Wt 147 lb (66.7 kg)  LMP 04/13/2010   SpO2 96%   BMI 24.46 kg/m??     General: No distress  Eyes: Anicteric sclerae  HENT: Atraumatic  Neck: Supple  Respiratory: Normal respiratory effort  CV: No peripheral edema  GI: nondistended  Skin: No rashes, ecchymoses, or petechiae  Psych: Alert, oriented, appropriate affect, normal judgment/insight      Diagnostic Imaging     12/16/14 TTE: EF 71%.  03/02/15 TTE: EF 68%  05/30/15 TTE :  EF 65%  07/21/15 TTE: EF 66%  10/06/15 TTE: EF 62%  05/23/16 TTE EF 60%    03/15/16 dexa  Findings:  ????  Fractures identified on Lateral scanogram:  None  ????   Femoral Neck:  Right  Bone mineral density (gm/cm2):? 0.649  % of peak bone mass: 63  % for age matched controls:? 86  T-score: -2.8  Z-score: -0.8  ????  Total Hip: Right  Bone mineral density (gm/cm2):  0.761  % of peak bone mass:   76  % for age matched controls:  98  T-score:   -2.0  Z-score:  -0.1  ??  Lumbar Spine:  L1-L4  Bone mineral density (gm/cm2):  1.211  % of peak bone mass:  101   % for age matched controls:  124  T-score:  0.1  Z-score:  2.0  ??  The T score for the left distal third radius is -1.4.  ????  IMPRESSION  Impression:  ????  This patient is osteoporotic using the World Health Organization criteria  As compared to the prior study, there has been no change in the bone mineral  density of the right total hip and an increase in the bone mineral density of  the lumbar spine of 2.0%.  10 year probability of major osteoporotic fracture:  19.6%  10 year probability of hip fracture:  7.4%    09/20/16 CT c/a/p:  IMPRESSION:  No acute process in the chest, abdomen, and pelvis.  ??    Lab Results  Lab Results   Component Value Date/Time    WBC 5.9 04/17/2018 10:59 AM    HGB 12.0 04/17/2018 10:59 AM    HCT 36.1 04/17/2018 10:59 AM    PLATELET 243 04/17/2018 10:59 AM    MCV 88 04/17/2018 10:59 AM     Lab Results   Component Value Date/Time    Sodium 143 03/03/2019 10:10 AM    Potassium 4.0 03/03/2019 10:10 AM    Chloride 110 (H) 03/03/2019 10:10 AM    CO2 25 03/03/2019 10:10 AM    Anion gap 8 03/03/2019 10:10 AM    Glucose 88 03/03/2019 10:10 AM    BUN 13 03/03/2019 10:10 AM    Creatinine 0.76 03/03/2019 10:10 AM    BUN/Creatinine ratio 17 03/03/2019 10:10 AM    GFR est AA >60 03/03/2019 10:10 AM    GFR est non-AA >60 03/03/2019 10:10 AM    Calcium 8.6 03/03/2019 10:10 AM    Alk. phosphatase 82 03/03/2019 10:10 AM    Protein, total 7.1 03/03/2019 10:10 AM    Albumin 4.1 03/03/2019 10:10 AM    Globulin 3.0 03/03/2019 10:10 AM    A-G Ratio 1.4 03/03/2019 10:10 AM    ALT (SGPT) 24 03/03/2019 10:10 AM       Lab  Results   Component Value Date/Time    Reticulocyte count 1.5 09/05/2016 09:40 AM    Iron % saturation 18 07/22/2017 10:57 AM    TIBC 198 (L) 07/22/2017 10:57 AM  Ferritin 1,447 (H) 07/22/2017 10:57 AM    Vitamin B12 542 07/30/2016 09:31 AM    Folate 14.8 07/30/2016 09:31 AM    Haptoglobin 412 (H) 09/05/2016 09:40 AM    LD 192 09/05/2016 09:40 AM    Sed rate, automated 67 (H) 11/28/2016 05:25 AM    C-Reactive protein 21.70 (H) 11/28/2016 05:25 AM    TSH 1.74 03/03/2019 10:10 AM     Lab Results   Component Value Date/Time    INR 1.0 02/15/2017 03:11 PM    aPTT 28.7 08/18/2009 01:05 PM       Assessment/Plan:  78 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  PS 0    1. Right Breast cancer stage: IA    Hormonal therapy: not indicated due to receptor (-) status    We explained to the patient that the goal of systemic adjuvant therapy is to improve the chances for cure and decrease the risk of relapse. We explained why a patient can have microscopic cancer spread now even though physical examination, laboratory studies and imaging studies are negative for cancer. We explained that the same treatments used now as adjuvant or preventive treatments rarely if ever are curative in women who develop metastases.     No evidence of recurrence    Mammogram in Sept 17, 2020, benign    She declined to take neratinib.    2. Emotional well being: She has excellent support and is coping well with her disease.    3. FH of breast cancer: BRCA 1/2 [place] testing negative    4. Osteoporosis: On dexa from 2015; was on fosamax from Dr. Wanda Plump, started 08/2016, but stopped prior to surgery in 11/2016. DEXA 03/15/16 showed some improvement. Dr. Wanda Plump has recommended Reclast    We discussed the data from Bassett, [place] 2013, for the meta-analysis for adjuvant bisphosphonate use in breast cancer.  We discussed that in postmenopausal women, it appears that the bisphosphate  zolendronic acid, given as in ABCSG 12 at 4 mg IV q 6 months x 3 years, is beneficial in improving bone DFS and OS.  We discussed side effects such as ONJ and the need to avoid invasive dental procedures while on these medications, renal damage, and hypocalcemia.    After this discussion, she is agreeable to zometa as above.  #1, 12/29/2018, #2 06/29/19    5. Back pain low/radiculopathy: Dr. Dema Severin, pain specialist, was following her. NM bone scan and xr show degeneration. Steroid injection in of Octobert; L-spine MRI 01/19/16 shows DDD.  States last steroid injection 09/24/16.   This has improved.     6. R leg DVT: provoked, in non weight bearing leg s/p surgery. Reports she stopped Eliquis 08/2017.     7. Constipation:  Ok on stool softners    Thank you for this consult.  All of the patient's questions were answered today.        Sonda Rumble, MD

## 2019-06-29 NOTE — Progress Notes (Signed)
[facility] Progress Note    Pt admit to [facility] for Zometa ambulatory in stable condition. Assessment completed. No new concerns voiced.    Peripheral IV 24 g established in left AC with positive blood return. Labs obtained and sent for processing. I-stat performed. Results are within parameters to treat.    Patient states she did not have any invasive dental procedures within the last 6-8 weeks nor none scheduled.     Visit Vitals  BP 115/63 (BP 1 Location: Left arm, BP Patient Position: Sitting)   Pulse 68   Temp 97.5 ??F (36.4 ??C)   Resp 18   Wt 67 kg (147 lb 12.8 oz)   LMP 04/13/2010   SpO2 98%   Breastfeeding No   BMI 24.60 kg/m??     Patient Vitals for the past 12 hrs:   Temp Pulse Resp BP SpO2   06/29/19 1126 ??? 68 18 115/63 98 %   06/29/19 0913 97.5 ??F (36.4 ??C) 77 18 113/64 96 %     Recent Results (from the past 12 hour(s))   POC CHEM8    Collection Time: 06/29/19  9:23 AM   Result Value Ref Range    Calcium, ionized (POC) 1.18 1.12 - 1.32 mmol/L    Sodium (POC) 142 136 - 145 mmol/L    Potassium (POC) 4.1 3.5 - 5.1 mmol/L    Chloride (POC) 105 98 - 107 mmol/L    CO2 (POC) 28 21 - 32 mmol/L    Anion gap (POC) 14 10 - 20 mmol/L    Glucose (POC) 61 (L) 65 - 100 mg/dL    BUN (POC) 15 9 - 20 mg/dL    Creatinine (POC) 0.9 0.6 - 1.3 mg/dL    GFRAA, POC >60 >60 ml/min/1.4m2    GFRNA, POC >60 >60 ml/min/1.60m2    Hematocrit (POC) 37 35.0 - 47.0 %    Comment Comment Not Indicated.       Medications:  Medications Administered     0.9% sodium chloride infusion     Admin Date  06/29/2019 Action  New Bag Dose  25 mL/hr Rate  25 mL/hr Route  IntraVENous Administered By  Rebeca Alert, RN          zoledronic acid (ZOMETA) 3.5 mg in 0.9% sodium chloride 100 mL IVPB     Admin Date  06/29/2019 Action  Given Dose  3.5 mg Route  IntraVENous Administered By  Rebeca Alert, RN              Pt tolerated treatment well.PIV taken out with no issues, pressure applied, wrapped in 2x2 gauze and coban. D/c home ambulatory in no  distress. Pt aware of next appointment scheduled for     Future Appointments   Date Time Provider [place]   09/08/2019  9:00 AM [name], MD [facility] BS AMB   12/28/2019  9:00 AM SS INF7 CH2 <1H [facility]   12/28/2019  9:15 AM Joyce Gross, MD [facility] BS AMB   06/27/2020  9:00 AM SS INF7 CH2 <1H [facility]

## 2019-09-08 ENCOUNTER — Ambulatory Visit: Admit: 2019-09-08 | Payer: MEDICARE | Attending: Internal Medicine | Primary: Internal Medicine

## 2019-09-08 ENCOUNTER — Ambulatory Visit: Attending: Internal Medicine | Primary: Internal Medicine

## 2019-09-08 DIAGNOSIS — E785 Hyperlipidemia, unspecified: Secondary | ICD-10-CM

## 2019-09-08 MED ORDER — EZETIMIBE 10 MG TAB
10 mg | ORAL_TABLET | Freq: Every day | ORAL | 1 refills | Status: DC
Start: 2019-09-08 — End: 2020-03-03

## 2019-09-08 NOTE — Progress Notes (Signed)
Message sent about labs  Add zetia for lipids

## 2019-09-08 NOTE — Progress Notes (Signed)
HISTORY OF PRESENT ILLNESS  Jillian Bean is a 79 y.o. female.  HPI  Jillian Bean is seen to discuss her cholesterol.  Her total is 240, LDL 147.  She tolerates four Welchol a day, but has not tolerated statins and is fearful of having colitis from a statin.  We have discussed other options to get her LDL lower.  Did discuss Zetia as an add-on to the [facility].    Constipation.  She seems to be managing with Colace.    Thyroid, on 125 mcg dose.  Again, only symptom is constipation. Will check levels.      Has new hearing aids.  Has been getting Zometa with oncology and has done well with this.    Scheduled for her first COVID vaccine in a week.      Review of Systems   Constitutional: Negative for chills, fever and weight loss.   Respiratory: Negative for cough, shortness of breath and wheezing.    Cardiovascular: Negative for chest pain, palpitations, orthopnea, leg swelling and PND.   Gastrointestinal: Positive for constipation. Negative for heartburn and nausea.   Musculoskeletal: Negative for myalgias.   Neurological: Negative for dizziness and headaches.       Physical Exam  Vitals signs and nursing note reviewed.   Constitutional:       Appearance: She is well-developed.   HENT:      Head: Normocephalic and atraumatic.   Neck:      Musculoskeletal: Normal range of motion and neck supple.      Thyroid: No thyromegaly.      Vascular: No carotid bruit.   Cardiovascular:      Rate and Rhythm: Normal rate and regular rhythm.      Heart sounds: Normal heart sounds, S1 normal and S2 normal. No murmur.   Pulmonary:      Effort: Pulmonary effort is normal. No respiratory distress.      Breath sounds: Normal breath sounds. No wheezing or rales.   Musculoskeletal:      Right lower leg: No edema.      Left lower leg: No edema.   Lymphadenopathy:      Cervical: No cervical adenopathy.   Neurological:      Mental Status: She is alert and oriented to person, place, and time.   Psychiatric:         Behavior: Behavior normal.          ASSESSMENT and PLAN  Diagnoses and all orders for this visit:    1. Dyslipidemia, goal LDL below 100-add zetia to welcol  -     ezetimibe (ZETIA) 10 mg tablet; Take 1 Tab by mouth daily.  -     METABOLIC PANEL, COMPREHENSIVE; Future  -     LIPID PANEL; Future    2. Hypothyroidism due to acquired atrophy of thyroid  -     TSH 3RD GENERATION; Future    3. History of DVT of lower extremity-no sign of recurrence on asa    4. Malignant neoplasm of right breast in female, estrogen receptor positive, unspecified site of breast (HCC)-cont on zometa with dr Laurena Bering  appt with me in 88mo

## 2019-09-08 NOTE — Progress Notes (Signed)
HISTORY OF PRESENT ILLNESS  Jillian Bean is a 79 y.o. female.  HPI  Jillian Bean is seen to discuss her cholesterol.  Her total is 240, LDL 147.  She tolerates four Welchol a day, but has not tolerated statins and is fearful of having colitis from a statin.  We have discussed other options to get her LDL lower.  Did discuss Zetia as an add-on to the [facility].    Constipation.  She seems to be managing with Colace.    Thyroid, on 125 mcg dose.  Again, only symptom is constipation. Will check levels.      Has new hearing aids.  Has been getting Zometa with oncology and has done well with this.    Scheduled for her first COVID vaccine in a week.      Review of Systems   Constitutional: Negative for chills, fever and weight loss.   Respiratory: Negative for cough, shortness of breath and wheezing.    Cardiovascular: Negative for chest pain, palpitations, orthopnea, leg swelling and PND.   Gastrointestinal: Positive for constipation. Negative for heartburn and nausea.   Musculoskeletal: Negative for myalgias.   Neurological: Negative for dizziness and headaches.       Physical Exam  Vitals signs and nursing note reviewed.   Constitutional:       Appearance: She is well-developed.   HENT:      Head: Normocephalic and atraumatic.   Neck:      Musculoskeletal: Normal range of motion and neck supple.      Thyroid: No thyromegaly.      Vascular: No carotid bruit.   Cardiovascular:      Rate and Rhythm: Normal rate and regular rhythm.      Heart sounds: Normal heart sounds, S1 normal and S2 normal. No murmur.   Pulmonary:      Effort: Pulmonary effort is normal. No respiratory distress.      Breath sounds: Normal breath sounds. No wheezing or rales.   Musculoskeletal:      Right lower leg: No edema.      Left lower leg: No edema.   Lymphadenopathy:      Cervical: No cervical adenopathy.   Neurological:      Mental Status: She is alert and oriented to person, place, and time.   Psychiatric:         Behavior: Behavior normal.          ASSESSMENT and PLAN  Diagnoses and all orders for this visit:    1. Dyslipidemia, goal LDL below 100-add zetia to welcol  -     ezetimibe (ZETIA) 10 mg tablet; Take 1 Tab by mouth daily.  -     METABOLIC PANEL, COMPREHENSIVE; Future  -     LIPID PANEL; Future    2. Hypothyroidism due to acquired atrophy of thyroid  -     TSH 3RD GENERATION; Future    3. History of DVT of lower extremity-no sign of recurrence on asa    4. Malignant neoplasm of right breast in female, estrogen receptor positive, unspecified site of breast (HCC)-cont on zometa with dr Laurena Bering  appt with me in 88mo

## 2019-09-09 LAB — METABOLIC PANEL, COMPREHENSIVE
A-G Ratio: 1.4 (ref 1.1–2.2)
ALT (SGPT): 22 U/L (ref 12–78)
AST (SGOT): 20 U/L (ref 15–37)
Albumin: 4.2 g/dL (ref 3.5–5.0)
Alk. phosphatase: 87 U/L (ref 45–117)
Anion gap: 6 mmol/L (ref 5–15)
BUN/Creatinine ratio: 17 (ref 12–20)
BUN: 13 MG/DL (ref 6–20)
Bilirubin, total: 0.4 MG/DL (ref 0.2–1.0)
CO2: 28 mmol/L (ref 21–32)
Calcium: 8.8 MG/DL (ref 8.5–10.1)
Chloride: 109 mmol/L — ABNORMAL HIGH (ref 97–108)
Creatinine: 0.77 MG/DL (ref 0.55–1.02)
GFR est AA: >60 mL/min/{1.73_m2} (ref >60)
GFR est non-AA: >60 mL/min/{1.73_m2} (ref >60)
Globulin: 2.9 g/dL (ref 2.0–4.0)
Glucose: 86 mg/dL (ref 65–100)
Potassium: 4.2 mmol/L (ref 3.5–5.1)
Protein, total: 7.1 g/dL (ref 6.4–8.2)
Sodium: 143 mmol/L (ref 136–145)

## 2019-09-09 LAB — LIPID PANEL
CHOL/HDL Ratio: 3.5 (ref 0.0–5.0)
Chol/HDL Ratio: 3.5 (ref 0.0–5.0)
Cholesterol, Total: 226 MG/DL — ABNORMAL HIGH (ref <200)
Cholesterol, total: 226 MG/DL — ABNORMAL HIGH (ref <200)
HDL Cholesterol: 65 MG/DL
HDL: 65 MG/DL
LDL Calculated: 139.4 MG/DL — ABNORMAL HIGH (ref 0–100)
LDL, calculated: 139.4 MG/DL — ABNORMAL HIGH (ref 0–100)
Triglyceride: 108 MG/DL (ref <150)
Triglycerides: 108 MG/DL (ref <150)
VLDL Cholesterol Calculated: 21.6 MG/DL
VLDL, calculated: 21.6 MG/DL

## 2019-09-09 LAB — TSH 3RD GENERATION
TSH: 0.54 u[IU]/mL (ref 0.36–3.74)
TSH: 0.54 u[IU]/mL (ref 0.36–3.74)

## 2019-09-09 LAB — COMPREHENSIVE METABOLIC PANEL
ALT: 22 U/L (ref 12–78)
AST: 20 U/L (ref 15–37)
Albumin/Globulin Ratio: 1.4 (ref 1.1–2.2)
Albumin: 4.2 g/dL (ref 3.5–5.0)
Alkaline Phosphatase: 87 U/L (ref 45–117)
Anion Gap: 6 mmol/L (ref 5–15)
BUN: 13 MG/DL (ref 6–20)
Bun/Cre Ratio: 17 (ref 12–20)
CO2: 28 mmol/L (ref 21–32)
Calcium: 8.8 MG/DL (ref 8.5–10.1)
Chloride: 109 mmol/L — ABNORMAL HIGH (ref 97–108)
Creatinine: 0.77 MG/DL (ref 0.55–1.02)
EGFR IF NonAfrican American: >60 mL/min/{1.73_m2} (ref >60)
GFR African American: >60 mL/min/{1.73_m2} (ref >60)
Globulin: 2.9 g/dL (ref 2.0–4.0)
Glucose: 86 mg/dL (ref 65–100)
Potassium: 4.2 mmol/L (ref 3.5–5.1)
Sodium: 143 mmol/L (ref 136–145)
Total Bilirubin: 0.4 MG/DL (ref 0.2–1.0)
Total Protein: 7.1 g/dL (ref 6.4–8.2)

## 2019-09-10 ENCOUNTER — Ambulatory Visit: Admit: 2019-09-10 | Payer: MEDICARE | Primary: Internal Medicine

## 2019-09-10 ENCOUNTER — Ambulatory Visit: Primary: Internal Medicine

## 2019-09-10 DIAGNOSIS — Z23 Encounter for immunization: Secondary | ICD-10-CM

## 2019-10-06 ENCOUNTER — Encounter

## 2019-10-08 ENCOUNTER — Ambulatory Visit: Admit: 2019-10-08 | Payer: MEDICARE | Primary: Internal Medicine

## 2019-10-08 ENCOUNTER — Ambulatory Visit: Primary: Internal Medicine

## 2019-10-08 DIAGNOSIS — Z23 Encounter for immunization: Secondary | ICD-10-CM

## 2019-10-09 ENCOUNTER — Encounter

## 2019-10-09 ENCOUNTER — Inpatient Hospital Stay: Admit: 2019-10-09 | Payer: MEDICARE | Attending: Ophthalmology | Primary: Internal Medicine

## 2019-10-09 DIAGNOSIS — H5030 Unspecified intermittent heterotropia: Secondary | ICD-10-CM

## 2019-10-09 MED ORDER — IOPAMIDOL 76 % IV SOLN
76 % | INTRAVENOUS | Status: DC
Start: 2019-10-09 — End: 2019-10-10

## 2019-10-09 MED ORDER — IOPAMIDOL 76 % IV SOLN
76 % | Freq: Once | INTRAVENOUS | Status: AC
Start: 2019-10-09 — End: 2019-10-09
  Administered 2019-10-09: 22:00:00 via INTRAVENOUS

## 2019-10-09 MED FILL — ISOVUE-370  76 % INTRAVENOUS SOLUTION: 370 mg iodine /mL (76 %) | INTRAVENOUS | Qty: 100

## 2019-10-10 LAB — CREATININE, POC
Creatinine (POC): 0.8 mg/dL (ref 0.6–1.3)
GFRAA, POC: >60 mL/min/{1.73_m2} (ref >60)
GFRNA, POC: >60 mL/min/{1.73_m2} (ref >60)

## 2019-10-10 LAB — AMB POC CREATININE
GFR African American: >60 mL/min/{1.73_m2} (ref >60)
GFR Non-African American: >60 mL/min/{1.73_m2} (ref >60)
POC Creatinine: 0.8 mg/dL (ref 0.6–1.3)

## 2019-10-15 NOTE — Telephone Encounter (Signed)
From: Jillian Bean  To: [name], MD  Sent: 10/15/2019 1:49 PM EST  Subject: Update Medical Information    This is to advise that I received the Covid-19 Vaccines on: 1) September 10, 2019; and the 2) October 08, 2019. Please update my medical records. Thx.

## 2019-11-01 ENCOUNTER — Encounter

## 2019-11-02 MED ORDER — COLESEVELAM 625 MG TAB
625 mg | ORAL_TABLET | ORAL | 3 refills | Status: DC
Start: 2019-11-02 — End: 2020-07-20

## 2019-11-12 ENCOUNTER — Encounter

## 2019-11-19 ENCOUNTER — Inpatient Hospital Stay: Admit: 2019-11-19 | Payer: MEDICARE | Attending: Ophthalmology | Primary: Internal Medicine

## 2019-11-19 DIAGNOSIS — G7000 Myasthenia gravis without (acute) exacerbation: Secondary | ICD-10-CM

## 2019-11-25 ENCOUNTER — Emergency Department: Admit: 2019-11-25 | Payer: MEDICARE | Primary: Internal Medicine

## 2019-11-25 ENCOUNTER — Inpatient Hospital Stay
Admit: 2019-11-25 | Discharge: 2019-11-25 | Disposition: A | Discharge status: Home / Self Care | Payer: MEDICARE | Attending: Emergency Medicine

## 2019-11-25 DIAGNOSIS — H5712 Ocular pain, left eye: Secondary | ICD-10-CM

## 2019-11-25 LAB — METABOLIC PANEL, COMPREHENSIVE
A-G Ratio: 1.4 (ref 1.1–2.2)
ALT (SGPT): 22 U/L (ref 12–78)
AST (SGOT): 17 U/L (ref 15–37)
Albumin: 4 g/dL (ref 3.5–5.0)
Alk. phosphatase: 77 U/L (ref 45–117)
Anion gap: 2 mmol/L — ABNORMAL LOW (ref 5–15)
BUN/Creatinine ratio: 16 (ref 12–20)
BUN: 12 MG/DL (ref 6–20)
Bilirubin, total: 0.4 MG/DL (ref 0.2–1.0)
CO2: 26 mmol/L (ref 21–32)
Calcium: 8.6 MG/DL (ref 8.5–10.1)
Chloride: 112 mmol/L — ABNORMAL HIGH (ref 97–108)
Creatinine: 0.73 MG/DL (ref 0.55–1.02)
GFR est AA: >60 mL/min/{1.73_m2} (ref >60)
GFR est non-AA: >60 mL/min/{1.73_m2} (ref >60)
Globulin: 2.8 g/dL (ref 2.0–4.0)
Glucose: 92 mg/dL (ref 65–100)
Potassium: 4 mmol/L (ref 3.5–5.1)
Protein, total: 6.8 g/dL (ref 6.4–8.2)
Sodium: 140 mmol/L (ref 136–145)

## 2019-11-25 LAB — CBC WITH AUTOMATED DIFF
ABS. BASOPHILS: 0 10*3/uL (ref 0.0–0.1)
ABS. EOSINOPHILS: 0.2 10*3/uL (ref 0.0–0.4)
ABS. IMM. GRANS.: 0 10*3/uL (ref 0.00–0.04)
ABS. LYMPHOCYTES: 0.9 10*3/uL (ref 0.8–3.5)
ABS. MONOCYTES: 0.3 10*3/uL (ref 0.0–1.0)
ABS. NEUTROPHILS: 3.6 10*3/uL (ref 1.8–8.0)
ABSOLUTE NRBC: 0 10*3/uL (ref 0.00–0.01)
BASOPHILS: 1 % (ref 0–1)
EOSINOPHILS: 3 % (ref 0–7)
HCT: 34.7 % — ABNORMAL LOW (ref 35.0–47.0)
HGB: 11 g/dL — ABNORMAL LOW (ref 11.5–16.0)
IMMATURE GRANULOCYTES: 0 % (ref 0.0–0.5)
LYMPHOCYTES: 18 % (ref 12–49)
MCH: 28.7 PG (ref 26.0–34.0)
MCHC: 31.7 g/dL (ref 30.0–36.5)
MCV: 90.6 FL (ref 80.0–99.0)
MONOCYTES: 7 % (ref 5–13)
MPV: 9.3 FL (ref 8.9–12.9)
NEUTROPHILS: 71 % (ref 32–75)
NRBC: 0 PER 100 WBC
PLATELET: 209 10*3/uL (ref 150–400)
RBC: 3.83 M/uL (ref 3.80–5.20)
RDW: 12.5 % (ref 11.5–14.5)
WBC: 5 10*3/uL (ref 3.6–11.0)

## 2019-11-25 LAB — SED RATE (ESR): Sed rate, automated: 19 mm/hr (ref 0–30)

## 2019-11-25 LAB — SAMPLES BEING HELD

## 2019-11-25 LAB — COMPREHENSIVE METABOLIC PANEL
ALT: 22 U/L (ref 12–78)
AST: 17 U/L (ref 15–37)
Albumin/Globulin Ratio: 1.4 (ref 1.1–2.2)
Albumin: 4 g/dL (ref 3.5–5.0)
Alkaline Phosphatase: 77 U/L (ref 45–117)
Anion Gap: 2 mmol/L — ABNORMAL LOW (ref 5–15)
BUN: 12 MG/DL (ref 6–20)
Bun/Cre Ratio: 16 (ref 12–20)
CO2: 26 mmol/L (ref 21–32)
Calcium: 8.6 MG/DL (ref 8.5–10.1)
Chloride: 112 mmol/L — ABNORMAL HIGH (ref 97–108)
Creatinine: 0.73 MG/DL (ref 0.55–1.02)
EGFR IF NonAfrican American: >60 mL/min/{1.73_m2} (ref >60)
GFR African American: >60 mL/min/{1.73_m2} (ref >60)
Globulin: 2.8 g/dL (ref 2.0–4.0)
Glucose: 92 mg/dL (ref 65–100)
Potassium: 4 mmol/L (ref 3.5–5.1)
Sodium: 140 mmol/L (ref 136–145)
Total Bilirubin: 0.4 MG/DL (ref 0.2–1.0)
Total Protein: 6.8 g/dL (ref 6.4–8.2)

## 2019-11-25 LAB — CBC WITH AUTO DIFFERENTIAL
Basophils %: 1 % (ref 0–1)
Basophils Absolute: 0 10*3/uL (ref 0.0–0.1)
Eosinophils %: 3 % (ref 0–7)
Eosinophils Absolute: 0.2 10*3/uL (ref 0.0–0.4)
Granulocyte Absolute Count: 0 10*3/uL (ref 0.00–0.04)
Hematocrit: 34.7 % — ABNORMAL LOW (ref 35.0–47.0)
Hemoglobin: 11 g/dL — ABNORMAL LOW (ref 11.5–16.0)
Immature Granulocytes: 0 % (ref 0.0–0.5)
Lymphocytes %: 18 % (ref 12–49)
Lymphocytes Absolute: 0.9 10*3/uL (ref 0.8–3.5)
MCH: 28.7 PG (ref 26.0–34.0)
MCHC: 31.7 g/dL (ref 30.0–36.5)
MCV: 90.6 FL (ref 80.0–99.0)
MPV: 9.3 FL (ref 8.9–12.9)
Monocytes %: 7 % (ref 5–13)
Monocytes Absolute: 0.3 10*3/uL (ref 0.0–1.0)
NRBC Absolute: 0 10*3/uL (ref 0.00–0.01)
Neutrophils %: 71 % (ref 32–75)
Neutrophils Absolute: 3.6 10*3/uL (ref 1.8–8.0)
Nucleated RBCs: 0 PER 100 WBC
Platelets: 209 10*3/uL (ref 150–400)
RBC: 3.83 M/uL (ref 3.80–5.20)
RDW: 12.5 % (ref 11.5–14.5)
WBC: 5 10*3/uL (ref 3.6–11.0)

## 2019-11-25 LAB — SEDIMENTATION RATE: Sed Rate: 19 mm/hr (ref 0–30)

## 2019-11-25 NOTE — ED Notes (Signed)
Patient discharged by provider.  Discharge paperwork reviewed and patient denies questions.  Leaves ambulatory and in no apparent distress

## 2019-11-25 NOTE — ED Notes (Signed)
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

## 2019-11-25 NOTE — ED Notes (Signed)
0430 rt leg pain resolved, left eye pain. Dx myastenia gravis. Left eye dull pain at this time. Walking is normal, denies chest pain, SOB, slur speech. NIH 0.

## 2019-11-25 NOTE — ED Notes (Signed)
 Patient reports R leg pain has gone away.

## 2019-11-25 NOTE — ED Provider Notes (Signed)
HPI the patient was awakened at 4 AM with a "stabbing" pain in her left eye which is now mostly resolved and left her with a dull pain in the left eye and a left frontal headache.  At about the same time she had a pain in her right medial thigh which has completely resolved.  She was recently diagnosed with ocular myasthenia gravis and was started on Mestinon on 4/7.  Her MG symptoms are diplopia.  This is unchanged.  She also denies having blurred vision, muscular weakness, fever, vomiting, chest pain, shortness of breath or other complaints.    Past Medical History:   Diagnosis Date   ??? Anemia    ??? Back pain 09/01/2007   ??? Cancer (HCC)     R breast   ??? Chronic pain     BACK PAIN   ??? Coagulation disorder ([place])     IRON DEFFICIENCY ANEMIA   ??? Colonic polyps 08/31/1998   ??? 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 DVT (deep venous thrombosis) ([place]) 01/2017   ??? Hiatal hernia 07/11/2011    Dr.sobieski   ??? Hypercholesteremia 09/01/2007   ??? Hypothyroidism 09/01/2003   ??? 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 Nausea & vomiting    ??? OA (osteoarthritis) 09/01/2007   ??? Other ill-defined conditions(799.89)     VERTIGO   ??? Plantar fasciitis 09/01/2007   ??? S/P colonoscopy 10/10/2007   ??? Thromboembolus ([place]) 01/2017    RIGHT LEG   ??? Thyroid disease        Past Surgical History:   Procedure Laterality Date   ??? COLONOSCOPY N/A 11/12/2016    COLONOSCOPY performed by Burnett Harry, MD at [place]   ??? COLONOSCOPY,DIAGNOSTIC  11/12/2016        ??? ENDOSCOPY, COLON, DIAGNOSTIC  7/12/18/08    7/05 Dr Maudry Diego   ??? HX BREAST LUMPECTOMY Right 2016   ??? HX BREAST REDUCTION  1993   ??? HX CATARACT REMOVAL Right 01/21/2018   ??? HX CATARACT REMOVAL Left 03/11/2018   ??? HX COLONOSCOPY  2014    Dr Illene Silver   ??? HX GI      COLONOSCOPY   ??? HX HEENT  04/07/10    thyroid biopsy neg dr Leward Quan   ??? HX HEENT      wisdom teeth extraction   ??? HX HYSTERECTOMY  1975     PARTIAL   ??? HX KNEE REPLACEMENT Left 08/22/09   ??? HX KNEE REPLACEMENT Right 08/13/12   ??? HX ORTHOPAEDIC Right 11/26/2016    cleaned infection in joint   ??? HX ORTHOPAEDIC Right 03/2017    revision total knee replacement   ??? HX OTHER SURGICAL  2013    MINIMALLY INVASIVE LUMBAR DECOMPRESSION   ??? NEUROLOGICAL PROCEDURE UNLISTED  2013    LUMBAR DECOMPRESSION   ??? UPPER GI ENDOSCOPY,BIOPSY  11/12/2016              Family History:   Problem Relation Age of Onset   ??? Cancer Mother         Breast   ??? Hypertension Mother    ??? Heart Failure Father         MI   ??? Heart Attack Father    ??? Hypertension Father    ??? MS Brother    ??? Anesth Problems Neg Hx  Social History     Socioeconomic History   ??? Marital status: MARRIED     Spouse name: Not on file   ??? Number of children: Not on file   ??? Years of education: Not on file   ??? Highest education level: Not on file   Occupational History   ??? Not on file   Social Needs   ??? Financial resource strain: Not on file   ??? Food insecurity     Worry: Not on file     Inability: Not on file   ??? Transportation needs     Medical: Not on file     Non-medical: Not on file   Tobacco Use   ??? Smoking status: Never Smoker   ??? Smokeless tobacco: Never Used   Substance and Sexual Activity   ??? Alcohol use: No   ??? Drug use: No   ??? Sexual activity: Not Currently   Lifestyle   ??? Physical activity     Days per week: Not on file     Minutes per session: Not on file   ??? Stress: Not on file   Relationships   ??? Social Product manager on phone: Not on file     Gets together: Not on file     Attends religious service: Not on file     Active member of club or organization: Not on file     Attends meetings of clubs or organizations: Not on file     Relationship status: Not on file   ??? Intimate partner violence     Fear of current or ex partner: Not on file     Emotionally abused: Not on file     Physically abused: Not on file     Forced sexual activity: Not on file   Other Topics Concern   ??? Not on file   Social  History Narrative   ??? Not on file         ALLERGIES: Atorvastatin, Zocor [simvastatin], Celebrex [celecoxib], Gabapentin, Levaquin [levofloxacin], Nexium [esomeprazole magnesium], Other medication, Oxycontin [oxycodone], Pravastatin, Statins-hmg-coa reductase inhibitors, and Yellow dye    Review of Systems   Constitutional: Negative for fever.   HENT: Negative for voice change.    Eyes: Positive for pain.   Respiratory: Negative for cough and shortness of breath.    Cardiovascular: Negative for chest pain.   Gastrointestinal: Negative for abdominal pain, nausea and vomiting.   Genitourinary: Negative for flank pain.   Musculoskeletal: Positive for myalgias. Negative for back pain.   Skin: Negative for rash.   Neurological: Positive for headaches.   Psychiatric/Behavioral: Negative for confusion.       Vitals:    11/25/19 0743   BP: (!) 168/73   Pulse: 76   Resp: 14   Temp: 97 ??F (36.1 ??C)   SpO2: 97%   Weight: 66.7 kg (147 lb)            Physical Exam  Constitutional:       General: She is not in acute distress.     Appearance: She is well-developed.   HENT:      Head: Normocephalic.      Comments: There is no temporal artery tenderness, swelling or periorbital tenderness.  Eyes:      Extraocular Movements: Extraocular movements intact.      Conjunctiva/sclera: Conjunctivae normal.      Pupils: Pupils are equal, round, and reactive to light.   Neck:  Musculoskeletal: Normal range of motion.   Cardiovascular:      Rate and Rhythm: Normal rate.      Heart sounds: No murmur.   Pulmonary:      Effort: Pulmonary effort is normal.      Breath sounds: Normal breath sounds.   Abdominal:      Palpations: Abdomen is soft.      Tenderness: There is no abdominal tenderness.   Musculoskeletal: Normal range of motion.      Comments: The right leg is nontender, nonswollen and distal pulses are normal.   Skin:     General: Skin is warm and dry.      Capillary Refill: Capillary refill takes less than 2 seconds.   Neurological:       Mental Status: She is alert.   Psychiatric:         Behavior: Behavior normal.          MDM       Procedures

## 2019-11-25 NOTE — ED Triage Notes (Addendum)
0430 rt leg pain resolved, left eye pain. Dx myastenia gravis. Left eye dull pain at this time. Walking is normal, denies chest pain, SOB, slur speech. NIH 0.

## 2019-11-25 NOTE — ED Notes (Signed)
Patient reports R leg pain "has gone away."

## 2019-11-25 NOTE — ED Provider Notes (Signed)
HPI the patient was awakened at 4 AM with a "stabbing" pain in her left eye which is now mostly resolved and left her with a dull pain in the left eye and a left frontal headache.  At about the same time she had a pain in her right medial thigh which has completely resolved.  She was recently diagnosed with ocular myasthenia gravis and was started on Mestinon on 4/7.  Her MG symptoms are diplopia.  This is unchanged.  She also denies having blurred vision, muscular weakness, fever, vomiting, chest pain, shortness of breath or other complaints.    Past Medical History:   Diagnosis Date   ??? Anemia    ??? Back pain 09/01/2007   ??? Cancer (HCC)     R breast   ??? Chronic pain     BACK PAIN   ??? Coagulation disorder ([place])     IRON DEFFICIENCY ANEMIA   ??? Colonic polyps 08/31/1998   ??? 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 DVT (deep venous thrombosis) ([place]) 01/2017   ??? Hiatal hernia 07/11/2011    Dr.sobieski   ??? Hypercholesteremia 09/01/2007   ??? Hypothyroidism 09/01/2003   ??? 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 Nausea & vomiting    ??? OA (osteoarthritis) 09/01/2007   ??? Other ill-defined conditions(799.89)     VERTIGO   ??? Plantar fasciitis 09/01/2007   ??? S/P colonoscopy 10/10/2007   ??? Thromboembolus ([place]) 01/2017    RIGHT LEG   ??? Thyroid disease        Past Surgical History:   Procedure Laterality Date   ??? COLONOSCOPY N/A 11/12/2016    COLONOSCOPY performed by Burnett Harry, MD at [place]   ??? COLONOSCOPY,DIAGNOSTIC  11/12/2016        ??? ENDOSCOPY, COLON, DIAGNOSTIC  7/12/18/08    7/05 Dr Maudry Diego   ??? HX BREAST LUMPECTOMY Right 2016   ??? HX BREAST REDUCTION  1993   ??? HX CATARACT REMOVAL Right 01/21/2018   ??? HX CATARACT REMOVAL Left 03/11/2018   ??? HX COLONOSCOPY  2014    Dr Illene Silver   ??? HX GI      COLONOSCOPY   ??? HX HEENT  04/07/10    thyroid biopsy neg dr Leward Quan   ??? HX HEENT      wisdom teeth extraction   ??? HX HYSTERECTOMY  1975     PARTIAL   ??? HX KNEE REPLACEMENT Left 08/22/09   ??? HX KNEE REPLACEMENT Right 08/13/12   ??? HX ORTHOPAEDIC Right 11/26/2016    cleaned infection in joint   ??? HX ORTHOPAEDIC Right 03/2017    revision total knee replacement   ??? HX OTHER SURGICAL  2013    MINIMALLY INVASIVE LUMBAR DECOMPRESSION   ??? NEUROLOGICAL PROCEDURE UNLISTED  2013    LUMBAR DECOMPRESSION   ??? UPPER GI ENDOSCOPY,BIOPSY  11/12/2016              Family History:   Problem Relation Age of Onset   ??? Cancer Mother         Breast   ??? Hypertension Mother    ??? Heart Failure Father         MI   ??? Heart Attack Father    ??? Hypertension Father    ??? MS Brother    ??? Anesth Problems Neg Hx  Social History     Socioeconomic History   ??? Marital status: MARRIED     Spouse name: Not on file   ??? Number of children: Not on file   ??? Years of education: Not on file   ??? Highest education level: Not on file   Occupational History   ??? Not on file   Social Needs   ??? Financial resource strain: Not on file   ??? Food insecurity     Worry: Not on file     Inability: Not on file   ??? Transportation needs     Medical: Not on file     Non-medical: Not on file   Tobacco Use   ??? Smoking status: Never Smoker   ??? Smokeless tobacco: Never Used   Substance and Sexual Activity   ??? Alcohol use: No   ??? Drug use: No   ??? Sexual activity: Not Currently   Lifestyle   ??? Physical activity     Days per week: Not on file     Minutes per session: Not on file   ??? Stress: Not on file   Relationships   ??? Social Product manager on phone: Not on file     Gets together: Not on file     Attends religious service: Not on file     Active member of club or organization: Not on file     Attends meetings of clubs or organizations: Not on file     Relationship status: Not on file   ??? Intimate partner violence     Fear of current or ex partner: Not on file     Emotionally abused: Not on file     Physically abused: Not on file     Forced sexual activity: Not on file   Other Topics Concern   ??? Not on file   Social  History Narrative   ??? Not on file         ALLERGIES: Atorvastatin, Zocor [simvastatin], Celebrex [celecoxib], Gabapentin, Levaquin [levofloxacin], Nexium [esomeprazole magnesium], Other medication, Oxycontin [oxycodone], Pravastatin, Statins-hmg-coa reductase inhibitors, and Yellow dye    Review of Systems   Constitutional: Negative for fever.   HENT: Negative for voice change.    Eyes: Positive for pain.   Respiratory: Negative for cough and shortness of breath.    Cardiovascular: Negative for chest pain.   Gastrointestinal: Negative for abdominal pain, nausea and vomiting.   Genitourinary: Negative for flank pain.   Musculoskeletal: Positive for myalgias. Negative for back pain.   Skin: Negative for rash.   Neurological: Positive for headaches.   Psychiatric/Behavioral: Negative for confusion.       Vitals:    11/25/19 0743   BP: (!) 168/73   Pulse: 76   Resp: 14   Temp: 97 ??F (36.1 ??C)   SpO2: 97%   Weight: 66.7 kg (147 lb)            Physical Exam  Constitutional:       General: She is not in acute distress.     Appearance: She is well-developed.   HENT:      Head: Normocephalic.      Comments: There is no temporal artery tenderness, swelling or periorbital tenderness.  Eyes:      Extraocular Movements: Extraocular movements intact.      Conjunctiva/sclera: Conjunctivae normal.      Pupils: Pupils are equal, round, and reactive to light.   Neck:  Musculoskeletal: Normal range of motion.   Cardiovascular:      Rate and Rhythm: Normal rate.      Heart sounds: No murmur.   Pulmonary:      Effort: Pulmonary effort is normal.      Breath sounds: Normal breath sounds.   Abdominal:      Palpations: Abdomen is soft.      Tenderness: There is no abdominal tenderness.   Musculoskeletal: Normal range of motion.      Comments: The right leg is nontender, nonswollen and distal pulses are normal.   Skin:     General: Skin is warm and dry.      Capillary Refill: Capillary refill takes less than 2 seconds.   Neurological:       Mental Status: She is alert.   Psychiatric:         Behavior: Behavior normal.          MDM       Procedures

## 2019-11-30 NOTE — Telephone Encounter (Signed)
Called, left vm for pt to return call to office.

## 2019-12-02 NOTE — Telephone Encounter (Signed)
From: Duane Lope  To: [name], MD  Sent: 12/02/2019 10:45 AM EDT  Subject: Update Medical Information    I have been diagnosed with ocular Myasthenia Gravis by Dr. Neomia Dear. Meeekins, VEI. He has referred me to Dr. Imogene Burn, [facility] [address] [facility], for additional testing. My appoinment with Dr. Brayton Caves is on April 29th for an EMG test.

## 2019-12-02 NOTE — Telephone Encounter (Signed)
-----   Message from Renee Harder, LPN sent at [id number] 11:18 AM EDT -----  Regarding: FW: Update Medical Information  Contact: [phone]  Fyi.   ----- Message -----  From: Jillian Bean  Sent: 12/02/2019  10:44 AM EDT  To: [facility]  Subject: Update Medical Information                       I have been diagnosed with ocular Myasthenia Gravis by Dr. Neomia Dear. Meeekins, VEI.  He has referred me to Dr. Imogene Burn, [facility], for additional testing.  My appoinment with Dr. Brayton Caves is on April 29th for an EMG test.

## 2019-12-02 NOTE — Telephone Encounter (Signed)
Left a message thanking her for letting me know-keep  Me  posted

## 2019-12-10 ENCOUNTER — Ambulatory Visit: Admit: 2019-12-10 | Discharge: 2019-12-10 | Payer: MEDICARE | Primary: Internal Medicine

## 2019-12-10 ENCOUNTER — Ambulatory Visit: Primary: Internal Medicine

## 2019-12-10 DIAGNOSIS — G7000 Myasthenia gravis without (acute) exacerbation: Secondary | ICD-10-CM

## 2019-12-10 MED ORDER — PREDNISONE 20 MG TAB
20 mg | ORAL_TABLET | Freq: Every day | ORAL | 1 refills | Status: DC
Start: 2019-12-10 — End: 2020-01-19

## 2019-12-10 MED ORDER — PANTOPRAZOLE 40 MG TAB, DELAYED RELEASE
40 mg | ORAL_TABLET | Freq: Every day | ORAL | 1 refills | Status: DC
Start: 2019-12-10 — End: 2020-01-19

## 2019-12-10 NOTE — Progress Notes (Signed)
Progress Notes by Link Snuffer, MD at 12/10/19 1100                Author: Link Snuffer, MD  Service: --  Author Type: Physician       Filed: 12/10/19 1247  Encounter Date: 12/10/2019  Status: Signed          Editor: Link Snuffer, MD (Physician)                       [facility]   [address] [place], [place]   Phone [phone] Fax [phone]   Test Date:  12/10/2019             Patient:  Jillian Bean  DOB:  Jan 07, 1941  Physician:  Thana Ates, MD            Sex:  Female  Height:  5\' 5"   Ref Phys:  Dr, Lucy Antigua            ID#:  [id number]  Weight:  147 lbs.  Technician:  Barnabas Lister        Patient Complaints:   L side      Patient History / Exam:   79 year old female who is being evaluated for symptoms of diplopia, suspected to be due to myasthenia gravis.      On exam: Alert and fully oriented for cranial nerves II through XII intact.  Muscle tone and bulk normal.  Strength normal in both upper and lower extremities.  DTRs 2/2 and symmetric.  Toes downgoing.  Sensation intact.  Gait normal         NCV & EMG Findings:   Evaluation of the left median motor nerve showed reduced amplitude (3.7 mV).  The left ulnar sensory nerve showed prolonged distal peak latency (3.8 ms), reduced amplitude (12.2 ??V), and decreased conduction velocity (Wrist-5th Digit, 37 m/s).  All  remaining nerves  were within normal limits.        All F Wave latencies were within normal limits.        All examined muscles (as indicated in the following table) showed no evidence of electrical instability.        Impression:      The electrodiagnostic testing overall is unremarkable except for repetitive nerve stimulation study which was performed peformed on left  median nerve and accessory spinal nerve.    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    She was started on prednisone 20 mg daily along with pantoprazole.      ___________________________   Thana Ates, MD           Nerve Conduction Studies   Anti Sensory Summary Table                     Stim Site  NR  Peak (ms)  Norm Peak (ms)  P-T Amp (??V)  Norm P-T Amp  Onset (ms)  Site1  Site2  Delta-P (ms)  Dist (cm)  Vel (m/s)  Norm Vel (m/s)       Left Median Anti Sensory (2nd Digit)  30??C                   Wrist      3.6  <3.6  19.0  >10  2.8  Wrist  2nd Digit  3.6  14.0  39  >39       Left Radial Anti Sensory (Base 1st Digit)  30??C                   Wrist      2.2  <3.1  30.5    1.6  Wrist  Base 1st Digit  2.2  0.0           Left Sup Peroneal Anti Sensory (Ant Lat Mall)  30??C                   14 cm      2.8  <4.4  11.0  >5.0  2.1  14 cm  Ant Lat Mall  2.8  14.0  50  >32       Left Sural Anti Sensory (Lat Mall)  30??C                   Calf      3.7  <4.0  6.6  >5.0  3.1  Calf  Lat Mall  3.7  14.0  38  >35       Left Ulnar Anti Sensory (5th Digit)  30??C                   Wrist      3.8  <3.7  12.2  >15.0  3.2  Wrist  5th Digit  3.8  14.0  37  >38        Motor Summary Table                    Stim Site  NR  Onset (ms)  Norm Onset (ms)  O-P Amp (mV)  Norm O-P Amp  Site1  Site2  Delta-0 (ms)  Dist (cm)  Vel (m/s)  Norm Vel (m/s)       Left Median Motor (Abd Poll Brev)  30??C                  Wrist      3.6  <4.2  3.7  >5  Elbow  Wrist  3.3  18.0  55  >50                  Elbow      6.9    3.4                     Left Peroneal Motor (Ext Dig Brev)  30??C                  Ankle      4.5  <6.1  2.5  >2.5  B Fib  Ankle  6.6  30.0  45  >38     B Fib      11.1    2.1    Poplt  B  Fib  1.9  10.0  53  >40     Poplt      13.0    1.8                     Left Tibial Motor (  Abd [place])  30??C                  Ankle      3.8  <6.1  6.5  >3.0  Knee  Ankle  7.9  40.0  51  >35     Knee      11.7    5.3                     Left Ulnar Motor (Abd Dig [place])  30??C                  Wrist      3.0  <4.2  7.6  >3  B Elbow  Wrist  2.9  20.0  69  >53     B Elbow      5.9    7.1    A Elbow  B Elbow  1.6  10.0  63  >53                  A Elbow      7.5    7.0                      F Wave Studies             NR  F-Lat (ms)  Lat Norm (ms)  L-R F-Lat (ms)  L-R Lat Norm       Left Ulnar (Mrkrs) (Abd Dig Min)  30??C               27.48  <36    <2.5        EMG                     Side  Muscle  Nerve  Root  Ins Act  Fibs  Psw  Amp  Dur  Poly  Recrt  Int Pat  Comment                   Left  AntTibialis  Dp Br Peronel  L4-5  Nml  Nml  Nml  Nml  Nml  0  Nml  Nml       Left  Peroneus Long  Sup Br Peronel  L5-S1  Nml  Nml  Nml  Nml  Nml  0  Nml  Nml       Left  Gastroc  Tibial  S1-2  Nml  Nml  Nml  Nml  Nml  0  Nml  Nml       Left  Flex Dig Long  Tibial  L5-S2  Nml  Nml  Nml  Nml  Nml  0  Nml  Nml       Left  VastusLat  Femoral  L2-4  Nml  Nml  Nml  Nml  Nml  0  Nml  Nml       Left  1stDorInt  Ulnar  C8-T1  Nml  Nml  Nml  Nml  Nml  0  Nml  Nml       Left  BrachioRad  Radial  C5-6  Nml  Nml  Nml  Nml  Nml  0  Nml  Nml       Left  PronatorTeres  Median  C6-7  Nml  Nml  Nml  Nml  Nml  0  Nml  Nml       Left  Biceps  Musculocut  C5-6  Nml  Nml  Nml  Nml  Nml  0  Nml  Nml       Left  Triceps  Radial  C6-7-8  Nml  Nml  Nml  Nml  Nml  0  Nml  Nml                     Left  Deltoid  Axillary  C5-6  Nml  Nml  Nml  Nml  Nml  0  Nml  Nml          RNS                    Trial #  Label  Amp 1 (mV)  O-P  Amp 5 (mV)  O-P  Amp % Dif  Area 1 (mV??ms)  Area 5 (mV??ms)  Area % Dif  Rep Rate  Train Length  Pause Time (min:sec)  Comments       Left Abd Poll Brev                  Tr 1  Baseline  3.38  3.30  -2.4  10.77  10.40  -3.5  3.00  10   00:30       Tr 2  Post Exercise  3.51  3.42  -2.5  10.37  10.00  -3.6  3.00  10  01:00       Tr 3  1 min Post  3.47  3.38  -2.7  10.40  9.89  -4.9  3.00  10  01:00       Tr 4  2 min Post  3.40  3.32  -2.3  10.17  9.64  -5.2  3.00  10  01:00       Tr 6  3 min Post  3.64  3.47  -4.8  10.52  9.92  -5.7  3.00  10  00:00         Left Trapezius                  Tr 2  Baseline  1.36  1.43  5.2  6.98  6.60  -5.4  3.00  10  00:30       Tr 3  Post Exercise  0.57  0.06  -89.5  15.76  0.11  -99.3  3.00  10  01:00       Tr 4  1 min Post  1.81  1.30  -28.4  9.72  6.92  -28.9  3.00  10  01:00       Tr 5  2 min Post  1.35  1.36  0.7  8.10  6.70  -17.2  3.00  10  01:00                    Tr 6  3 min Post  0.09  0.12  43.0  0.00  0.00  0.0  3.00  10  00:00                              Waveforms:

## 2019-12-10 NOTE — Progress Notes (Signed)
Patient's records from a [facility]/Dr. Meekins reviewed.  She has positive acetylcholine receptor binding antibody 18.60 (high), acetylcholine receptor blocking antibody 33 (high), modulating antibody 48 (high), striation antibody negative, TSH and T3 normal, T4- 2.1 (high)

## 2019-12-10 NOTE — Progress Notes (Signed)
[facility]  [address] [place], [place]  Phone [phone] Fax [phone]  Test Date:  12/10/2019    Patient: Jillian Bean DOB: 1940-10-17 Physician: Thana Ates, MD   Sex: Female Height: 5\' 5"  Ref Phys: Dr, Lucy Antigua   ID#: [id number] Weight: 147 lbs. Technician: Barnabas Lister     Patient Complaints:  L side    Patient History / Exam:  79 year old female who is being evaluated for symptoms of diplopia, suspected to be due to myasthenia gravis.    On exam: Alert and fully oriented for cranial nerves II through XII intact.  Muscle tone and bulk normal.  Strength normal in both upper and lower extremities.  DTRs 2/2 and symmetric.  Toes downgoing.  Sensation intact.  Gait normal      NCV & EMG Findings:  Evaluation of the left median motor nerve showed reduced amplitude (3.7 mV).  The left ulnar sensory nerve showed prolonged distal peak latency (3.8 ms), reduced amplitude (12.2 ??V), and decreased conduction velocity (Wrist-5th Digit, 37 m/s).  All remaining nerves  were within normal limits.      All F Wave latencies were within normal limits.      All examined muscles (as indicated in the following table) showed no evidence of electrical instability.      Impression:    The electrodiagnostic testing overall is unremarkable except for repetitive nerve stimulation study which was performed peformed on left  median nerve and accessory spinal nerve.   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   She was started on  prednisone 20 mg daily along with pantoprazole.    ___________________________  Thana Ates, MD        Nerve Conduction Studies  Anti Sensory Summary Table     Stim Site NR Peak (ms) Norm Peak (ms) P-T Amp (??V) Norm P-T Amp Onset (ms) Site1 Site2 Delta-P (ms) Dist (cm) Vel (m/s) Norm Vel (m/s)   Left Median Anti Sensory (2nd Digit)  30??C   Wrist    3.6 <3.6 19.0 >10 2.8 Wrist 2nd Digit 3.6 14.0 39 >39   Left Radial Anti Sensory (Base 1st Digit)  30??C   Wrist    2.2 <3.1 30.5  1.6 Wrist Base 1st Digit 2.2 0.0     Left Sup Peroneal Anti Sensory (Ant Lat Mall)  30??C   14 cm    2.8 <4.4 11.0 >5.0 2.1 14 cm Ant Lat Mall 2.8 14.0 50 >32   Left Sural Anti Sensory (Lat Mall)  30??C   Calf    3.7 <4.0 6.6 >5.0 3.1 Calf Lat Mall 3.7 14.0 38 >35   Left Ulnar Anti Sensory (5th Digit)  30??C   Wrist    3.8 <3.7 12.2 >15.0 3.2 Wrist 5th Digit 3.8 14.0 37 >38     Motor Summary Table     Stim Site NR Onset (ms) Norm Onset (ms) O-P Amp (mV) Norm O-P Amp Site1 Site2 Delta-0 (ms) Dist (cm) Vel (m/s) Norm Vel (m/s)   Left Median Motor (Abd Poll Brev)  30??C   Wrist  3.6 <4.2 3.7 >5 Elbow Wrist 3.3 18.0 55 >50   Elbow    6.9  3.4          Left Peroneal Motor (Ext Dig Brev)  30??C   Ankle    4.5 <6.1 2.5 >2.5 B Fib Ankle 6.6 30.0 45 >38   B Fib    11.1  2.1  Poplt B Fib 1.9 10.0 53 >40   Poplt    13.0  1.8          Left Tibial Motor (Abd Hall Brev)  30??C   Ankle    3.8 <6.1 6.5 >3.0 Knee Ankle 7.9 40.0 51 >35   Knee    11.7  5.3          Left Ulnar Motor (Abd Dig Minimi)  30??C   Wrist    3.0 <4.2 7.6 >3 B Elbow Wrist 2.9 20.0 69 >53   B Elbow    5.9  7.1  A Elbow B Elbow 1.6 10.0 63 >53   A Elbow    7.5  7.0            F Wave Studies     NR F-Lat (ms) Lat Norm (ms) L-R F-Lat (ms) L-R Lat Norm   Left Ulnar (Mrkrs) (Abd Dig Min)  30??C      27.48 <36  <2.5     EMG     Side Muscle Nerve Root Ins Act Fibs Psw Amp Dur Poly Recrt Int Fraser Din Comment   Left AntTibialis Dp Br Peronel L4-5 Nml Nml Nml Nml Nml 0 Nml Nml    Left Peroneus Long Sup Br  Peronel L5-S1 Nml Nml Nml Nml Nml 0 Nml Nml    Left Gastroc Tibial S1-2 Nml Nml Nml Nml Nml 0 Nml Nml    Left Flex Dig Long Tibial L5-S2 Nml Nml Nml Nml Nml 0 Nml Nml    Left VastusLat Femoral L2-4 Nml Nml Nml Nml Nml 0 Nml Nml    Left 1stDorInt Ulnar C8-T1 Nml Nml Nml Nml Nml 0 Nml Nml    Left BrachioRad Radial C5-6 Nml Nml Nml Nml Nml 0 Nml Nml    Left PronatorTeres Median C6-7 Nml Nml Nml Nml Nml 0 Nml Nml    Left Biceps Musculocut C5-6 Nml Nml Nml Nml Nml 0 Nml Nml    Left Triceps Radial C6-7-8 Nml Nml Nml Nml Nml 0 Nml Nml    Left Deltoid Axillary C5-6 Nml Nml Nml Nml Nml 0 Nml Nml      RNS     Trial # Label Amp 1 (mV)  O-P Amp 5 (mV)  O-P Amp % Dif Area 1 (mV??ms) Area 5 (mV??ms) Area % Dif Rep Rate Train Length Pause Time (min:sec) Comments   Left Abd Poll Brev   Tr 1 Baseline 3.38 3.30 -2.4 10.77 10.40 -3.5 3.00 10 00:30    Tr 2 Post Exercise 3.51 3.42 -2.5 10.37 10.00 -3.6 3.00 10 01:00    Tr 3 1 min Post 3.47 3.38 -2.7 10.40 9.89 -4.9 3.00 10 01:00    Tr 4 2 min Post 3.40 3.32 -2.3 10.17 9.64 -5.2 3.00 10 01:00    Tr 6 3 min Post 3.64 3.47 -4.8 10.52 9.92 -5.7 3.00 10 00:00    Left Trapezius   Tr 2 Baseline 1.36 1.43 5.2 6.98 6.60 -5.4 3.00 10 00:30    Tr 3 Post Exercise 0.57 0.06 -89.5 15.76 0.11 -99.3 3.00 10 01:00    Tr 4  1 min Post 1.81 1.30 -28.4 9.72 6.92 -28.9 3.00 10 01:00    Tr 5 2 min Post 1.35 1.36 0.7 8.10 6.70 -17.2 3.00 10 01:00    Tr 6 3 min Post 0.09 0.12 43.0 0.00 0.00 0.0 3.00 10 00:00                  Waveforms:

## 2019-12-14 NOTE — Telephone Encounter (Signed)
I gave patient this information.

## 2019-12-14 NOTE — Telephone Encounter (Signed)
-----   Message from [facility] sent at 12/14/2019  8:59 AM EDT -----  Regarding: Dr. Gerald DexterPB:[id number]  General Message/Vendor Calls    Caller's first and last name:Pt      Reason for call:Pt would like a call back to discuss her medications.  Pt was prescribed prednisone however would like to know if she can take it with her mestinon prescription prescribed by Dr. Lucy Antigua from [facility].       Callback required yes/no and why:Yes, discuss medications.       Best contact number(s):[phone]        Details to clarify the request:n/a      Ricke Hey

## 2019-12-14 NOTE — Telephone Encounter (Signed)
-----   Message from [facility] sent at 12/14/2019  8:59 AM EDT -----  Regarding: Dr. Gerald DexterPB:[id number]  General Message/Vendor Calls    Caller's first and last name:Pt      Reason for call:Pt would like a call back to discuss her medications.  Pt was prescribed prednisone however would like to know if she can take it with her mestinon prescription prescribed by Dr. Lucy Antigua from [facility].       Callback required yes/no and why:Yes, discuss medications.       Best contact number(s):[phone]        Details to clarify the request:n/a      Ricke Hey

## 2019-12-14 NOTE — Telephone Encounter (Signed)
Per Dr. Brayton Caves, Yes, okay to take both of these medicines together.

## 2019-12-14 NOTE — Telephone Encounter (Signed)
Patient is on Mestinon 60mg  TID and Prednisone 20mg  every day and would like to know if it is ok to take these two meds together. Please advise.

## 2019-12-28 ENCOUNTER — Inpatient Hospital Stay: Admit: 2019-12-28 | Payer: MEDICARE | Primary: Internal Medicine

## 2019-12-28 ENCOUNTER — Ambulatory Visit: Admit: 2019-12-28 | Discharge: 2019-12-28 | Payer: MEDICARE | Attending: Specialist | Primary: Internal Medicine

## 2019-12-28 ENCOUNTER — Ambulatory Visit: Attending: Specialist | Primary: Internal Medicine

## 2019-12-28 DIAGNOSIS — Z853 Personal history of malignant neoplasm of breast: Secondary | ICD-10-CM

## 2019-12-28 DIAGNOSIS — C50911 Malignant neoplasm of unspecified site of right female breast: Secondary | ICD-10-CM

## 2019-12-28 LAB — POC CHEM8
ANION GAP ISTAT,IAGAP: 13 mmol/L (ref 10–20)
Anion gap (POC): 13 mmol/L (ref 10–20)
BUN (POC): 22 mg/dL — ABNORMAL HIGH (ref 9–20)
BUN ISTAT,IBUN: 22 mg/dL — ABNORMAL HIGH (ref 9–20)
CHLORIDE ISTAT,ICL: 108 mmol/L — ABNORMAL HIGH (ref 98–107)
CO2 (POC): 27 mmol/L (ref 21–32)
Calcium, ionized (POC): 1.27 mmol/L (ref 1.12–1.32)
Calcium, ionized, POC: 1.27 mmol/L (ref 1.12–1.32)
Chloride (POC): 108 mmol/L — ABNORMAL HIGH (ref 98–107)
Creatinine (POC): 1 mg/dL (ref 0.6–1.3)
GFR African American: >60 mL/min/{1.73_m2} (ref >60)
GFR Non-African American: 53 mL/min/{1.73_m2} — ABNORMAL LOW (ref >60)
GFRAA, POC: >60 mL/min/{1.73_m2} (ref >60)
GFRNA, POC: 53 mL/min/{1.73_m2} — ABNORMAL LOW (ref >60)
GLUCOSE ISTAT,IGLU: 72 mg/dL (ref 65–100)
Glucose (POC): 72 mg/dL (ref 65–100)
HEMATOCRIT ISTAT,IHCT: 37 % (ref 35.0–47.0)
Hematocrit (POC): 37 % (ref 35.0–47.0)
POC Creatinine: 1 mg/dL (ref 0.6–1.3)
POTASSIUM ISTAT,IK: 3.9 mmol/L (ref 3.5–5.1)
Potassium (POC): 3.9 mmol/L (ref 3.5–5.1)
SODIUM ISTAT,INA: 143 mmol/L (ref 136–145)
Sodium (POC): 143 mmol/L (ref 136–145)
TCO2 ISTAT,ITCO2: 27 mmol/L (ref 21–32)

## 2019-12-28 MED ORDER — SODIUM CHLORIDE 0.9 % IV
INTRAVENOUS | Status: AC
Start: 2019-12-28 — End: 2019-12-28
  Administered 2019-12-28: 16:00:00 via INTRAVENOUS

## 2019-12-28 MED ORDER — SODIUM CHLORIDE 0.9 % IV
4 mg/5 mL | Freq: Once | INTRAVENOUS | Status: AC
Start: 2019-12-28 — End: 2019-12-28
  Administered 2019-12-28: 16:00:00 via INTRAVENOUS

## 2019-12-28 MED FILL — ZOLEDRONIC ACID 4 MG/5 ML IV: 4 mg/5 mL | INTRAVENOUS | Qty: 4.13

## 2019-12-28 MED FILL — SODIUM CHLORIDE 0.9 % IV: INTRAVENOUS | Qty: 1000

## 2019-12-28 NOTE — Progress Notes (Signed)
[facility]  [address], [place]   [address]  W: [phone]   F: [phone]      F/u HEME/ONC CONSULT    Reason for visit: evaluation for treatment for breast cancer    Consulting physician: Dr. Gilford Rile    HPI:   Jillian Bean is a 79 y.o.  female who I was asked to see in consultation at the request of Dr. Elie Confer for evaluation for systemic therapy for breast cancer.    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 Right lumpectomy on 11/10/14 shows IDC, 1.7 cm, gr 3, 0/5 LN, no LVI.  PT1cN0Mx.    Mertens q 3 weeks x 6: 12/21/14- 04/12/15  C#5 held 1 week on 03/15/15 due to thrombocytopenia   Outback Herceptin: 05/03/15-11/29/15    S/p XRT 06/03/15    Bone Marrow Bx 10/09/16: Variably normocellular marrow with trilineage hematopoiesis.    Interval History: In today for follow up. Complains of gr 1 constipation, gr 1 diarrhea, gr 1 fatigue, gr 1 hair loss, gr 1 pain, gr 1 urinary frequency.  She was recently diagnosed with myasthenia gravis.     DX   Encounter Diagnoses   Name Primary?   ??? History of breast cancer Yes   ??? Osteoporosis without current pathological fracture, unspecified osteoporosis type    ??? Myasthenia gravis [facility])      Past Medical History:   Diagnosis Date   ??? Anemia    ??? Back pain 09/01/2007   ??? Cancer (HCC)     R breast   ??? Chronic pain     BACK PAIN   ??? Coagulation disorder ([place])     IRON DEFFICIENCY ANEMIA   ??? Colonic polyps 08/31/1998   ??? 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 DVT (deep venous thrombosis) ([place]) 01/2017   ??? Hiatal hernia 07/11/2011    Dr.sobieski   ??? Hypercholesteremia 09/01/2007   ??? Hypothyroidism 09/01/2003   ??? 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 Nausea & vomiting    ??? OA (osteoarthritis) 09/01/2007   ??? Other  ill-defined conditions(799.89)     VERTIGO   ??? Plantar fasciitis 09/01/2007   ??? S/P colonoscopy 10/10/2007   ??? Thromboembolus ([place]) 01/2017    RIGHT LEG   ??? Thyroid disease      Past Surgical History:   Procedure Laterality Date   ??? COLONOSCOPY N/A 11/12/2016    COLONOSCOPY performed by Burnett Harry, MD at [place]   ??? COLONOSCOPY,DIAGNOSTIC  11/12/2016        ??? ENDOSCOPY, COLON, DIAGNOSTIC  7/12/18/08    7/05 Dr Maudry Diego   ??? HX BREAST LUMPECTOMY Right 2016   ??? HX BREAST REDUCTION  1993   ??? HX CATARACT REMOVAL Right 01/21/2018   ??? HX CATARACT REMOVAL Left 03/11/2018   ??? HX COLONOSCOPY  2014    Dr Illene Silver   ??? HX GI      COLONOSCOPY   ??? HX HEENT  04/07/10    thyroid biopsy neg dr Leward Quan   ??? HX HEENT      wisdom teeth extraction   ??? HX HYSTERECTOMY  1975    PARTIAL   ??? HX KNEE  REPLACEMENT Left 08/22/09   ??? HX KNEE REPLACEMENT Right 08/13/12   ??? HX ORTHOPAEDIC Right 11/26/2016    cleaned infection in joint   ??? HX ORTHOPAEDIC Right 03/2017    revision total knee replacement   ??? HX OTHER SURGICAL  2013    MINIMALLY INVASIVE LUMBAR DECOMPRESSION   ??? NEUROLOGICAL PROCEDURE UNLISTED  2013    LUMBAR DECOMPRESSION   ??? UPPER GI ENDOSCOPY,BIOPSY  11/12/2016          Social History     Socioeconomic History   ??? Marital status: MARRIED     Spouse name: Not on file   ??? Number of children: Not on file   ??? Years of education: Not on file   ??? Highest education level: Not on file   Tobacco Use   ??? Smoking status: Never Smoker   ??? Smokeless tobacco: Never Used   Substance and Sexual Activity   ??? Alcohol use: No   ??? Drug use: No   ??? Sexual activity: Not Currently     Family History   Problem Relation Age of Onset   ??? Cancer Mother         Breast   ??? Hypertension Mother    ??? Heart Failure Father         MI   ??? Heart Attack Father    ??? Hypertension Father    ??? MS Brother    ??? Anesth Problems Neg Hx        Current Outpatient Medications   Medication Sig Dispense Refill   ??? pyridostigmine (MESTINON) 60 mg tablet Take 60 mg by mouth every six  (6) hours. 1/2 tab - q 6 hours while awake.     ??? predniSONE (DELTASONE) 20 mg tablet Take 20 mg by mouth daily. 30 Tab 1   ??? pantoprazole (PROTONIX) 40 mg tablet Take 1 Tab by mouth daily. 30 Tab 1   ??? colesevelam (WELCHOL) 625 mg tablet TAKE 4 TABLETS DAILY       *GLENMARK* 360 Tab 3   ??? BIOTIN PO Take  by mouth.     ??? ezetimibe (ZETIA) 10 mg tablet Take 1 Tab by mouth daily. 90 Tab 1   ??? levothyroxine (SYNTHROID) 125 mcg tablet Take 1 Tab by mouth six (6) days a week. Take 1.5 tabs by mouth on Sunday only 100 Tab 3   ??? naproxen sodium (ALEVE PO) Take 1 Tab by mouth daily as needed.     ??? multivitamin (ONE A DAY) tablet Take 1 Tab by mouth daily.     ??? cholecalciferol, vitamin D3, (VITAMIN D3 PO) Take 2,000 Units by mouth daily.     ??? LOW-DOSE ASPIRIN PO Take 162 mg by mouth daily. Taking 2 tab daily     ??? polyethylene glycol (MIRALAX) 17 gram packet Take 17 g by mouth as needed.     ??? docusate sodium (COLACE) 100 mg capsule Take 100 mg by mouth daily.       Allergies   Allergen Reactions   ??? Atorvastatin Myalgia   ??? Zocor [Simvastatin] Other (comments) and Diarrhea     Other reaction(s): Adverse reaction to substance   Myalgias     ??? Celebrex [Celecoxib] Other (comments)     Aggitation   ??? Gabapentin Other (comments)     FELT UNSTEADY ON MY FEET   ??? Levaquin [Levofloxacin] Palpitations   ??? Nexium [Esomeprazole Magnesium] Diarrhea   ??? Other Medication Swelling     Latanoprost  drops   ??? Oxycontin [Oxycodone] Itching   ??? Pravastatin Nausea and Vomiting   ??? Statins-Hmg-Coa Reductase Inhibitors Other (comments)     lymphacytic colitis     ??? Yellow Dye Hives       Review of Systems    A comprehensive review of systems was performed and all systems were negative except for HPI and for the symptom report form, reviewed and scanned in.    Objective:  Physical Exam:  Visit Vitals  BP 136/61   Pulse (!) 58   Temp 97.9 ??F (36.6 ??C) (Temporal)   Resp 18   Ht '5\' 5"'  (1.651 m)   Wt 146 lb (66.2 kg)   LMP 04/13/2010   SpO2 98%    BMI 24.30 kg/m??     General: No distress  Eyes: Anicteric sclerae  HENT: Atraumatic  Neck: Supple  Respiratory: Normal respiratory effort  CV: No peripheral edema  GI: nondistended  Skin: No rashes, ecchymoses, or petechiae  Psych: Alert, oriented, appropriate affect, normal judgment/insight      Diagnostic Imaging     12/16/14 TTE: EF 71%.  03/02/15 TTE: EF 68%  05/30/15 TTE :  EF 65%  07/21/15 TTE: EF 66%  10/06/15 TTE: EF 62%  05/23/16 TTE EF 60%    03/15/16 dexa  Findings:  ????  Fractures identified on Lateral scanogram:  None  ????  Femoral Neck:  Right  Bone mineral density (gm/cm2):? 0.649  % of peak bone mass: 63  % for age matched controls:? 86  T-score: -2.8  Z-score: -0.8  ????  Total Hip: Right  Bone mineral density (gm/cm2):  0.761  % of peak bone mass:   76  % for age matched controls:  98  T-score:   -2.0  Z-score:  -0.1  ??  Lumbar Spine:  L1-L4  Bone mineral density (gm/cm2):  1.211  % of peak bone mass:  101   % for age matched controls:  124  T-score:  0.1  Z-score:  2.0  ??  The T score for the left distal third radius is -1.4.  ????  IMPRESSION  Impression:  ????  This patient is osteoporotic using the World Health Organization criteria  As compared to the prior study, there has been no change in the bone mineral  density of the right total hip and an increase in the bone mineral density of  the lumbar spine of 2.0%.  10 year probability of major osteoporotic fracture:  19.6%  10 year probability of hip fracture:  7.4%    09/20/16 CT c/a/p:  IMPRESSION:  No acute process in the chest, abdomen, and pelvis.  ??    Lab Results  Lab Results   Component Value Date/Time    WBC 5.0 11/25/2019 08:53 AM    HGB 11.0 (L) 11/25/2019 08:53 AM    HCT 34.7 (L) 11/25/2019 08:53 AM    PLATELET 209 11/25/2019 08:53 AM    MCV 90.6 11/25/2019 08:53 AM     Lab Results   Component Value Date/Time    Sodium 140 11/25/2019 08:53 AM    Potassium 4.0 11/25/2019 08:53 AM    Chloride 112 (H) 11/25/2019 08:53 AM    CO2 26 11/25/2019 08:53 AM     Anion gap 2 (L) 11/25/2019 08:53 AM    Glucose 92 11/25/2019 08:53 AM    BUN 12 11/25/2019 08:53 AM    Creatinine 0.73 11/25/2019 08:53 AM    BUN/Creatinine ratio 16 11/25/2019 08:53 AM  GFR est AA >60 11/25/2019 08:53 AM    GFR est non-AA >60 11/25/2019 08:53 AM    Calcium 8.6 11/25/2019 08:53 AM    Alk. phosphatase 77 11/25/2019 08:53 AM    Protein, total 6.8 11/25/2019 08:53 AM    Albumin 4.0 11/25/2019 08:53 AM    Globulin 2.8 11/25/2019 08:53 AM    A-G Ratio 1.4 11/25/2019 08:53 AM    ALT (SGPT) 22 11/25/2019 08:53 AM       Lab Results   Component Value Date/Time    Reticulocyte count 1.5 09/05/2016 09:40 AM    Iron % saturation 18 07/22/2017 10:57 AM    TIBC 198 (L) 07/22/2017 10:57 AM    Ferritin 1,447 (H) 07/22/2017 10:57 AM    Vitamin B12 542 07/30/2016 09:31 AM    Folate 14.8 07/30/2016 09:31 AM    Haptoglobin 412 (H) 09/05/2016 09:40 AM    LD 192 09/05/2016 09:40 AM    Sed rate, automated 19 11/25/2019 08:53 AM    C-Reactive protein 21.70 (H) 11/28/2016 05:25 AM    TSH 0.54 09/08/2019 09:32 AM     Lab Results   Component Value Date/Time    INR 1.0 02/15/2017 03:11 PM    aPTT 28.7 08/18/2009 01:05 PM       Assessment/Plan:  79 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  PS 0    1. Right Breast cancer stage: IA    Hormonal therapy: not indicated due to receptor (-) status    We explained to the patient that the goal of systemic adjuvant therapy is to improve the chances for cure and decrease the risk of relapse. We explained why a patient can have microscopic cancer spread now even though physical examination, laboratory studies and imaging studies are negative for cancer. We explained that the same treatments used now as adjuvant or preventive treatments rarely if ever are curative in women who develop metastases.     No evidence of recurrence    Mammogram in Sept 17, 2020, benign, next due 04/2020.     She declined to take  neratinib.    2. Emotional well being: She has excellent support and is coping well with her disease.    3. FH of breast cancer: BRCA 1/2 [place] testing negative    4. Osteoporosis: On dexa from 2015; was on fosamax from Dr. Wanda Plump, started 08/2016, but stopped prior to surgery in 11/2016. DEXA 03/15/16 showed some improvement. Dr. Wanda Plump has recommended Reclast    We discussed the data from [place], [place] 2013, for the meta-analysis for adjuvant bisphosphonate use in breast cancer.  We discussed that in postmenopausal women, it appears that the bisphosphate zolendronic acid, given as in ABCSG 12 at 4 mg IV q 6 months x 3 years, is beneficial in improving bone DFS and OS.  We discussed side effects such as ONJ and the need to avoid invasive dental procedures while on these medications, renal damage, and hypocalcemia.    After this discussion, she is agreeable to zometa as above.  #1, 12/29/2018, #2 06/29/19, #3 12/28/19.    Repeat DEXA due 05/2020, ordered.     5. Back pain low/radiculopathy: Dr. Dema Severin, pain specialist, was following her. NM bone scan and xr show degeneration. Steroid injection in of Octobert; L-spine MRI 01/19/16 shows DDD.  States last steroid injection 09/24/16.   This has improved.     6. R  leg DVT: provoked, in non weight bearing leg s/p surgery. Reports she stopped Eliquis 08/2017.     7. Constipation:  Intermittent.  Stool softeners PRN.      8. Myasthenia Gravis:  On Mestinon and prednisone.     This patient was seen in conjunction with Henriette Combs, NP. I personally reviewed the history and all points in the assessment and plan with the patient, and performed key points on the exam. ER and PR negative, HER 2 + breast cancer, NED.  Continue zometa for osteoporosis, giving today.  RTC 6 months    Thank you for this consult.  All of the patient's questions were answered today.    Follow-up and Dispositions    ?? Return in about 6 months (around 06/29/2020) for 6 mo fu, cohen/Marshelle Bilger.         Sonda Rumble, MD

## 2019-12-28 NOTE — Progress Notes (Signed)
Ethelmae E Cofer is a 79 y.o. female Follow up for the evaluation of breast cancer.     1. Have you been to the ER, urgent care clinic since your last visit?  Hospitalized since your last visit? No    2. Have you seen or consulted any other health care providers outside of the [facility] since your last visit?  Include any pap smears or colon screening. No

## 2019-12-28 NOTE — Progress Notes (Signed)
 [facility]patient [facility]fusion [facility]   Visit Progress Note    [phone] Patient admitted to [facility] for Zometa in stable condition. Assessment completed. No new concerns voiced.     Pt denies having recent dental procedures in the past 4-6 weeks and states no plans for any in the near future. Pt verbalized understanding importance of notifying dentist of taking Zometa.      VS  Patient Vitals for the past 12 hrs:   Temp Pulse Resp BP SpO2   12/28/19 1202 -- 68 -- 125/61 --   12/28/19 0910 97.9 F (36.6 C) (!) 58 18 136/61 98 %         LAC PIV with positive blood return.     Medications:  Medications Administered     0.9% sodium chloride infusion     Admin Date  12/28/2019 Action  New Bag Dose  25 mL/hr Rate  25 mL/hr Route  IntraVENous Administered By  Mollie Rollene LABOR, RN          zoledronic acid (ZOMETA) 3.3 mg in 0.9% sodium chloride 100 mL IVPB     Admin Date  12/28/2019 Action  Given Dose  3.3 mg Route  IntraVENous Administered By  Mollie Rollene LABOR, RN                  [phone] Patient tolerated treatment well. Patient discharged from [facility] in no distress. Patient aware of next appointment.    Labs  Recent Results (from the past 12 hour(s))   POC CHEM8    Collection Time: 12/28/19  9:17 AM   Result Value Ref Range    Calcium, ionized (POC) 1.27 1.12 - 1.32 mmol/L    Sodium (POC) 143 136 - 145 mmol/L    Potassium (POC) 3.9 3.5 - 5.1 mmol/L    Chloride (POC) 108 (H) 98 - 107 mmol/L    CO2 (POC) 27 21 - 32 mmol/L    Anion gap (POC) 13 10 - 20 mmol/L    Glucose (POC) 72 65 - 100 mg/dL    BUN (POC) 22 (H) 9 - 20 mg/dL    Creatinine (POC) 8.99 0.6 - 1.3 mg/dL    GFRAA, POC >39 >39 fo/fpw/8.26f7    GFRNA, POC 53 (L) >60 ml/min/1.45m2    Hematocrit (POC) 37 35.0 - 47.0 %    Comment Comment Not Indicated.

## 2019-12-28 NOTE — Progress Notes (Signed)
Jillian Bean is a 79 y.o. female Follow up for the evaluation of breast cancer.     1. Have you been to the ER, urgent care clinic since your last visit?  Hospitalized since your last visit? No    2. Have you seen or consulted any other health care providers outside of the [facility] since your last visit?  Include any pap smears or colon screening. No

## 2019-12-28 NOTE — Progress Notes (Addendum)
Outpatient Infusion Center   Visit Progress Note    [phone] Patient admitted to [facility] for Zometa in stable condition. Assessment completed. No new concerns voiced.     Pt denies having recent dental procedures in the past 4-6 weeks and states no plans for any in the near future. Pt verbalized understanding importance of notifying dentist of taking Zometa.      VS  Patient Vitals for the past 12 hrs:   Temp Pulse Resp BP SpO2   12/28/19 1202 ??? 68 ??? 125/61 ???   12/28/19 0910 97.9 ??F (36.6 ??C) (!) 58 18 136/61 98 %         LAC PIV with positive blood return.     Medications:  Medications Administered     0.9% sodium chloride infusion     Admin Date  12/28/2019 Action  New Bag Dose  25 mL/hr Rate  25 mL/hr Route  IntraVENous Administered By  Oneida Arenas, RN          zoledronic acid (ZOMETA) 3.3 mg in 0.9% sodium chloride 100 mL IVPB     Admin Date  12/28/2019 Action  Given Dose  3.3 mg Route  IntraVENous Administered By  Oneida Arenas, RN                  [phone] Patient tolerated treatment well. Patient discharged from [place] in no distress. Patient aware of next appointment.    Labs  Recent Results (from the past 12 hour(s))   POC CHEM8    Collection Time: 12/28/19  9:17 AM   Result Value Ref Range    Calcium, ionized (POC) 1.27 1.12 - 1.32 mmol/L    Sodium (POC) 143 136 - 145 mmol/L    Potassium (POC) 3.9 3.5 - 5.1 mmol/L    Chloride (POC) 108 (H) 98 - 107 mmol/L    CO2 (POC) 27 21 - 32 mmol/L    Anion gap (POC) 13 10 - 20 mmol/L    Glucose (POC) 72 65 - 100 mg/dL    BUN (POC) 22 (H) 9 - 20 mg/dL    Creatinine (POC) 1.00 0.6 - 1.3 mg/dL    GFRAA, POC >60 >60 ml/min/1.48m2    GFRNA, POC 53 (L) >60 ml/min/1.11m2    Hematocrit (POC) 37 35.0 - 47.0 %    Comment Comment Not Indicated.

## 2019-12-28 NOTE — Progress Notes (Signed)
[facility]  [address], [place]   [address]  W: [phone]   F: [phone]      F/u HEME/ONC CONSULT    Reason for visit: evaluation for treatment for breast cancer    Consulting physician: Dr. Gilford Rile    HPI:   Jillian Bean is a 79 y.o.  female who I was asked to see in consultation at the request of Dr. Elie Confer for evaluation for systemic therapy for breast cancer.    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 Right lumpectomy on 11/10/14 shows IDC, 1.7 cm, gr 3, 0/5 LN, no LVI.  PT1cN0Mx.    [place] q 3 weeks x 6: 12/21/14- 04/12/15  C#5 held 1 week on 03/15/15 due to thrombocytopenia   [place] Herceptin: 05/03/15-11/29/15    S/p XRT 06/03/15    Bone Marrow Bx 10/09/16: Variably normocellular marrow with trilineage hematopoiesis.    Interval History: In today for follow up. Complains of gr 1 constipation, gr 1 diarrhea, gr 1 fatigue, gr 1 hair loss, gr 1 pain, gr 1 urinary frequency.  She was recently diagnosed with myasthenia gravis.     DX   Encounter Diagnoses   Name Primary?   ??? History of breast cancer Yes   ??? Osteoporosis without current pathological fracture, unspecified osteoporosis type    ??? Myasthenia gravis [facility])      Past Medical History:   Diagnosis Date   ??? Anemia    ??? Back pain 09/01/2007   ??? Cancer (HCC)     R breast   ??? Chronic pain     BACK PAIN   ??? Coagulation disorder ([place])     IRON DEFFICIENCY ANEMIA   ??? Colonic polyps 08/31/1998   ??? 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 DVT (deep venous thrombosis) ([place]) 01/2017   ??? Hiatal hernia 07/11/2011    Dr.sobieski   ??? Hypercholesteremia 09/01/2007   ??? Hypothyroidism 09/01/2003   ??? 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 Nausea & vomiting    ??? OA (osteoarthritis) 09/01/2007   ??? Other  ill-defined conditions(799.89)     VERTIGO   ??? Plantar fasciitis 09/01/2007   ??? S/P colonoscopy 10/10/2007   ??? Thromboembolus ([place]) 01/2017    RIGHT LEG   ??? Thyroid disease      Past Surgical History:   Procedure Laterality Date   ??? COLONOSCOPY N/A 11/12/2016    COLONOSCOPY performed by Burnett Harry, MD at [place]   ??? COLONOSCOPY,DIAGNOSTIC  11/12/2016        ??? ENDOSCOPY, COLON, DIAGNOSTIC  7/12/18/08    7/05 Dr Maudry Diego   ??? HX BREAST LUMPECTOMY Right 2016   ??? HX BREAST REDUCTION  1993   ??? HX CATARACT REMOVAL Right 01/21/2018   ??? HX CATARACT REMOVAL Left 03/11/2018   ??? HX COLONOSCOPY  2014    Dr Illene Silver   ??? HX GI      COLONOSCOPY   ??? HX HEENT  04/07/10    thyroid biopsy neg dr Leward Quan   ??? HX HEENT      wisdom teeth extraction   ??? HX HYSTERECTOMY  1975    PARTIAL   ??? HX KNEE  REPLACEMENT Left 08/22/09   ??? HX KNEE REPLACEMENT Right 08/13/12   ??? HX ORTHOPAEDIC Right 11/26/2016    cleaned infection in joint   ??? HX ORTHOPAEDIC Right 03/2017    revision total knee replacement   ??? HX OTHER SURGICAL  2013    MINIMALLY INVASIVE LUMBAR DECOMPRESSION   ??? NEUROLOGICAL PROCEDURE UNLISTED  2013    LUMBAR DECOMPRESSION   ??? UPPER GI ENDOSCOPY,BIOPSY  11/12/2016          Social History     Socioeconomic History   ??? Marital status: MARRIED     Spouse name: Not on file   ??? Number of children: Not on file   ??? Years of education: Not on file   ??? Highest education level: Not on file   Tobacco Use   ??? Smoking status: Never Smoker   ??? Smokeless tobacco: Never Used   Substance and Sexual Activity   ??? Alcohol use: No   ??? Drug use: No   ??? Sexual activity: Not Currently     Family History   Problem Relation Age of Onset   ??? Cancer Mother         Breast   ??? Hypertension Mother    ??? Heart Failure Father         MI   ??? Heart Attack Father    ??? Hypertension Father    ??? MS Brother    ??? Anesth Problems Neg Hx        Current Outpatient Medications   Medication Sig Dispense Refill   ??? pyridostigmine (MESTINON) 60 mg tablet Take 60 mg by mouth every six  (6) hours. 1/2 tab - q 6 hours while awake.     ??? predniSONE (DELTASONE) 20 mg tablet Take 20 mg by mouth daily. 30 Tab 1   ??? pantoprazole (PROTONIX) 40 mg tablet Take 1 Tab by mouth daily. 30 Tab 1   ??? colesevelam (WELCHOL) 625 mg tablet TAKE 4 TABLETS DAILY       *GLENMARK* 360 Tab 3   ??? BIOTIN PO Take  by mouth.     ??? ezetimibe (ZETIA) 10 mg tablet Take 1 Tab by mouth daily. 90 Tab 1   ??? levothyroxine (SYNTHROID) 125 mcg tablet Take 1 Tab by mouth six (6) days a week. Take 1.5 tabs by mouth on Sunday only 100 Tab 3   ??? naproxen sodium (ALEVE PO) Take 1 Tab by mouth daily as needed.     ??? multivitamin (ONE A DAY) tablet Take 1 Tab by mouth daily.     ??? cholecalciferol, vitamin D3, (VITAMIN D3 PO) Take 2,000 Units by mouth daily.     ??? LOW-DOSE ASPIRIN PO Take 162 mg by mouth daily. Taking 2 tab daily     ??? polyethylene glycol (MIRALAX) 17 gram packet Take 17 g by mouth as needed.     ??? docusate sodium (COLACE) 100 mg capsule Take 100 mg by mouth daily.       Allergies   Allergen Reactions   ??? Atorvastatin Myalgia   ??? Zocor [Simvastatin] Other (comments) and Diarrhea     Other reaction(s): Adverse reaction to substance   Myalgias     ??? Celebrex [Celecoxib] Other (comments)     Aggitation   ??? Gabapentin Other (comments)     FELT UNSTEADY ON MY FEET   ??? Levaquin [Levofloxacin] Palpitations   ??? Nexium [Esomeprazole Magnesium] Diarrhea   ??? Other Medication Swelling     Latanoprost  drops   ??? Oxycontin [Oxycodone] Itching   ??? Pravastatin Nausea and Vomiting   ??? Statins-Hmg-Coa Reductase Inhibitors Other (comments)     lymphacytic colitis     ??? Yellow Dye Hives       Review of Systems    A comprehensive review of systems was performed and all systems were negative except for HPI and for the symptom report form, reviewed and scanned in.    Objective:  Physical Exam:  Visit Vitals  BP 136/61   Pulse (!) 58   Temp 97.9 ??F (36.6 ??C) (Temporal)   Resp 18   Ht '5\' 5"'  (1.651 m)   Wt 146 lb (66.2 kg)   LMP 04/13/2010   SpO2 98%    BMI 24.30 kg/m??     General: No distress  Eyes: Anicteric sclerae  HENT: Atraumatic  Neck: Supple  Respiratory: Normal respiratory effort  CV: No peripheral edema  GI: nondistended  Skin: No rashes, ecchymoses, or petechiae  Psych: Alert, oriented, appropriate affect, normal judgment/insight      Diagnostic Imaging     12/16/14 TTE: EF 71%.  03/02/15 TTE: EF 68%  05/30/15 TTE :  EF 65%  07/21/15 TTE: EF 66%  10/06/15 TTE: EF 62%  05/23/16 TTE EF 60%    03/15/16 dexa  Findings:  ????  Fractures identified on Lateral scanogram:  None  ????  Femoral Neck:  Right  Bone mineral density (gm/cm2):? 0.649  % of peak bone mass: 63  % for age matched controls:? 86  T-score: -2.8  Z-score: -0.8  ????  Total Hip: Right  Bone mineral density (gm/cm2):  0.761  % of peak bone mass:   76  % for age matched controls:  98  T-score:   -2.0  Z-score:  -0.1  ??  Lumbar Spine:  L1-L4  Bone mineral density (gm/cm2):  1.211  % of peak bone mass:  101   % for age matched controls:  124  T-score:  0.1  Z-score:  2.0  ??  The T score for the left distal third radius is -1.4.  ????  IMPRESSION  Impression:  ????  This patient is osteoporotic using the World Health Organization criteria  As compared to the prior study, there has been no change in the bone mineral  density of the right total hip and an increase in the bone mineral density of  the lumbar spine of 2.0%.  10 year probability of major osteoporotic fracture:  19.6%  10 year probability of hip fracture:  7.4%    09/20/16 CT c/a/p:  IMPRESSION:  No acute process in the chest, abdomen, and pelvis.  ??    Lab Results  Lab Results   Component Value Date/Time    WBC 5.0 11/25/2019 08:53 AM    HGB 11.0 (L) 11/25/2019 08:53 AM    HCT 34.7 (L) 11/25/2019 08:53 AM    PLATELET 209 11/25/2019 08:53 AM    MCV 90.6 11/25/2019 08:53 AM     Lab Results   Component Value Date/Time    Sodium 140 11/25/2019 08:53 AM    Potassium 4.0 11/25/2019 08:53 AM    Chloride 112 (H) 11/25/2019 08:53 AM    CO2 26 11/25/2019 08:53 AM     Anion gap 2 (L) 11/25/2019 08:53 AM    Glucose 92 11/25/2019 08:53 AM    BUN 12 11/25/2019 08:53 AM    Creatinine 0.73 11/25/2019 08:53 AM    BUN/Creatinine ratio 16 11/25/2019 08:53 AM  GFR est AA >60 11/25/2019 08:53 AM    GFR est non-AA >60 11/25/2019 08:53 AM    Calcium 8.6 11/25/2019 08:53 AM    Alk. phosphatase 77 11/25/2019 08:53 AM    Protein, total 6.8 11/25/2019 08:53 AM    Albumin 4.0 11/25/2019 08:53 AM    Globulin 2.8 11/25/2019 08:53 AM    A-G Ratio 1.4 11/25/2019 08:53 AM    ALT (SGPT) 22 11/25/2019 08:53 AM       Lab Results   Component Value Date/Time    Reticulocyte count 1.5 09/05/2016 09:40 AM    Iron % saturation 18 07/22/2017 10:57 AM    TIBC 198 (L) 07/22/2017 10:57 AM    Ferritin 1,447 (H) 07/22/2017 10:57 AM    Vitamin B12 542 07/30/2016 09:31 AM    Folate 14.8 07/30/2016 09:31 AM    Haptoglobin 412 (H) 09/05/2016 09:40 AM    LD 192 09/05/2016 09:40 AM    Sed rate, automated 19 11/25/2019 08:53 AM    C-Reactive protein 21.70 (H) 11/28/2016 05:25 AM    TSH 0.54 09/08/2019 09:32 AM     Lab Results   Component Value Date/Time    INR 1.0 02/15/2017 03:11 PM    aPTT 28.7 08/18/2009 01:05 PM       Assessment/Plan:  79 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  PS 0    1. Right Breast cancer stage: IA    Hormonal therapy: not indicated due to receptor (-) status    We explained to the patient that the goal of systemic adjuvant therapy is to improve the chances for cure and decrease the risk of relapse. We explained why a patient can have microscopic cancer spread now even though physical examination, laboratory studies and imaging studies are negative for cancer. We explained that the same treatments used now as adjuvant or preventive treatments rarely if ever are curative in women who develop metastases.     No evidence of recurrence    Mammogram in Sept 17, 2020, benign, next due 04/2020.     She declined to take neratinib.     2. Emotional well being: She has excellent support and is coping well with her disease.    3. FH of breast cancer: BRCA 1/2 [place] testing negative    4. Osteoporosis: On dexa from 2015; was on fosamax from Dr. Wanda Plump, started 08/2016, but stopped prior to surgery in 11/2016. DEXA 03/15/16 showed some improvement. Dr. Wanda Plump has recommended Reclast    We discussed the data from [place], [place] 2013, for the meta-analysis for adjuvant bisphosphonate use in breast cancer.  We discussed that in postmenopausal women, it appears that the bisphosphate zolendronic acid, given as in ABCSG 12 at 4 mg IV q 6 months x 3 years, is beneficial in improving bone DFS and OS.  We discussed side effects such as ONJ and the need to avoid invasive dental procedures while on these medications, renal damage, and hypocalcemia.    After this discussion, she is agreeable to zometa as above.  #1, 12/29/2018, #2 06/29/19, #3 12/28/19.    Repeat DEXA due 05/2020, ordered.     5. Back pain low/radiculopathy: Dr. Dema Severin, pain specialist, was following her. NM bone scan and xr show degeneration. Steroid injection in of Octobert; L-spine MRI 01/19/16 shows DDD.  States last steroid injection 09/24/16.   This has improved.     6. R  leg DVT: provoked, in non weight bearing leg s/p surgery. Reports she stopped Eliquis 08/2017.     7. Constipation:  Intermittent.  Stool softeners PRN.      8. Myasthenia Gravis:  On Mestinon and prednisone.     This patient was seen in conjunction with Henriette Combs, NP. I personally reviewed the history and all points in the assessment and plan with the patient, and performed key points on the exam. ER and PR negative, HER 2 + breast cancer, NED.  Continue zometa for osteoporosis, giving today.  RTC 6 months    Thank you for this consult.  All of the patient's questions were answered today.    Follow-up and Dispositions    ?? Return in about 6 months (around 06/29/2020) for 6 mo fu, cohen/Ray[facility].         Sonda Rumble, MD

## 2020-01-19 ENCOUNTER — Ambulatory Visit: Admit: 2020-01-19 | Payer: MEDICARE | Attending: Neurology | Primary: Internal Medicine

## 2020-01-19 ENCOUNTER — Ambulatory Visit: Attending: Neurology | Primary: Internal Medicine

## 2020-01-19 DIAGNOSIS — G7000 Myasthenia gravis without (acute) exacerbation: Secondary | ICD-10-CM

## 2020-01-19 MED ORDER — PANTOPRAZOLE 40 MG TAB, DELAYED RELEASE
40 mg | ORAL_TABLET | Freq: Every day | ORAL | 2 refills | Status: DC
Start: 2020-01-19 — End: 2020-02-05

## 2020-01-19 MED ORDER — MYCOPHENOLATE 500 MG TAB
500 mg | ORAL_TABLET | Freq: Two times a day (BID) | ORAL | 2 refills | Status: DC
Start: 2020-01-19 — End: 2020-02-15

## 2020-01-19 MED ORDER — PREDNISONE 20 MG TAB
20 mg | ORAL_TABLET | Freq: Every day | ORAL | 2 refills | Status: DC
Start: 2020-01-19 — End: 2020-02-05

## 2020-01-19 NOTE — Progress Notes (Signed)
Chief Complaint   Patient presents with   ??? Neurologic Problem       HISTORY OF PRESENT ILLNESS  Jillian Bean is a 79 y.o. female who was recently seen by me for EMG procedure.  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  She tested strongly positive for acetylcholine receptor antibodies and was sent for neurological evaluation.  Denies any generalized muscle weakness, shortness of breath, exercise intolerance or limitations with activities of daily living.  She was started on prednisone 20 mg daily which has cleared up the double vision.  Also taking pyridostigmine 60 mg 3 times daily but is not sure if it is doing anything.  Came in to discuss further treatment options.     Past Medical History:   Diagnosis Date   ??? Anemia    ??? Back pain 09/01/2007   ??? Cancer (HCC)     R breast   ??? Chronic pain     BACK PAIN   ??? Coagulation disorder ([place])     IRON DEFFICIENCY ANEMIA   ??? Colonic polyps 08/31/1998   ??? 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 DVT (deep venous thrombosis) ([place]) 01/2017   ??? Hiatal hernia 07/11/2011    Dr.sobieski   ??? Hypercholesteremia 09/01/2007   ??? Hypothyroidism 09/01/2003   ??? 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 Nausea & vomiting    ??? OA (osteoarthritis) 09/01/2007   ??? Other ill-defined conditions(799.89)     VERTIGO   ??? Plantar fasciitis 09/01/2007   ??? S/P colonoscopy 10/10/2007   ??? Thromboembolus ([place]) 01/2017    RIGHT LEG   ??? Thyroid disease      Current Outpatient Medications   Medication Sig   ??? mycophenolate (CELLCEPT) 500 mg tablet Take 1 Tablet by mouth two (2) times a day.   ??? predniSONE (DELTASONE) 20 mg tablet Take 20 mg by mouth daily.   ??? pantoprazole (PROTONIX) 40 mg tablet Take 1 Tablet by mouth daily.   ??? pyridostigmine (MESTINON) 60 mg tablet Take 60 mg by mouth every six (6)  hours. 1/2 tab - q 6 hours while awake.   ??? colesevelam (WELCHOL) 625 mg tablet TAKE 4 TABLETS DAILY       *GLENMARK*   ??? ezetimibe (ZETIA) 10 mg tablet Take 1 Tab by mouth daily.   ??? levothyroxine (SYNTHROID) 125 mcg tablet Take 1 Tab by mouth six (6) days a week. Take 1.5 tabs by mouth on Sunday only   ??? multivitamin (ONE A DAY) tablet Take 1 Tab by mouth daily.   ??? cholecalciferol, vitamin D3, (VITAMIN D3 PO) Take 4,000 Units by mouth daily.   ??? LOW-DOSE ASPIRIN PO Take 162 mg by mouth daily. Taking 2 tab daily   ??? docusate sodium (COLACE) 100 mg capsule Take 100 mg by mouth daily.     No current facility-administered medications for this visit.     Allergies   Allergen Reactions   ??? Atorvastatin Myalgia   ??? Zocor [Simvastatin] Other (comments) and Diarrhea     Other reaction(s): Adverse reaction to substance   Myalgias     ??? Celebrex [Celecoxib] Other (comments)     Aggitation   ??? Gabapentin Other (comments)     FELT UNSTEADY ON MY FEET   ??? Levaquin [Levofloxacin] Palpitations   ???  Nexium [Esomeprazole Magnesium] Diarrhea   ??? Other Medication Swelling     Latanoprost drops   ??? Oxycontin [Oxycodone] Itching   ??? Pravastatin Nausea and Vomiting   ??? Statins-Hmg-Coa Reductase Inhibitors Other (comments)     lymphacytic colitis     ??? Yellow Dye Hives     Family History   Problem Relation Age of Onset   ??? Cancer Mother         Breast   ??? Hypertension Mother    ??? Heart Failure Father         MI   ??? Heart Attack Father    ??? Hypertension Father    ??? MS Brother    ??? Anesth Problems Neg Hx      Social History     Tobacco Use   ??? Smoking status: Never Smoker   ??? Smokeless tobacco: Never Used   Substance Use Topics   ??? Alcohol use: No   ??? Drug use: No     Past Surgical History:   Procedure Laterality Date   ??? COLONOSCOPY N/A 11/12/2016    COLONOSCOPY performed by Burnett Harry, MD at [place]   ??? COLONOSCOPY,DIAGNOSTIC  11/12/2016        ??? ENDOSCOPY, COLON, DIAGNOSTIC  7/12/18/08    7/05 Dr Maudry Diego   ??? HX BREAST LUMPECTOMY  Right 2016   ??? HX BREAST REDUCTION  1993   ??? HX CATARACT REMOVAL Right 01/21/2018   ??? HX CATARACT REMOVAL Left 03/11/2018   ??? HX COLONOSCOPY  2014    Dr Illene Silver   ??? HX GI      COLONOSCOPY   ??? HX HEENT  04/07/10    thyroid biopsy neg dr Leward Quan   ??? HX HEENT      wisdom teeth extraction   ??? HX HYSTERECTOMY  1975    PARTIAL   ??? HX KNEE REPLACEMENT Left 08/22/09   ??? HX KNEE REPLACEMENT Right 08/13/12   ??? HX ORTHOPAEDIC Right 11/26/2016    cleaned infection in joint   ??? HX ORTHOPAEDIC Right 03/2017    revision total knee replacement   ??? HX OTHER SURGICAL  2013    MINIMALLY INVASIVE LUMBAR DECOMPRESSION   ??? NEUROLOGICAL PROCEDURE UNLISTED  2013    LUMBAR DECOMPRESSION   ??? UPPER GI ENDOSCOPY,BIOPSY  11/12/2016              REVIEW OF SYSTEMS  Review of Systems - History obtained from the patient  Psychological ROS: negative  ENT ROS: negative  Hematological and Lymphatic ROS: negative  Endocrine ROS: negative  Respiratory ROS: no cough, shortness of breath, or wheezing  Cardiovascular ROS: no chest pain or dyspnea on exertion  Gastrointestinal ROS: no abdominal pain, change in bowel habits, or black or bloody stools  Genito-Urinary ROS: no dysuria, trouble voiding, or hematuria  Musculoskeletal ROS: negative  Dermatological ROS: negative      PHYSICAL EXAMINATION:    Visit Vitals  BP 122/78   Pulse 74   Resp 16   Ht 5\' 5"  (1.651 m)   Wt 146 lb (66.2 kg)   SpO2 98%   BMI 24.30 kg/m??     General:  Well nourished and groomed individual in no acute distress.    Neck: Supple, nontender, no bruits, no pain with resistance to active range of motion.    Heart: Regular rate and rhythm.  Normal S1S2.  Lungs:  Equal chest expansion, no cough, no wheeze  Musculoskeletal:  Extremities revealed  no edema and had full range of motion of joints.    Psych:  Good mood and bright affect    NEUROLOGICAL EXAMINATION:     Mental Status:   Alert and oriented to person, place, and time with recent and remote memory intact.  Attention span and  concentration are normal. Speech is fluent.      Cranial Nerves:    II, III, IV, VI:  Visual acuity grossly intact. Visual fields are normal.    Pupils are equal, round, and reactive to light and accommodation.    Extra-ocular movements are full and fluid.  Fundoscopic exam was benign, no ptosis or nystagmus.   V-XII: Hearing is grossly intact.  Facial features are symmetric, with normal sensation and strength.  The palate rises symmetrically and the tongue protrudes midline.  Sternocleidomastoids 5/5.      Motor Examination: Normal tone, bulk, and strength. 5/5 muscle strength throughout.  No cogwheel rigidity or clonus present.      Sensory exam:  Normal throughout to pinprick, temperature, and vibration sense.  Normal proprioception.      Coordination:  Finger to nose and rapid arm movement testing was normal.   No resting or intention tremor    Gait and Station:  Steady while walking on toes, heels, and with tandem walking.  Normal arm swing.  No Rhomberg or pronator drift.   No muscle wasting or fasiculations noted.      Reflexes:  DTRs 2+ throughout.  Toes downgoing.        LABS / IMAGING  CT Results (most recent):  Results from Hospital Encounter encounter on 11/25/19    CT HEAD WO CONT    Narrative  EXAM: CT HEAD WO CONT    INDICATION: Pain in the left eye and headache.    COMPARISON: 10/09/2019.    CONTRAST: None.    TECHNIQUE: Unenhanced CT of the head was performed using 5 mm images. Brain and  bone windows were generated. Coronal and sagittal reformats. CT dose reduction  was achieved through use of a standardized protocol tailored for this  examination and automatic exposure control for dose modulation.    FINDINGS:  The ventricles and sulci are normal in size, shape and configuration.. There is  no significant white matter disease. There is no intracranial hemorrhage,  extra-axial collection, or mass effect. The basilar cisterns are open. No CT  evidence of acute infarct.    The bone windows demonstrate  no abnormalities. The visualized portions of the  paranasal sinuses and mastoid air cells are clear.    Impression  No acute intracranial process identified by noncontrast CT    CT chest no evidence of thymic mass    Positive acetylcholine receptor binding antibody 18.60 (high)  Acetylcholine receptor blocking antibody 33 (high)  Modulating antibody 48 (high), striation antibody negative  TSH and T3 normal, T4- 2.1 (high)      ASSESSMENT    ICD-10-CM ICD-9-CM    1. 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  May try to lower the dose to 30 mg 3 times daily  to see if there is any change in her symptoms  We will add on 500 mg CellCept twice daily and will eventually try to wean down on prednisone after about 3 months or so  Side effect profile of this medication including GI symptoms, risk for infections was reviewed  Continue pantoprazole  Follow periodically      Link Snuffer, MD  Diplomate, American Board of Psychiatry & Neurology (Neurology)  Diplomate, American Board of Psychiatry & Neurology (Clinical Neurophysiology)  Diplomate, American Board of Electrodiagnostic Medicine

## 2020-01-19 NOTE — Progress Notes (Signed)
Jillian Bean presents as a new patient for evaluation of EMG findings. Depression screening done on patient.

## 2020-02-05 ENCOUNTER — Encounter

## 2020-02-05 MED ORDER — PANTOPRAZOLE 40 MG TAB, DELAYED RELEASE
40 mg | ORAL_TABLET | Freq: Every day | ORAL | 2 refills | Status: DC
Start: 2020-02-05 — End: 2020-02-17

## 2020-02-05 MED ORDER — PREDNISONE 20 MG TAB
20 mg | ORAL_TABLET | ORAL | 2 refills | Status: DC
Start: 2020-02-05 — End: 2020-05-30

## 2020-02-15 ENCOUNTER — Encounter

## 2020-02-15 MED ORDER — MYCOPHENOLATE 500 MG TAB
500 mg | ORAL_TABLET | ORAL | 2 refills | Status: DC
Start: 2020-02-15 — End: 2020-07-22

## 2020-02-17 ENCOUNTER — Encounter

## 2020-02-17 MED ORDER — PANTOPRAZOLE 40 MG TAB, DELAYED RELEASE
40 mg | ORAL_TABLET | Freq: Every day | ORAL | 1 refills | Status: DC
Start: 2020-02-17 — End: 2020-05-17

## 2020-03-03 ENCOUNTER — Encounter

## 2020-03-03 MED ORDER — EZETIMIBE 10 MG TAB
10 mg | ORAL_TABLET | ORAL | 1 refills | Status: DC
Start: 2020-03-03 — End: 2020-07-20

## 2020-03-08 ENCOUNTER — Ambulatory Visit: Admit: 2020-03-08 | Payer: MEDICARE | Attending: Internal Medicine | Primary: Internal Medicine

## 2020-03-08 ENCOUNTER — Ambulatory Visit: Attending: Internal Medicine | Primary: Internal Medicine

## 2020-03-08 DIAGNOSIS — G7000 Myasthenia gravis without (acute) exacerbation: Secondary | ICD-10-CM

## 2020-03-08 DIAGNOSIS — E785 Hyperlipidemia, unspecified: Secondary | ICD-10-CM

## 2020-03-08 NOTE — Progress Notes (Signed)
Message sent about labs

## 2020-03-08 NOTE — Progress Notes (Signed)
HISTORY OF PRESENT ILLNESS  Jillian Bean is a 79 y.o. female.  HPI  Comes in for med check and wellness review.  Since our last visit she has been diagnosed with ocular myasthenia gravis, seeing Dr. Lucy Antigua and Dr. Brayton Caves.  Now on Mestinon, Mycophenolate and prednisone 20 a day, as well as Protonix.  Her double vision has improved, she is tolerating the steroids well.  She is on Zometa infusions every six months for bones and will have a bone density in October.  We discussed monitoring for steroid induced diabetes.    Thyroid.  Remains on 0.125 mcg Synthroid, 1.5 tablets on Sundays.    Dyslipidemia.  Remains on Zetia and Welchol and has had no cardiac issues.      Review of Systems   Constitutional: Negative for chills, fever, malaise/fatigue and weight loss.   Eyes: Negative for blurred vision.   Respiratory: Negative for cough, shortness of breath and wheezing.    Cardiovascular: Negative for chest pain, palpitations, orthopnea, leg swelling and PND.   Gastrointestinal: Negative for abdominal pain, heartburn, nausea and vomiting.   Musculoskeletal: Negative for falls and myalgias.   Neurological: Negative for dizziness and headaches.   Psychiatric/Behavioral: Negative for depression and memory loss.       Physical Exam  Vitals and nursing note reviewed.   Constitutional:       Appearance: She is well-developed.   HENT:      Head: Normocephalic and atraumatic.   Neck:      Thyroid: No thyromegaly.      Vascular: No carotid bruit.   Cardiovascular:      Rate and Rhythm: Normal rate and regular rhythm.      Heart sounds: Normal heart sounds, S1 normal and S2 normal. No murmur heard.     Pulmonary:      Effort: Pulmonary effort is normal. No respiratory distress.      Breath sounds: Normal breath sounds. No wheezing or rales.   Musculoskeletal:      Cervical back: Normal range of motion and neck supple.      Right lower leg: No edema.      Left lower leg: No edema.   Neurological:      Mental Status: She is alert  and oriented to person, place, and time.   Psychiatric:         Behavior: Behavior normal.         ASSESSMENT and PLAN  Diagnoses and all orders for this visit:    1. Myasthenia gravis (HCC)-ocular being followed closely bu neuro and  Neuro ophthalmology  On steroids will monitor for steroid induced diabetes  2. Hypothyroidism due to acquired atrophy of thyroid  -     TSH 3RD GENERATION; Future  -     T4, FREE; Future    3. Dyslipidemia, goal LDL below 595  -     METABOLIC PANEL, COMPREHENSIVE; Future  -     LIPID PANEL; Future    4. Current chronic use of systemic steroids  -     HEMOGLOBIN A1C WITH EAG; Future  -     CBC WITH AUTOMATED DIFF; Future    5. Medicare annual wellness visit, subsequent    6. Encounter for immunization  -     PNEUMOCOCCAL POLYSACCHARIDE VACCINE, 23-VALENT, ADULT OR IMMUNOSUPPRESSED PT DOSE,    This is the Subsequent Medicare Annual Wellness Exam, performed 12 months or more after the Initial AWV or the last Subsequent AWV  I have reviewed the patient's medical history in detail and updated the computerized patient record.       Assessment/Plan   Education and counseling provided:  Are appropriate based on today's review and evaluation  Pneumococcal Vaccine  Influenza Vaccine  Bone mass measurement (DEXA)  Screening for glaucoma    1. Myasthenia gravis ([place])  2. Hypothyroidism due to acquired atrophy of thyroid  -     TSH 3RD GENERATION; Future  -     T4, FREE; Future  3. Dyslipidemia, goal LDL below 563  -     METABOLIC PANEL, COMPREHENSIVE; Future  -     LIPID PANEL; Future  4. Current chronic use of systemic steroids  -     HEMOGLOBIN A1C WITH EAG; Future  -     CBC WITH AUTOMATED DIFF; Future  5. Medicare annual wellness visit, subsequent  6. Encounter for immunization  -     PNEUMOCOCCAL POLYSACCHARIDE VACCINE, 23-VALENT, ADULT OR IMMUNOSUPPRESSED PT DOSE,       Depression Risk Factor Screening     3 most recent PHQ Screens 03/08/2020   Little interest or pleasure in doing things  Not at all   Feeling down, depressed, irritable, or hopeless Not at all   Total Score PHQ 2 0       Alcohol Risk Screen    Do you average more than 1 drink per night or more than 7 drinks a week:  No    On any one occasion in the past three months have you have had more than 3 drinks containing alcohol:  No        Functional Ability and Level of Safety    Hearing: Hearing is good.      Activities of Daily Living:  The home contains: no safety equipment.  Patient does total self care      Ambulation: with no difficulty     Fall Risk:  Fall Risk Assessment, last 12 mths 03/08/2020   Able to walk? Yes   Fall in past 12 months? 0   Do you feel unsteady? 0   Are you worried about falling 0   Number of falls in past 12 months -   Fall with injury? -      Abuse Screen:  Patient is not abused       Cognitive Screening    Has your family/caregiver stated any concerns about your memory: no     Cognitive Screening: Normal - Verbal Fluency Test    Health Maintenance Due     Health Maintenance Due   Topic Date Due   ??? Medicare Yearly Exam  10/16/2019   ??? Pneumococcal 65+ years (2 of 2 - PPSV23) 10/16/2019       Patient Care Team   Patient Care Team:  [name], MD as PCP - General  Wanda Plump Lowry Bowl, MD as PCP - [facility] Empaneled Provider  Telford Nab, MD (Cardiology)  Joyce Gross, MD as Physician (Hematology and Oncology)  Deal, Brien Few, MD as Surgeon (General Surgery)  Lyndel Safe, MD as Physician (Radiation Oncology)  Blair Hailey, MD (Infectious Disease)  Leanora Ivanoff, MD (Ophthalmology)    History     Patient Active Problem List   Diagnosis Code   ??? Hypercholesteremia E78.00   ??? OA (osteoarthritis) M19.90   ??? Back pain M54.9   ??? Plantar fasciitis M72.2   ??? Colonic polyps    ??? S/P colonoscopy Z98.890   ???  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 Iron deficiency anemia D50.9   ??? Right knee  DJD M17.11   ??? Hypothyroidism E03.9   ??? History of malignant neoplasm of right breast Z85.3   ??? Advanced care planning/counseling discussion Z71.89   ??? 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 S/P hysterectomy Z90.710   ??? Glaucoma suspect H40.009   ??? 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 Nuclear sclerotic cataract H25.10   ??? Posterior vitreous detachment H43.819   ??? Primary open angle glaucoma (POAG) H40.1190   ??? Myasthenia gravis ([place]) G70.00     Past Medical History:   Diagnosis Date   ??? Anemia    ??? Back pain 09/01/2007   ??? Cancer (HCC)     R breast   ??? Chronic pain     BACK PAIN   ??? Coagulation disorder ([place])     IRON DEFFICIENCY ANEMIA   ??? Colonic polyps 08/31/1998   ??? 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 DVT (deep venous thrombosis) ([place]) 01/2017   ??? Hiatal hernia 07/11/2011    Dr.sobieski   ??? Hypercholesteremia 09/01/2007   ??? Hypothyroidism 09/01/2003   ??? 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 Nausea & vomiting    ??? OA (osteoarthritis) 09/01/2007   ??? Other ill-defined conditions(799.89)     VERTIGO   ??? Plantar fasciitis 09/01/2007   ??? S/P colonoscopy 10/10/2007   ??? Thromboembolus ([place]) 01/2017    RIGHT LEG   ??? Thyroid disease       Past Surgical History:   Procedure Laterality Date   ??? COLONOSCOPY N/A 11/12/2016    COLONOSCOPY performed by Burnett Harry, MD at [facility]   ??? COLONOSCOPY,DIAGNOSTIC  11/12/2016        ??? ENDOSCOPY, COLON, DIAGNOSTIC  7/12/18/08    7/05 Dr Maudry Diego   ??? HX BREAST LUMPECTOMY Right 2016   ??? HX BREAST REDUCTION  1993   ??? HX CATARACT REMOVAL Right 01/21/2018   ??? HX CATARACT REMOVAL Left 03/11/2018   ??? HX COLONOSCOPY  2014    Dr Illene Silver   ??? HX GI      COLONOSCOPY   ??? HX HEENT  04/07/10    thyroid biopsy neg dr Leward Quan   ??? HX HEENT      wisdom teeth extraction   ??? HX HYSTERECTOMY  1975    PARTIAL   ??? HX  KNEE REPLACEMENT Left 08/22/09   ??? HX KNEE REPLACEMENT Right 08/13/12   ??? HX ORTHOPAEDIC Right 11/26/2016    cleaned infection in joint   ??? HX ORTHOPAEDIC Right 03/2017    revision total knee replacement   ??? HX OTHER SURGICAL  2013    MINIMALLY INVASIVE LUMBAR DECOMPRESSION   ??? NEUROLOGICAL PROCEDURE UNLISTED  2013    LUMBAR DECOMPRESSION   ??? UPPER GI ENDOSCOPY,BIOPSY  11/12/2016          Current Outpatient Medications   Medication Sig Dispense Refill   ??? ezetimibe (ZETIA) 10 mg tablet TAKE 1 TABLET BY MOUTH DAILY 90 Tablet 1   ??? pantoprazole (PROTONIX) 40 mg tablet TAKE 1 TABLET BY MOUTH DAILY 90 Tablet 1   ???  mycophenolate (CELLCEPT) 500 mg tablet TAKE 1 TABLET BY MOUTH TWICE DAILY 180 Tablet 2   ??? predniSONE (DELTASONE) 20 mg tablet TAKE 1 TABLET BY MOUTH DAILY 30 Tablet 2   ??? pyridostigmine (MESTINON) 60 mg tablet Take 60 mg by mouth every six (6) hours. 1/2 tab - q 6 hours while awake.     ??? colesevelam (WELCHOL) 625 mg tablet TAKE 4 TABLETS DAILY       *GLENMARK* 360 Tab 3   ??? levothyroxine (SYNTHROID) 125 mcg tablet Take 1 Tab by mouth six (6) days a week. Take 1.5 tabs by mouth on Sunday only 100 Tab 3   ??? multivitamin (ONE A DAY) tablet Take 1 Tab by mouth daily.     ??? cholecalciferol, vitamin D3, (VITAMIN D3 PO) Take 4,000 Units by mouth daily.     ??? LOW-DOSE ASPIRIN PO Take 162 mg by mouth daily. Taking 2 tab daily     ??? docusate sodium (COLACE) 100 mg capsule Take 100 mg by mouth daily.       Allergies   Allergen Reactions   ??? Atorvastatin Myalgia   ??? Zocor [Simvastatin] Other (comments) and Diarrhea     Other reaction(s): Adverse reaction to substance   Myalgias     ??? Celebrex [Celecoxib] Other (comments)     Aggitation   ??? Gabapentin Other (comments)     FELT UNSTEADY ON MY FEET   ??? Levaquin [Levofloxacin] Palpitations   ??? Nexium [Esomeprazole Magnesium] Diarrhea   ??? Other Medication Swelling     Latanoprost drops   ??? Oxycontin [Oxycodone] Itching   ??? Pravastatin Nausea and Vomiting   ??? Statins-Hmg-Coa  Reductase Inhibitors Other (comments)     lymphacytic colitis     ??? Yellow Dye Hives       Family History   Problem Relation Age of Onset   ??? Cancer Mother         Breast   ??? Hypertension Mother    ??? Heart Failure Father         MI   ??? Heart Attack Father    ??? Hypertension Father    ??? MS Brother    ??? Anesth Problems Neg Hx      Social History     Tobacco Use   ??? Smoking status: Never Smoker   ??? Smokeless tobacco: Never Used   Substance Use Topics   ??? Alcohol use: No         [name], MD

## 2020-03-09 LAB — CBC WITH AUTOMATED DIFF
ABS. BASOPHILS: 0 10*3/uL (ref 0.0–0.1)
ABS. EOSINOPHILS: 0 10*3/uL (ref 0.0–0.4)
ABS. IMM. GRANS.: 0.2 10*3/uL — ABNORMAL HIGH (ref 0.00–0.04)
ABS. LYMPHOCYTES: 1.2 10*3/uL (ref 0.8–3.5)
ABS. MONOCYTES: 0.4 10*3/uL (ref 0.0–1.0)
ABS. NEUTROPHILS: 9.2 10*3/uL — ABNORMAL HIGH (ref 1.8–8.0)
ABSOLUTE NRBC: 0 10*3/uL (ref 0.00–0.01)
BASOPHILS: 0 % (ref 0–1)
EOSINOPHILS: 0 % (ref 0–7)
HCT: 40.5 % (ref 35.0–47.0)
HGB: 12.3 g/dL (ref 11.5–16.0)
IMMATURE GRANULOCYTES: 1 % — ABNORMAL HIGH (ref 0.0–0.5)
LYMPHOCYTES: 11 % — ABNORMAL LOW (ref 12–49)
MCH: 28.8 PG (ref 26.0–34.0)
MCHC: 30.4 g/dL (ref 30.0–36.5)
MCV: 94.8 FL (ref 80.0–99.0)
MONOCYTES: 4 % — ABNORMAL LOW (ref 5–13)
MPV: 9.8 FL (ref 8.9–12.9)
NEUTROPHILS: 84 % — ABNORMAL HIGH (ref 32–75)
NRBC: 0 PER 100 WBC
PLATELET: 253 10*3/uL (ref 150–400)
RBC: 4.27 M/uL (ref 3.80–5.20)
RDW: 13.3 % (ref 11.5–14.5)
WBC: 11 10*3/uL (ref 3.6–11.0)

## 2020-03-09 LAB — METABOLIC PANEL, COMPREHENSIVE
A-G Ratio: 1.5 (ref 1.1–2.2)
ALT (SGPT): 37 U/L (ref 12–78)
AST (SGOT): 17 U/L (ref 15–37)
Albumin: 4.1 g/dL (ref 3.5–5.0)
Alk. phosphatase: 76 U/L (ref 45–117)
Anion gap: 6 mmol/L (ref 5–15)
BUN/Creatinine ratio: 19 (ref 12–20)
BUN: 18 MG/DL (ref 6–20)
Bilirubin, total: 0.6 MG/DL (ref 0.2–1.0)
CO2: 29 mmol/L (ref 21–32)
Calcium: 8.8 MG/DL (ref 8.5–10.1)
Chloride: 108 mmol/L (ref 97–108)
Creatinine: 0.93 MG/DL (ref 0.55–1.02)
GFR est AA: >60 mL/min/{1.73_m2} (ref >60)
GFR est non-AA: 58 mL/min/{1.73_m2} — ABNORMAL LOW (ref >60)
Globulin: 2.8 g/dL (ref 2.0–4.0)
Glucose: 89 mg/dL (ref 65–100)
Potassium: 3.8 mmol/L (ref 3.5–5.1)
Protein, total: 6.9 g/dL (ref 6.4–8.2)
Sodium: 143 mmol/L (ref 136–145)

## 2020-03-09 LAB — LIPID PANEL
CHOL/HDL Ratio: 2.6 (ref 0.0–5.0)
Chol/HDL Ratio: 2.6 (ref 0.0–5.0)
Cholesterol, Total: 252 MG/DL — ABNORMAL HIGH (ref <200)
Cholesterol, total: 252 MG/DL — ABNORMAL HIGH (ref <200)
HDL Cholesterol: 98 MG/DL
HDL: 98 MG/DL
LDL Calculated: 130.6 MG/DL — ABNORMAL HIGH (ref 0–100)
LDL, calculated: 130.6 MG/DL — ABNORMAL HIGH (ref 0–100)
Triglyceride: 117 MG/DL (ref <150)
Triglycerides: 117 MG/DL (ref <150)
VLDL Cholesterol Calculated: 23.4 MG/DL
VLDL, calculated: 23.4 MG/DL

## 2020-03-09 LAB — TSH 3RD GENERATION
TSH: 0.54 u[IU]/mL (ref 0.36–3.74)
TSH: 0.54 u[IU]/mL (ref 0.36–3.74)

## 2020-03-09 LAB — HEMOGLOBIN A1C WITH EAG
Est. average glucose: 114 mg/dL
Hemoglobin A1c: 5.6 % (ref 4.0–5.6)

## 2020-03-09 LAB — T4, FREE
T4 Free: 1.4 NG/DL (ref 0.8–1.5)
T4, Free: 1.4 NG/DL (ref 0.8–1.5)

## 2020-03-09 LAB — CBC WITH AUTO DIFFERENTIAL
Basophils %: 0 % (ref 0–1)
Basophils Absolute: 0 10*3/uL (ref 0.0–0.1)
Eosinophils %: 0 % (ref 0–7)
Eosinophils Absolute: 0 10*3/uL (ref 0.0–0.4)
Granulocyte Absolute Count: 0.2 10*3/uL — ABNORMAL HIGH (ref 0.00–0.04)
Hematocrit: 40.5 % (ref 35.0–47.0)
Hemoglobin: 12.3 g/dL (ref 11.5–16.0)
Immature Granulocytes: 1 % — ABNORMAL HIGH (ref 0.0–0.5)
Lymphocytes %: 11 % — ABNORMAL LOW (ref 12–49)
Lymphocytes Absolute: 1.2 10*3/uL (ref 0.8–3.5)
MCH: 28.8 PG (ref 26.0–34.0)
MCHC: 30.4 g/dL (ref 30.0–36.5)
MCV: 94.8 FL (ref 80.0–99.0)
MPV: 9.8 FL (ref 8.9–12.9)
Monocytes %: 4 % — ABNORMAL LOW (ref 5–13)
Monocytes Absolute: 0.4 10*3/uL (ref 0.0–1.0)
NRBC Absolute: 0 10*3/uL (ref 0.00–0.01)
Neutrophils %: 84 % — ABNORMAL HIGH (ref 32–75)
Neutrophils Absolute: 9.2 10*3/uL — ABNORMAL HIGH (ref 1.8–8.0)
Nucleated RBCs: 0 PER 100 WBC
Platelets: 253 10*3/uL (ref 150–400)
RBC: 4.27 M/uL (ref 3.80–5.20)
RDW: 13.3 % (ref 11.5–14.5)
WBC: 11 10*3/uL (ref 3.6–11.0)

## 2020-03-09 LAB — COMPREHENSIVE METABOLIC PANEL
ALT: 37 U/L (ref 12–78)
AST: 17 U/L (ref 15–37)
Albumin/Globulin Ratio: 1.5 (ref 1.1–2.2)
Albumin: 4.1 g/dL (ref 3.5–5.0)
Alkaline Phosphatase: 76 U/L (ref 45–117)
Anion Gap: 6 mmol/L (ref 5–15)
BUN: 18 MG/DL (ref 6–20)
Bun/Cre Ratio: 19 (ref 12–20)
CO2: 29 mmol/L (ref 21–32)
Calcium: 8.8 MG/DL (ref 8.5–10.1)
Chloride: 108 mmol/L (ref 97–108)
Creatinine: 0.93 MG/DL (ref 0.55–1.02)
EGFR IF NonAfrican American: 58 mL/min/{1.73_m2} — ABNORMAL LOW (ref >60)
GFR African American: >60 mL/min/{1.73_m2} (ref >60)
Globulin: 2.8 g/dL (ref 2.0–4.0)
Glucose: 89 mg/dL (ref 65–100)
Potassium: 3.8 mmol/L (ref 3.5–5.1)
Sodium: 143 mmol/L (ref 136–145)
Total Bilirubin: 0.6 MG/DL (ref 0.2–1.0)
Total Protein: 6.9 g/dL (ref 6.4–8.2)

## 2020-03-09 LAB — HEMOGLOBIN A1C W/EAG
Hemoglobin A1C: 5.6 % (ref 4.0–5.6)
eAG: 114 mg/dL

## 2020-03-19 NOTE — Telephone Encounter (Signed)
Re: Pantoprazole Sodium    Rcvd PA request and submitted through [facility], Key# [id number]    Submitted info and awaiting update.

## 2020-03-21 NOTE — Telephone Encounter (Signed)
rcvd PA approval effective 02/18/20-03/19/21. Scanned to chart.

## 2020-05-13 ENCOUNTER — Encounter

## 2020-05-13 MED ORDER — PYRIDOSTIGMINE BROMIDE 30 MG TABLET
30 mg | Freq: Three times a day (TID) | ORAL | 3 refills | Status: DC
Start: 2020-05-13 — End: 2020-07-22

## 2020-05-16 ENCOUNTER — Inpatient Hospital Stay: Admit: 2020-05-16 | Payer: MEDICARE | Attending: Nurse Practitioner | Primary: Internal Medicine

## 2020-05-16 DIAGNOSIS — M810 Age-related osteoporosis without current pathological fracture: Secondary | ICD-10-CM

## 2020-05-16 NOTE — Progress Notes (Signed)
Let her know this is improved

## 2020-05-17 ENCOUNTER — Encounter

## 2020-05-17 MED ORDER — PANTOPRAZOLE 40 MG TAB, DELAYED RELEASE
40 mg | ORAL_TABLET | Freq: Every day | ORAL | 1 refills | Status: DC
Start: 2020-05-17 — End: 2020-07-22

## 2020-05-20 ENCOUNTER — Telehealth: Admit: 2020-05-20 | Payer: MEDICARE | Attending: Internal Medicine | Primary: Internal Medicine

## 2020-05-20 ENCOUNTER — Telehealth: Attending: Internal Medicine | Primary: Internal Medicine

## 2020-05-20 DIAGNOSIS — L659 Nonscarring hair loss, unspecified: Secondary | ICD-10-CM

## 2020-05-20 DIAGNOSIS — E034 Atrophy of thyroid (acquired): Secondary | ICD-10-CM

## 2020-05-20 NOTE — Progress Notes (Signed)
Patient notified of over replaced thyroid. Advised given s/s she is experiencing her thyroid dose has been decreased to 112 mcg daily. Advised needs appt in 2 months.

## 2020-05-20 NOTE — Progress Notes (Signed)
Consent: Jillian Bean, who was seen by synchronous (real-time) audio-video technology, and/or her healthcare decision maker, is aware that this patient-initiated, Telehealth encounter on 05/20/2020 is a billable service, with coverage as determined by her insurance carrier. She is aware that she may receive a bill and has provided verbal consent to proceed: YES  712  Subjective:   Jillian Bean is a 79 y.o. female who was seen for Alopecia    1.hair loss-esp  Occipital area-no change in nails some loss of eyelashes-has been on rx for ocular myasthenia gravis iincluding steroids  2.thyroid-tsh 0.54 in July-no sxs of tremor or diarrhea  3.myasthenia on chronic steroids now      Current Outpatient Medications   Medication Sig   ??? pantoprazole (PROTONIX) 40 mg tablet TAKE 1 TABLET BY MOUTH DAILY   ??? pyridostigmine bromide 30 mg tab Take 30 mg by mouth three (3) times daily.   ??? ezetimibe (ZETIA) 10 mg tablet TAKE 1 TABLET BY MOUTH DAILY   ??? mycophenolate (CELLCEPT) 500 mg tablet TAKE 1 TABLET BY MOUTH TWICE DAILY   ??? predniSONE (DELTASONE) 20 mg tablet TAKE 1 TABLET BY MOUTH DAILY   ??? colesevelam (WELCHOL) 625 mg tablet TAKE 4 TABLETS DAILY       *GLENMARK*   ??? levothyroxine (SYNTHROID) 125 mcg tablet Take 1 Tab by mouth six (6) days a week. Take 1.5 tabs by mouth on Sunday only   ??? multivitamin (ONE A DAY) tablet Take 1 Tab by mouth daily.   ??? cholecalciferol, vitamin D3, (VITAMIN D3 PO) Take 4,000 Units by mouth daily.   ??? LOW-DOSE ASPIRIN PO Take 162 mg by mouth daily. Taking 2 tab daily   ??? docusate sodium (COLACE) 100 mg capsule Take 100 mg by mouth daily.     No current facility-administered medications for this visit.       Allergies   Allergen Reactions   ??? Atorvastatin Myalgia   ??? Zocor [Simvastatin] Other (comments) and Diarrhea     Other reaction(s): Adverse reaction to substance   Myalgias     ??? Celebrex [Celecoxib] Other (comments)     Aggitation   ??? Gabapentin Other (comments)     FELT UNSTEADY ON MY  FEET   ??? Levaquin [Levofloxacin] Palpitations   ??? Nexium [Esomeprazole Magnesium] Diarrhea   ??? Other Medication Swelling     Latanoprost drops   ??? Oxycontin [Oxycodone] Itching   ??? Pravastatin Nausea and Vomiting   ??? Statins-Hmg-Coa Reductase Inhibitors Other (comments)     lymphacytic colitis     ??? Yellow Dye Hives       Past Medical History:   Diagnosis Date   ??? Anemia    ??? Back pain 09/01/2007   ??? Cancer (HCC)     R breast   ??? Chronic pain     BACK PAIN   ??? Coagulation disorder ([place])     IRON DEFFICIENCY ANEMIA   ??? Colonic polyps 08/31/1998   ??? 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 DVT (deep venous thrombosis) ([place]) 01/2017   ??? Hiatal hernia 07/11/2011    Dr.sobieski   ??? Hypercholesteremia 09/01/2007   ??? Hypothyroidism 09/01/2003   ??? 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 Nausea & vomiting    ??? OA (osteoarthritis) 09/01/2007   ??? Other ill-defined conditions(799.89)     VERTIGO   ??? Plantar fasciitis 09/01/2007   ???  S/P colonoscopy 10/10/2007   ??? Thromboembolus ([place]) 01/2017    RIGHT LEG   ??? Thyroid disease        ROS  All other systems reviewed and negative, unless mentioned in HPI    Objective:   Vital Signs: (As obtained by patient/caregiver at home)  Visit Vitals  LMP 04/13/2010        [INSTRUCTIONS:  "[x] " Indicates a positive item  "[] " Indicates a negative item  -- DELETE ALL ITEMS NOT EXAMINED]    Constitutional: [x]  Appears well-developed and well-nourished [x]  No apparent distress      []  Abnormal -     Mental status: [x]  Alert and awake  [x]  Oriented to person/place/time [x]  Able to follow commands    []  Abnormal -     Eyes:   EOM    [x]   Normal    []  Abnormal -   Sclera  [x]   Normal    []  Abnormal -          Discharge [x]   None visible   []  Abnormal -     HENT: [x]  Normocephalic, atraumatic  []  Abnormal -       External Ears [x]  Normal  []  Abnormal -    Neck: [x]  No visualized mass []  Abnormal -      Pulmonary/Chest: [x]  Respiratory effort normal   [x]  No visualized signs of difficulty breathing or respiratory distress        []  Abnormal -      Musculoskeletal:            [x]  Normal range of motion of neck        []  Abnormal -     Neurological:        [x]  No Facial Asymmetry (Cranial nerve 7 motor function) (limited exam due to video visit)          [x]  No gaze palsy        []  Abnormal -          Skin:        [x]  No significant exanthematous lesions or discoloration noted on facial skin         []  Abnormal -            Psychiatric:       [x]  Normal Affect []  Abnormal -           Other pertinent observable physical exam findings:-        Assessment & Plan:   Diagnoses and all orders for this visit:    1. 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 Myasthenia gravis ([place])    4. Current chronic use of systemic steroids  Advised booster moderna shot          We discussed the expected course, resolution and complications of the diagnosis(es) in detail.  Medication risks, benefits, costs, interactions, and alternatives were discussed as indicated.  I advised her to contact the office if her condition worsens, changes or fails to improve as anticipated. She expressed understanding with the diagnosis(es) and plan.     Jillian Bean is a 79 y.o. female being evaluated by a  video visit encounter for concerns as above.  A caregiver was present when appropriate. Due to this being a Scientist, physiological (During WNUUV-25 public health emergency), evaluation of the following organ systems was limited: Vitals/Constitutional/EENT/Resp/CV/GI/GU/MS/Neuro/Skin/Heme-Lymph-Imm.  Pursuant to the emergency declaration under the [place], 1135 waiver authority and  the [employer] Act, this Virtual  Visit was conducted, with patient's (and/or legal guardian's) consent, to reduce the patient's risk of exposure to COVID-19 and provide necessary medical care.     Services were provided through a video synchronous discussion virtually to substitute for in-person clinic visit.   Patient and provider were located at their individual homes.

## 2020-05-20 NOTE — Progress Notes (Signed)
Please notify her that based  on her sxs of hair loss and labs confirming over replaced on thyroid I sent a decreased dose of synthroid 112 mcg to take daily to her local pharmacy and needs appt in 2 mo thanks

## 2020-05-20 NOTE — Telephone Encounter (Signed)
-----   Message from Nicole Cella sent at 05/20/2020  7:52 AM EDT -----  Regarding: Dr. Worthy Flank  General Message/Vendor Calls    Caller's first and last name:Jillian Bean      Reason for call:no check in call      Callback required yes/no and why:yes      Best contact number(s): [phone]      Details to clarify the request:Pt stated she has not received a check in call for virtual appt at 8:00 a.m.      Nicole Cella

## 2020-05-20 NOTE — Telephone Encounter (Signed)
Patient completed her VV today.

## 2020-05-30 ENCOUNTER — Encounter

## 2020-05-30 MED ORDER — PREDNISONE 20 MG TAB
20 mg | ORAL_TABLET | ORAL | 2 refills | Status: DC
Start: 2020-05-30 — End: 2020-07-22

## 2020-05-31 MED ORDER — SYNTHROID 125 MCG TABLET
125 mcg | ORAL_TABLET | ORAL | 3 refills | Status: DC
Start: 2020-05-31 — End: 2020-06-03

## 2020-06-03 LAB — TSH 3RD GENERATION
TSH: 0.047 u[IU]/mL — ABNORMAL LOW (ref 0.450–4.500)
TSH: 0.047 u[IU]/mL — ABNORMAL LOW (ref 0.450–4.500)

## 2020-06-03 LAB — T4, FREE
T4 Free: 2.08 ng/dL — ABNORMAL HIGH (ref 0.82–1.77)
T4, Free: 2.08 ng/dL — ABNORMAL HIGH (ref 0.82–1.77)

## 2020-06-03 MED ORDER — SYNTHROID 112 MCG TABLET
112 mcg | ORAL_TABLET | Freq: Every day | ORAL | 3 refills | Status: DC
Start: 2020-06-03 — End: 2020-09-20

## 2020-06-22 ENCOUNTER — Inpatient Hospital Stay: Admit: 2020-06-22 | Primary: Internal Medicine

## 2020-06-27 ENCOUNTER — Ambulatory Visit: Payer: MEDICARE | Attending: Specialist | Primary: Internal Medicine

## 2020-06-28 ENCOUNTER — Ambulatory Visit: Admit: 2020-06-28 | Discharge: 2020-06-28 | Payer: MEDICARE | Attending: Specialist | Primary: Internal Medicine

## 2020-06-28 ENCOUNTER — Inpatient Hospital Stay: Admit: 2020-06-28 | Payer: MEDICARE | Primary: Internal Medicine

## 2020-06-28 ENCOUNTER — Inpatient Hospital Stay: Payer: MEDICARE | Primary: Internal Medicine

## 2020-06-28 ENCOUNTER — Ambulatory Visit: Attending: Specialist | Primary: Internal Medicine

## 2020-06-28 DIAGNOSIS — Z853 Personal history of malignant neoplasm of breast: Secondary | ICD-10-CM

## 2020-06-28 LAB — POC CHEM8
ANION GAP ISTAT,IAGAP: 8 mmol/L — ABNORMAL LOW (ref 10–20)
Anion gap (POC): 8 mmol/L — ABNORMAL LOW (ref 10–20)
CHLORIDE ISTAT,ICL: 110 mmol/L — ABNORMAL HIGH (ref 98–107)
CO2 (POC): 27.6 mmol/L (ref 21–32)
Calcium, ionized (POC): 1.18 mmol/L (ref 1.12–1.32)
Calcium, ionized, POC: 1.18 mmol/L (ref 1.12–1.32)
Chloride (POC): 110 mmol/L — ABNORMAL HIGH (ref 98–107)
Creatinine (POC): 0.87 mg/dL (ref 0.6–1.3)
GFR African American: >60 mL/min/{1.73_m2} (ref >60)
GFR Non-African American: >60 mL/min/{1.73_m2} (ref >60)
GFRAA, POC: >60 mL/min/{1.73_m2} (ref >60)
GFRNA, POC: >60 mL/min/{1.73_m2} (ref >60)
GLUCOSE ISTAT,IGLU: 100 mg/dL (ref 65–100)
Glucose (POC): 100 mg/dL (ref 65–100)
POC Creatinine: 0.87 mg/dL (ref 0.6–1.3)
POTASSIUM ISTAT,IK: 3.7 mmol/L (ref 3.5–5.1)
Potassium (POC): 3.7 mmol/L (ref 3.5–5.1)
SODIUM ISTAT,INA: 145 mmol/L (ref 136–145)
Sodium (POC): 145 mmol/L (ref 136–145)
TCO2 ISTAT,ITCO2: 27.6 mmol/L (ref 21–32)

## 2020-06-28 MED ORDER — SODIUM CHLORIDE 0.9 % IV
INTRAVENOUS | Status: AC
Start: 2020-06-28 — End: 2020-06-28
  Administered 2020-06-28: 16:00:00 via INTRAVENOUS

## 2020-06-28 MED ORDER — SODIUM CHLORIDE 0.9 % IV
4 mg/5 mL | Freq: Once | INTRAVENOUS | Status: AC
Start: 2020-06-28 — End: 2020-06-28
  Administered 2020-06-28: 16:00:00 via INTRAVENOUS

## 2020-06-28 MED FILL — ZOLEDRONIC ACID 4 MG/5 ML IV: 4 mg/5 mL | INTRAVENOUS | Qty: 4.38

## 2020-06-28 MED FILL — SODIUM CHLORIDE 0.9 % IV: INTRAVENOUS | Qty: 1000

## 2020-06-28 NOTE — Progress Notes (Signed)
Jillian Bean is a 79 y.o. female Follow up for the evaluation of breast cancer.     1. Have you been to the ER, urgent care clinic since your last visit?  Hospitalized since your last visit? No    2. Have you seen or consulted any other health care providers outside of the [facility] since your last visit?  Include any pap smears or colon screening. No

## 2020-06-28 NOTE — Progress Notes (Signed)
[facility]  [place], [place]   [address]  W: [phone]   F: [phone]      F/u HEME/ONC CONSULT    Reason for visit: evaluation for treatment for breast cancer    Consulting physician: Dr. Gilford Rile    HPI:   Jillian Bean is a 79 y.o.  female who I was asked to see in consultation at the request of Dr. Elie Confer for evaluation for systemic therapy for breast cancer.    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 Right lumpectomy on 11/10/14 shows IDC, 1.7 cm, gr 3, 0/5 LN, no LVI.  PT1cN0Mx.    [place] q 3 weeks x 6: 12/21/14- 04/12/15  C#5 held 1 week on 03/15/15 due to thrombocytopenia   [place] Herceptin: 05/03/15-11/29/15    S/p XRT 06/03/15    Bone Marrow Bx 10/09/16: Variably normocellular marrow with trilineage hematopoiesis.    Interval History: In today for follow up. Reports gr 1 fatigue, gr 1 hair loss.    DX   Encounter Diagnoses   Name Primary?   ??? History of breast cancer Yes   ??? Osteoporosis without current pathological fracture, unspecified osteoporosis type      Past Medical History:   Diagnosis Date   ??? Anemia    ??? Back pain 09/01/2007   ??? Cancer (HCC)     R breast   ??? Chronic pain     BACK PAIN   ??? Coagulation disorder ([place])     IRON DEFFICIENCY ANEMIA   ??? Colonic polyps 08/31/1998   ??? 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 DVT (deep venous thrombosis) ([place]) 01/2017   ??? Hiatal hernia 07/11/2011    Dr.sobieski   ??? Hypercholesteremia 09/01/2007   ??? Hypothyroidism 09/01/2003   ??? 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 Nausea & vomiting    ??? OA (osteoarthritis) 09/01/2007   ??? Other ill-defined conditions(799.89)     VERTIGO   ??? Plantar fasciitis 09/01/2007   ??? S/P colonoscopy 10/10/2007   ??? Thromboembolus ([place]) 01/2017    RIGHT LEG   ???  Thyroid disease      Past Surgical History:   Procedure Laterality Date   ??? COLONOSCOPY N/A 11/12/2016    COLONOSCOPY performed by Burnett Harry, MD at [facility]   ??? COLONOSCOPY,DIAGNOSTIC  11/12/2016        ??? ENDOSCOPY, COLON, DIAGNOSTIC  7/12/18/08    7/05 Dr Maudry Diego   ??? HX BREAST LUMPECTOMY Right 2016   ??? HX BREAST REDUCTION  1993   ??? HX CATARACT REMOVAL Right 01/21/2018   ??? HX CATARACT REMOVAL Left 03/11/2018   ??? HX COLONOSCOPY  2014    Dr Illene Silver   ??? HX GI      COLONOSCOPY   ??? HX HEENT  04/07/10    thyroid biopsy neg dr Leward Quan   ??? HX HEENT      wisdom teeth extraction   ??? HX HYSTERECTOMY  1975    PARTIAL   ??? HX KNEE REPLACEMENT Left 08/22/09   ??? HX KNEE REPLACEMENT Right 08/13/12   ??? HX ORTHOPAEDIC Right 11/26/2016    cleaned infection in joint   ??? HX ORTHOPAEDIC  Right 03/2017    revision total knee replacement   ??? HX OTHER SURGICAL  2013    MINIMALLY INVASIVE LUMBAR DECOMPRESSION   ??? NEUROLOGICAL PROCEDURE UNLISTED  2013    LUMBAR DECOMPRESSION   ??? UPPER GI ENDOSCOPY,BIOPSY  11/12/2016          Social History     Socioeconomic History   ??? Marital status: MARRIED   Tobacco Use   ??? Smoking status: Never Smoker   ??? Smokeless tobacco: Never Used   Substance and Sexual Activity   ??? Alcohol use: No   ??? Drug use: No   ??? Sexual activity: Not Currently     Family History   Problem Relation Age of Onset   ??? Cancer Mother         Breast   ??? Hypertension Mother    ??? Heart Failure Father         MI   ??? Heart Attack Father    ??? Hypertension Father    ??? Mult Sclerosis Brother    ??? Anesth Problems Neg Hx        Current Outpatient Medications   Medication Sig Dispense Refill   ??? Synthroid 112 mcg tablet Take 1 Tablet by mouth Daily (before breakfast). 30 Tablet 3   ??? predniSONE (DELTASONE) 20 mg tablet TAKE 1 TABLET BY MOUTH DAILY 30 Tablet 2   ??? pantoprazole (PROTONIX) 40 mg tablet TAKE 1 TABLET BY MOUTH DAILY 30 Tablet 1   ??? pyridostigmine bromide 30 mg tab Take 30 mg by mouth three (3) times daily. 90 Each 3   ??? ezetimibe  (ZETIA) 10 mg tablet TAKE 1 TABLET BY MOUTH DAILY 90 Tablet 1   ??? mycophenolate (CELLCEPT) 500 mg tablet TAKE 1 TABLET BY MOUTH TWICE DAILY 180 Tablet 2   ??? colesevelam (WELCHOL) 625 mg tablet TAKE 4 TABLETS DAILY       *GLENMARK* 360 Tab 3   ??? multivitamin (ONE A DAY) tablet Take 1 Tab by mouth daily.     ??? cholecalciferol, vitamin D3, (VITAMIN D3 PO) Take 4,000 Units by mouth daily.     ??? LOW-DOSE ASPIRIN PO Take 162 mg by mouth daily. Taking 2 tab daily     ??? docusate sodium (COLACE) 100 mg capsule Take 100 mg by mouth daily.       Allergies   Allergen Reactions   ??? Atorvastatin Myalgia   ??? Zocor [Simvastatin] Other (comments) and Diarrhea     Other reaction(s): Adverse reaction to substance   Myalgias     ??? Celebrex [Celecoxib] Other (comments)     Aggitation   ??? Gabapentin Other (comments)     FELT UNSTEADY ON MY FEET   ??? Levaquin [Levofloxacin] Palpitations   ??? Nexium [Esomeprazole Magnesium] Diarrhea   ??? Other Medication Swelling     Latanoprost drops   ??? Oxycontin [Oxycodone] Itching   ??? Pravastatin Nausea and Vomiting   ??? Statins-Hmg-Coa Reductase Inhibitors Other (comments)     lymphacytic colitis     ??? Yellow Dye Hives       Review of Systems    A comprehensive review of systems was performed and all systems were negative except for HPI and for the symptom report form, reviewed and scanned in.    Objective:  Physical Exam:  Visit Vitals  BP (!) 140/70   Pulse 69   Temp 98.2 ??F (36.8 ??C) (Temporal)   Resp 18   Ht '5\' 5"'  (1.651 m)  Wt 148 lb (67.1 kg)   LMP 04/13/2010   SpO2 99%   BMI 24.63 kg/m??     General: No distress  Eyes: Anicteric sclerae  HENT: Atraumatic  Neck: Supple  Respiratory: Normal respiratory effort  CV: No peripheral edema  GI: nondistended  Skin: No rashes, ecchymoses, or petechiae  Psych: Alert, oriented, appropriate affect, normal judgment/insight      Diagnostic Imaging     12/16/14 TTE: EF 71%.  03/02/15 TTE: EF 68%  05/30/15 TTE :  EF 65%  07/21/15 TTE: EF 66%  10/06/15 TTE: EF  62%  05/23/16 TTE EF 60%    03/15/16 dexa  Findings:  ????  Fractures identified on Lateral scanogram:  None  ????  Femoral Neck:  Right  Bone mineral density (gm/cm2):? 0.649  % of peak bone mass: 63  % for age matched controls:? 86  T-score: -2.8  Z-score: -0.8  ????  Total Hip: Right  Bone mineral density (gm/cm2):  0.761  % of peak bone mass:   76  % for age matched controls:  98  T-score:   -2.0  Z-score:  -0.1  ??  Lumbar Spine:  L1-L4  Bone mineral density (gm/cm2):  1.211  % of peak bone mass:  101   % for age matched controls:  124  T-score:  0.1  Z-score:  2.0  ??  The T score for the left distal third radius is -1.4.  ????  IMPRESSION  Impression:  ????  This patient is osteoporotic using the World Health Organization criteria  As compared to the prior study, there has been no change in the bone mineral  density of the right total hip and an increase in the bone mineral density of  the lumbar spine of 2.0%.  10 year probability of major osteoporotic fracture:  19.6%  10 year probability of hip fracture:  7.4%    09/20/16 CT c/a/p:  IMPRESSION:  No acute process in the chest, abdomen, and pelvis.  ??  05/16/20 dexa  Findings:     Fractures identified on Lateral scanogram: None     Femoral Neck Left:  Bone mineral density (gm/cm2): 0.703 g/cm?  % of peak bone mass: 68%  % for age matched controls: 95%  T-score: -2.4   Z-score: -0.3   ??  Femoral Neck Right:  Bone mineral density (gm/cm2): 0.637 g/cm?  % of peak bone mass: 61%  % for age matched controls:  86%  T-score: -2.9   Z-score: -0.7      Total Hip Left:  Bone mineral density (gm/cm2): 0.813 g/cm?  % of peak bone mass: 81%  % for age matched controls: 108%  T-score: -1.5   Z-score: 0.5   ??  Total Hip Right:  Bone mineral density (gm/cm2): 0.746 g/cm?  % of peak bone mass: 74%  % for age matched controls:  99%  T-score: -2.1   Z-score: -0.1   ??  Lumbar Spine: L1-L4  Bone mineral density (gm/cm2): 1.245 g/cm?  % of peak bone mass: 104%  % for age matched controls:  128%  T-score: 0.4   Z-score: 2.2   ??  33% Radius Left:  Bone mineral density (gm/cm2): 0.608 g/cm?  % of peak bone mass: 85%  % for age matched controls:  118%  T-score: -1.5   Z-score: 1.3   ??  IMPRESSION     This patient is osteoporotic using the World Health Organization criteria  A direct comparison to the last  exam cannot be made due to differences in  machinery.   10 year probability of major osteoporotic fracture: 23%  10 year probability of hip fracture: 9.4%    Lab Results  Lab Results   Component Value Date/Time    WBC 11.0 03/08/2020 09:56 AM    HGB 12.3 03/08/2020 09:56 AM    HCT 40.5 03/08/2020 09:56 AM    PLATELET 253 03/08/2020 09:56 AM    MCV 94.8 03/08/2020 09:56 AM     Lab Results   Component Value Date/Time    Sodium 143 03/08/2020 09:56 AM    Potassium 3.8 03/08/2020 09:56 AM    Chloride 108 03/08/2020 09:56 AM    CO2 29 03/08/2020 09:56 AM    Anion gap 6 03/08/2020 09:56 AM    Glucose 89 03/08/2020 09:56 AM    BUN 18 03/08/2020 09:56 AM    Creatinine 0.93 03/08/2020 09:56 AM    BUN/Creatinine ratio 19 03/08/2020 09:56 AM    GFR est AA >60 03/08/2020 09:56 AM    GFR est non-AA 58 (L) 03/08/2020 09:56 AM    Calcium 8.8 03/08/2020 09:56 AM    Alk. phosphatase 76 03/08/2020 09:56 AM    Protein, total 6.9 03/08/2020 09:56 AM    Albumin 4.1 03/08/2020 09:56 AM    Globulin 2.8 03/08/2020 09:56 AM    A-G Ratio 1.5 03/08/2020 09:56 AM    ALT (SGPT) 37 03/08/2020 09:56 AM       Lab Results   Component Value Date/Time    Reticulocyte count 1.5 09/05/2016 09:40 AM    Iron % saturation 18 07/22/2017 10:57 AM    TIBC 198 (L) 07/22/2017 10:57 AM    Ferritin 1,447 (H) 07/22/2017 10:57 AM    Vitamin B12 542 07/30/2016 09:31 AM    Folate 14.8 07/30/2016 09:31 AM    Haptoglobin 412 (H) 09/05/2016 09:40 AM    LD 192 09/05/2016 09:40 AM    Sed rate, automated 19 11/25/2019 08:53 AM    C-Reactive protein 21.70 (H) 11/28/2016 05:25 AM    TSH 0.047 (L) 06/02/2020 03:05 PM     Lab Results   Component Value Date/Time     INR 1.0 02/15/2017 03:11 PM    aPTT 28.7 08/18/2009 01:05 PM       Assessment/Plan:  79 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  PS 0    1. Right Breast cancer stage: IA    Hormonal therapy: not indicated due to receptor (-) status    We explained to the patient that the goal of systemic adjuvant therapy is to improve the chances for cure and decrease the risk of relapse. We explained why a patient can have microscopic cancer spread now even though physical examination, laboratory studies and imaging studies are negative for cancer. We explained that the same treatments used now as adjuvant or preventive treatments rarely if ever are curative in women who develop metastases.     No evidence of recurrence    Mammogram in 04/2020 at [facility], will get records, benign per pt    She declined to take neratinib.    2. Emotional well being: She has excellent support and is coping well with her disease.    3. FH of breast cancer: BRCA 1/2 [place] testing negative    4. Osteoporosis: On dexa from 2015; was on fosamax from Dr. Wanda Plump, started 08/2016, but stopped prior to surgery in  11/2016. DEXA 03/15/16 showed some improvement. Dr. Wanda Plump has recommended Reclast    We discussed the data from [place], [place] 2013, for the meta-analysis for adjuvant bisphosphonate use in breast cancer.  We discussed that in postmenopausal women, it appears that the bisphosphate zolendronic acid, given as in ABCSG 12 at 4 mg IV q 6 months x 3 years, is beneficial in improving bone DFS and OS.  We discussed side effects such as ONJ and the need to avoid invasive dental procedures while on these medications, renal damage, and hypocalcemia.    After this discussion, she is agreeable to zometa as above.  #1, 12/29/2018, #2 06/29/19, #3 12/28/19, #4 06/28/20.    Repeat DEXA due 05/2020, improved    5. R leg DVT: provoked, in non weight bearing leg s/p surgery. Reports she stopped Eliquis  08/2017.     6. Myasthenia Gravis:  On Mestinon and prednisone. Seeing neurology      Sonda Rumble, MD

## 2020-06-28 NOTE — Progress Notes (Signed)
 [facility] Progress Note    Date: June 28, 2020    Name: Jillian Bean    MRN: [id number]         DOB: [birth year]    Jillian Bean Arrived ambulatory and in no distress for Zometa Infusion.  Assessment was completed, no acute issues at this time, no new complaints voiced.  24 G PIV established to left arm, + blood return, blood drawn and sent for processing.    Jillian Bean vitals were reviewed.  Visit Vitals  BP (!) 119/56   Pulse 73   Temp 98.2 F (36.8 C)   Resp 18   Ht 5' 5 (1.651 m)   Wt 67.5 kg (148 lb 14.4 oz)   SpO2 99%   BMI 24.78 kg/m       Lab results were obtained and reviewed.  Recent Results (from the past 12 hour(s))   POC CHEM8    Collection Time: 06/28/20  9:45 AM   Result Value Ref Range    Calcium, ionized (POC) 1.18 1.12 - 1.32 mmol/L    Sodium (POC) 145 136 - 145 mmol/L    Potassium (POC) 3.7 3.5 - 5.1 mmol/L    Chloride (POC) 110 (H) 98 - 107 mmol/L    CO2 (POC) 27.6 21 - 32 mmol/L    Anion gap (POC) 8 (L) 10 - 20 mmol/L    Glucose (POC) 100 65 - 100 mg/dL    Creatinine (POC) 9.12 0.6 - 1.3 mg/dL    GFRAA, POC >39 >39 fo/fpw/8.26f7    GFRNA, POC >60 >60 ml/min/1.72m2    Comment Comment Not Indicated.         Medications:  Medications Administered     0.9% sodium chloride infusion     Admin Date  06/28/2020 Action  New Bag Dose  25 mL/hr Rate  25 mL/hr Route  IntraVENous Administered By  Mora Mariel SQUIBB, RN          zoledronic acid (ZOMETA) 3.5 mg in 0.9% sodium chloride 100 mL IVPB     Admin Date  06/28/2020 Action  New Bag Dose  3.5 mg Route  IntraVENous Administered By  Mora Mariel SQUIBB, RN              Jillian Bean tolerated treatment well and [facility] at 1110am.   PIV flushed & removed. She is to return on Dec 27 2019 at 1000am for her next appointment.    Mariel SQUIBB Mora, RN  June 28, 2020

## 2020-06-29 ENCOUNTER — Encounter: Attending: Nurse Practitioner | Primary: Internal Medicine

## 2020-07-20 ENCOUNTER — Ambulatory Visit: Admit: 2020-07-20 | Payer: MEDICARE | Attending: Internal Medicine | Primary: Internal Medicine

## 2020-07-20 ENCOUNTER — Ambulatory Visit: Attending: Internal Medicine | Primary: Internal Medicine

## 2020-07-20 DIAGNOSIS — E034 Atrophy of thyroid (acquired): Secondary | ICD-10-CM

## 2020-07-20 DIAGNOSIS — E785 Hyperlipidemia, unspecified: Secondary | ICD-10-CM

## 2020-07-20 MED ORDER — EZETIMIBE 10 MG TAB
10 mg | ORAL_TABLET | Freq: Every day | ORAL | 2 refills | Status: DC
Start: 2020-07-20 — End: 2020-12-20

## 2020-07-20 NOTE — Progress Notes (Signed)
Message sent about labs  Resume zetia

## 2020-07-20 NOTE — Progress Notes (Signed)
HISTORY OF PRESENT ILLNESS  Jillian Bean is a 79 y.o. female.  HPI  Seen at follow up. In November her TSH showed over-replaced and we decreased her Synthroid dose from 125 to 112. She feels that the hair loss has calmed down.  She has been treated for myasthenia gravis and is on tapering prednisone, down to 10 mg a day.  Hopefully will be able to drop further.  She came off of both Welchol and Zetia.  I am suggesting she go back on the Zetia.  Will check baseline lipids today.      Review of Systems   Constitutional: Negative for chills, fever and weight loss.   Respiratory: Negative for cough, shortness of breath and wheezing.    Cardiovascular: Negative for chest pain, palpitations, orthopnea, leg swelling and PND.   Gastrointestinal: Negative for abdominal pain, heartburn and nausea.   Musculoskeletal: Negative for myalgias.   Neurological: Negative for dizziness and headaches.       Physical Exam  Vitals and nursing note reviewed.   Constitutional:       Appearance: She is well-developed.   HENT:      Head: Normocephalic and atraumatic.   Neck:      Thyroid: No thyromegaly.      Vascular: No carotid bruit.   Cardiovascular:      Rate and Rhythm: Normal rate and regular rhythm.      Heart sounds: Normal heart sounds, S1 normal and S2 normal. No murmur heard.      Pulmonary:      Effort: Pulmonary effort is normal. No respiratory distress.      Breath sounds: Normal breath sounds. No wheezing or rales.   Musculoskeletal:      Cervical back: Normal range of motion and neck supple.   Lymphadenopathy:      Cervical: No cervical adenopathy.   Neurological:      Mental Status: She is alert and oriented to person, place, and time.   Psychiatric:         Behavior: Behavior normal.         ASSESSMENT and PLAN  Diagnoses and all orders for this visit:    1. Hypothyroidism due to acquired atrophy of thyroid  -     TSH 3RD GENERATION; Future    2. Hair loss    3. Myasthenia gravis ([place])    4. Dyslipidemia, goal LDL below  161  -     METABOLIC PANEL, COMPREHENSIVE; Future  -     LIPID PANEL; Future  -     ezetimibe (ZETIA) 10 mg tablet; Take 1 Tablet by mouth daily.      the following changes in treatment are made: resume zetia  Try to taper down on prednisone from 10 mg dose with eye doctor

## 2020-07-20 NOTE — Addendum Note (Signed)
Addendum  Note by Bobbye Charleston at 07/20/20 0815                Author: Bobbye Charleston  Service: --  Author Type: Technician       Filed: 07/20/20 0845  Encounter Date: 07/20/2020  Status: Signed          Editor: Bobbye Charleston (Technician)          Addended by: Bobbye Charleston on: 07/20/2020 08:45 AM    Modules accepted: Orders

## 2020-07-21 LAB — METABOLIC PANEL, COMPREHENSIVE
A-G Ratio: 1.5 (ref 1.1–2.2)
ALT (SGPT): 33 U/L (ref 12–78)
AST (SGOT): 16 U/L (ref 15–37)
Albumin: 4 g/dL (ref 3.5–5.0)
Alk. phosphatase: 64 U/L (ref 45–117)
Anion gap: 7 mmol/L (ref 5–15)
BUN/Creatinine ratio: 25 — ABNORMAL HIGH (ref 12–20)
BUN: 24 MG/DL — ABNORMAL HIGH (ref 6–20)
Bilirubin, total: 0.6 MG/DL (ref 0.2–1.0)
CO2: 28 mmol/L (ref 21–32)
Calcium: 9.3 MG/DL (ref 8.5–10.1)
Chloride: 107 mmol/L (ref 97–108)
Creatinine: 0.96 MG/DL (ref 0.55–1.02)
GFR est AA: >60 mL/min/{1.73_m2} (ref >60)
GFR est non-AA: 56 mL/min/{1.73_m2} — ABNORMAL LOW (ref >60)
Globulin: 2.6 g/dL (ref 2.0–4.0)
Glucose: 75 mg/dL (ref 65–100)
Potassium: 4 mmol/L (ref 3.5–5.1)
Protein, total: 6.6 g/dL (ref 6.4–8.2)
Sodium: 142 mmol/L (ref 136–145)

## 2020-07-21 LAB — LIPID PANEL
CHOL/HDL Ratio: 3.2 (ref 0.0–5.0)
Chol/HDL Ratio: 3.2 (ref 0.0–5.0)
Cholesterol, Total: 278 MG/DL — ABNORMAL HIGH (ref <200)
Cholesterol, total: 278 MG/DL — ABNORMAL HIGH (ref <200)
HDL Cholesterol: 87 MG/DL
HDL: 87 MG/DL
LDL Calculated: 174.6 MG/DL — ABNORMAL HIGH (ref 0–100)
LDL, calculated: 174.6 MG/DL — ABNORMAL HIGH (ref 0–100)
Triglyceride: 82 MG/DL (ref <150)
Triglycerides: 82 MG/DL (ref <150)
VLDL Cholesterol Calculated: 16.4 MG/DL
VLDL, calculated: 16.4 MG/DL

## 2020-07-21 LAB — TSH 3RD GENERATION
TSH: 0.68 u[IU]/mL (ref 0.36–3.74)
TSH: 0.68 u[IU]/mL (ref 0.36–3.74)

## 2020-07-21 LAB — COMPREHENSIVE METABOLIC PANEL
ALT: 33 U/L (ref 12–78)
AST: 16 U/L (ref 15–37)
Albumin/Globulin Ratio: 1.5 (ref 1.1–2.2)
Albumin: 4 g/dL (ref 3.5–5.0)
Alkaline Phosphatase: 64 U/L (ref 45–117)
Anion Gap: 7 mmol/L (ref 5–15)
BUN: 24 MG/DL — ABNORMAL HIGH (ref 6–20)
Bun/Cre Ratio: 25 — ABNORMAL HIGH (ref 12–20)
CO2: 28 mmol/L (ref 21–32)
Calcium: 9.3 MG/DL (ref 8.5–10.1)
Chloride: 107 mmol/L (ref 97–108)
Creatinine: 0.96 MG/DL (ref 0.55–1.02)
EGFR IF NonAfrican American: 56 mL/min/{1.73_m2} — ABNORMAL LOW (ref >60)
GFR African American: >60 mL/min/{1.73_m2} (ref >60)
Globulin: 2.6 g/dL (ref 2.0–4.0)
Glucose: 75 mg/dL (ref 65–100)
Potassium: 4 mmol/L (ref 3.5–5.1)
Sodium: 142 mmol/L (ref 136–145)
Total Bilirubin: 0.6 MG/DL (ref 0.2–1.0)
Total Protein: 6.6 g/dL (ref 6.4–8.2)

## 2020-07-22 ENCOUNTER — Ambulatory Visit: Admit: 2020-07-22 | Payer: MEDICARE | Attending: Neurology | Primary: Internal Medicine

## 2020-07-22 ENCOUNTER — Ambulatory Visit: Attending: Neurology | Primary: Internal Medicine

## 2020-07-22 DIAGNOSIS — G7000 Myasthenia gravis without (acute) exacerbation: Secondary | ICD-10-CM

## 2020-07-22 MED ORDER — PANTOPRAZOLE 40 MG TAB, DELAYED RELEASE
40 mg | ORAL_TABLET | Freq: Every day | ORAL | 1 refills | Status: DC
Start: 2020-07-22 — End: 2020-09-28

## 2020-07-22 MED ORDER — PYRIDOSTIGMINE BROMIDE 30 MG TABLET
30 mg | Freq: Three times a day (TID) | ORAL | 3 refills | Status: DC
Start: 2020-07-22 — End: 2020-07-25

## 2020-07-22 MED ORDER — PREDNISONE 5 MG TAB
5 mg | ORAL_TABLET | Freq: Every day | ORAL | 1 refills | Status: DC
Start: 2020-07-22 — End: 2020-09-28

## 2020-07-22 MED ORDER — MYCOPHENOLATE 500 MG TAB
500 mg | ORAL_TABLET | Freq: Two times a day (BID) | ORAL | 2 refills | Status: DC
Start: 2020-07-22 — End: 2021-03-30

## 2020-07-22 NOTE — Progress Notes (Signed)
No chief complaint on file.      HISTORY OF PRESENT ILLNESS  Jillian Bean came back for follow-up.  She states that she is doing quite well.  Denies any visual symptoms or any general symptoms, muscle weakness/fatigue, shortness of breath etc.  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  She tested strongly positive for acetylcholine receptor antibodies and was sent for neurological evaluation.  Denies any generalized muscle weakness, shortness of breath, exercise intolerance or limitations with activities of daily living.  She was started on prednisone 20 mg daily which has cleared up the double vision.  Also taking pyridostigmine 60 mg 3 times daily but is not sure if it is doing anything.  Came in to discuss further treatment options.       Allergies   Allergen Reactions   ??? Atorvastatin Myalgia   ??? Zocor [Simvastatin] Other (comments) and Diarrhea     Other reaction(s): Adverse reaction to substance   Myalgias     ??? Celebrex [Celecoxib] Other (comments)     Aggitation   ??? Gabapentin Other (comments)     FELT UNSTEADY ON MY FEET   ??? Levaquin [Levofloxacin] Palpitations   ??? Nexium [Esomeprazole Magnesium] Diarrhea   ??? Other Medication Swelling     Latanoprost drops   ??? Oxycontin [Oxycodone] Itching   ??? Pravastatin Nausea and Vomiting   ??? Statins-Hmg-Coa Reductase Inhibitors Other (comments)     lymphacytic colitis     ??? Yellow Dye Hives       PHYSICAL EXAMINATION:    Visit Vitals  BP 127/78   Resp 18   SpO2 98%         NEUROLOGICAL EXAMINATION:     Mental Status:   Alert and oriented to person, place, and time.  Attention span and concentration are normal. Speech is fluent .      Cranial Nerves:    II, III, IV, VI:  Visual acuity grossly  intact. Visual fields are normal.    Pupils are equal, round, and reactive.    Extra-ocular movements are full and fluid.    V-XII: Hearing is grossly intact.  Facial features are symmetric, with normal sensation and strength.  The palate rises symmetrically and the tongue protrudes midline.        Motor Examination: Normal tone, bulk, and strength.     Sensory exam:  Normal throughout     Coordination:  Finger to nose and rapid arm movement testing was normal.   No resting or intention tremor    Gait and Station:  Steady.  Normal arm swing. No muscle wasting or fasiculations noted.        LABS / IMAGING  CT Results (most recent):  Results from Hospital Encounter encounter on 11/25/19    CT HEAD WO CONT    Narrative  EXAM: CT HEAD WO CONT    INDICATION: Pain in the left eye and headache.    COMPARISON: 10/09/2019.    CONTRAST: None.    TECHNIQUE: Unenhanced CT of the head was performed using 5 mm images. Brain and  bone windows were generated. Coronal and sagittal reformats. CT dose reduction  was achieved through use of a standardized protocol tailored for this  examination and automatic exposure control for dose modulation.    FINDINGS:  The ventricles and sulci are normal in size, shape and configuration.. There is  no significant white matter disease. There is no intracranial hemorrhage,  extra-axial collection, or mass effect. The basilar cisterns are open. No CT  evidence of acute infarct.    The bone windows demonstrate no abnormalities. The visualized portions of the  paranasal sinuses and mastoid air cells are clear.    Impression  No acute intracranial process identified by noncontrast CT    CT chest no evidence of thymic mass    Positive acetylcholine receptor binding antibody 18.60 (high)  Acetylcholine receptor blocking antibody 33 (high)  Modulating antibody 48 (high), striation antibody negative  TSH and T3 normal, T4- 2.1 (high)      ASSESSMENT    ICD-10-CM ICD-9-CM    1. 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  We may be able to discontinue it in 4 weeks if there is no recurrence of symptoms.  Continue CellCept 500 mg twice daily and pyridostigmine 30 mg 3 times daily  Continue pantoprazole while she is on prednisone  Refills were provided  Follow periodically      Link Snuffer, MD  Diplomate, American Board of Psychiatry & Neurology (Neurology)  Diplomate, American Board of Psychiatry & Neurology (Clinical Neurophysiology)  Diplomate, American Board of Electrodiagnostic Medicine

## 2020-07-25 ENCOUNTER — Encounter

## 2020-07-25 MED ORDER — PYRIDOSTIGMINE BROMIDE 60 MG TAB
60 mg | ORAL_TABLET | Freq: Three times a day (TID) | ORAL | 3 refills | Status: DC
Start: 2020-07-25 — End: 2020-12-20

## 2020-08-24 ENCOUNTER — Encounter: Attending: Internal Medicine | Primary: Internal Medicine

## 2020-09-20 MED ORDER — SYNTHROID 112 MCG TABLET
112 mcg | ORAL_TABLET | Freq: Every day | ORAL | 1 refills | Status: DC
Start: 2020-09-20 — End: 2020-12-20

## 2020-09-20 NOTE — Telephone Encounter (Signed)
Telephone Encounter by Renee Harder, LPN at 66/06/30 1601                Author: Renee Harder, LPN  Service: --  Author Type: Licensed Nurse       Filed: 09/20/20 1207  Encounter Date: 09/20/2020  Status: Signed          Editor: Renee Harder, LPN (Licensed Nurse)          From: Merrilee JanskyFlora Lipps, MDSent: 09/20/2020 11:55 AM ESTSubject: Prescription request - Mail order servicePlease send prescription  for Synthroid 0.112 MG (37-months)  to [facility]. 1 [phone] to regulating the level, current prescription-one month supply is at [facility] with no refills.  ThanksFlossie ([name]) Pearline Cables

## 2020-09-22 ENCOUNTER — Ambulatory Visit: Admit: 2020-09-22 | Discharge: 2020-09-22 | Payer: MEDICARE | Attending: Specialist | Primary: Internal Medicine

## 2020-09-22 ENCOUNTER — Encounter: Admit: 2020-09-22 | Payer: MEDICARE | Primary: Internal Medicine

## 2020-09-22 ENCOUNTER — Ambulatory Visit: Attending: Specialist | Primary: Internal Medicine

## 2020-09-22 DIAGNOSIS — M25562 Pain in left knee: Secondary | ICD-10-CM

## 2020-09-22 MED ORDER — CEPHALEXIN 500 MG CAP
500 mg | ORAL_CAPSULE | ORAL | 2 refills | Status: DC
Start: 2020-09-22 — End: 2020-12-20

## 2020-09-22 NOTE — Progress Notes (Signed)
Jillian Bean (DOB: [birth year]) is a 80 y.o. female, patient, here for evaluation of the following chief complaint(s):  Knee Pain (left)       HPI:    Left knee pain and swelling chief complaint.  Patient missed a step about a week ago and immediately afterwards noticed knee pain.  Swelled up over the next several days.  No constitutional symptoms.  She does take aspirin on a regular basis.    Allergies   Allergen Reactions   ??? Atorvastatin Myalgia   ??? Zocor [Simvastatin] Other (comments) and Diarrhea     Other reaction(s): Adverse reaction to substance   Myalgias     ??? Celebrex [Celecoxib] Other (comments)     Aggitation   ??? Gabapentin Other (comments)     FELT UNSTEADY ON MY FEET   ??? Levaquin [Levofloxacin] Palpitations   ??? Nexium [Esomeprazole Magnesium] Diarrhea   ??? Other Medication Swelling     Latanoprost drops   ??? Oxycontin [Oxycodone] Itching   ??? Pravastatin Nausea and Vomiting   ??? Statins-Hmg-Coa Reductase Inhibitors Other (comments)     lymphacytic colitis     ??? Yellow Dye Hives       Current Outpatient Medications   Medication Sig   ??? Synthroid 112 mcg tablet Take 1 Tablet by mouth Daily (before breakfast).   ??? pyridostigmine (MESTINON) 60 mg tablet Take 0.5 Tablets by mouth three (3) times daily.   ??? pantoprazole (PROTONIX) 40 mg tablet Take 1 Tablet by mouth daily.   ??? mycophenolate (CELLCEPT) 500 mg tablet Take 1 Tablet by mouth two (2) times a day.   ??? predniSONE (DELTASONE) 5 mg tablet Take 1 Tablet by mouth daily.   ??? zoledronic acid (ZOMETA IV) by IntraVENous route every 6 months.   ??? ezetimibe (ZETIA) 10 mg tablet Take 1 Tablet by mouth daily.   ??? multivitamin (ONE A DAY) tablet Take 1 Tab by mouth daily.   ??? cholecalciferol, vitamin D3, (VITAMIN D3 PO) Take 4,000 Units by mouth daily.   ??? LOW-DOSE ASPIRIN PO Take 162 mg by mouth daily. Taking 2 tab daily   ??? docusate sodium (COLACE) 100 mg capsule Take 100 mg by mouth daily.     No current facility-administered medications for this visit.        Past Medical History:   Diagnosis Date   ??? Anemia    ??? Back pain 09/01/2007   ??? Cancer (HCC)     R breast   ??? Chronic pain     BACK PAIN   ??? Coagulation disorder (HCC)     IRON DEFFICIENCY ANEMIA   ??? Colonic polyps 08/31/1998   ??? 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 DVT (deep venous thrombosis) (HCC) 01/2017   ??? Hiatal hernia 07/11/2011    Dr.sobieski   ??? Hypercholesteremia 09/01/2007   ??? Hypothyroidism 09/01/2003   ??? 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 Nausea & vomiting    ??? OA (osteoarthritis) 09/01/2007   ??? Other ill-defined conditions(799.89)     VERTIGO   ??? Plantar fasciitis 09/01/2007   ??? S/P colonoscopy 10/10/2007   ??? Thromboembolus (HCC) 01/2017    RIGHT LEG   ??? Thyroid disease         Past Surgical History:   Procedure Laterality Date   ??? COLONOSCOPY N/A 11/12/2016    COLONOSCOPY performed by Clarnce Flock, MD  at [facility]   ??? COLONOSCOPY,DIAGNOSTIC  11/12/2016        ??? ENDOSCOPY, COLON, DIAGNOSTIC  7/12/18/08    7/05 Dr Maudry Diego   ??? HX BREAST LUMPECTOMY Right 2016   ??? HX BREAST REDUCTION  1993   ??? HX CATARACT REMOVAL Right 01/21/2018   ??? HX CATARACT REMOVAL Left 03/11/2018   ??? HX COLONOSCOPY  2014    Dr Illene Silver   ??? HX GI      COLONOSCOPY   ??? HX HEENT  04/07/10    thyroid biopsy neg dr Leward Quan   ??? HX HEENT      wisdom teeth extraction   ??? HX HYSTERECTOMY  1975    PARTIAL   ??? HX KNEE REPLACEMENT Left 08/22/09   ??? HX KNEE REPLACEMENT Right 08/13/12   ??? HX ORTHOPAEDIC Right 11/26/2016    cleaned infection in joint   ??? HX ORTHOPAEDIC Right 03/2017    revision total knee replacement   ??? HX OTHER SURGICAL  2013    MINIMALLY INVASIVE LUMBAR DECOMPRESSION   ??? NEUROLOGICAL PROCEDURE UNLISTED  2013    LUMBAR DECOMPRESSION   ??? UPPER GI ENDOSCOPY,BIOPSY  11/12/2016            Family History   Problem Relation Age of Onset   ??? Cancer Mother         Breast   ??? Hypertension Mother    ??? Heart Failure Father         MI   ???  Heart Attack Father    ??? Hypertension Father    ??? Mult Sclerosis Brother    ??? Anesth Problems Neg Hx         Social History     Socioeconomic History   ??? Marital status: MARRIED     Spouse name: Not on file   ??? Number of children: Not on file   ??? Years of education: Not on file   ??? Highest education level: Not on file   Occupational History   ??? Not on file   Tobacco Use   ??? Smoking status: Never Smoker   ??? Smokeless tobacco: Never Used   Substance and Sexual Activity   ??? Alcohol use: No   ??? Drug use: No   ??? Sexual activity: Not Currently   Other Topics Concern   ??? Not on file   Social History Narrative   ??? Not on file     Social Determinants of Health     Financial Resource Strain:    ??? Difficulty of Paying Living Expenses: Not on file   Food Insecurity:    ??? Worried About Running Out of Food in the Last Year: Not on file   ??? Ran Out of Food in the Last Year: Not on file   Transportation Needs:    ??? Lack of Transportation (Medical): Not on file   ??? Lack of Transportation (Non-Medical): Not on file   Physical Activity:    ??? Days of Exercise per Week: Not on file   ??? Minutes of Exercise per Session: Not on file   Stress:    ??? Feeling of Stress : Not on file   Social Connections:    ??? Frequency of Communication with Friends and Family: Not on file   ??? Frequency of Social Gatherings with Friends and Family: Not on file   ??? Attends Religious Services: Not on file   ??? Active Member of Clubs or Organizations: Not on file   ???  Attends Archivist Meetings: Not on file   ??? Marital Status: Not on file   Intimate Partner Violence:    ??? Fear of Current or Ex-Partner: Not on file   ??? Emotionally Abused: Not on file   ??? Physically Abused: Not on file   ??? Sexually Abused: Not on file   Housing Stability:    ??? Unable to Pay for Housing in the Last Year: Not on file   ??? Number of Places Lived in the Last Year: Not on file   ??? Unstable Housing in the Last Year: Not on file             Vitals:  Ht 5\' 5"  (1.651 m)    Wt 147 lb  (66.7 kg)    LMP 04/13/2010    BMI 24.46 kg/m??    Body mass index is 24.46 kg/m??.    PHYSICAL EXAM:  Examined the patient's left knee.  She has a large effusion.  It is not warm.  Extensor mechanism is intact.  No knee instability is noted.    IMAGING:  XR Results (most recent):  Results from Appointment encounter on 09/22/20    XR KNEE LT 3 V    Narrative  3 x-ray views of patient's left knee.  AP both knees.  Lateral left knee.  Merchant both knees.  Her knee implant appears well fixed to the bone small effusion is noted.        ASSESSMENT/PLAN:  1. Acute pain of left knee  -     XR KNEE LT 3 V; Future  2. S/P total knee arthroplasty, left  -     XR KNEE LT 3 V; Future  3. Effusion, left knee    I am going to observe this for the next 3 weeks.  She will use Tylenol and ice.  Likely small hemarthrosis.  Did not appear infectious.  If still symptomatic in 3 weeks had like her to call to make follow-up appointment for knee aspiration.    An electronic signature was used to authenticate this note.  --Zollie Beckers, MD

## 2020-09-28 ENCOUNTER — Ambulatory Visit: Admit: 2020-09-28 | Payer: MEDICARE | Attending: Internal Medicine | Primary: Internal Medicine

## 2020-09-28 ENCOUNTER — Ambulatory Visit: Attending: Internal Medicine | Primary: Internal Medicine

## 2020-09-28 DIAGNOSIS — E785 Hyperlipidemia, unspecified: Secondary | ICD-10-CM

## 2020-09-28 MED ORDER — NEXLETOL 180 MG TABLET
180 mg | Freq: Every day | ORAL | 0 refills | Status: DC
Start: 2020-09-28 — End: 2020-12-20

## 2020-09-28 NOTE — Progress Notes (Signed)
HISTORY OF PRESENT ILLNESS  Jillian Bean is a 80 y.o. female.  HPI  Seen to have forms filled out. She and her husband are applying to live at the [facility] in [place], [place].  This is the first step in a multistep process.  She is certainly a good candidate for independent living as she is very capable of managing her medications and forms were filled out.    Dyslipidemia, intolerant of statins. On Zetia.  Cholesterol is still quite elevated. Discussed options. Will see if Nexletol is cost manageable with her plan.  Discussed it is not generic, but less likely to have side effects than the statins.    Thyroid. Remains on Synthroid 112 mcg.  Thyroid levels have been good recently.    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  Currently on Mestinon 60 mg, half tab t.i.d., and Cellcept 500 mg b.i.d.      Review of Systems   Constitutional: Negative for chills, fever and weight loss.   Respiratory: Negative for cough, shortness of breath and wheezing.    Cardiovascular: Negative for chest pain, palpitations, orthopnea, leg swelling and PND.   Gastrointestinal: Negative for heartburn and nausea.   Musculoskeletal: Negative for falls and myalgias.   Neurological: Negative for dizziness and headaches.   Psychiatric/Behavioral: Negative for depression and memory loss.       Physical Exam  Vitals and nursing note reviewed.   Constitutional:       Appearance: She is well-developed.   HENT:      Head: Normocephalic and atraumatic.   Neck:      Thyroid: No thyromegaly.      Vascular: No carotid bruit.   Cardiovascular:      Rate and Rhythm: Normal rate and regular rhythm.      Heart sounds: Normal heart sounds, S1 normal and S2 normal. No murmur heard.      Pulmonary:      Effort: Pulmonary effort is normal. No respiratory distress.      Breath sounds: Normal breath sounds. No wheezing or rales.   Musculoskeletal:      Cervical back: Normal range of motion and neck supple.    Neurological:      Mental Status: She is alert and oriented to person, place, and time.   Psychiatric:         Behavior: Behavior normal.         ASSESSMENT and PLAN  Diagnoses and all orders for this visit:    1. Dyslipidemia, goal LDL below 100-statin intolerant not fully controlled on zetia  Discussed new med if cost manageable    -     bempedoic acid (Nexletol) 180 mg tab; Take 180 mg by mouth daily.    2. Myasthenia gravis (HCC)-stable on meds with ophthalmology    3. Malignant neoplasm of right breast in female, estrogen receptor positive, unspecified site of breast (HCC)-no recurrence last mammo in 2020

## 2020-11-22 ENCOUNTER — Ambulatory Visit: Admit: 2020-11-22 | Payer: MEDICARE | Attending: Neurology | Primary: Internal Medicine

## 2020-11-22 ENCOUNTER — Ambulatory Visit: Attending: Neurology | Primary: Internal Medicine

## 2020-11-22 DIAGNOSIS — G7000 Myasthenia gravis without (acute) exacerbation: Secondary | ICD-10-CM

## 2020-11-22 NOTE — Progress Notes (Signed)
Chief Complaint   Patient presents with   ??? Neurologic Problem       HISTORY OF PRESENT ILLNESS  Sylver E Gasca came back for follow-up.  She states that she is doing quite well.  Denies any visual symptoms or any general symptoms, muscle weakness/fatigue, shortness of breath etc.  Not taking prednisone anymore.  Continues to take mycophenolate 500 mg twice daily and pyridostigmine 30 mg 2- 3 times daily.  She has not noticed any difference if she misses a dose.    RECAP  She started experiencing double vision in February of 2021, saw ophthalmology followed by neuro-ophthalmology and ocular myasthenia gravis was suspected.  She tested strongly positive for acetylcholine receptor antibodies and was sent for neurological evaluation.  Denies any generalized muscle weakness, shortness of breath, exercise intolerance or limitations with activities of daily living.  She was started on prednisone 20 mg daily which has cleared up the double vision.  Also taking pyridostigmine 60 mg 3 times daily but is not sure if it is doing anything.  Came in to discuss further treatment options.       Allergies   Allergen Reactions   ??? Atorvastatin Myalgia   ??? Zocor [Simvastatin] Other (comments) and Diarrhea     Other reaction(s): Adverse reaction to substance   Myalgias     ??? Celebrex [Celecoxib] Other (comments)     Aggitation   ??? Gabapentin Other (comments)     FELT UNSTEADY ON MY FEET   ??? Levaquin [Levofloxacin] Palpitations   ??? Nexium [Esomeprazole Magnesium] Diarrhea   ??? Other Medication Swelling     Latanoprost drops   ??? Oxycontin [Oxycodone] Itching   ??? Pravastatin Nausea and Vomiting   ??? Statins-Hmg-Coa Reductase Inhibitors Other (comments)     lymphacytic colitis     ??? Yellow Dye Hives       PHYSICAL EXAMINATION:    Visit Vitals  BP (!) 140/78   Pulse 68   Resp 16   Ht 5\' 5"  (1.651 m)   Wt 148 lb (67.1 kg)   SpO2 99%   BMI 24.63 kg/m??         NEUROLOGICAL EXAMINATION:     Mental Status:   Alert and oriented to person, place,  and time.  Attention span and concentration are normal. Speech is fluent .      Cranial Nerves:    II, III, IV, VI:  Visual acuity grossly intact. Visual fields are normal.    Pupils are equal, round, and reactive.    Extra-ocular movements are full and fluid.    V-XII: Hearing is grossly intact.  Facial features are symmetric, with normal sensation and strength.  The palate rises symmetrically and the tongue protrudes midline.        Motor Examination: Normal tone, bulk, and strength.     Sensory exam:  Normal throughout     Coordination:  Finger to nose and rapid arm movement testing was normal.   No resting or intention tremor    Gait and Station:  Steady.  Normal arm swing. No muscle wasting or fasiculations noted.        LABS / IMAGING  CT Results (most recent):  Results from Hospital Encounter encounter on 11/25/19    CT HEAD WO CONT    Narrative  EXAM: CT HEAD WO CONT    INDICATION: Pain in the left eye and headache.    COMPARISON: 10/09/2019.    CONTRAST: None.    TECHNIQUE: Unenhanced  CT of the head was performed using 5 mm images. Brain and  bone windows were generated. Coronal and sagittal reformats. CT dose reduction  was achieved through use of a standardized protocol tailored for this  examination and automatic exposure control for dose modulation.    FINDINGS:  The ventricles and sulci are normal in size, shape and configuration.. There is  no significant white matter disease. There is no intracranial hemorrhage,  extra-axial collection, or mass effect. The basilar cisterns are open. No CT  evidence of acute infarct.    The bone windows demonstrate no abnormalities. The visualized portions of the  paranasal sinuses and mastoid air cells are clear.    Impression  No acute intracranial process identified by noncontrast CT    CT chest no evidence of thymic mass    Positive acetylcholine receptor binding antibody 18.60 (high)  Acetylcholine receptor blocking antibody 33 (high)  Modulating antibody 48  (high), striation antibody negative  TSH and T3 normal, T4- 2.1 (high)      ASSESSMENT    ICD-10-CM ICD-9-CM    1. Myasthenia gravis ([place])  G70.00 358.00        DISCUSSION  Ms. 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  She is doing quite well and asymptomatic clinically  She has been off prednisone without any problems  May hold off on taking pyridostigmine continue CellCept 500 Mg twice daily    She is planning to move to [facility] in the near future and will try to find a local neurologist there  I will continue to provide her with care until she finds one and she can reach out to me as needed    Link Snuffer, MD  Diplomate, American Board of Psychiatry & Neurology (Neurology)  Diplomate, American Board of Psychiatry & Neurology (Clinical Neurophysiology)  Diplomate, American Board of Electrodiagnostic Medicine

## 2020-11-22 NOTE — Progress Notes (Signed)
Jillian Bean presents today to follow up Myasthenia Gravis.

## 2020-12-19 ENCOUNTER — Ambulatory Visit: Payer: MEDICARE | Attending: Internal Medicine | Primary: Internal Medicine

## 2020-12-20 ENCOUNTER — Ambulatory Visit: Admit: 2020-12-20 | Payer: MEDICARE | Attending: Internal Medicine | Primary: Internal Medicine

## 2020-12-20 ENCOUNTER — Ambulatory Visit: Attending: Internal Medicine | Primary: Internal Medicine

## 2020-12-20 DIAGNOSIS — E034 Atrophy of thyroid (acquired): Secondary | ICD-10-CM

## 2020-12-20 DIAGNOSIS — E785 Hyperlipidemia, unspecified: Secondary | ICD-10-CM

## 2020-12-20 MED ORDER — SYNTHROID 112 MCG TABLET
112 mcg | ORAL_TABLET | Freq: Every day | ORAL | 2 refills | Status: DC
Start: 2020-12-20 — End: 2020-12-26

## 2020-12-20 MED ORDER — NEXLIZET 180 MG-10 MG TABLET
180-10 mg | Freq: Every day | ORAL | 3 refills | Status: DC
Start: 2020-12-20 — End: 2020-12-26

## 2020-12-20 MED ORDER — NEXLETOL 180 MG TABLET
180 mg | Freq: Every day | ORAL | 2 refills | Status: DC
Start: 2020-12-20 — End: 2020-12-20

## 2020-12-20 NOTE — Progress Notes (Signed)
HISTORY OF PRESENT ILLNESS  Jillian Bean is a 80 y.o. female.  HPI  She is moving into a retirement community in [place] outside of [place].  Notes that the previous paperwork we did has all been accepted and she is scheduled to move in on June 1st.  She has sold her place here, feels tired from all of the packing.  She has tolerated Nexletol without any muscle aches or myalgia symptoms.  She does have some arthritis in hands and a little stiffness.  Discussed combining Nexletol with Zetia for simplicity and did write for Nexlizet.  Discussed thyroid.  Will recheck levels.  She has been working with neuroophthalmology and has been able to come off of Mestinon and remains on just Mycophenolate.  Does note occasional dizziness when first getting up, has to sit on the bed for a minute, not when walking or exerting herself.      Review of Systems   Constitutional: Negative for chills, fever and weight loss.   Respiratory: Negative for cough, shortness of breath and wheezing.    Cardiovascular: Negative for chest pain, palpitations, orthopnea, leg swelling and PND.   Gastrointestinal: Negative for abdominal pain, heartburn and nausea.   Musculoskeletal: Positive for joint pain (joints are stiff). Negative for back pain, falls and myalgias.   Neurological: Positive for dizziness. Negative for headaches.       Physical Exam  Vitals and nursing note reviewed.   Constitutional:       Appearance: She is well-developed.   HENT:      Head: Normocephalic and atraumatic.   Neck:      Thyroid: No thyromegaly.      Vascular: No carotid bruit.   Cardiovascular:      Rate and Rhythm: Normal rate and regular rhythm.      Heart sounds: Normal heart sounds, S1 normal and S2 normal. No murmur heard.      Pulmonary:      Effort: Pulmonary effort is normal. No respiratory distress.      Breath sounds: Normal breath sounds. No wheezing or rales.   Musculoskeletal:      Cervical back: Normal range of motion and neck supple.    Neurological:      Mental Status: She is alert and oriented to person, place, and time.   Psychiatric:         Behavior: Behavior normal.         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  -     TSH 3RD GENERATION; Future    2. Dyslipidemia, goal LDL below 100  -     bempedoic acid-ezetimibe (Nexlizet) 180-10 mg tab; Take 1 Tablet by mouth daily.  -     METABOLIC PANEL, COMPREHENSIVE; Future  -     LIPID PANEL; Future    3. Myasthenia gravis ([employer])  -     CBC WITH AUTOMATED DIFF; Future      the following changes in treatment are made: change nexletol to nexlizet with the zetia combined to help with cost  I wish her well  [place] and with t he  move

## 2020-12-20 NOTE — Addendum Note (Signed)
Addendum  Note by Bobbye Charleston at 12/20/20 1030                Author: Bobbye Charleston  Service: --  Author Type: Technician       Filed: 12/20/20 1127  Encounter Date: 12/20/2020  Status: Signed          Editor: Bobbye Charleston (Technician)          Addended by: Bobbye Charleston on: 12/20/2020 11:27 AM    Modules accepted: Orders

## 2020-12-20 NOTE — Progress Notes (Signed)
Message sent about labs

## 2020-12-21 LAB — METABOLIC PANEL, COMPREHENSIVE
A-G Ratio: 1.6 (ref 1.1–2.2)
ALT (SGPT): 26 U/L (ref 12–78)
AST (SGOT): 27 U/L (ref 15–37)
Albumin: 4.4 g/dL (ref 3.5–5.0)
Alk. phosphatase: 52 U/L (ref 45–117)
Anion gap: 5 mmol/L (ref 5–15)
BUN/Creatinine ratio: 21 — ABNORMAL HIGH (ref 12–20)
BUN: 18 MG/DL (ref 6–20)
Bilirubin, total: 0.5 MG/DL (ref 0.2–1.0)
CO2: 28 mmol/L (ref 21–32)
Calcium: 9.6 MG/DL (ref 8.5–10.1)
Chloride: 106 mmol/L (ref 97–108)
Creatinine: 0.85 MG/DL (ref 0.55–1.02)
GFR est AA: >60 mL/min/{1.73_m2} (ref >60)
GFR est non-AA: >60 mL/min/{1.73_m2} (ref >60)
Globulin: 2.7 g/dL (ref 2.0–4.0)
Glucose: 93 mg/dL (ref 65–100)
Potassium: 4.2 mmol/L (ref 3.5–5.1)
Protein, total: 7.1 g/dL (ref 6.4–8.2)
Sodium: 139 mmol/L (ref 136–145)

## 2020-12-21 LAB — CBC WITH AUTOMATED DIFF
ABS. BASOPHILS: 0 10*3/uL (ref 0.0–0.1)
ABS. EOSINOPHILS: 0.2 10*3/uL (ref 0.0–0.4)
ABS. IMM. GRANS.: 0 10*3/uL (ref 0.00–0.04)
ABS. LYMPHOCYTES: 1.1 10*3/uL (ref 0.8–3.5)
ABS. MONOCYTES: 0.4 10*3/uL (ref 0.0–1.0)
ABS. NEUTROPHILS: 4.3 10*3/uL (ref 1.8–8.0)
ABSOLUTE NRBC: 0 10*3/uL (ref 0.00–0.01)
BASOPHILS: 1 % (ref 0–1)
EOSINOPHILS: 3 % (ref 0–7)
HCT: 39.8 % (ref 35.0–47.0)
HGB: 11.9 g/dL (ref 11.5–16.0)
IMMATURE GRANULOCYTES: 1 % — ABNORMAL HIGH (ref 0.0–0.5)
LYMPHOCYTES: 18 % (ref 12–49)
MCH: 28 PG (ref 26.0–34.0)
MCHC: 29.9 g/dL — ABNORMAL LOW (ref 30.0–36.5)
MCV: 93.6 FL (ref 80.0–99.0)
MONOCYTES: 7 % (ref 5–13)
MPV: 9.6 FL (ref 8.9–12.9)
NEUTROPHILS: 70 % (ref 32–75)
NRBC: 0 PER 100 WBC
PLATELET: 308 10*3/uL (ref 150–400)
RBC: 4.25 M/uL (ref 3.80–5.20)
RDW: 12.3 % (ref 11.5–14.5)
WBC: 6 10*3/uL (ref 3.6–11.0)

## 2020-12-21 LAB — LIPID PANEL
CHOL/HDL Ratio: 2.3 (ref 0.0–5.0)
Chol/HDL Ratio: 2.3 (ref 0.0–5.0)
Cholesterol, Total: 152 MG/DL (ref <200)
Cholesterol, total: 152 MG/DL (ref <200)
HDL Cholesterol: 65 MG/DL
HDL: 65 MG/DL
LDL Calculated: 70.6 MG/DL (ref 0–100)
LDL, calculated: 70.6 MG/DL (ref 0–100)
Triglyceride: 82 MG/DL (ref <150)
Triglycerides: 82 MG/DL (ref <150)
VLDL Cholesterol Calculated: 16.4 MG/DL
VLDL, calculated: 16.4 MG/DL

## 2020-12-21 LAB — TSH 3RD GENERATION
TSH: 0.41 u[IU]/mL (ref 0.36–3.74)
TSH: 0.41 u[IU]/mL (ref 0.36–3.74)

## 2020-12-21 LAB — COMPREHENSIVE METABOLIC PANEL
ALT: 26 U/L (ref 12–78)
AST: 27 U/L (ref 15–37)
Albumin/Globulin Ratio: 1.6 (ref 1.1–2.2)
Albumin: 4.4 g/dL (ref 3.5–5.0)
Alkaline Phosphatase: 52 U/L (ref 45–117)
Anion Gap: 5 mmol/L (ref 5–15)
BUN: 18 MG/DL (ref 6–20)
Bun/Cre Ratio: 21 — ABNORMAL HIGH (ref 12–20)
CO2: 28 mmol/L (ref 21–32)
Calcium: 9.6 MG/DL (ref 8.5–10.1)
Chloride: 106 mmol/L (ref 97–108)
Creatinine: 0.85 MG/DL (ref 0.55–1.02)
EGFR IF NonAfrican American: >60 mL/min/{1.73_m2} (ref >60)
GFR African American: >60 mL/min/{1.73_m2} (ref >60)
Globulin: 2.7 g/dL (ref 2.0–4.0)
Glucose: 93 mg/dL (ref 65–100)
Potassium: 4.2 mmol/L (ref 3.5–5.1)
Sodium: 139 mmol/L (ref 136–145)
Total Bilirubin: 0.5 MG/DL (ref 0.2–1.0)
Total Protein: 7.1 g/dL (ref 6.4–8.2)

## 2020-12-21 LAB — CBC WITH AUTO DIFFERENTIAL
Basophils %: 1 % (ref 0–1)
Basophils Absolute: 0 10*3/uL (ref 0.0–0.1)
Eosinophils %: 3 % (ref 0–7)
Eosinophils Absolute: 0.2 10*3/uL (ref 0.0–0.4)
Granulocyte Absolute Count: 0 10*3/uL (ref 0.00–0.04)
Hematocrit: 39.8 % (ref 35.0–47.0)
Hemoglobin: 11.9 g/dL (ref 11.5–16.0)
Immature Granulocytes: 1 % — ABNORMAL HIGH (ref 0.0–0.5)
Lymphocytes %: 18 % (ref 12–49)
Lymphocytes Absolute: 1.1 10*3/uL (ref 0.8–3.5)
MCH: 28 PG (ref 26.0–34.0)
MCHC: 29.9 g/dL — ABNORMAL LOW (ref 30.0–36.5)
MCV: 93.6 FL (ref 80.0–99.0)
MPV: 9.6 FL (ref 8.9–12.9)
Monocytes %: 7 % (ref 5–13)
Monocytes Absolute: 0.4 10*3/uL (ref 0.0–1.0)
NRBC Absolute: 0 10*3/uL (ref 0.00–0.01)
Neutrophils %: 70 % (ref 32–75)
Neutrophils Absolute: 4.3 10*3/uL (ref 1.8–8.0)
Nucleated RBCs: 0 PER 100 WBC
Platelets: 308 10*3/uL (ref 150–400)
RBC: 4.25 M/uL (ref 3.80–5.20)
RDW: 12.3 % (ref 11.5–14.5)
WBC: 6 10*3/uL (ref 3.6–11.0)

## 2020-12-24 ENCOUNTER — Encounter

## 2020-12-26 ENCOUNTER — Ambulatory Visit: Admit: 2020-12-26 | Discharge: 2020-12-26 | Payer: MEDICARE | Attending: Specialist | Primary: Internal Medicine

## 2020-12-26 ENCOUNTER — Inpatient Hospital Stay: Admit: 2020-12-26 | Payer: MEDICARE | Primary: Internal Medicine

## 2020-12-26 ENCOUNTER — Ambulatory Visit: Attending: Specialist | Primary: Internal Medicine

## 2020-12-26 DIAGNOSIS — Z853 Personal history of malignant neoplasm of breast: Secondary | ICD-10-CM

## 2020-12-26 LAB — POC CHEM8
ANION GAP ISTAT,IAGAP: 10 mmol/L (ref 10–20)
Anion gap (POC): 10 mmol/L (ref 10–20)
CHLORIDE ISTAT,ICL: 105 mmol/L (ref 98–107)
CO2 (POC): 29.3 mmol/L (ref 21–32)
Calcium, ionized (POC): 1.21 mmol/L (ref 1.12–1.32)
Calcium, ionized, POC: 1.21 mmol/L (ref 1.12–1.32)
Chloride (POC): 105 mmol/L (ref 98–107)
Creatinine (POC): 0.79 mg/dL (ref 0.6–1.3)
GFR African American: >60 mL/min/{1.73_m2} (ref >60)
GFR Non-African American: >60 mL/min/{1.73_m2} (ref >60)
GFRAA, POC: >60 mL/min/{1.73_m2} (ref >60)
GFRNA, POC: >60 mL/min/{1.73_m2} (ref >60)
GLUCOSE ISTAT,IGLU: 82 mg/dL (ref 65–100)
Glucose (POC): 82 mg/dL (ref 65–100)
POC Creatinine: 0.79 mg/dL (ref 0.6–1.3)
POTASSIUM ISTAT,IK: 4.7 mmol/L (ref 3.5–5.1)
Potassium (POC): 4.7 mmol/L (ref 3.5–5.1)
SODIUM ISTAT,INA: 143 mmol/L (ref 136–145)
Sodium (POC): 143 mmol/L (ref 136–145)
TCO2 ISTAT,ITCO2: 29.3 mmol/L (ref 21–32)

## 2020-12-26 MED ORDER — SODIUM CHLORIDE 0.9 % IV
INTRAVENOUS | Status: AC
Start: 2020-12-26 — End: 2020-12-26
  Administered 2020-12-26: 16:00:00 via INTRAVENOUS

## 2020-12-26 MED ORDER — SYNTHROID 112 MCG TABLET
112 mcg | ORAL_TABLET | Freq: Every day | ORAL | 3 refills | Status: DC
Start: 2020-12-26 — End: 2021-02-20

## 2020-12-26 MED ORDER — NEXLIZET 180 MG-10 MG TABLET
180-10 mg | Freq: Every day | ORAL | 3 refills | Status: AC
Start: 2020-12-26 — End: ?

## 2020-12-26 MED ORDER — SODIUM CHLORIDE 0.9 % IV
4 mg/5 mL | Freq: Once | INTRAVENOUS | Status: AC
Start: 2020-12-26 — End: 2020-12-26
  Administered 2020-12-26: 16:00:00 via INTRAVENOUS

## 2020-12-26 MED FILL — SODIUM CHLORIDE 0.9 % IV: INTRAVENOUS | Qty: 1000

## 2020-12-26 MED FILL — ZOLEDRONIC ACID 4 MG/5 ML IV: 4 mg/5 mL | INTRAVENOUS | Qty: 4.38

## 2020-12-26 NOTE — Telephone Encounter (Signed)
Telephone Encounter by Renee Harder, LPN at 73/22/02 5427                Author: Renee Harder, LPN  Service: --  Author Type: Licensed Nurse       Filed: 12/26/20 0747  Encounter Date: 12/24/2020  Status: Signed          Editor: Renee Harder, LPN (Licensed Nurse)          From: Jillian JanskyFlora Bean, MDSent: 12/24/2020  8:01 AM EDTSubject: PrescriptionsOn my visit Tuesday, the 10th, you wrote me two paper  prescrptions --Nexlizet and Synthroid.Since the prescription for Nexletol has no refills and I only have 18 day supply, I called [facility] this morning how best to [facility]  They requested you send the prescriptions  electronically to them.    They can mail the prescriptions to [name].Thank for sending these prescriptions to [facility].  Jillian Bean

## 2020-12-26 NOTE — Progress Notes (Signed)
 [facility] Progress Note    Date: Dec 26, 2020    Name: Jillian Bean    MRN: [id number]         DOB: 06-04-1941    Jillian Bean Arrived ambulatory and in no distress for [facility].  Assessment was completed, no acute issues at this time, no new complaints voiced.  24 G PIV established to Left arm, + blood return. Peripheral Labs drawn and sent for processing.     Jillian Bean vitals were reviewed.  Visit Vitals  BP 122/64   Pulse 71   Temp 98.7 F (37.1 C)   Resp 18   Ht 5' 5 (1.651 m)   Wt 65.7 kg (144 lb 12.8 oz)   SpO2 96%   Breastfeeding No   BMI 24.10 kg/m       Lab results were obtained and reviewed.  Recent Results (from the past 12 hour(s))   POC CHEM8    Collection Time: 12/26/20 10:09 AM   Result Value Ref Range    Calcium, ionized (POC) 1.21 1.12 - 1.32 mmol/L    Sodium (POC) 143 136 - 145 mmol/L    Potassium (POC) 4.7 3.5 - 5.1 mmol/L    Chloride (POC) 105 98 - 107 mmol/L    CO2 (POC) 29.3 21 - 32 mmol/L    Anion gap (POC) 10 10 - 20 mmol/L    Glucose (POC) 82 65 - 100 mg/dL    Creatinine (POC) 9.20 0.6 - 1.3 mg/dL    GFRAA, POC >39 >39 fo/fpw/8.26f7    GFRNA, POC >60 >60 ml/min/1.76m2    Comment Comment Not Indicated.         Medications:  Medications Administered     0.9% sodium chloride infusion     Admin Date  12/26/2020 Action  New Bag Dose  25 mL/hr Rate  25 mL/hr Route  IntraVENous Administered By  Mora Mariel SQUIBB, RN          zoledronic acid (ZOMETA) 3.5 mg in 0.9% sodium chloride 100 mL IVPB     Admin Date  12/26/2020 Action  New Bag Dose  3.5 mg Route  IntraVENous Administered By  Mora Mariel SQUIBB, RN              Jillian Bean tolerated treatment well and [facility] at 1230pm.   PIV flushed & removed. She is to return on July 03 2021 at 1100am for her next appointment.    Mariel SQUIBB Mora, RN  Dec 26, 2020

## 2020-12-26 NOTE — Progress Notes (Signed)
Jillian Bean is a 80 y.o. female Follow up for the evaluation of breast cancer.     1. Have you been to the ER, urgent care clinic since your last visit?  Hospitalized since your last visit? No    2. Have you seen or consulted any other health care providers outside of the [facility] since your last visit?  Include any pap smears or colon screening. No

## 2020-12-26 NOTE — Progress Notes (Signed)
[facility]  [place], [place]   [address]  W: [phone]   F: [phone]      F/u HEME/ONC CONSULT    Reason for visit: evaluation for treatment for breast cancer    Consulting physician: Dr. Gilford Rile    HPI:   Jillian Bean is a 80 y.o.  female who I was asked to see in consultation at the request of Dr. Elie Confer for evaluation for systemic therapy for breast cancer.    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 Right lumpectomy on 11/10/14 shows IDC, 1.7 cm, gr 3, 0/5 LN, no LVI.  PT1cN0Mx.    [place] q 3 weeks x 6: 12/21/14- 04/12/15  C#5 held 1 week on 03/15/15 due to thrombocytopenia   [place]back Herceptin: 05/03/15-11/29/15    S/p XRT 06/03/15    Bone Marrow Bx 10/09/16: Variably normocellular marrow with trilineage hematopoiesis.    Interval History: In today for follow up. No issues today    DX   Encounter Diagnosis   Name Primary?   ??? History of breast cancer Yes     Past Medical History:   Diagnosis Date   ??? Anemia    ??? Back pain 09/01/2007   ??? Cancer (HCC)     R breast   ??? Chronic pain     BACK PAIN   ??? Coagulation disorder ([place])     IRON DEFFICIENCY ANEMIA   ??? Colonic polyps 08/31/1998   ??? 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 DVT (deep venous thrombosis) ([place]) 01/2017   ??? Hiatal hernia 07/11/2011    Dr.sobieski   ??? Hypercholesteremia 09/01/2007   ??? Hypothyroidism 09/01/2003   ??? 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 Nausea & vomiting    ??? OA (osteoarthritis) 09/01/2007   ??? Other ill-defined conditions(799.89)     VERTIGO   ??? Plantar fasciitis 09/01/2007   ??? S/P colonoscopy 10/10/2007   ??? Thromboembolus ([place]) 01/2017    RIGHT LEG   ??? Thyroid disease      Past Surgical History:   Procedure Laterality Date   ??? COLONOSCOPY N/A 11/12/2016     COLONOSCOPY performed by Burnett Harry, MD at [place]   ??? COLONOSCOPY,DIAGNOSTIC  11/12/2016        ??? ENDOSCOPY, COLON, DIAGNOSTIC  7/12/18/08    7/05 Dr Maudry Diego   ??? HX BREAST LUMPECTOMY Right 2016   ??? HX BREAST REDUCTION  1993   ??? HX CATARACT REMOVAL Right 01/21/2018   ??? HX CATARACT REMOVAL Left 03/11/2018   ??? HX COLONOSCOPY  2014    Dr Illene Silver   ??? HX GI      COLONOSCOPY   ??? HX HEENT  04/07/10    thyroid biopsy neg dr Leward Quan   ??? HX HEENT      wisdom teeth extraction   ??? HX HYSTERECTOMY  1975    PARTIAL   ??? HX KNEE REPLACEMENT Left 08/22/09   ??? HX KNEE REPLACEMENT Right 08/13/12   ??? HX ORTHOPAEDIC Right 11/26/2016    cleaned infection in joint   ??? HX ORTHOPAEDIC Right 03/2017    revision total knee replacement   ??? HX OTHER SURGICAL  2013  MINIMALLY INVASIVE LUMBAR DECOMPRESSION   ??? NEUROLOGICAL PROCEDURE UNLISTED  2013    LUMBAR DECOMPRESSION   ??? UPPER GI ENDOSCOPY,BIOPSY  11/12/2016          Social History     Socioeconomic History   ??? Marital status: MARRIED   Tobacco Use   ??? Smoking status: Never Smoker   ??? Smokeless tobacco: Never Used   Substance and Sexual Activity   ??? Alcohol use: No   ??? Drug use: No   ??? Sexual activity: Not Currently     Family History   Problem Relation Age of Onset   ??? Cancer Mother         Breast   ??? Hypertension Mother    ??? Heart Failure Father         MI   ??? Heart Attack Father    ??? Hypertension Father    ??? Mult Sclerosis Brother    ??? Anesth Problems Neg Hx        Current Outpatient Medications   Medication Sig Dispense Refill   ??? bempedoic acid-ezetimibe (Nexlizet) 180-10 mg tab Take 1 Tablet by mouth daily. 90 Each 3   ??? Synthroid 112 mcg tablet Take 1 Tablet by mouth Daily (before breakfast). 90 Tablet 3   ??? biotin 10,000 mcg cap Take  by mouth daily.     ??? acetaminophen (TYLENOL PO) Take 500 mg by mouth daily.     ??? mycophenolate (CELLCEPT) 500 mg tablet Take 1 Tablet by mouth two (2) times a day. 180 Tablet 2   ??? zoledronic acid (ZOMETA IV) by IntraVENous route every 6  months.     ??? multivitamin (ONE A DAY) tablet Take 1 Tab by mouth daily.     ??? cholecalciferol, vitamin D3, (VITAMIN D3 PO) Take 4,000 Units by mouth daily.     ??? LOW-DOSE ASPIRIN PO Take 162 mg by mouth daily. Taking 2 tab daily     ??? docusate sodium (COLACE) 100 mg capsule Take 100 mg by mouth daily.       Allergies   Allergen Reactions   ??? Atorvastatin Myalgia   ??? Zocor [Simvastatin] Other (comments) and Diarrhea     Other reaction(s): Adverse reaction to substance   Myalgias     ??? Celebrex [Celecoxib] Other (comments)     Aggitation   ??? Gabapentin Other (comments)     FELT UNSTEADY ON MY FEET   ??? Levaquin [Levofloxacin] Palpitations   ??? Nexium [Esomeprazole Magnesium] Diarrhea   ??? Other Medication Swelling     Latanoprost drops   ??? Oxycontin [Oxycodone] Itching   ??? Pravastatin Nausea and Vomiting   ??? Statins-Hmg-Coa Reductase Inhibitors Other (comments)     lymphacytic colitis     ??? Yellow Dye Hives       Review of Systems    A comprehensive review of systems was performed and all systems were negative except for HPI and for the symptom report form, reviewed and scanned in.    Objective:  Physical Exam:  Visit Vitals  BP (!) 129/51   Pulse 71   Temp 98.7 ??F (37.1 ??C) (Temporal)   Resp 18   Ht '5\' 5"'  (1.651 m)   Wt 144 lb (65.3 kg)   LMP 04/13/2010   SpO2 96%   BMI 23.96 kg/m??     General: No distress  Eyes: Anicteric sclerae  HENT: Atraumatic  Neck: Supple  Respiratory: Normal respiratory effort  CV: No peripheral edema  GI: nondistended  Skin: No rashes, ecchymoses, or petechiae  Psych: Alert, oriented, appropriate affect, normal judgment/insight      Diagnostic Imaging     12/16/14 TTE: EF 71%.  03/02/15 TTE: EF 68%  05/30/15 TTE :  EF 65%  07/21/15 TTE: EF 66%  10/06/15 TTE: EF 62%  05/23/16 TTE EF 60%    03/15/16 dexa  Findings:  ????  Fractures identified on Lateral scanogram:  None  ????  Femoral Neck:  Right  Bone mineral density (gm/cm2):? 0.649  % of peak bone mass: 63  % for age matched controls:? 86  T-score:  -2.8  Z-score: -0.8  ????  Total Hip: Right  Bone mineral density (gm/cm2):  0.761  % of peak bone mass:   76  % for age matched controls:  98  T-score:   -2.0  Z-score:  -0.1  ??  Lumbar Spine:  L1-L4  Bone mineral density (gm/cm2):  1.211  % of peak bone mass:  101   % for age matched controls:  124  T-score:  0.1  Z-score:  2.0  ??  The T score for the left distal third radius is -1.4.  ????  IMPRESSION  Impression:  ????  This patient is osteoporotic using the World Health Organization criteria  As compared to the prior study, there has been no change in the bone mineral  density of the right total hip and an increase in the bone mineral density of  the lumbar spine of 2.0%.  10 year probability of major osteoporotic fracture:  19.6%  10 year probability of hip fracture:  7.4%    09/20/16 CT c/a/p:  IMPRESSION:  No acute process in the chest, abdomen, and pelvis.  ??  05/16/20 dexa  Findings:     Fractures identified on Lateral scanogram: None     Femoral Neck Left:  Bone mineral density (gm/cm2): 0.703 g/cm?  % of peak bone mass: 68%  % for age matched controls: 95%  T-score: -2.4   Z-score: -0.3   ??  Femoral Neck Right:  Bone mineral density (gm/cm2): 0.637 g/cm?  % of peak bone mass: 61%  % for age matched controls:  86%  T-score: -2.9   Z-score: -0.7      Total Hip Left:  Bone mineral density (gm/cm2): 0.813 g/cm?  % of peak bone mass: 81%  % for age matched controls: 108%  T-score: -1.5   Z-score: 0.5   ??  Total Hip Right:  Bone mineral density (gm/cm2): 0.746 g/cm?  % of peak bone mass: 74%  % for age matched controls:  99%  T-score: -2.1   Z-score: -0.1   ??  Lumbar Spine: L1-L4  Bone mineral density (gm/cm2): 1.245 g/cm?  % of peak bone mass: 104%  % for age matched controls: 128%  T-score: 0.4   Z-score: 2.2   ??  33% Radius Left:  Bone mineral density (gm/cm2): 0.608 g/cm?  % of peak bone mass: 85%  % for age matched controls:  118%  T-score: -1.5   Z-score: 1.3   ??  IMPRESSION     This patient is osteoporotic using  the World Health Organization criteria  A direct comparison to the last exam cannot be made due to differences in  machinery.   10 year probability of major osteoporotic fracture: 23%  10 year probability of hip fracture: 9.4%    Lab Results  Lab Results   Component Value Date/Time    WBC 6.0 12/20/2020 11:28 AM  HGB 11.9 12/20/2020 11:28 AM    HCT 39.8 12/20/2020 11:28 AM    PLATELET 308 12/20/2020 11:28 AM    MCV 93.6 12/20/2020 11:28 AM     Lab Results   Component Value Date/Time    Sodium 139 12/20/2020 11:28 AM    Potassium 4.2 12/20/2020 11:28 AM    Chloride 106 12/20/2020 11:28 AM    CO2 28 12/20/2020 11:28 AM    Anion gap 5 12/20/2020 11:28 AM    Glucose 93 12/20/2020 11:28 AM    BUN 18 12/20/2020 11:28 AM    Creatinine 0.85 12/20/2020 11:28 AM    BUN/Creatinine ratio 21 (H) 12/20/2020 11:28 AM    GFR est AA >60 12/20/2020 11:28 AM    GFR est non-AA >60 12/20/2020 11:28 AM    Calcium 9.6 12/20/2020 11:28 AM    Alk. phosphatase 52 12/20/2020 11:28 AM    Protein, total 7.1 12/20/2020 11:28 AM    Albumin 4.4 12/20/2020 11:28 AM    Globulin 2.7 12/20/2020 11:28 AM    A-G Ratio 1.6 12/20/2020 11:28 AM    ALT (SGPT) 26 12/20/2020 11:28 AM       Lab Results   Component Value Date/Time    Reticulocyte count 1.5 09/05/2016 09:40 AM    Iron % saturation 18 07/22/2017 10:57 AM    TIBC 198 (L) 07/22/2017 10:57 AM    Ferritin 1,447 (H) 07/22/2017 10:57 AM    Vitamin B12 542 07/30/2016 09:31 AM    Folate 14.8 07/30/2016 09:31 AM    Haptoglobin 412 (H) 09/05/2016 09:40 AM    LD 192 09/05/2016 09:40 AM    Sed rate, automated 19 11/25/2019 08:53 AM    C-Reactive protein 21.70 (H) 11/28/2016 05:25 AM    TSH 0.41 12/20/2020 11:28 AM     Lab Results   Component Value Date/Time    INR 1.0 02/15/2017 03:11 PM    aPTT 28.7 08/18/2009 01:05 PM       Assessment/Plan:  80 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  PS 0    1. Right Breast cancer stage: IA    Hormonal  therapy: not indicated due to receptor (-) status    We explained to the patient that the goal of systemic adjuvant therapy is to improve the chances for cure and decrease the risk of relapse. We explained why a patient can have microscopic cancer spread now even though physical examination, laboratory studies and imaging studies are negative for cancer. We explained that the same treatments used now as adjuvant or preventive treatments rarely if ever are curative in women who develop metastases.     No evidence of recurrence    Mammogram in 05/04/2020 at [facility], reviewed records in media    She declined to take neratinib.    I will graduate her after her next zometa in Nov    2. Emotional well being: She has excellent support and is coping well with her disease.    3. FH of breast cancer: BRCA 1/2 [place] testing negative    4. Osteoporosis: On dexa from 2015; was on fosamax from Dr. Wanda Plump, started 08/2016, but stopped prior to surgery in 11/2016. DEXA 03/15/16 showed some improvement. Dr. Wanda Plump has recommended Reclast    We discussed the data from [place], [place] 2013, for the meta-analysis for adjuvant bisphosphonate use in breast cancer.  We discussed that in postmenopausal women, it  appears that the bisphosphate zolendronic acid, given as in ABCSG 12 at 4 mg IV q 6 months x 3 years, is beneficial in improving bone DFS and OS.  We discussed side effects such as ONJ and the need to avoid invasive dental procedures while on these medications, renal damage, and hypocalcemia.    After this discussion, she is agreeable to zometa as above.  #1, 12/29/2018, #2 06/29/19, #3 12/28/19, #4 06/28/20; #5 12/26/20.    Repeat DEXA due 05/2020, improved    5. R leg DVT: provoked, in non weight bearing leg s/p surgery. Reports she stopped Eliquis 08/2017.     6. Myasthenia Gravis:  On Mestinon and prednisone. Seeing neurology      Sonda Rumble, MD

## 2020-12-27 ENCOUNTER — Ambulatory Visit
Admit: 2020-12-27 | Discharge: 2020-12-27 | Payer: MEDICARE | Attending: Physician Assistant | Primary: Internal Medicine

## 2020-12-27 ENCOUNTER — Encounter: Admit: 2020-12-27 | Payer: MEDICARE | Primary: Internal Medicine

## 2020-12-27 ENCOUNTER — Encounter

## 2020-12-27 ENCOUNTER — Ambulatory Visit: Attending: Physician Assistant | Primary: Internal Medicine

## 2020-12-27 DIAGNOSIS — Z96653 Presence of artificial knee joint, bilateral: Secondary | ICD-10-CM

## 2020-12-27 NOTE — Progress Notes (Signed)
Jillian Bean (DOB: [birth year]) is a 80 y.o. female, established patient, here for evaluation of the following chief complaint(s):  Surgical Follow-up         SUBJECTIVE/OBJECTIVE:    Jillian Bean (DOB: 1941/08/10) is a 80 y.o. female.    Right Total Knee Arthroplasty Revision - Right 03/18/2017     The patient is 3 years status post right total knee revision performed by Dr. Elvin So for joint infection.  The patient is 11 years status post left total knee replacement performed in 2011 with Dr. Danise Edge at [facility].  The patient states that both knees are going well at this time.  The patient is not taking any pain medication for the knees on a routine basis.  Patient states she is very pleased with the results of both of her knee surgeries and the progress which she has made to date.    Allergies   Allergen Reactions   ??? Atorvastatin Myalgia   ??? Zocor [Simvastatin] Other (comments) and Diarrhea     Other reaction(s): Adverse reaction to substance   Myalgias     ??? Celebrex [Celecoxib] Other (comments)     Aggitation   ??? Gabapentin Other (comments)     FELT UNSTEADY ON MY FEET   ??? Levaquin [Levofloxacin] Palpitations   ??? Nexium [Esomeprazole Magnesium] Diarrhea   ??? Other Medication Swelling     Latanoprost drops   ??? Oxycontin [Oxycodone] Itching   ??? Pravastatin Nausea and Vomiting   ??? Statins-Hmg-Coa Reductase Inhibitors Other (comments)     lymphacytic colitis     ??? Yellow Dye Hives       Current Outpatient Medications   Medication Sig   ??? bempedoic acid-ezetimibe (Nexlizet) 180-10 mg tab Take 1 Tablet by mouth daily.   ??? Synthroid 112 mcg tablet Take 1 Tablet by mouth Daily (before breakfast).   ??? biotin 10,000 mcg cap Take  by mouth daily.   ??? acetaminophen (TYLENOL PO) Take 500 mg by mouth daily.   ??? mycophenolate (CELLCEPT) 500 mg tablet Take 1 Tablet by mouth two (2) times a day.   ??? zoledronic acid (ZOMETA IV) by IntraVENous route every 6 months.   ??? multivitamin (ONE A DAY) tablet Take 1 Tab by mouth  daily.   ??? cholecalciferol, vitamin D3, (VITAMIN D3 PO) Take 4,000 Units by mouth daily.   ??? LOW-DOSE ASPIRIN PO Take 162 mg by mouth daily. Taking 2 tab daily   ??? docusate sodium (COLACE) 100 mg capsule Take 100 mg by mouth daily.     No current facility-administered medications for this visit.       Social History     Socioeconomic History   ??? Marital status: MARRIED     Spouse name: Not on file   ??? Number of children: Not on file   ??? Years of education: Not on file   ??? Highest education level: Not on file   Occupational History   ??? Not on file   Tobacco Use   ??? Smoking status: Never Smoker   ??? Smokeless tobacco: Never Used   Substance and Sexual Activity   ??? Alcohol use: No   ??? Drug use: No   ??? Sexual activity: Not Currently   Other Topics Concern   ??? Not on file   Social History Narrative   ??? Not on file     Social Determinants of Health     Financial Resource Strain:    ??? Difficulty of Paying  Living Expenses: Not on file   Food Insecurity:    ??? Worried About [place] in the Last Year: Not on file   ??? Ran Out of Food in the Last Year: Not on file   Transportation Needs:    ??? Lack of Transportation (Medical): Not on file   ??? Lack of Transportation (Non-Medical): Not on file   Physical Activity:    ??? Days of Exercise per Week: Not on file   ??? Minutes of Exercise per Session: Not on file   Stress:    ??? Feeling of Stress : Not on file   Social Connections:    ??? Frequency of Communication with Friends and Family: Not on file   ??? Frequency of Social Gatherings with Friends and Family: Not on file   ??? Attends Religious Services: Not on file   ??? Active Member of Clubs or Organizations: Not on file   ??? Attends Archivist Meetings: Not on file   ??? Marital Status: Not on file   Intimate Partner Violence:    ??? Fear of Current or Ex-Partner: Not on file   ??? Emotionally Abused: Not on file   ??? Physically Abused: Not on file   ??? Sexually Abused: Not on file   Housing Stability:    ??? Unable to Pay for  Housing in the Last Year: Not on file   ??? Number of Places Lived in the Last Year: Not on file   ??? Unstable Housing in the Last Year: Not on file       Past Surgical History:   Procedure Laterality Date   ??? COLONOSCOPY N/A 11/12/2016    COLONOSCOPY performed by Burnett Harry, MD at [place]   ??? COLONOSCOPY,DIAGNOSTIC  11/12/2016        ??? ENDOSCOPY, COLON, DIAGNOSTIC  7/12/18/08    7/05 Dr Maudry Diego   ??? HX BREAST LUMPECTOMY Right 2016   ??? HX BREAST REDUCTION  1993   ??? HX CATARACT REMOVAL Right 01/21/2018   ??? HX CATARACT REMOVAL Left 03/11/2018   ??? HX COLONOSCOPY  2014    Dr Illene Silver   ??? HX GI      COLONOSCOPY   ??? HX HEENT  04/07/10    thyroid biopsy neg dr Leward Quan   ??? HX HEENT      wisdom teeth extraction   ??? HX HYSTERECTOMY  1975    PARTIAL   ??? HX KNEE REPLACEMENT Left 08/22/09   ??? HX KNEE REPLACEMENT Right 08/13/12   ??? HX ORTHOPAEDIC Right 11/26/2016    cleaned infection in joint   ??? HX ORTHOPAEDIC Right 03/2017    revision total knee replacement   ??? HX OTHER SURGICAL  2013    MINIMALLY INVASIVE LUMBAR DECOMPRESSION   ??? NEUROLOGICAL PROCEDURE UNLISTED  2013    LUMBAR DECOMPRESSION   ??? UPPER GI ENDOSCOPY,BIOPSY  11/12/2016            Family History   Problem Relation Age of Onset   ??? Cancer Mother         Breast   ??? Hypertension Mother    ??? Heart Failure Father         MI   ??? Heart Attack Father    ??? Hypertension Father    ??? Mult Sclerosis Brother    ??? Anesth Problems Neg Hx         OB History    No obstetric history on file.  REVIEW OF SYSTEMS:    Patient denies any recent fever, chills, nausea, vomiting, chest pain, abdominal pain, blurred vision, dizziness or shortness of breath.      Vitals:    Ht 5\' 5"  (1.651 m)    Wt 144 lb (65.3 kg)    LMP 04/13/2010    BMI 23.96 kg/m??    Body mass index is 23.96 kg/m??.       PHYSICAL EXAM:    The patient is alert and oriented x 3 and in no acute distress.patient ambulates with a normal gait without gait aids.  Range of motion of the right knee demonstrates full  extension with flexion to 110 degrees and range of motion of the left knee demonstrates full extension flexion to 120 degrees.  No instability findings to anterior posterior or varus valgus stress testing in either knee.  There is no swelling or erythema in either knee.  There is no calf tenderness to palpation. Distal motor and sensation is intact.      IMAGING:    Results from Appointment encounter on 12/27/20    XR KNEES BI MIN 4 V    Narrative  AP standing, lateral and merchant's view digital radiograph of the right knee obtained in the office today were reviewed and demonstrate anatomic alignment of the long stemmed components and cerclage wires with no evidence of hardware loosening or subsidence.  3 view radiographs of the left knee demonstrate anatomic alignment of components with slight radiolucency under the medial and lateral tibial component???bone interface.  No evidence of gross loosening or subsidence.         Orders Placed This Encounter   ??? XR KNEE LT 3 V     Standing Status:   Future     Number of Occurrences:   1     Standing Expiration Date:   12/28/2021     Order Specific Question:   Reason for Exam     Answer:   ROOM 2B          ASSESSMENT/PLAN:      1. Status post total bilateral knee replacement  -     XR KNEE LT 3 V; Future        Below is the assessment and plan developed based on review of pertinent history, physical exam, labs, studies, and medications.    Discussed radiographic findings and answered all patient questions to her satisfaction.  Patient was encouraged to remain active and to be participate in low impact activities as tolerated. Will plan on follow up in 2 years time for 5 year annual follow up or on an as-needed basis. Patient was asked to contact the office with any questions or concerns. The patient understands and agrees to the treatment plan as outlined above.     Return in about 2 years (around 12/28/2022) for Annual follow-up.       Dr. Elvin So was available for  immediate consultation as needed.     An electronic signature was used to authenticate this note.  -- Cristal Deer, PA-C

## 2020-12-31 ENCOUNTER — Encounter

## 2021-01-17 ENCOUNTER — Encounter: Attending: Physician Assistant | Primary: Internal Medicine

## 2021-01-24 ENCOUNTER — Encounter: Attending: Neurology | Primary: Internal Medicine

## 2021-02-20 NOTE — Telephone Encounter (Signed)
Pharmacy needs to know if a generic brand can be used for the patients SYNTHROID MEDICATION  Please call [phone]  Ref # [id number]

## 2021-02-20 NOTE — Telephone Encounter (Signed)
Per [facility] patient requested to have the DAW1 title removed from her synthroid Rx. Per patient her copay would sometimes be $10 & sometimes it would be $80. The patient would prefer to pay the lower copay. Pharmacy advised okay to remove the DAW1 title.

## 2021-02-28 ENCOUNTER — Other Ambulatory Visit: Payer: Self-pay | Admitting: Internal Medicine

## 2021-02-28 DIAGNOSIS — Z1231 Encounter for screening mammogram for malignant neoplasm of breast: Secondary | ICD-10-CM

## 2021-03-03 ENCOUNTER — Encounter: Attending: Neurology | Primary: Internal Medicine

## 2021-03-29 ENCOUNTER — Encounter: Attending: Physician Assistant | Primary: Internal Medicine

## 2021-03-30 ENCOUNTER — Encounter

## 2021-03-30 MED ORDER — MYCOPHENOLATE 500 MG TAB
500 mg | ORAL_TABLET | ORAL | 2 refills | Status: AC
Start: 2021-03-30 — End: ?

## 2021-05-05 ENCOUNTER — Other Ambulatory Visit: Payer: Self-pay

## 2021-05-05 ENCOUNTER — Ambulatory Visit
Admission: RE | Admit: 2021-05-05 | Discharge: 2021-05-05 | Disposition: A | Discharge status: Home / Self Care | Payer: Medicare Other | Source: Ambulatory Visit | Attending: Internal Medicine | Admitting: Internal Medicine

## 2021-05-05 DIAGNOSIS — Z1231 Encounter for screening mammogram for malignant neoplasm of breast: Secondary | ICD-10-CM | POA: Diagnosis not present

## 2021-05-05 HISTORY — DX: Personal history of antineoplastic chemotherapy: Z92.21

## 2021-05-05 HISTORY — DX: Personal history of irradiation: Z92.3

## 2021-05-05 IMAGING — MG MM DIGITAL SCREENING BILAT W/ TOMO AND CAD
6 of 10 series · 6 of 30 positions shown · non-contrast
Comparison: Previous exam(s).

CLINICAL DATA: Screening.

EXAM:
DIGITAL SCREENING BILATERAL MAMMOGRAM WITH TOMOSYNTHESIS AND CAD
TECHNIQUE: Bilateral screening digital craniocaudal and mediolateral oblique
mammograms were obtained. Bilateral screening digital breast
tomosynthesis was performed. The images were evaluated with
computer-aided detection.

[R MLO synth-2D (1 of 2)]
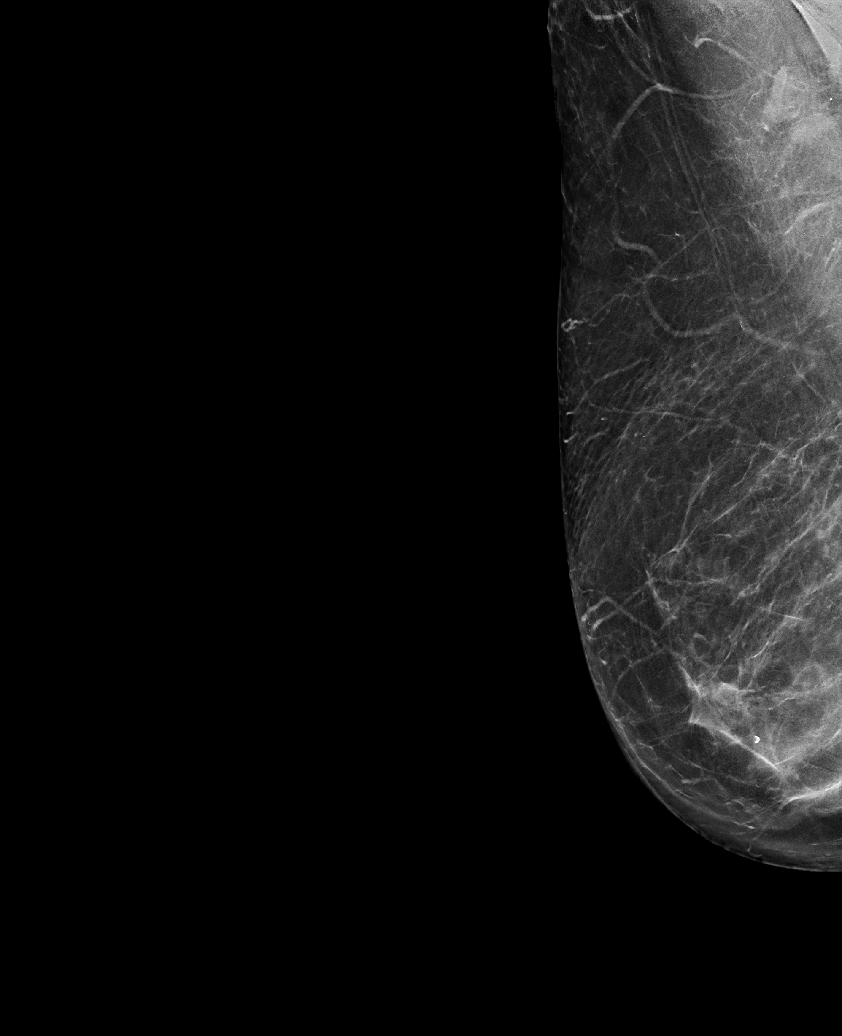

[R MLO synth-2D (2 of 2)]
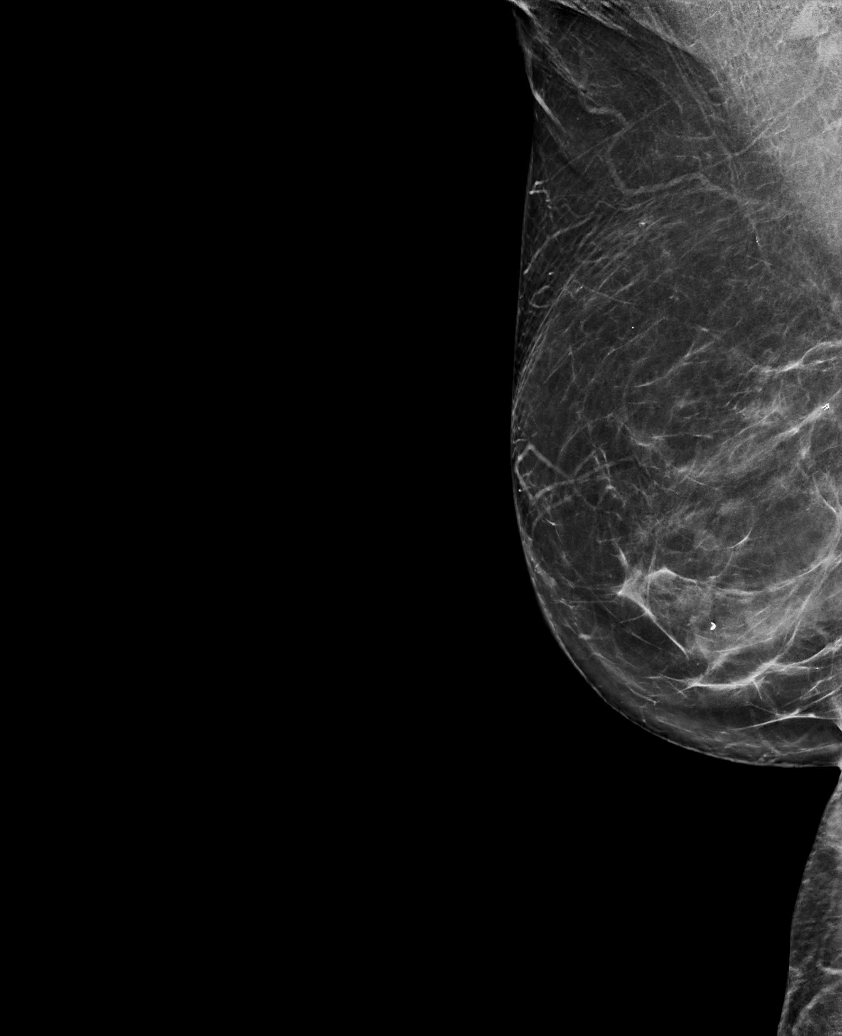

[R CC synth-2D]
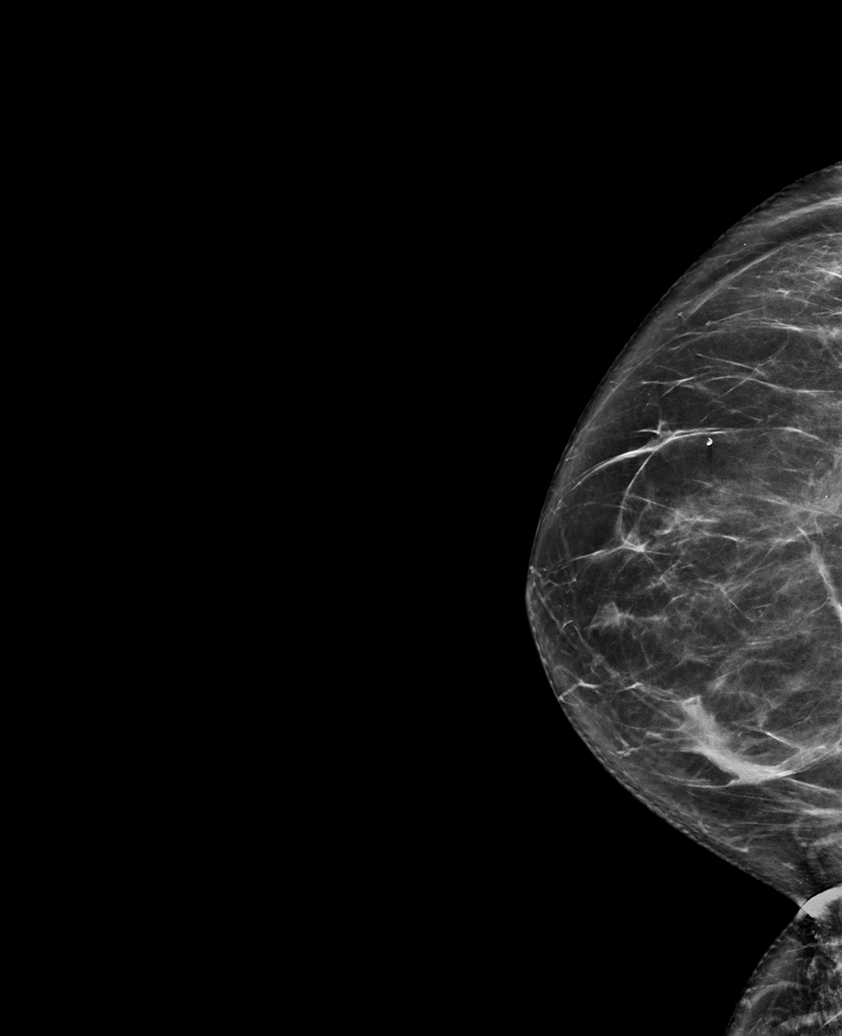

[L CC synth-2D]
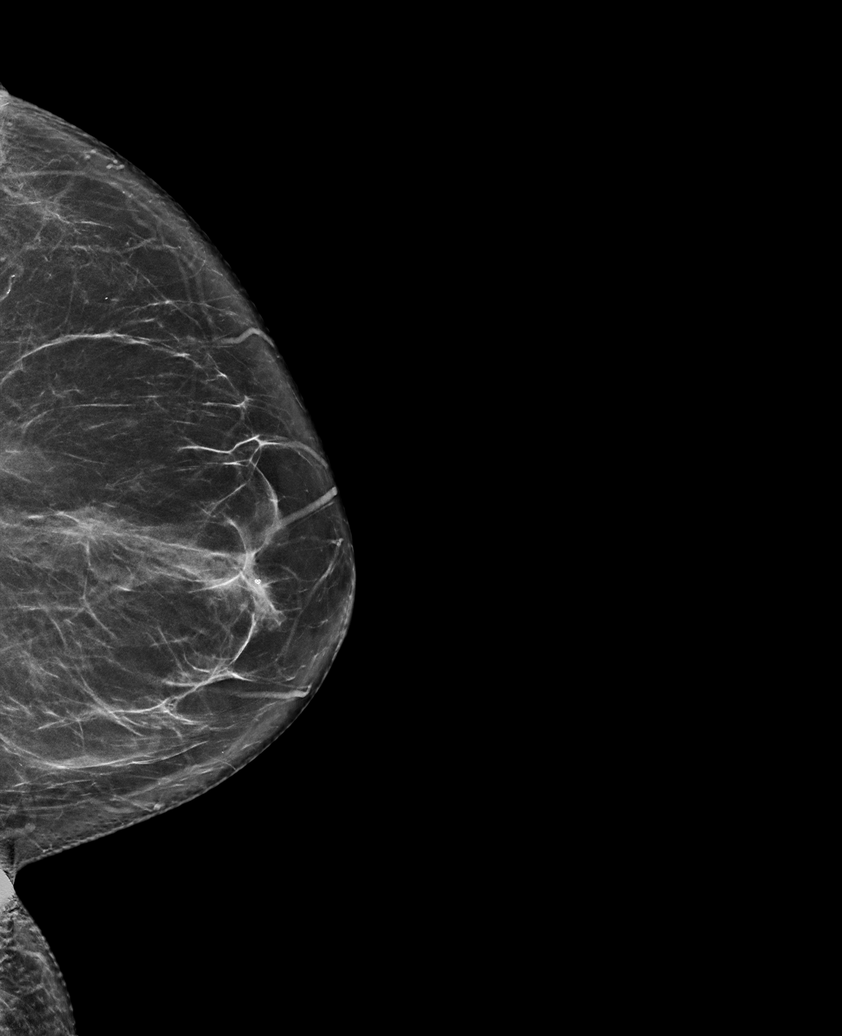

[L MLO synth-2D]
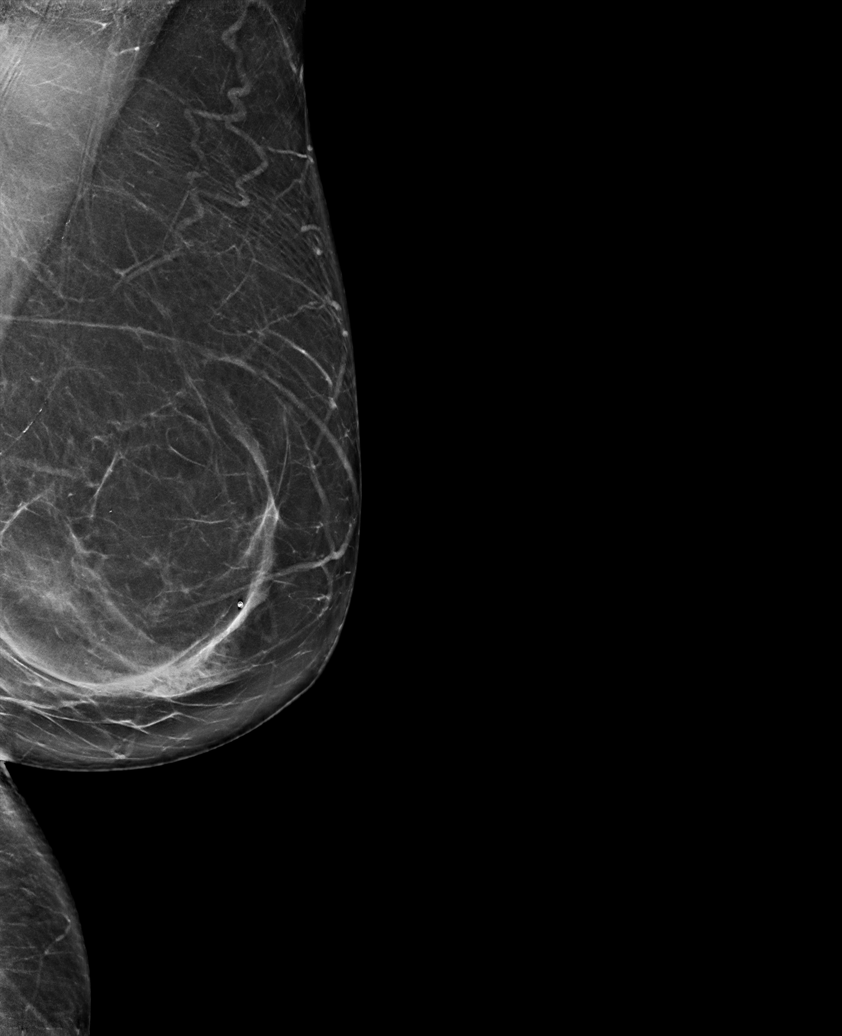

[L MLO tomo · tomo slice 39/78.0]
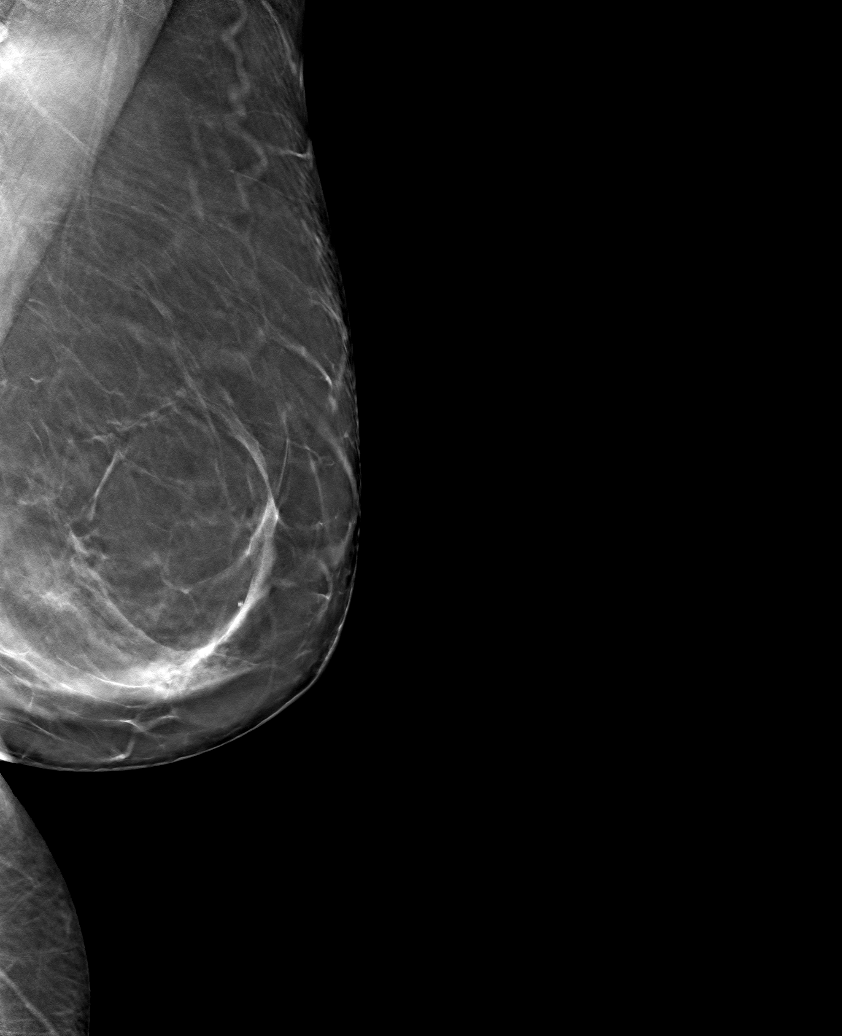

[6 of 30 positions shown; findings below may reference images not displayed]

ACR Breast Density Category b: There are scattered areas of
fibroglandular density.
FINDINGS: There are no findings suspicious for malignancy.
IMPRESSION: No mammographic evidence of malignancy. A result letter of this
screening mammogram will be mailed directly to the patient.

RECOMMENDATION:
Screening mammogram in one year. (Code:[BY])

BI-RADS CATEGORY  1: Negative.

## 2021-07-03 ENCOUNTER — Inpatient Hospital Stay: Admit: 2021-07-03 | Payer: MEDICARE | Primary: Internal Medicine

## 2021-07-03 ENCOUNTER — Ambulatory Visit: Admit: 2021-07-03 | Discharge: 2021-07-03 | Payer: MEDICARE | Attending: Nurse Practitioner | Primary: Internal Medicine

## 2021-07-03 ENCOUNTER — Encounter: Payer: MEDICARE | Primary: Internal Medicine

## 2021-07-03 DIAGNOSIS — Z853 Personal history of malignant neoplasm of breast: Secondary | ICD-10-CM

## 2021-07-03 LAB — POC CHEM8
ANION GAP ISTAT,IAGAP: 9 mmol/L — ABNORMAL LOW (ref 10–20)
Anion gap (POC): 9 mmol/L — ABNORMAL LOW (ref 10–20)
CHLORIDE ISTAT,ICL: 108 mmol/L — ABNORMAL HIGH (ref 98–107)
CO2 (POC): 28.5 mmol/L (ref 21–32)
Calcium, ionized (POC): 1.19 mmol/L (ref 1.12–1.32)
Calcium, ionized, POC: 1.19 mmol/L (ref 1.12–1.32)
Chloride (POC): 108 mmol/L — ABNORMAL HIGH (ref 98–107)
Creatinine (POC): 0.7 mg/dL (ref 0.6–1.3)
GLUCOSE ISTAT,IGLU: 92 mg/dL (ref 65–100)
Glucose (POC): 92 mg/dL (ref 65–100)
POC Creatinine: 0.7 mg/dL (ref 0.6–1.3)
POTASSIUM ISTAT,IK: 3.8 mmol/L (ref 3.5–5.1)
Potassium (POC): 3.8 mmol/L (ref 3.5–5.1)
SODIUM ISTAT,INA: 144 mmol/L (ref 136–145)
Sodium (POC): 144 mmol/L (ref 136–145)
TCO2 ISTAT,ITCO2: 28.5 mmol/L (ref 21–32)
eGFR (POC): >60 mL/min/{1.73_m2} (ref >60)
eGFR, POC: >60 mL/min/{1.73_m2} (ref >60)

## 2021-07-03 MED ORDER — ZOLEDRONIC ACID 4MG/100ML IN MANNITOL & WATER PREMIX
4 mg/100 mL | Freq: Once | INTRAVENOUS | Status: AC
Start: 2021-07-03 — End: 2021-07-03
  Administered 2021-07-03: 18:00:00 via INTRAVENOUS

## 2021-07-03 MED ORDER — SODIUM CHLORIDE 0.9 % IV
INTRAVENOUS | Status: DC
Start: 2021-07-03 — End: 2021-07-04
  Administered 2021-07-03: 18:00:00 via INTRAVENOUS

## 2021-07-03 MED FILL — ZOLEDRONIC ACID 4 MG/100 ML IN MANNITOL & WATER IVPB: 4 mg/100 mL | INTRAVENOUS | Qty: 100

## 2021-07-03 MED FILL — SODIUM CHLORIDE 0.9 % IV: INTRAVENOUS | Qty: 1000

## 2021-07-03 NOTE — Progress Notes (Signed)
[facility]  [address] [place], [place]   [address]  W: [phone]   F: [phone]        Reason for visit: evaluation for treatment for breast cancer    Consulting physician: Dr. Gilford Rile    HPI:   Jillian Bean is a 80 y.o.  female who I was asked to see in consultation at the request of Dr. Elie Confer for evaluation for systemic therapy for breast cancer.    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 Right lumpectomy on 11/10/14 shows IDC, 1.7 cm, gr 3, 0/5 LN, no LVI.  PT1cN0Mx.    [place] q 3 weeks x 6: 12/21/14- 04/12/15  C#5 held 1 week on 03/15/15 due to thrombocytopenia   [place]back Herceptin: 05/03/15-11/29/15    S/p XRT 06/03/15    Bone Marrow Bx 10/09/16: Variably normocellular marrow with trilineage hematopoiesis.    Interval History: In today for follow up. Reports gr 1 constipation, gr 1 fatigue, gr 1 hair loss. She states she is doing well today and ready for Thanksgiving.     DX   Encounter Diagnosis   Name Primary?    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    DVT (deep venous thrombosis) ([place]) 01/2017    Hiatal hernia 07/11/2011    Dr.sobieski    Hypercholesteremia 09/01/2007    Hypothyroidism 09/01/2003    Lymphocytic colitis 03/27/2012    colonoscopy 6/13 dr Maudry Diego    Nausea & vomiting     OA (osteoarthritis) 09/01/2007    Other ill-defined conditions(799.89)     VERTIGO    Plantar fasciitis 09/01/2007    S/P colonoscopy 10/10/2007    Thromboembolus ([place]) 01/2017    RIGHT LEG    Thyroid disease      Past Surgical History:    Procedure Laterality Date    COLONOSCOPY N/A 11/12/2016    COLONOSCOPY performed by Burnett Harry, MD at [facility]  11/12/2016         ENDOSCOPY, COLON, DIAGNOSTIC  7/12/18/08    7/05 Dr Maudry Diego    HX BREAST LUMPECTOMY Right 2016    HX BREAST REDUCTION  1993    HX CATARACT REMOVAL Right 01/21/2018    HX CATARACT REMOVAL Left 03/11/2018    HX COLONOSCOPY  2014    Dr Illene Silver    HX GI      COLONOSCOPY    HX HEENT  04/07/10    thyroid biopsy neg dr Leward Quan    HX HEENT      wisdom teeth extraction    HX HYSTERECTOMY  1975    PARTIAL    HX KNEE REPLACEMENT Left 08/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    HX KNEE REPLACEMENT Right 08/13/12    HX ORTHOPAEDIC Right 11/26/2016    cleaned infection in joint    HX ORTHOPAEDIC Right  03/2017    revision total knee replacement    HX OTHER SURGICAL  2013    MINIMALLY INVASIVE LUMBAR DECOMPRESSION    NEUROLOGICAL PROCEDURE UNLISTED  2013    LUMBAR DECOMPRESSION    UPPER GI ENDOSCOPY,BIOPSY  11/12/2016          Social History     Socioeconomic History    Marital status: MARRIED   Tobacco Use    Smoking status: Never    Smokeless tobacco: Never   Substance and Sexual Activity    Alcohol use: No    Drug use: No    Sexual activity: Not Currently     Family History   Problem Relation Age of Onset    Cancer Mother         Breast    Hypertension Mother     Heart Failure Father         MI    Heart Attack Father     Hypertension Father     Mult Sclerosis Brother     Anesth Problems Neg Hx        Current Outpatient Medications   Medication Sig Dispense Refill    mycophenolate (CELLCEPT) 500 mg tablet TAKE 1 TABLET TWICE A DAY 180 Tablet 2    levothyroxine (SYNTHROID) 112 mcg tablet Take 112 mcg by mouth Daily (before breakfast).      bempedoic acid-ezetimibe (Nexlizet) 180-10 mg tab Take 1 Tablet by mouth daily. 90 Each 3    biotin 10,000 mcg cap Take  by mouth daily.      acetaminophen (TYLENOL PO) Take 500 mg by mouth daily.      zoledronic acid (ZOMETA IV) by IntraVENous route every 6 months.       multivitamin (ONE A DAY) tablet Take 1 Tab by mouth daily.      cholecalciferol, vitamin D3, (VITAMIN D3 PO) Take 4,000 Units by mouth daily.      LOW-DOSE ASPIRIN PO Take 162 mg by mouth daily. Taking 2 tab daily      docusate sodium (COLACE) 100 mg capsule Take 100 mg by mouth daily.       Facility-Administered Medications Ordered in Other Visits   Medication Dose Route Frequency Provider Last Rate Last Admin    0.9% sodium chloride infusion  25 mL/hr IntraVENous CONTINUOUS Jos Cygan A, NP        zoledronic acid (ZOMETA) 4 mg/159m infusion  4 mg IntraVENous ONCE Betsabe Iglesia A, NP         Allergies   Allergen Reactions    Atorvastatin Myalgia    Zocor [Simvastatin] Other (comments) and Diarrhea     Other reaction(s): Adverse reaction to substance   Myalgias      Celebrex [Celecoxib] Other (comments)     Aggitation    Gabapentin Other (comments)     FELT UNSTEADY ON MY FEET    Levaquin [Levofloxacin] Palpitations    Nexium [Esomeprazole Magnesium] Diarrhea    Other Medication Swelling     Latanoprost drops    Oxycontin [Oxycodone] Itching    Pravastatin Nausea and Vomiting    Statins-Hmg-Coa Reductase Inhibitors Other (comments)     lymphacytic colitis      Yellow Dye Hives       Review of Systems    A comprehensive review of systems was performed and all systems were negative except for HPI and for the symptom report form, reviewed and scanned in.    Objective:  Physical Exam:  Visit Vitals  BP (!) 149/65   Pulse 67   Temp 97.8 ??F (36.6 ??C) (Temporal)   Resp 18   Ht '5\' 5"'  (1.651 m)   Wt 143 lb (64.9 kg)   LMP 04/13/2010   SpO2 100%   BMI 23.80 kg/m??     General: No distress  Eyes: Anicteric sclerae  HENT: Atraumatic  Neck: Supple  Respiratory: Normal respiratory effort  CV: No peripheral edema  GI: nondistended  Skin: No rashes, ecchymoses, or petechiae  Psych: Alert, oriented, appropriate affect, normal judgment/insight      Diagnostic Imaging     12/16/14 TTE: EF 71%.  03/02/15 TTE: EF 68%  05/30/15 TTE  :  EF 65%  07/21/15 TTE: EF 66%  10/06/15 TTE: EF 62%  05/23/16 TTE EF 60%    03/15/16 dexa  IMPRESSION  Impression:      This patient is osteoporotic using the World Health Organization criteria  As compared to the prior study, there has been no change in the bone mineral  density of the right total hip and an increase in the bone mineral density of  the lumbar spine of 2.0%.  10 year probability of major osteoporotic fracture:  19.6%  10 year probability of hip fracture:  7.4%    09/20/16 CT c/a/p:  IMPRESSION:  No acute process in the chest, abdomen, and pelvis.     05/16/20 dexa     IMPRESSION     This patient is osteoporotic using the World Health Organization criteria  A direct comparison to the last exam cannot be made due to differences in  machinery.   10 year probability of major osteoporotic fracture: 23%  10 year probability of hip fracture: 9.4%    Lab Results  Lab Results   Component Value Date/Time    WBC 6.0 12/20/2020 11:28 AM    HGB 11.9 12/20/2020 11:28 AM    HCT 39.8 12/20/2020 11:28 AM    PLATELET 308 12/20/2020 11:28 AM    MCV 93.6 12/20/2020 11:28 AM     Lab Results   Component Value Date/Time    Sodium 139 12/20/2020 11:28 AM    Potassium 4.2 12/20/2020 11:28 AM    Chloride 106 12/20/2020 11:28 AM    CO2 28 12/20/2020 11:28 AM    Anion gap 5 12/20/2020 11:28 AM    Glucose 93 12/20/2020 11:28 AM    BUN 18 12/20/2020 11:28 AM    Creatinine 0.85 12/20/2020 11:28 AM    BUN/Creatinine ratio 21 (H) 12/20/2020 11:28 AM    GFR est AA >60 12/20/2020 11:28 AM    GFR est non-AA >60 12/20/2020 11:28 AM    Calcium 9.6 12/20/2020 11:28 AM    Alk. phosphatase 52 12/20/2020 11:28 AM    Protein, total 7.1 12/20/2020 11:28 AM    Albumin 4.4 12/20/2020 11:28 AM    Globulin 2.7 12/20/2020 11:28 AM    A-G Ratio 1.6 12/20/2020 11:28 AM    ALT (SGPT) 26 12/20/2020 11:28 AM       Lab Results   Component Value Date/Time    Reticulocyte count 1.5 09/05/2016 09:40 AM    Iron % saturation 18 07/22/2017 10:57 AM    TIBC 198 (L)  07/22/2017 10:57 AM    Ferritin 1,447 (H) 07/22/2017 10:57 AM    Vitamin B12 542 07/30/2016 09:31 AM    Folate 14.8 07/30/2016 09:31 AM    Haptoglobin 412 (H) 09/05/2016 09:40 AM    LD 192 09/05/2016 09:40 AM  Sed rate, automated 19 11/25/2019 08:53 AM    C-Reactive protein 21.70 (H) 11/28/2016 05:25 AM    TSH 0.41 12/20/2020 11:28 AM     Lab Results   Component Value Date/Time    INR 1.0 02/15/2017 03:11 PM    aPTT 28.7 08/18/2009 01:05 PM       Assessment/Plan:  80 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  PS 0    1. Right Breast cancer stage: IA    Hormonal therapy: not indicated due to receptor (-) status    We explained to the patient that the goal of systemic adjuvant therapy is to improve the chances for cure and decrease the risk of relapse. We explained why a patient can have microscopic cancer spread now even though physical examination, laboratory studies and imaging studies are negative for cancer. We explained that the same treatments used now as adjuvant or preventive treatments rarely if ever are curative in women who develop metastases.     No evidence of recurrence    Mammogram 05/05/2021 at [facility] - negative. Reviewed in [place].    Will see her back as needed. Discussed need for continued annual mammogram and breast exam.       2. Emotional well being: She has excellent support and is coping well with her disease.    3. FH of breast cancer: BRCA 1/2 [place] testing negative    4. Osteoporosis: On dexa from 2015; was on fosamax from Dr. Wanda Plump, started 08/2016, but stopped prior to surgery in 11/2016. DEXA 03/15/16 showed some improvement. Dr. Wanda Plump has recommended Reclast    We discussed the data from [place], [place] 2013, for the meta-analysis for adjuvant bisphosphonate use in breast cancer.  We discussed that in postmenopausal women, it appears that the bisphosphate zolendronic acid, given as in ABCSG 12 at 4 mg IV q 6 months  x 3 years, is beneficial in improving bone DFS and OS.  We discussed side effects such as ONJ and the need to avoid invasive dental procedures while on these medications, renal damage, and hypocalcemia.    After this discussion, she is agreeable to zometa as above.  #1, 12/29/2018, #2 06/29/19, #3 12/28/19, #4 06/28/20; #5 12/26/20, #6 due today 07/03/21.    Repeat DEXA 05/2020 showed improvement.     5. R leg DVT: provoked, in non weight bearing leg s/p surgery. Reports she stopped Eliquis 08/2017.     6. Myasthenia Gravis:  On Mestinon and prednisone. Seeing neurology.      Sonda Rumble, MD

## 2021-07-03 NOTE — Progress Notes (Signed)
 [facility] Short Visit Progress Note    Ms. Olarte admitted to [facility] for Zometa ambulatory in stable condition. Assessment completed. No new concerns voiced. LAC 24G PIV placed with +bld return, lab drawn and sent to processing. Pt proceeded to MD appointment.    Vital Signs:  Visit Vitals  BP (!) 152/46   Pulse 65   Temp 97.9 F (36.6 C)   Resp 18   Ht 5' 5 (1.651 m)   Wt 65.2 kg (143 lb 12.8 oz)   LMP 04/13/2010   SpO2 100%   Breastfeeding No   BMI 23.93 kg/m       Patient Vitals for the past 12 hrs:   Temp Pulse Resp BP SpO2   07/03/21 1302 97.9 F (36.6 C) 65 -- (!) 152/46 100 %   07/03/21 1151 97.8 F (36.6 C) 67 18 (!) 149/65 100 %         Lab Results:  Recent Results (from the past 12 hour(s))   POC CHEM8    Collection Time: 07/03/21 12:07 PM   Result Value Ref Range    Calcium, ionized (POC) 1.19 1.12 - 1.32 mmol/L    Sodium (POC) 144 136 - 145 mmol/L    Potassium (POC) 3.8 3.5 - 5.1 mmol/L    Chloride (POC) 108 (H) 98 - 107 mmol/L    CO2 (POC) 28.5 21 - 32 mmol/L    Anion gap (POC) 9 (L) 10 - 20 mmol/L    Glucose (POC) 92 65 - 100 mg/dL    Creatinine (POC) 9.29 0.6 - 1.3 mg/dL    eGFR (POC) >39 >39 fo/fpw/8.26f7    Comment Comment Not Indicated.             Medications:  Medications Administered       0.9% sodium chloride infusion       Admin Date  07/03/2021 Action  New Bag Dose  25 mL/hr Rate  25 mL/hr Route  IntraVENous Administered By  Judeth Farr, RN              zoledronic acid (ZOMETA) 4 mg/121mL infusion       Admin Date  07/03/2021 Action  New Bag Dose  4 mg Rate  400 mL/hr Route  IntraVENous Administered By  Judeth Farr, RN                      AVS declined.    Patient tolerated treatment well. PIV flushed and removed per protocol. Patient discharged from [facility] ambulatory in no distress at 1310. Patient has no further future appointment.    Farr Judeth, RN  July 03, 2021    No future appointments.

## 2021-07-03 NOTE — Progress Notes (Signed)
Jillian Bean is a 80 y.o. female Follow up for the evaluation of breast cancer.       1. Have you been to the ER, urgent care clinic since your last visit?  Hospitalized since your last visit? No    2. Have you seen or consulted any other health care providers outside of the [facility] since your last visit?  Include any pap smears or colon screening. No

## 2021-07-11 ENCOUNTER — Encounter: Attending: Neurology | Primary: Internal Medicine

## 2021-10-13 NOTE — Progress Notes (Signed)
 PA attempted via cover my meds for pts medication Nexletol, per cover my meds a PA is already in process. Key: [id number]

## 2021-10-30 ENCOUNTER — Other Ambulatory Visit: Payer: Self-pay | Admitting: Internal Medicine

## 2021-10-30 DIAGNOSIS — Z1231 Encounter for screening mammogram for malignant neoplasm of breast: Secondary | ICD-10-CM

## 2021-12-20 ENCOUNTER — Other Ambulatory Visit: Payer: Self-pay | Admitting: Family Medicine

## 2021-12-20 DIAGNOSIS — M419 Scoliosis, unspecified: Secondary | ICD-10-CM

## 2021-12-20 DIAGNOSIS — M5441 Lumbago with sciatica, right side: Secondary | ICD-10-CM

## 2021-12-26 ENCOUNTER — Other Ambulatory Visit: Payer: Self-pay | Admitting: Family Medicine

## 2021-12-26 DIAGNOSIS — M5441 Lumbago with sciatica, right side: Secondary | ICD-10-CM

## 2021-12-26 DIAGNOSIS — M419 Scoliosis, unspecified: Secondary | ICD-10-CM

## 2021-12-26 DIAGNOSIS — W19XXXS Unspecified fall, sequela: Secondary | ICD-10-CM

## 2021-12-27 ENCOUNTER — Other Ambulatory Visit: Payer: Self-pay | Admitting: Family Medicine

## 2021-12-27 DIAGNOSIS — M5441 Lumbago with sciatica, right side: Secondary | ICD-10-CM

## 2022-01-04 ENCOUNTER — Ambulatory Visit: Payer: Medicare Other

## 2022-01-11 ENCOUNTER — Ambulatory Visit
Admission: RE | Admit: 2022-01-11 | Discharge: 2022-01-11 | Disposition: A | Discharge status: Home / Self Care | Payer: Medicare Other | Source: Ambulatory Visit | Attending: Family Medicine | Admitting: Family Medicine

## 2022-01-11 DIAGNOSIS — M5441 Lumbago with sciatica, right side: Secondary | ICD-10-CM

## 2022-01-11 IMAGING — MR MR LUMBAR SPINE W/O CM
4 of 5 series · 26 of 48 positions shown · non-contrast
Comparison: None Available.

CLINICAL DATA: Low back with right hip and leg pain

EXAM:
MRI LUMBAR SPINE WITHOUT CONTRAST
TECHNIQUE: Multiplanar, multisequence MR imaging of the lumbar spine was
performed. No intravenous contrast was administered.

[Series 3: T2 · sagittal · 4.0mm · 0.53mm/px · 6 of 15 slices shown (1 of 2)]
[im 1/15]
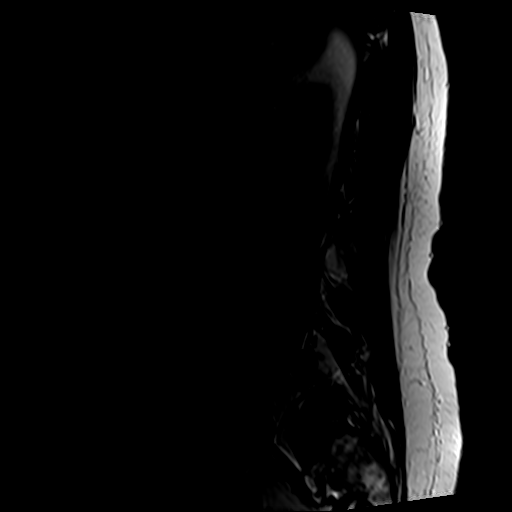
[im 3/15]
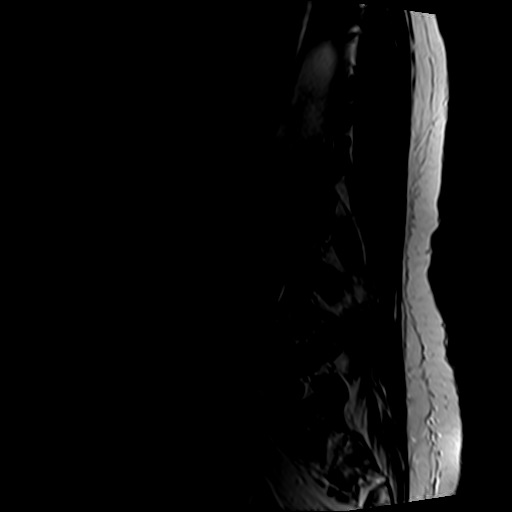
[im 6/15]
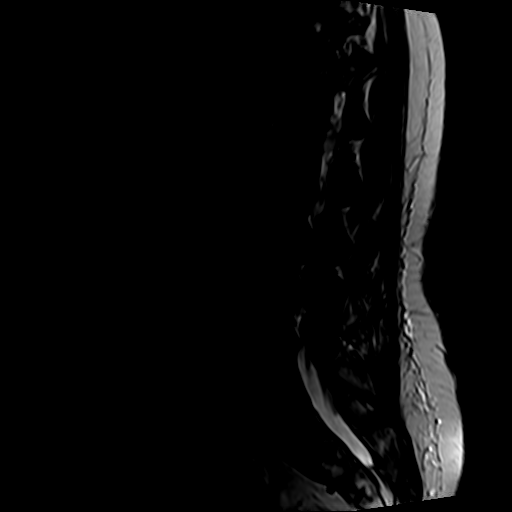
[im 9/15]
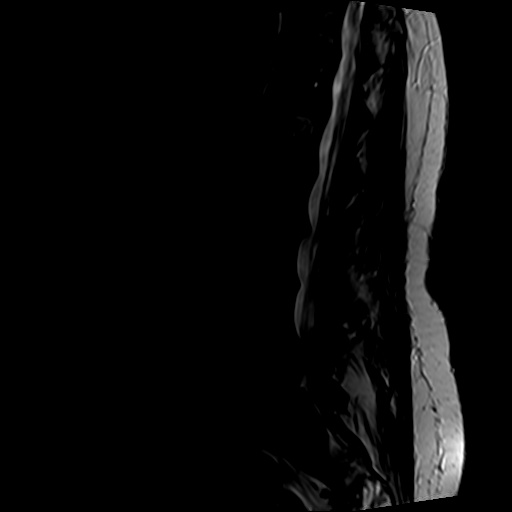
[im 12/15]
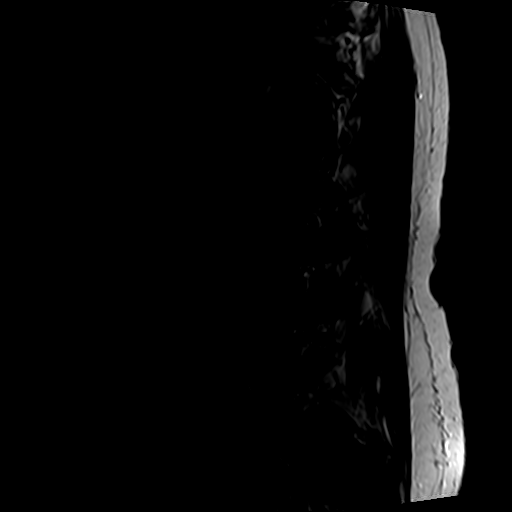
[im 15/15]
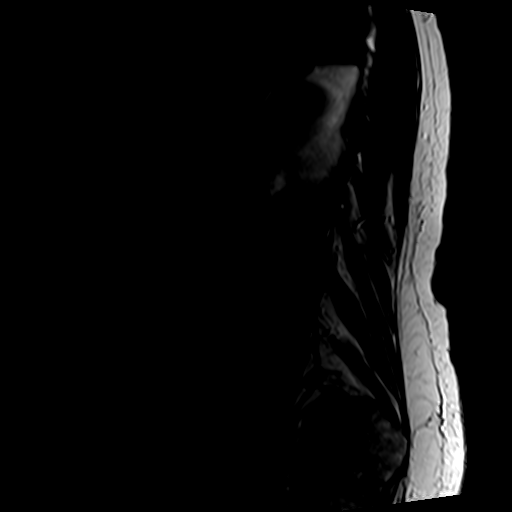

[Series 5: T1 · sagittal · 4.0mm · 0.53mm/px · 6 of 15 slices shown (1 of 2)]
[im 1/15]
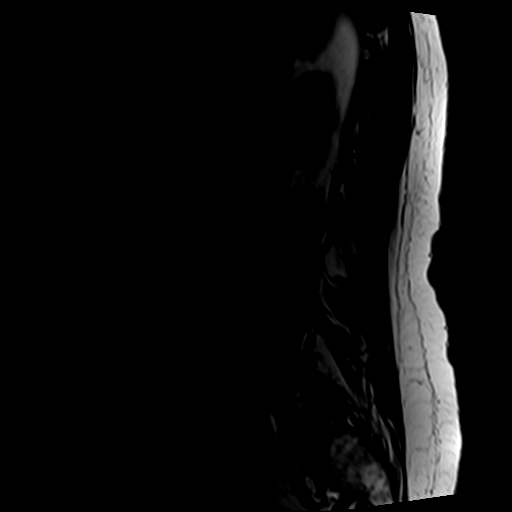
[im 3/15]
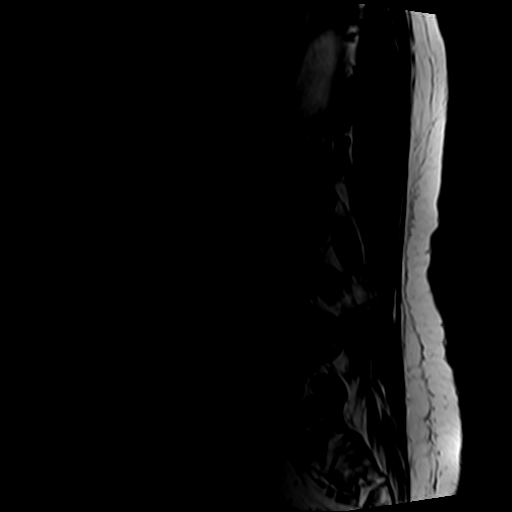
[im 6/15]
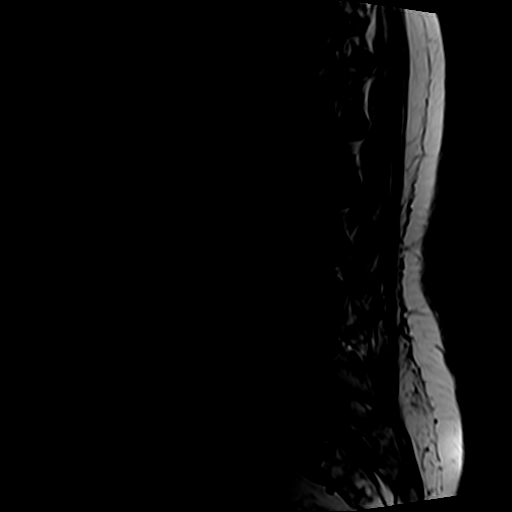
[im 9/15]
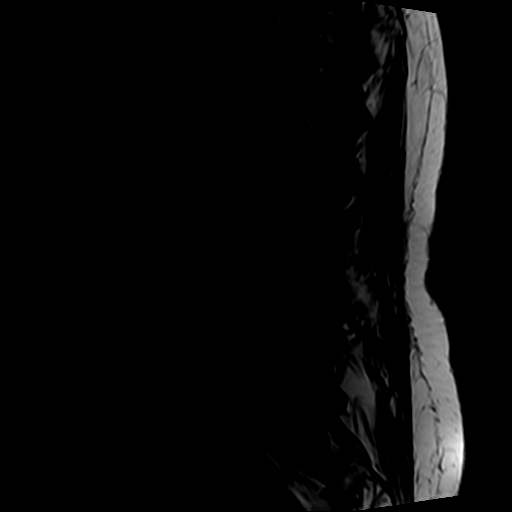
[im 12/15]
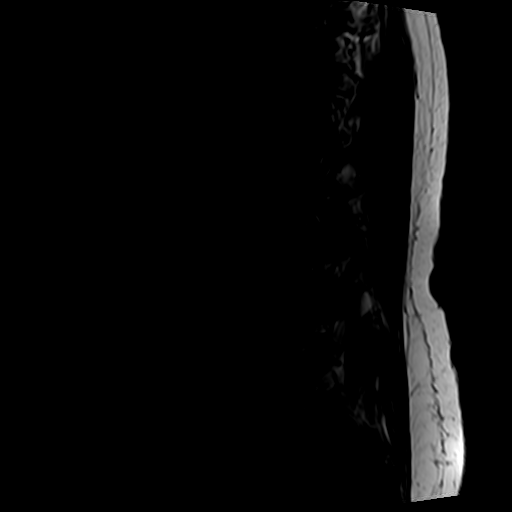
[im 15/15]
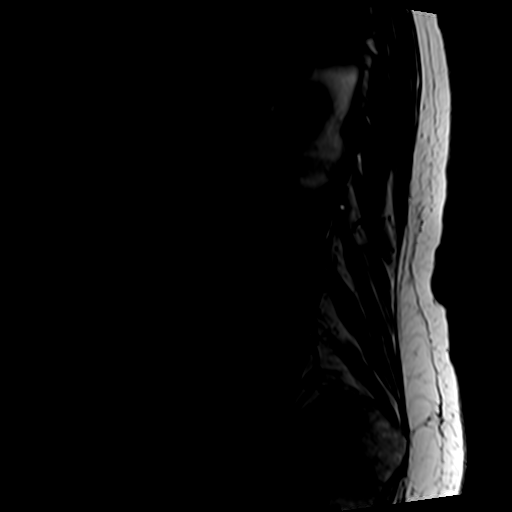

[Series 6: T2 · axial · 4.0mm · 0.70mm/px · z∈[-115,+91]mm · 9 of 37 slices shown (2 of 2)]
[im 1/37]
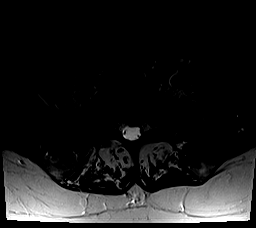
[im 6/37]
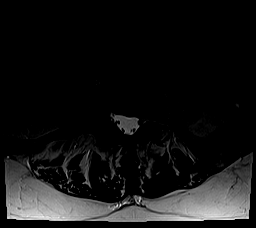
[im 11/37]
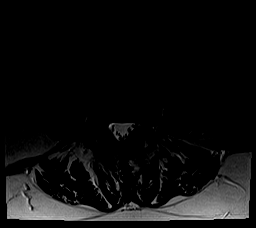
[im 16/37]
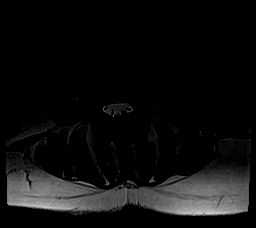
[im 19/37]
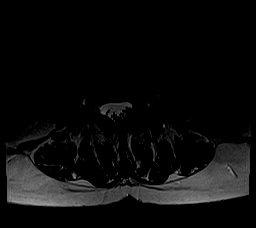
[im 21/37]
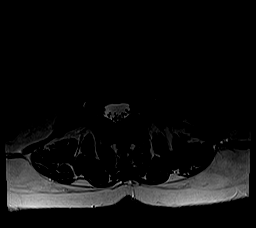
[im 26/37]
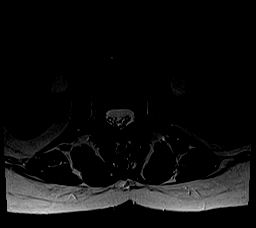
[im 31/37]
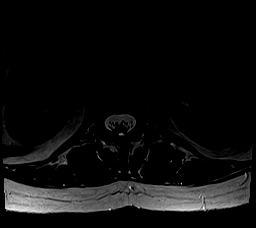
[im 37/37]
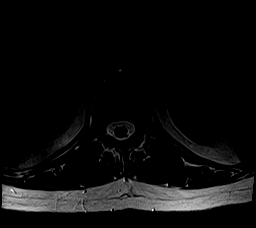

[Series 7: T1 · axial · 4.0mm · 0.35mm/px · z∈[-115,+60]mm · 5 of 37 slices shown (2 of 2)]
[im 1/37]
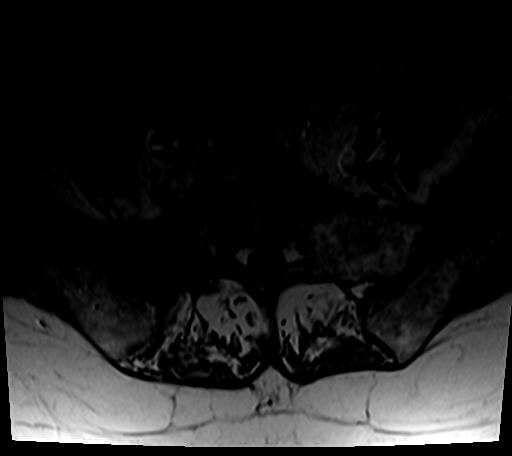
[im 6/37]
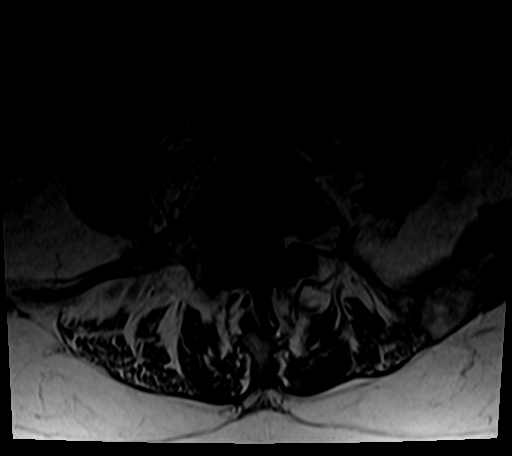
[im 11/37]
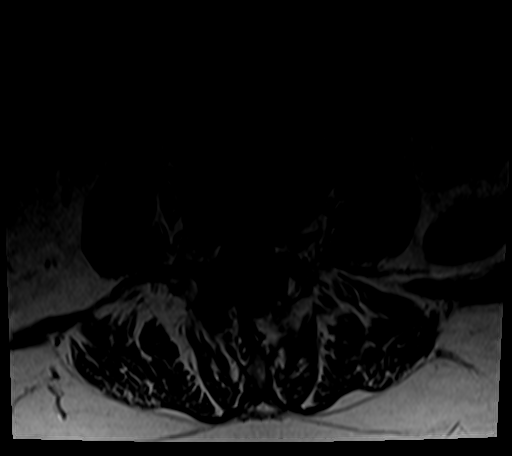
[im 19/37]
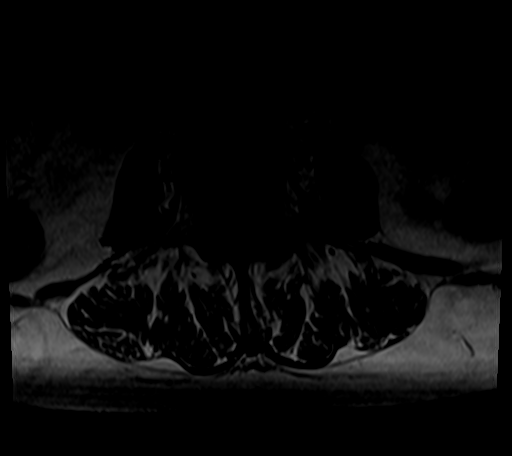
[im 31/37]
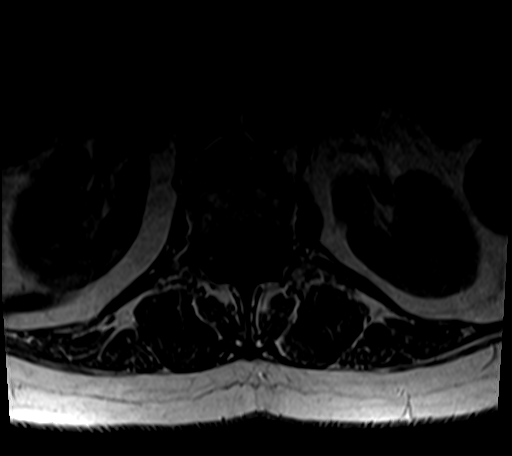

[26 of 48 positions shown; findings below may reference images not displayed]

FINDINGS: Segmentation:  Standard.

Alignment: No significant anteroposterior listhesis.
Dextrocurvature.

Vertebrae: Degenerative endplate irregularity, greatest at L2-L3.
Vertebral body hemangiomas are present, for example at L1 and L2.
Lumbar marrow signal is otherwise mildly heterogeneous without
substantial edema or suspicious lesion. Partially imaged low T1
signal in the right sacrum.

Conus medullaris and cauda equina: Conus extends to the L1-L2 level.
Conus and cauda equina appear normal.

Paraspinal and other soft tissues: Unremarkable.

Disc levels:

L1-L2: Mild facet arthropathy. No significant canal or foraminal
stenosis.

L2-L3: Disc bulge with endplate osteophytic ridging. No significant
canal stenosis. Minor foraminal stenosis.

L3-L4: Disc bulge slightly eccentric to the left. Mild facet
arthropathy. Minor canal stenosis. No right foraminal stenosis.
Minor left foraminal stenosis.

L4-L5: Disc bulge. Mild facet arthropathy. No significant canal or
right foraminal stenosis. Minor left foraminal stenosis.

L5-S1: Mild facet arthropathy. No significant canal or foraminal
stenosis.
IMPRESSION: Mild degenerative changes as detailed above without high-grade
stenosis.

Partially imaged abnormal signal in the right sacrum. May represent
insufficiency fracture or lesion. Recommend dedicated imaging (MRI
and/or CT of pelvis) for further evaluation.

These results will be called to the ordering clinician or
representative by the Radiologist Assistant, and communication
documented in the PACS or [REDACTED].

## 2022-01-15 ENCOUNTER — Other Ambulatory Visit: Payer: Self-pay | Admitting: Internal Medicine

## 2022-01-15 DIAGNOSIS — M25559 Pain in unspecified hip: Secondary | ICD-10-CM

## 2022-01-22 ENCOUNTER — Ambulatory Visit
Admission: RE | Admit: 2022-01-22 | Discharge: 2022-01-22 | Disposition: A | Discharge status: Home / Self Care | Payer: Medicare Other | Source: Ambulatory Visit | Attending: Internal Medicine | Admitting: Internal Medicine

## 2022-01-22 DIAGNOSIS — M25559 Pain in unspecified hip: Secondary | ICD-10-CM

## 2022-01-22 IMAGING — MR MR PELVIS WO/W CM
4 of 9 series · 22 of 48 positions shown · IV contrast (multihance)
Comparison: None Available.

CLINICAL DATA: Status post fall, back pain

EXAM:
MRI PELVIS WITHOUT AND WITH CONTRAST
TECHNIQUE: Multiplanar multisequence MR imaging of the pelvis was performed
both before and after administration of intravenous contrast.
CONTRAST:  13mL MULTIHANCE GADOBENATE DIMEGLUMINE 529 MG/ML IV SOLN

[Series 3: STIR · coronal · 4.0mm · 0.70mm/px · 4 of 31 slices shown]
[im 1/31]
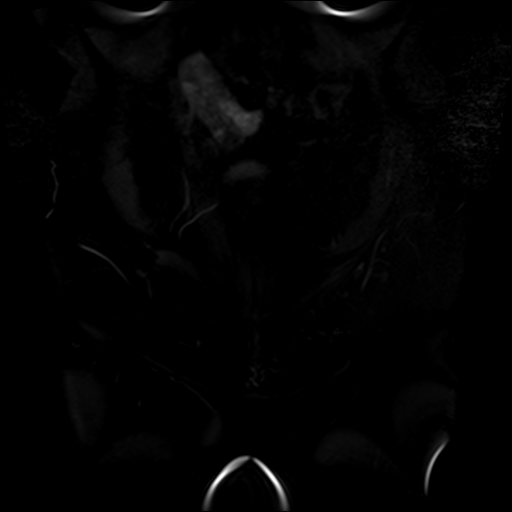
[im 11/31]
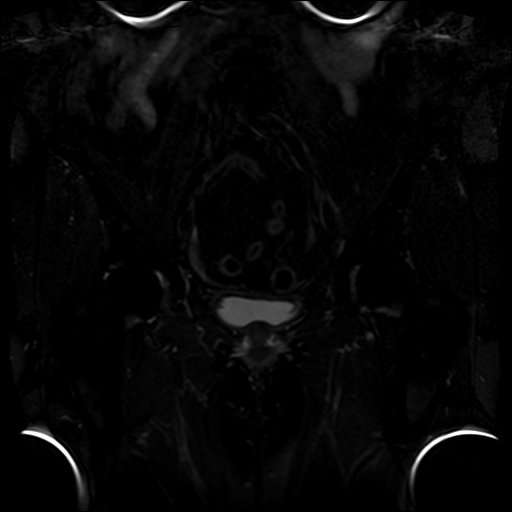
[im 21/31]
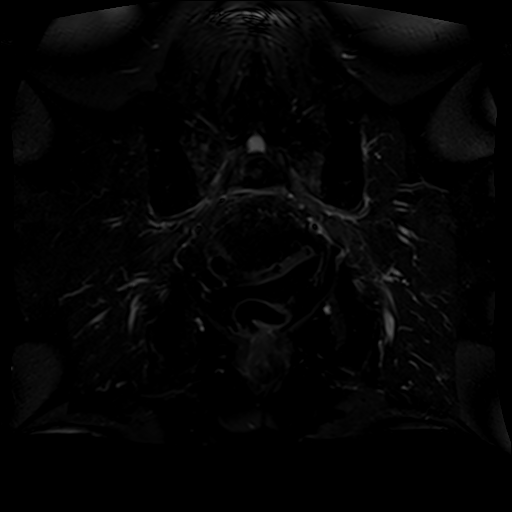
[im 31/31]
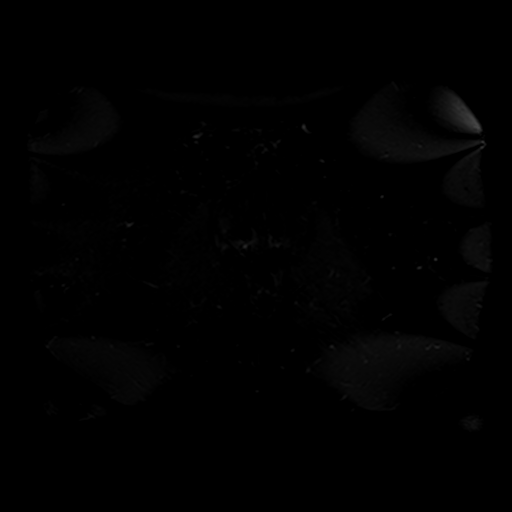

[Series 4: T2 fat-sat · sagittal · 4.0mm · 0.94mm/px · 4 of 29 slices shown (1 of 2)]
[im 1/29]
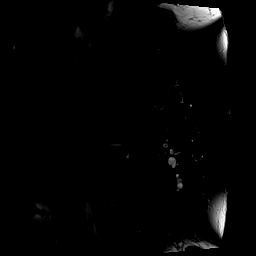
[im 10/29]
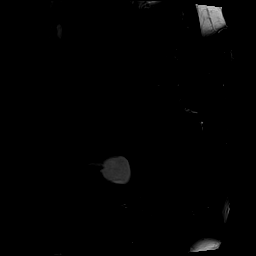
[im 19/29]
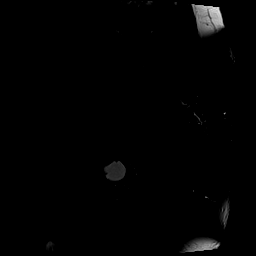
[im 29/29]
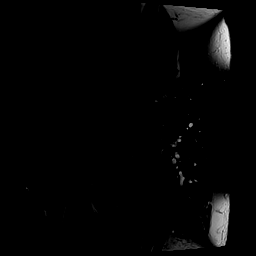

[Series 5: T1 · axial · 4.0mm · 0.59mm/px · z∈[-123,+75]mm · 7 of 42 slices shown]
[im 1/42]
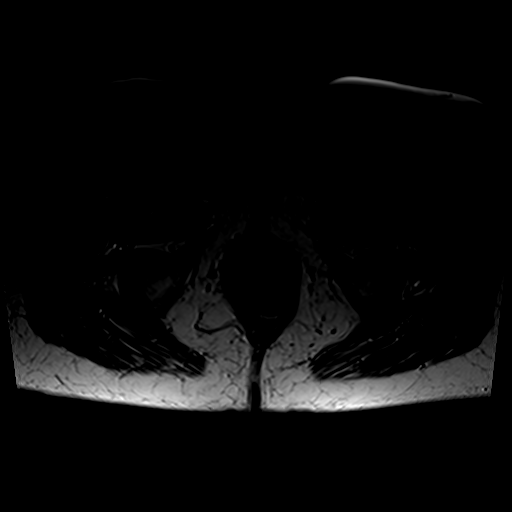
[im 7/42]
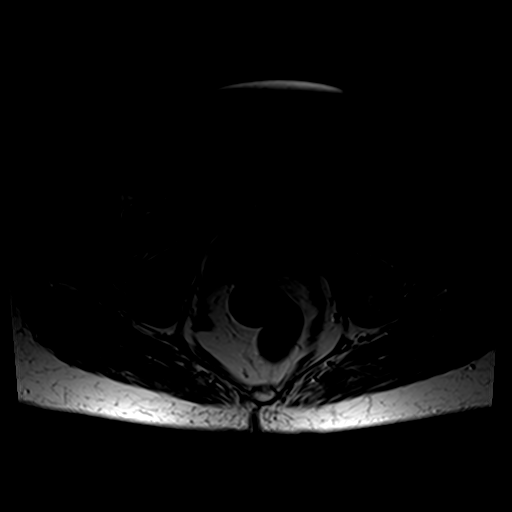
[im 14/42]
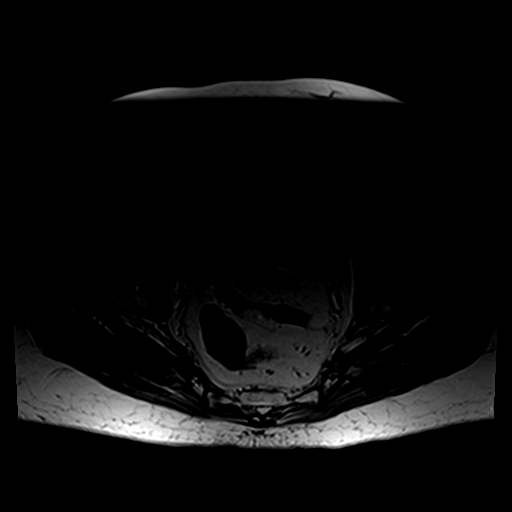
[im 21/42]
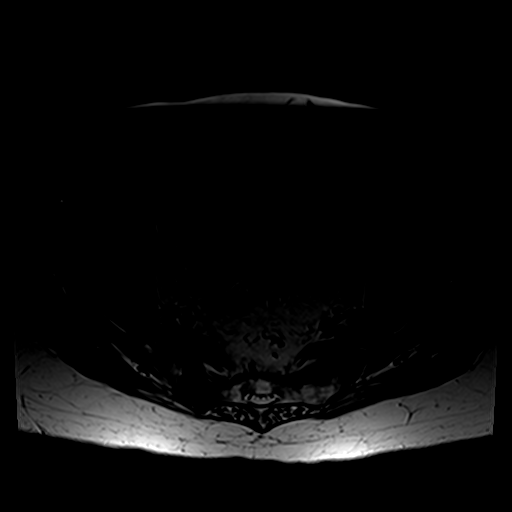
[im 28/42]
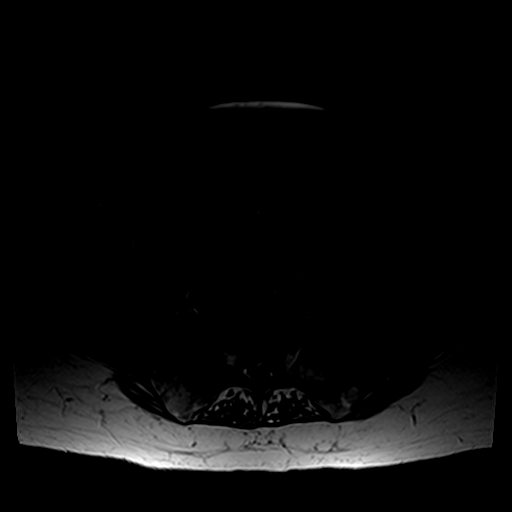
[im 35/42]
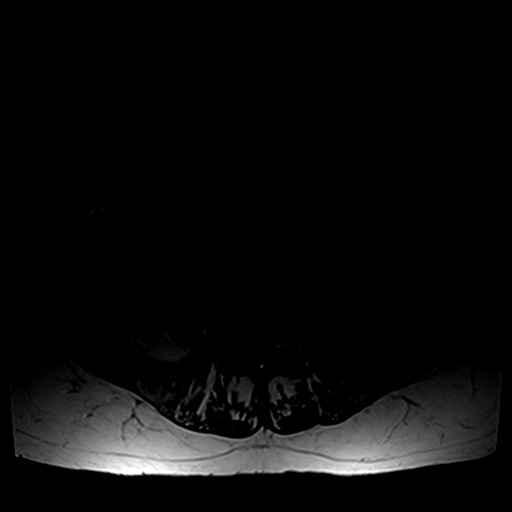
[im 42/42]
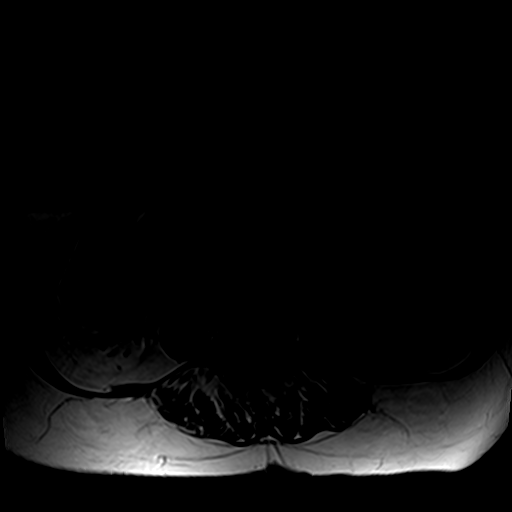

[Series 6: T2 fat-sat · axial · 4.0mm · 0.59mm/px · z∈[-118,+70]mm · 7 of 40 slices shown (2 of 2)]
[im 1/40]
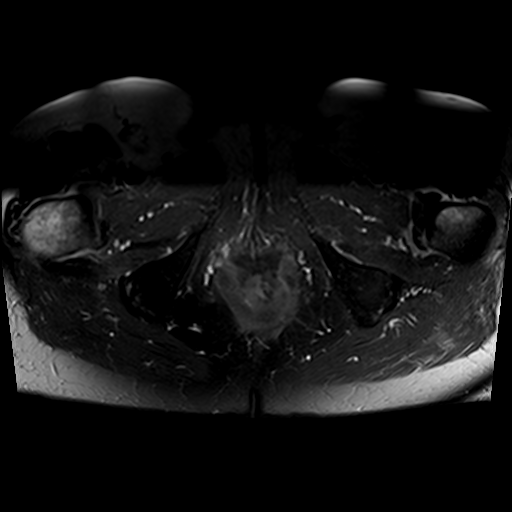
[im 7/40]
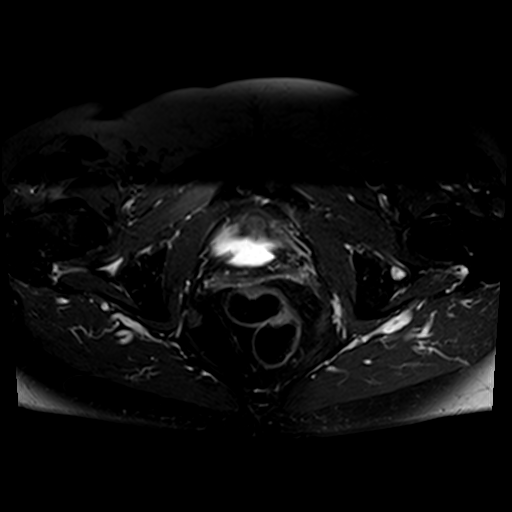
[im 14/40]
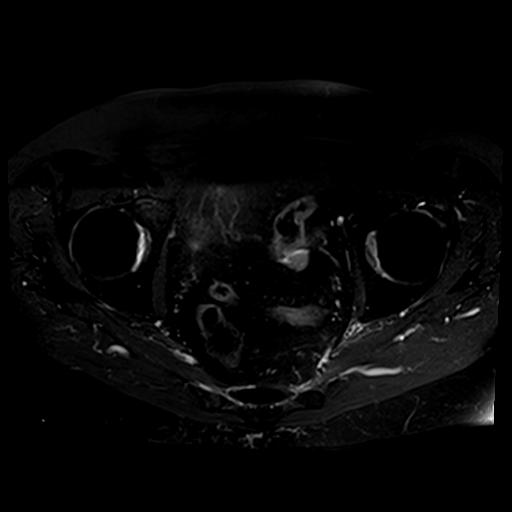
[im 20/40]
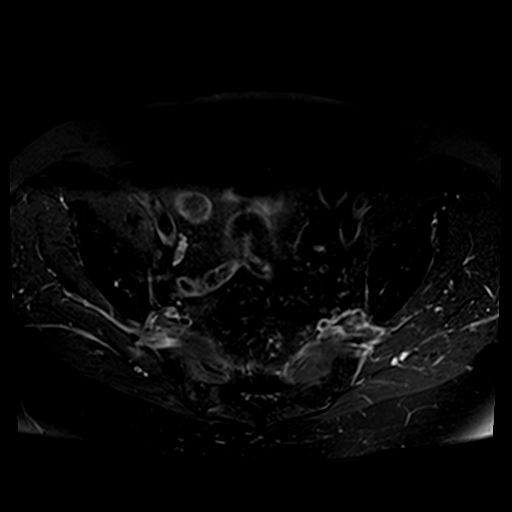
[im 27/40]
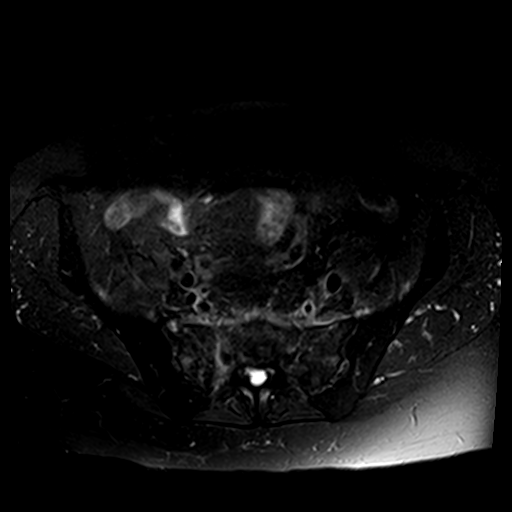
[im 33/40]
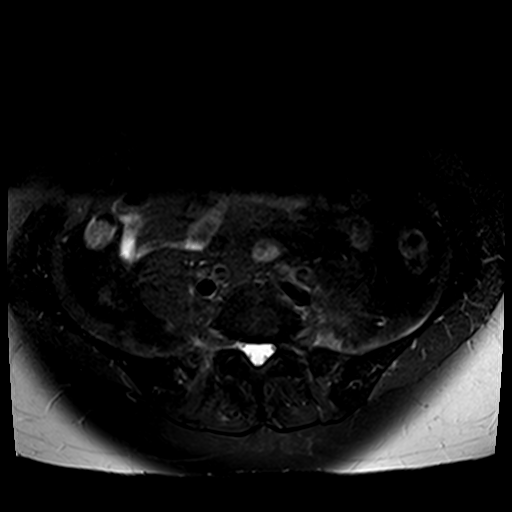
[im 40/40]
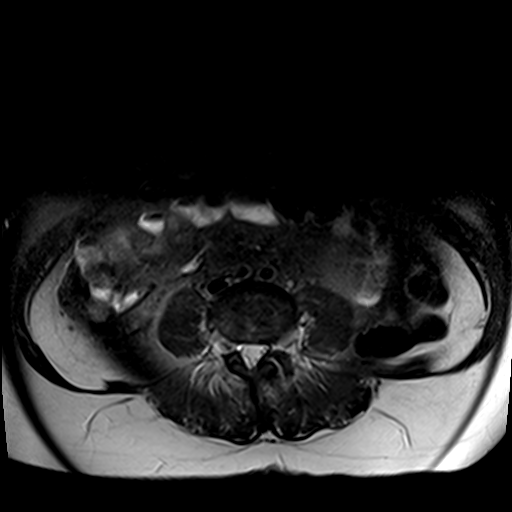

[22 of 48 positions shown; findings below may reference images not displayed]

FINDINGS: Bones/Joint/Cartilage

Longitudinal linear signal abnormality in the right and left sacral
ala with surrounding bone marrow edema consistent with bilateral
sacral insufficiency fractures. No SI joint widening or erosive
changes.

Disc desiccation at L4-5 and L5-S1. Broad-based disc bulge at L4-5.

No other fracture or dislocation. Normal alignment. No joint
effusion.

Ligaments, Muscles and Tendons

Muscles are normal. No muscle atrophy. No muscle edema. Piriformis
muscles are normal bilaterally without signal abnormality.

Soft tissue
No fluid collection or hematoma. No soft tissue mass. Normal
neurovascular bundles.
IMPRESSION: 1. Acute bilateral sacral insufficiency fractures with surrounding
bone marrow edema.

## 2022-01-22 MED ORDER — GADOBENATE DIMEGLUMINE 529 MG/ML IV SOLN
13.0000 mL | Freq: Once | INTRAVENOUS | Status: AC | PRN
  Administered 2022-01-22: 13 mL via INTRAVENOUS

## 2022-05-08 ENCOUNTER — Ambulatory Visit
Admission: RE | Admit: 2022-05-08 | Discharge: 2022-05-08 | Disposition: A | Discharge status: Home / Self Care | Payer: Medicare Other | Source: Ambulatory Visit | Attending: Internal Medicine | Admitting: Internal Medicine

## 2022-05-08 DIAGNOSIS — Z1231 Encounter for screening mammogram for malignant neoplasm of breast: Secondary | ICD-10-CM

## 2022-09-27 ENCOUNTER — Other Ambulatory Visit: Payer: Self-pay

## 2022-09-27 DIAGNOSIS — Z1231 Encounter for screening mammogram for malignant neoplasm of breast: Secondary | ICD-10-CM

## 2023-03-07 ENCOUNTER — Emergency Department
Admission: EM | Admit: 2023-03-07 | Discharge: 2023-03-07 | Disposition: A | Discharge status: Home / Self Care | Payer: Medicare Other | Attending: Emergency Medicine | Admitting: Emergency Medicine

## 2023-03-07 ENCOUNTER — Emergency Department: Payer: Medicare Other

## 2023-03-07 DIAGNOSIS — I10 Essential (primary) hypertension: Secondary | ICD-10-CM | POA: Diagnosis not present

## 2023-03-07 DIAGNOSIS — G4751 Confusional arousals: Secondary | ICD-10-CM | POA: Insufficient documentation

## 2023-03-07 DIAGNOSIS — R42 Dizziness and giddiness: Secondary | ICD-10-CM | POA: Diagnosis present

## 2023-03-07 DIAGNOSIS — R410 Disorientation, unspecified: Secondary | ICD-10-CM

## 2023-03-07 LAB — COMPREHENSIVE METABOLIC PANEL
ALT: 20 U/L (ref 0–44)
AST: 28 U/L (ref 15–41)
Albumin: 4.5 g/dL (ref 3.5–5.0)
Alkaline Phosphatase: 35 U/L — ABNORMAL LOW (ref 38–126)
Anion gap: 8 (ref 5–15)
BUN: 20 mg/dL (ref 8–23)
CO2: 26 mmol/L (ref 22–32)
Calcium: 9.7 mg/dL (ref 8.9–10.3)
Chloride: 106 mmol/L (ref 98–111)
Creatinine, Ser: 0.78 mg/dL (ref 0.44–1.00)
GFR, Estimated: >60 mL/min (ref >60)
Glucose, Bld: 100 mg/dL — ABNORMAL HIGH (ref 70–99)
Potassium: 3.9 mmol/L (ref 3.5–5.1)
Sodium: 140 mmol/L (ref 135–145)
Total Bilirubin: 0.8 mg/dL (ref 0.3–1.2)
Total Protein: 7.2 g/dL (ref 6.5–8.1)

## 2023-03-07 LAB — DIFFERENTIAL
Abs Immature Granulocytes: 0.02 10*3/uL (ref 0.00–0.07)
Basophils Absolute: 0 10*3/uL (ref 0.0–0.1)
Basophils Relative: 1 %
Eosinophils Absolute: 0.1 10*3/uL (ref 0.0–0.5)
Eosinophils Relative: 1 %
Immature Granulocytes: 0 %
Lymphocytes Relative: 15 %
Lymphs Abs: 0.9 10*3/uL (ref 0.7–4.0)
Monocytes Absolute: 0.3 10*3/uL (ref 0.1–1.0)
Monocytes Relative: 5 %
Neutro Abs: 5 10*3/uL (ref 1.7–7.7)
Neutrophils Relative %: 78 %

## 2023-03-07 LAB — CBC
HCT: 36.8 % (ref 36.0–46.0)
Hemoglobin: 12.1 g/dL (ref 12.0–15.0)
MCH: 29.3 pg (ref 26.0–34.0)
MCHC: 32.9 g/dL (ref 30.0–36.0)
MCV: 89.1 fL (ref 80.0–100.0)
Platelets: 258 10*3/uL (ref 150–400)
RBC: 4.13 MIL/uL (ref 3.87–5.11)
RDW: 12.2 % (ref 11.5–15.5)
WBC: 6.4 10*3/uL (ref 4.0–10.5)
nRBC: 0 % (ref 0.0–0.2)

## 2023-03-07 LAB — CBG MONITORING, ED: Glucose-Capillary: 89 mg/dL (ref 70–99)

## 2023-03-07 MED ORDER — LORAZEPAM 2 MG/ML IJ SOLN
1.0000 mg | Freq: Once | INTRAMUSCULAR | Status: DC
  Filled 2023-03-07: qty 1

## 2023-03-07 MED ORDER — LORAZEPAM 2 MG/ML IJ SOLN
1.0000 mg | Freq: Once | INTRAMUSCULAR | Status: AC
  Administered 2023-03-07: 1 mg via INTRAVENOUS

## 2023-03-07 NOTE — Discharge Instructions (Signed)
You have been seen in the emergency department after an episode of confusion.  Your workup in the emergency department is show normal results including a normal MRI of the brain.  Please follow-up with your doctor including your outpatient neurologist.  Return to the emergency department for any further episodes of confusion, any weakness or numbness of any arm or leg difficulty speaking, thinking, or any other symptom concerning to yourself.

## 2023-03-07 NOTE — ED Triage Notes (Signed)
Pt presents to the ED POV from home with husband. Pt states that she was sitting at her table doing some paperwork around 1130 today. She said that she looked up about 5 minutes later and states that she didn't remember doing the paperwork or where her husband was. States that she was then able to think back through the day and piece together all the information. Pt reports a HA that started around 1120 and has continued as well as some dizziness when standing.   Spoke with Roxan Hockey, MD regarding patient.

## 2023-03-07 NOTE — ED Triage Notes (Signed)
First Nurse: Pt here with dizziness, pain in her right eye, and AMS from UC. Pt has a hx of claustrophobia.

## 2023-03-07 NOTE — ED Provider Notes (Signed)
[facility] Provider Note    Event Date/Time   First MD Initiated Contact with Patient 03/07/23 1552     (approximate)  History   Chief Complaint: Dizziness  HPI  Barbara Chandler is a 82 y.o. female presents to the emergency department with an episode of confusion/amnesia.  According to the patient around 11:00 this morning she states she sat down to do some paperwork, states she had to have a bowel movement.  States she was straining fairly hard to have a bowel movement, states she went back to her table afterwards to do the paperwork but could not recall what paperwork needed to be done could not recall where she was or why she was there.  She states symptoms lasted for 5 to 10 minutes and then resolved on their own.  Patient denies any weakness or numbness or difficulty speaking during this.  Denies any history of CVA.  Husband arrived shortly afterwards and states she has been acting normal ever since.  Physical Exam   Triage Vital Signs: ED Triage Vitals  Encounter Vitals Group     BP 03/07/23 1426 (!) 155/68     Systolic BP Percentile --      Diastolic BP Percentile --      Pulse Rate 03/07/23 1426 78     Resp 03/07/23 1426 16     Temp 03/07/23 1426 98.3 F (36.8 C)     Temp src --      SpO2 03/07/23 1426 99 %     Weight 03/07/23 1427 143 lb (64.9 kg)     Height 03/07/23 1427 5\' 5"  (1.651 m)     Head Circumference --      Peak Flow --      Pain Score 03/07/23 1444 6     Pain Loc --      Pain Education --      Exclude from Growth Chart --     Most recent vital signs: Vitals:   03/07/23 1426  BP: (!) 155/68  Pulse: 78  Resp: 16  Temp: 98.3 F (36.8 C)  SpO2: 99%    General: Awake, no distress.  CV:  Good peripheral perfusion.  Regular rate and rhythm  Resp:  Normal effort.  Equal breath sounds bilaterally.  Abd:  No distention.  Soft, nontender.  No rebound or guarding. Other:  Equal grip strength bilaterally.  5/5 motor in both upper  extremities and lower extremities.  No pronator drift.  Cranial nerves intact.  Reassuring neurological exam.   ED Results / Procedures / Treatments   EKG  EKG viewed and interpreted by myself shows a normal sinus rhythm at 78 bpm with a narrow QRS, normal axis, normal intervals, no concerning ST changes.  Reassuring EKG.  RADIOLOGY  I have reviewed and interpreted the CT head images.  No bleed seen on my evaluation. Radiology is read the CT scan is negative for acute abnormality.   MEDICATIONS ORDERED IN ED: Medications  LORazepam (ATIVAN) injection 1 mg (has no administration in time range)  LORazepam (ATIVAN) injection 1 mg (has no administration in time range)     IMPRESSION / MDM / ASSESSMENT AND PLAN / ED COURSE  I reviewed the triage vital signs and the nursing notes.  Patient's presentation is most consistent with acute presentation with potential threat to life or bodily function.  Patient presents to the emergency department for 5 to 10 minutes of confusion/amnesia.  Patient states symptoms occurred around 11:00 this  morning.  Patient does states she has been having a mild dull headache for the past week or so intermittently as well as some intermittent pain in her right eye although none currently.  Patient has an intact neurological exam.  Reassuring physical exam, reassuring vitals besides slight hypertension.  EKG is reassuring labs are reassuring and CT scan is normal.  Normal CBC and chemistry.  Given the patient's symptoms I discussed the patient with Dr. Otelia Limes of neurology.  He states does not seem consistent with a TIA.  As the patient is back to her baseline with no symptoms currently he recommends MRI.  If the MRI is negative patient could be discharged home and if positive patient will require admission to the hospital service for ongoing workup and treatment with neurological consultation.  Patient has an outpatient neurologist to follow-up with if the MRI is  negative.  Patient states severe claustrophobia states she usually requires multiple medications to have an MRI performed.  We will dose IV Ativan now and an additional dose just before MRI imaging.  Patient agreeable to try the MRI.  Patient's MRI is negative.  We will discharge patient home she will follow-up with her outpatient neurologist.  Patient and husband agreeable to plan of care.  FINAL CLINICAL IMPRESSION(S) / ED DIAGNOSES   Confusion   Note:  This document was prepared using Dragon voice recognition software and may include unintentional dictation errors.   Minna Antis, MD 03/07/23 [phone]

## 2023-05-10 ENCOUNTER — Ambulatory Visit
Admission: RE | Admit: 2023-05-10 | Discharge: 2023-05-10 | Disposition: A | Discharge status: Home / Self Care | Payer: Medicare Other | Source: Ambulatory Visit

## 2023-05-10 DIAGNOSIS — Z1231 Encounter for screening mammogram for malignant neoplasm of breast: Secondary | ICD-10-CM | POA: Insufficient documentation

## 2023-10-26 ENCOUNTER — Emergency Department

## 2023-10-26 ENCOUNTER — Other Ambulatory Visit: Payer: Self-pay

## 2023-10-26 ENCOUNTER — Observation Stay
Admission: EM | Admit: 2023-10-26 | Discharge: 2023-10-27 | Disposition: A | Discharge status: Home / Self Care | Attending: Internal Medicine | Admitting: Internal Medicine

## 2023-10-26 DIAGNOSIS — R1013 Epigastric pain: Secondary | ICD-10-CM | POA: Diagnosis present

## 2023-10-26 DIAGNOSIS — K8050 Calculus of bile duct without cholangitis or cholecystitis without obstruction: Secondary | ICD-10-CM | POA: Diagnosis not present

## 2023-10-26 DIAGNOSIS — R079 Chest pain, unspecified: Secondary | ICD-10-CM | POA: Diagnosis present

## 2023-10-26 DIAGNOSIS — Z7901 Long term (current) use of anticoagulants: Secondary | ICD-10-CM | POA: Diagnosis not present

## 2023-10-26 DIAGNOSIS — G7000 Myasthenia gravis without (acute) exacerbation: Secondary | ICD-10-CM | POA: Diagnosis not present

## 2023-10-26 DIAGNOSIS — E785 Hyperlipidemia, unspecified: Secondary | ICD-10-CM | POA: Insufficient documentation

## 2023-10-26 DIAGNOSIS — K8020 Calculus of gallbladder without cholecystitis without obstruction: Secondary | ICD-10-CM

## 2023-10-26 DIAGNOSIS — E039 Hypothyroidism, unspecified: Secondary | ICD-10-CM | POA: Diagnosis not present

## 2023-10-26 DIAGNOSIS — Z79899 Other long term (current) drug therapy: Secondary | ICD-10-CM | POA: Diagnosis not present

## 2023-10-26 DIAGNOSIS — R748 Abnormal levels of other serum enzymes: Secondary | ICD-10-CM | POA: Diagnosis not present

## 2023-10-26 HISTORY — DX: Essential (primary) hypertension: I10

## 2023-10-26 HISTORY — DX: Rheumatic tricuspid valve disease, unspecified: I07.9

## 2023-10-26 LAB — COMPREHENSIVE METABOLIC PANEL
ALT: 118 U/L — ABNORMAL HIGH (ref 0–44)
AST: 198 U/L — ABNORMAL HIGH (ref 15–41)
Albumin: 4.1 g/dL (ref 3.5–5.0)
Alkaline Phosphatase: 44 U/L (ref 38–126)
Anion gap: 6 (ref 5–15)
BUN: 13 mg/dL (ref 8–23)
CO2: 27 mmol/L (ref 22–32)
Calcium: 9 mg/dL (ref 8.9–10.3)
Chloride: 105 mmol/L (ref 98–111)
Creatinine, Ser: 0.72 mg/dL (ref 0.44–1.00)
GFR, Estimated: >60 mL/min (ref >60)
Glucose, Bld: 100 mg/dL — ABNORMAL HIGH (ref 70–99)
Potassium: 3.5 mmol/L (ref 3.5–5.1)
Sodium: 138 mmol/L (ref 135–145)
Total Bilirubin: 1.5 mg/dL — ABNORMAL HIGH (ref 0.0–1.2)
Total Protein: 6.4 g/dL — ABNORMAL LOW (ref 6.5–8.1)

## 2023-10-26 LAB — CREATININE, SERUM
Creatinine, Ser: 0.73 mg/dL (ref 0.44–1.00)
GFR, Estimated: >60 mL/min (ref >60)

## 2023-10-26 LAB — CBC
HCT: 33.8 % — ABNORMAL LOW (ref 36.0–46.0)
HCT: 34.3 % — ABNORMAL LOW (ref 36.0–46.0)
Hemoglobin: 11.2 g/dL — ABNORMAL LOW (ref 12.0–15.0)
Hemoglobin: 11.5 g/dL — ABNORMAL LOW (ref 12.0–15.0)
MCH: 29.8 pg (ref 26.0–34.0)
MCH: 29.8 pg (ref 26.0–34.0)
MCHC: 33.1 g/dL (ref 30.0–36.0)
MCHC: 33.5 g/dL (ref 30.0–36.0)
MCV: 89.9 fL (ref 80.0–100.0)
MCV: 88.9 fL (ref 80.0–100.0)
Platelets: 238 10*3/uL (ref 150–400)
Platelets: 258 10*3/uL (ref 150–400)
RBC: 3.76 MIL/uL — ABNORMAL LOW (ref 3.87–5.11)
RBC: 3.86 MIL/uL — ABNORMAL LOW (ref 3.87–5.11)
RDW: 12.4 % (ref 11.5–15.5)
RDW: 12.5 % (ref 11.5–15.5)
WBC: 8.4 10*3/uL (ref 4.0–10.5)
WBC: 5.8 10*3/uL (ref 4.0–10.5)
nRBC: 0 % (ref 0.0–0.2)
nRBC: 0 % (ref 0.0–0.2)

## 2023-10-26 LAB — LIPASE, BLOOD: Lipase: 692 U/L — ABNORMAL HIGH (ref 11–51)

## 2023-10-26 LAB — TROPONIN I (HIGH SENSITIVITY): Troponin I (High Sensitivity): 7 ng/L (ref <18)

## 2023-10-26 MED ORDER — MORPHINE SULFATE (PF) 2 MG/ML IV SOLN
2.0000 mg | Freq: Once | INTRAVENOUS | Status: AC
  Administered 2023-10-26: 2 mg via INTRAVENOUS
  Filled 2023-10-26: qty 1

## 2023-10-26 MED ORDER — EZETIMIBE 10 MG PO TABS
10.0000 mg | ORAL_TABLET | Freq: Every day | ORAL | Status: DC
  Administered 2023-10-27: 10 mg via ORAL
  Filled 2023-10-26: qty 1

## 2023-10-26 MED ORDER — LORAZEPAM 2 MG/ML IJ SOLN
0.5000 mg | Freq: Once | INTRAMUSCULAR | Status: AC
  Administered 2023-10-26: 0.5 mg via INTRAVENOUS
  Filled 2023-10-26: qty 1

## 2023-10-26 MED ORDER — VITAMIN D3 10 MCG (400 UNIT) PO TABS: 400.0000 [IU] | ORAL_TABLET | Freq: Every day | ORAL | Status: DC

## 2023-10-26 MED ORDER — HYDROMORPHONE HCL 1 MG/ML IJ SOLN: 0.5000 mg | INTRAMUSCULAR | Status: DC | PRN

## 2023-10-26 MED ORDER — METOPROLOL SUCCINATE ER 25 MG PO TB24
25.0000 mg | ORAL_TABLET | Freq: Every day | ORAL | Status: DC
  Administered 2023-10-27: 25 mg via ORAL
  Filled 2023-10-26: qty 1

## 2023-10-26 MED ORDER — MYCOPHENOLATE MOFETIL 250 MG PO CAPS
500.0000 mg | ORAL_CAPSULE | Freq: Two times a day (BID) | ORAL | Status: DC
  Administered 2023-10-27 (×2): 500 mg via ORAL
  Filled 2023-10-26 (×3): qty 2

## 2023-10-26 MED ORDER — ASPIRIN 81 MG PO TBEC
81.0000 mg | DELAYED_RELEASE_TABLET | Freq: Every day | ORAL | Status: DC
  Administered 2023-10-27: 81 mg via ORAL
  Filled 2023-10-26: qty 1

## 2023-10-26 MED ORDER — PYRIDOXINE HCL 25 MG PO TABS
25.0000 mg | ORAL_TABLET | Freq: Every day | ORAL | Status: DC
  Administered 2023-10-27: 25 mg via ORAL
  Filled 2023-10-26: qty 1

## 2023-10-26 MED ORDER — ONDANSETRON HCL 4 MG/2ML IJ SOLN
4.0000 mg | Freq: Once | INTRAMUSCULAR | Status: AC
  Administered 2023-10-26: 4 mg via INTRAVENOUS
  Filled 2023-10-26: qty 2

## 2023-10-26 MED ORDER — LEVOTHYROXINE SODIUM 100 MCG PO TABS
100.0000 ug | ORAL_TABLET | Freq: Every day | ORAL | Status: DC
  Administered 2023-10-27: 100 ug via ORAL
  Filled 2023-10-26: qty 1

## 2023-10-26 MED ORDER — VITAMIN B-12 1000 MCG PO TABS
500.0000 ug | ORAL_TABLET | Freq: Every day | ORAL | Status: DC
  Administered 2023-10-27: 500 ug via ORAL
  Filled 2023-10-26: qty 1

## 2023-10-26 MED ORDER — FOLIC ACID 1 MG PO TABS
500.0000 ug | ORAL_TABLET | Freq: Every day | ORAL | Status: DC
  Administered 2023-10-27: 0.5 mg via ORAL
  Filled 2023-10-26: qty 1

## 2023-10-26 MED ORDER — ENOXAPARIN SODIUM 40 MG/0.4ML IJ SOSY
40.0000 mg | PREFILLED_SYRINGE | INTRAMUSCULAR | Status: DC
  Administered 2023-10-26: 40 mg via SUBCUTANEOUS
  Filled 2023-10-26: qty 0.4

## 2023-10-26 MED ORDER — GADOBUTROL 1 MMOL/ML IV SOLN
6.0000 mL | Freq: Once | INTRAVENOUS | Status: AC | PRN
  Administered 2023-10-26: 6 mL via INTRAVENOUS

## 2023-10-26 MED ORDER — ONDANSETRON HCL 4 MG/2ML IJ SOLN: 4.0000 mg | Freq: Four times a day (QID) | INTRAMUSCULAR | Status: DC | PRN

## 2023-10-26 MED ORDER — VITAMIN D 25 MCG (1000 UNIT) PO TABS
1000.0000 [IU] | ORAL_TABLET | Freq: Every day | ORAL | Status: DC
  Administered 2023-10-27: 1000 [IU] via ORAL
  Filled 2023-10-26: qty 1

## 2023-10-26 MED ORDER — POLYETHYLENE GLYCOL 3350 17 GM/SCOOP PO POWD
119.0000 g | Freq: Every day | ORAL | Status: DC | PRN
Start: 2023-10-26 — End: 2023-10-27

## 2023-10-26 MED ORDER — ADULT MULTIVITAMIN W/MINERALS CH
1.0000 | ORAL_TABLET | Freq: Every day | ORAL | Status: DC
  Administered 2023-10-27: 1 via ORAL
  Filled 2023-10-26: qty 1

## 2023-10-26 MED ORDER — BIOTIN PLUS KERATIN 10000-100 MCG-MG PO TABS: 1.0000 | ORAL_TABLET | Freq: Every day | ORAL | Status: DC

## 2023-10-26 MED ORDER — CARBOXYMETHYLCELLULOSE SODIUM 0.5 % OP SOLN: 1.0000 [drp] | Freq: Every day | OPHTHALMIC | Status: DC | PRN

## 2023-10-26 MED ORDER — BEMPEDOIC ACID-EZETIMIBE 180-10 MG PO TABS: 1.0000 | ORAL_TABLET | Freq: Every day | ORAL | Status: DC

## 2023-10-26 MED ORDER — POLYVINYL ALCOHOL 1.4 % OP SOLN: 1.0000 [drp] | Freq: Every day | OPHTHALMIC | Status: DC | PRN

## 2023-10-26 NOTE — Assessment & Plan Note (Signed)
  Hyperlipidemia Continuing the patient's aspirin as well as ezetimibe from home

## 2023-10-26 NOTE — ED Notes (Signed)
 MRI pre-screening pt via phone.

## 2023-10-26 NOTE — ED Notes (Signed)
 Pt ambulated to toilet in the room without assistance from staff. Pt tolerated activity well and was reconnected to VS monitoring. Pt's bed is in the lowest, locked position with call bell in reach.

## 2023-10-26 NOTE — ED Notes (Signed)
 [name] at bedside

## 2023-10-26 NOTE — Progress Notes (Signed)
 PHARMACIST - PHYSICIAN ORDER COMMUNICATION  CONCERNING: P&T Medication Policy on Herbal Medications  DESCRIPTION:  This patient's order for:  Specialty Vitamins Products (BIOTIN PLUS KERATIN) 10000-100 MCG-MG TABS   has been noted.  This product(s) is classified as an "herbal" or natural product. Due to a lack of definitive safety studies or FDA approval, nonstandard manufacturing practices, plus the potential risk of unknown drug-drug interactions while on inpatient medications, the Pharmacy and Therapeutics Committee does not permit the use of "herbal" or natural products of this type within [facility].   ACTION TAKEN: The pharmacy department is unable to verify this order at this time and your patient has been informed of this safety policy. Please reevaluate patient's clinical condition at discharge and address if the herbal or natural product(s) should be resumed at that time.

## 2023-10-26 NOTE — ED Provider Notes (Signed)
 Patient reassessed.  Remains nontoxic-appearing.  MRCP were results discussed with patient family at bedside.  I discussed the case in consultation with GI.  Given her age and risk factors and presentation do believe that hospitalization for observation clinically indicated given her elevation of LFTs as well as lipase.  She is not requiring any additional IV pain medications at this moment but has had some significant pain throughout the day.  Discussed the case in consultation with hospitalist who agrees with plan.   Willy Eddy, MD 10/26/23 [phone]

## 2023-10-26 NOTE — Assessment & Plan Note (Signed)
 Hypothyroidism Continue the patient's Synthroid 100 mcg daily

## 2023-10-26 NOTE — ED Provider Notes (Signed)
 [facility] Provider Note    Event Date/Time   First MD Initiated Contact with Patient 10/26/23 1127     (approximate)   History   Chest Pain   HPI  Barbara Chandler is a 83 y.o. female who presents with complaints of abdominal pain/chest pain.  Patient describes epigastric discomfort which started yesterday which she initially described as indigestion.  She reports the pain is better today but continues to bother her so she decided to come get checked out.  No history of heart disease reported and she describes her pain is more in her upper abdomen and then in her chest.  No cough or shortness of breath     Physical Exam   Triage Vital Signs: ED Triage Vitals  Encounter Vitals Group     BP 10/26/23 1123 (!) 170/65     Systolic BP Percentile --      Diastolic BP Percentile --      Pulse Rate 10/26/23 1123 70     Resp 10/26/23 1123 20     Temp 10/26/23 1125 97.9 F (36.6 C)     Temp Source 10/26/23 1123 Oral     SpO2 10/26/23 1123 100 %     Weight 10/26/23 1124 62.1 kg (137 lb)     Height 10/26/23 1124 1.651 m (5\' 5" )     Head Circumference --      Peak Flow --      Pain Score 10/26/23 1118 5     Pain Loc --      Pain Education --      Exclude from Growth Chart --     Most recent vital signs: Vitals:   10/26/23 1330 10/26/23 1400  BP: (!) 154/64 (!) 145/53  Pulse: 62 60  Resp: 16 13  Temp:    SpO2: 100% 100%     General: Awake, no distress.  CV:  Good peripheral perfusion.  Regular rate and rhythm Resp:  Normal effort.  Clear to auscultation bilaterally Abd:  No distention.  Point tenderness to palpation in the right upper quadrant which makes her gasp Other:  No calf pain or swelling   ED Results / Procedures / Treatments   Labs (all labs ordered are listed, but only abnormal results are displayed) Labs Reviewed  CBC - Abnormal; Notable for the following components:      Result Value   RBC 3.76 (*)    Hemoglobin 11.2 (*)     HCT 33.8 (*)    All other components within normal limits  COMPREHENSIVE METABOLIC PANEL - Abnormal; Notable for the following components:   Glucose, Bld 100 (*)    Total Protein 6.4 (*)    AST 198 (*)    ALT 118 (*)    Total Bilirubin 1.5 (*)    All other components within normal limits  LIPASE, BLOOD - Abnormal; Notable for the following components:   Lipase 692 (*)    All other components within normal limits  TROPONIN I (HIGH SENSITIVITY)     EKG  ED ECG REPORT I, Jene Every, the attending physician, personally viewed and interpreted this ECG.  Date: 10/26/2023  Rhythm: normal sinus rhythm QRS Axis: normal Intervals: normal ST/T Wave abnormalities: normal Narrative Interpretation: no evidence of acute ischemia    RADIOLOGY Ultrasound viewed interpret by me, no evidence of gallbladder wall thickening, dilated CBD noted    PROCEDURES:  Critical Care performed:   Procedures   MEDICATIONS ORDERED IN ED: Medications  morphine (PF) 2 MG/ML injection 2 mg (2 mg Intravenous Given 10/26/23 1315)  ondansetron (ZOFRAN) injection 4 mg (4 mg Intravenous Given 10/26/23 1315)  LORazepam (ATIVAN) injection 0.5 mg (0.5 mg Intravenous Given 10/26/23 1445)     IMPRESSION / MDM / ASSESSMENT AND PLAN / ED COURSE  I reviewed the triage vital signs and the nursing notes. Patient's presentation is most consistent with acute presentation with potential threat to life or bodily function.  Patient presents with chest pain/abdominal pain as detailed above, suspicious for gastritis versus cholecystitis versus pancreatitis.  Less likely ACS  Will obtain EKG, high sensitive troponin, chest x-ray, ultrasound of the right upper quadrant, labs including lipase, CMP.  Will treat with morphine, Zofran  Ultrasound demonstrates enlarged common bile duct and elevated LFTs, will send for MRCP, have ordered IV Ativan to be given before MRI given that she is claustrophobic  I have asked my  colleague to follow-up on MRCP results        FINAL CLINICAL IMPRESSION(S) / ED DIAGNOSES   Final diagnoses:  Choledocholithiasis     Rx / DC Orders   ED Discharge Orders     None        Note:  This document was prepared using Dragon voice recognition software and may include unintentional dictation errors.   Jene Every, MD 10/26/23 1504

## 2023-10-26 NOTE — Assessment & Plan Note (Addendum)
   Myasthenia gravis Continuing the patient's home CellCept 500 mg twice daily p.o. Given the patient's overall nontoxic we will avoid respiratory mechanics at this time If the patient becomes infected or worsens we will add respiratory mechanics Continue Vit D and Pyridoxine

## 2023-10-26 NOTE — Assessment & Plan Note (Addendum)
     Suspect passed CBD stone (choledocholithiasis) Patient has total bilirubin elevation of 1.5 AST 198 ALT 118 new from 7 months ago This was accompanied by severe abdominal pain and system upset of note the patient's lipase was 692 which with the abdominal pain fits [place] criteria for acute pancreatitis although no evidence of pancreatitis on imaging (MRCP) The patient's MRCP showed multiple gallstones without signs of acute cholecystitis although dilated CBD without a CBD stone no pancreatic duct dilation or evidence of pancreatitis Patient's ECG was reassuring twelve-lead ECG sinus rhythm with 60 ventricular rate QTc 415 without any ST changes independently reviewed and interpreted the patient's troponin was negative as well, accordingly her chest pain pain proceed was likely from her intra-abdominal concern No evidence of ascending infection patient's WBC is 8.4 she is afebrile and no signs of acute cholecystitis on MRCP Plan: We will repeat LFTs in the a.m. to ensure no new CBD blockage and resolving clinical picture, obtain acute hepatitis panel for completeness, advance diet as tolerated, as needed Zofran, as needed IV hydromorphone (patient has oxycodone allergy, morphine with sphincter of Oddi avoiding), outpatient general surgery follow-up if continues to clinically improve otherwise inpatient general surgery and gastroenterology consultation

## 2023-10-26 NOTE — ED Triage Notes (Signed)
 Pt to ED for upper abdominal and lower mid chest pain since 730pm last night which she thought was heartburn at first. Denies NVD and SOB. Endorses mild dizziness. Pt ambulatory to treatment room. Skin dry.

## 2023-10-26 NOTE — H&P (Signed)
 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  The patient denies any fever or chills but does report nausea which has improved.  She did report severe abdominal pain which has improved post medications.  Case discussed with the emergency department physician given the patient's multiple comorbidities including myasthenia gravis and advanced age coupled with her elevated liver enzymes and bilirubin and known stones in the gallbladder we Barbara observe overnight for pain control p.o. tolerance ensure there is no transient bacteremia from the transient CBD blockage and no new stone is passed/obstruction of the CBD.  If patient tolerates diet remains clinically stable and LFTs improve can be discharged tomorrow with outpatient general surgery follow-up.  Past medical records reviewed and summarized: The patient's office visit note from 2024 for December 18 patient was followed up for nonrheumatic mitral valve regurgitation tricuspid valve regurgitation as well as aortic valve insufficiency plan to continue her chronic medications as well as return in approximately 6 months time she was also noted to have hyperlipidemia with statin intolerance at that time.  Review of Systems:   Constitutional: Denies Weight Loss, Fever, Chills or Night Sweats Eyes: Denies Blurry Vision, Eye Pain or Decreased Vision ENT: Denies Sore Throat , Tinnitus, Hearing Loss Respiratory: Denies Shortness of Breath, Cough, Hemoptysis, Wheezing, Pleurisy Cardiovascular: Reports Chest Pain Denies Paroxsymal Nocturnal Dyspnea, Palpitations, Edema Gastrointestinal: Reports Nausea, Vomiting but no Diarrhea, Hematemesis, Melena Genitourinary: Denies hematuria, dysuria, hesitancy, incontinence All other systems were reviewed and are negative   Past Medical History:  Diagnosis Date   Breast cancer (HCC) 2016   Hypertension    Personal history of chemotherapy    Personal history of radiation therapy    Tricuspid valve disorder    "leaky valve on the R"   Past Surgical History:  Procedure Laterality Date   BREAST LUMPECTOMY Right 2016   REDUCTION MAMMAPLASTY Bilateral 1993   Social History:  reports that she has never smoked. She has never used smokeless tobacco. She reports that she does not currently use alcohol. She reports that she does not use drugs.  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   Celecoxib Other (See Comments)    Aggitation  Other reaction(s): Unknown  Aggitation   Esomeprazole Magnesium Diarrhea   Fd&C Yellow #5 (Tartrazine) Hives   Gabapentin Other (See Comments)    FELT UNSTEADY ON MY FEET  Other reaction(s): Unknown  FELT UNSTEADY ON MY FEET   Latanoprost Swelling    Latanoprost drops  Oxycodone Itching   Pravastatin Nausea And Vomiting   Yellow Dye Hives    Levofloxacin Palpitations    Family History  Problem Relation Age of Onset   Breast cancer Mother 34   Breast cancer Paternal Aunt     Prior to Admission medications   Medication Sig Start Date End Date Taking? Authorizing Provider  acetaminophen (TYLENOL) 500 MG tablet Take 500 mg by mouth every 4 (four) hours as needed for mild pain (pain score 1-3) or moderate pain (pain score 4-6).   Yes [provider]  aspirin EC 81 MG tablet Take 81 mg by mouth daily.   Yes [provider]  carboxymethylcellulose (REFRESH PLUS) 0.5 % SOLN Place 1-2 drops into both eyes daily as needed.   Yes [provider]  Cholecalciferol (VITAMIN D3) 10 MCG (400 UNIT) tablet Take 400 Units by mouth daily.   Yes [provider]  cyanocobalamin (VITAMIN B12) 500 MCG tablet Take 500 mcg by mouth daily.   Yes [provider]  Ferrous Sulfate Dried (SLOW IRON PO) Take 1 tablet by mouth daily.   Yes [provider]  folic acid (FOLVITE) 400 MCG tablet Take 400 mcg by mouth daily.   Yes [provider]  metoprolol succinate (TOPROL-XL) 25 MG 24 hr tablet Take 1 tablet by mouth daily. 07/31/23 07/30/24 Yes [provider]  Multiple Vitamin (MULTI-VITAMIN) tablet Take 1 tablet by mouth daily.   Yes [provider]  mycophenolate (CELLCEPT) 500 MG tablet Take 500 mg by mouth 2 (two) times daily.   Yes [provider]  NEXLIZET 180-10 MG TABS Take 1 tablet by mouth daily.   Yes [provider]  polyethylene glycol powder (GLYCOLAX/MIRALAX) 17 GM/SCOOP powder Take 1 Container by mouth daily as needed for mild constipation or moderate constipation.   Yes [provider]  pyridOXINE (VITAMIN B6) 25 MG tablet Take 25 mg by mouth daily.   Yes [provider]  Specialty Vitamins Products (BIOTIN PLUS KERATIN) 10000-100 MCG-MG TABS Take 1 tablet by mouth daily.   Yes [provider]  SYNTHROID 100 MCG tablet  Take 100 mcg by mouth daily.   Yes [provider]    Physical Exam: Vitals:   10/26/23 1800 10/26/23 1845 10/26/23 1845 10/26/23 1900  BP: (!) 108/37 (!) 115/34  (!) 122/56  Pulse: (!) 57 62  62  Resp: 17 15  (!) 25  Temp:   98 F (36.7 C)   TempSrc:   Oral   SpO2: 100% 100%  100%  Weight:      Height:      Patient seen in room 8 of the emergency department at 6: 58 PM with the patient's husband at the bedside Constitutional:  Vital Signs as per [facility] than three noted] No Acute Distress Neck:     Trachea Midline, Neck Symmetric Respiratory:   Respiratory Effort Normal: No Use of Respiratory Muscles,No  Intercostal Retractions             Lungs Clear to Auscultation Bilaterally Cardiovascular:   Heart Auscultated: Regular Regular with systolic murmur  No Lower Extremity Edema Gastrointestinal:  Abdomen soft and nontender without palpable masses, guarding or rebound  No Palpable Splenomegaly or Hepatomegaly Neurologic:  Moving all 4 limbs spontaneously to gravity Psychiatric:  Patient Orientated to Time, Place and Person Patient with appropriate mood and affect Recent and Remote Memory Intact  Data Reviewed: Labs, Radiology, ECG as detailed in HPI and A/P   Assessment  and Plan: * Gallstones, common bile duct     Suspect passed CBD stone (choledocholithiasis) Patient has total bilirubin elevation of 1.5 AST 198 ALT 118 new from 7 months ago This was accompanied by severe abdominal pain and system upset of note the patient's lipase was 692 which with the abdominal pain fits [place] criteria for acute pancreatitis although no evidence of pancreatitis on imaging (MRCP) The patient's MRCP showed multiple gallstones without signs of acute cholecystitis although dilated CBD without a CBD stone no pancreatic duct dilation or evidence of pancreatitis Patient's ECG was reassuring twelve-lead ECG sinus rhythm with 60 ventricular rate QTc 415 without any ST changes  independently reviewed and interpreted the patient's troponin was negative as well, accordingly her chest pain pain proceed was likely from her intra-abdominal concern No evidence of ascending infection patient's WBC is 8.4 she is afebrile and no signs of acute cholecystitis on MRCP Plan: We Barbara repeat LFTs in the a.m. to ensure no new CBD blockage and resolving clinical picture, obtain acute hepatitis panel for completeness, advance diet as tolerated, as needed Zofran, as needed IV hydromorphone (patient has oxycodone allergy, morphine with sphincter of Oddi avoiding), outpatient general surgery follow-up if continues to clinically improve otherwise inpatient general surgery and gastroenterology consultation  Myasthenia gravis (HCC)   Myasthenia gravis Continuing the patient's home CellCept 500 mg twice daily p.o. Given the patient's overall nontoxic we Barbara avoid respiratory mechanics at this time If the patient becomes infected or worsens we Barbara add respiratory mechanics Continue Vit D and Pyridoxine   Hypothyroidism Hypothyroidism Continue the patient's Synthroid 100 mcg daily  Hyperlipidemia  Hyperlipidemia Continuing the patient's aspirin as well as ezetimibe from home    Outpatient Vitamin Noted that the patient's outpatient biotin's from her outpatient physicians cannot be continued as inpatient as per her desire, received a pharmacist communication note and this was not confirmed, patient should continue shared decision making with her outpatient provider regarding continuation or not of this medication on discharge   Advance Care Planning:   Code Status: Full Code , discussed with patient , would like husband to be surrogate and would not want long-term life support, has an advanced directive and POA for husband   Consults: None Currently  Family Communication: Husband at Bedside  Severity of Illness: The appropriate patient status for this patient is OBSERVATION.  Observation status is judged to be reasonable and necessary in order to provide the required intensity of service to ensure the patient's safety. The patient's presenting symptoms, physical exam findings, and initial radiographic and laboratory data in the context of their medical condition is felt to place them at decreased risk for further clinical deterioration. Furthermore, it is anticipated that the patient Barbara be medically stable for discharge from the hospital within 2 midnights of admission.   Case discussed with the emergency department physician given the patient's multiple comorbidities including myasthenia gravis and advanced age coupled with her elevated liver enzymes and bilirubin and known stones in the gallbladder we Barbara observe overnight for pain control p.o. tolerance ensure there is no transient bacteremia from the transient CBD blockage and no new stone is passed/obstruction of the CBD.  If patient tolerates diet remains clinically stable and LFTs improve can be discharged tomorrow with outpatient general surgery follow-up.  Author: Princess Bruins, MD 10/26/2023 8:18 PM  For on call review [email or web address].

## 2023-10-26 NOTE — ED Notes (Signed)
 Meal tray provided with Ginger Ale per patient request.

## 2023-10-27 ENCOUNTER — Encounter: Payer: Self-pay | Admitting: Internal Medicine

## 2023-10-27 DIAGNOSIS — E039 Hypothyroidism, unspecified: Secondary | ICD-10-CM

## 2023-10-27 DIAGNOSIS — E785 Hyperlipidemia, unspecified: Secondary | ICD-10-CM | POA: Diagnosis not present

## 2023-10-27 DIAGNOSIS — K8050 Calculus of bile duct without cholangitis or cholecystitis without obstruction: Secondary | ICD-10-CM | POA: Diagnosis not present

## 2023-10-27 LAB — HEPATITIS PANEL, ACUTE
HCV Ab: NONREACTIVE
Hep A IgM: NONREACTIVE
Hep B C IgM: NONREACTIVE
Hepatitis B Surface Ag: NONREACTIVE

## 2023-10-27 LAB — CBC
HCT: 30.7 % — ABNORMAL LOW (ref 36.0–46.0)
Hemoglobin: 10.1 g/dL — ABNORMAL LOW (ref 12.0–15.0)
MCH: 29.6 pg (ref 26.0–34.0)
MCHC: 32.9 g/dL (ref 30.0–36.0)
MCV: 90 fL (ref 80.0–100.0)
Platelets: 244 10*3/uL (ref 150–400)
RBC: 3.41 MIL/uL — ABNORMAL LOW (ref 3.87–5.11)
RDW: 12.5 % (ref 11.5–15.5)
WBC: 4.8 10*3/uL (ref 4.0–10.5)
nRBC: 0 % (ref 0.0–0.2)

## 2023-10-27 LAB — COMPREHENSIVE METABOLIC PANEL
ALT: 82 U/L — ABNORMAL HIGH (ref 0–44)
AST: 86 U/L — ABNORMAL HIGH (ref 15–41)
Albumin: 3.6 g/dL (ref 3.5–5.0)
Alkaline Phosphatase: 37 U/L — ABNORMAL LOW (ref 38–126)
Anion gap: 4 — ABNORMAL LOW (ref 5–15)
BUN: 14 mg/dL (ref 8–23)
CO2: 26 mmol/L (ref 22–32)
Calcium: 8.4 mg/dL — ABNORMAL LOW (ref 8.9–10.3)
Chloride: 107 mmol/L (ref 98–111)
Creatinine, Ser: 0.69 mg/dL (ref 0.44–1.00)
GFR, Estimated: >60 mL/min (ref >60)
Glucose, Bld: 77 mg/dL (ref 70–99)
Potassium: 3.3 mmol/L — ABNORMAL LOW (ref 3.5–5.1)
Sodium: 137 mmol/L (ref 135–145)
Total Bilirubin: 0.9 mg/dL (ref 0.0–1.2)
Total Protein: 5.7 g/dL — ABNORMAL LOW (ref 6.5–8.1)

## 2023-10-27 LAB — LIPASE, BLOOD: Lipase: 150 U/L — ABNORMAL HIGH (ref 11–51)

## 2023-10-27 MED ORDER — POTASSIUM CHLORIDE 10 MEQ/100ML IV SOLN
10.0000 meq | INTRAVENOUS | Status: DC
Start: 2023-10-27 — End: 2023-10-27

## 2023-10-27 MED ORDER — POTASSIUM CHLORIDE CRYS ER 20 MEQ PO TBCR: 30.0000 meq | EXTENDED_RELEASE_TABLET | Freq: Two times a day (BID) | ORAL | Status: DC

## 2023-10-27 MED ORDER — POTASSIUM CHLORIDE CRYS ER 20 MEQ PO TBCR
30.0000 meq | EXTENDED_RELEASE_TABLET | Freq: Once | ORAL | Status: AC
  Administered 2023-10-27: 30 meq via ORAL
  Filled 2023-10-27: qty 1

## 2023-10-27 NOTE — Discharge Summary (Signed)
 Physician Discharge Summary   Patient: Barbara Chandler MRN: [id number] DOB: 23-Aug-1940  Admit date:     10/26/2023  Discharge date: 10/27/23  Discharge Physician: Kirstie Peri   PCP: Marguarite Arbour, MD   Recommendations at discharge:   Follow up with General surgery in 1 week  Follow up with PCP in 1 week   Discharge Diagnoses: Principal Problem:   Gallstones, common bile duct Active Problems:   Hyperlipidemia   Hypothyroidism   Myasthenia gravis (HCC)  Resolved Problems:   * No resolved hospital problems. 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  The patient denies any fever or chills but does report nausea which has improved.  She did report severe abdominal pain which has improved post medications.   Case discussed with the emergency department physician given the patient's multiple comorbidities including myasthenia gravis and advanced age coupled with her elevated liver enzymes and bilirubin and known stones in the gallbladder we will observe overnight for pain control p.o. tolerance ensure there is no transient bacteremia from the transient CBD blockage and no new stone is passed/obstruction of the CBD.   3/16 : On the day of discharge patient's potassium was 3.3 had improvement in lipase from 692->150, MRI was impressive of  Multiple gallstones without evidence acute cholecystitis.Dilated common bile duct without evidence of choledocholithiasis.No pancreatic duct dilatation.  No evidence of pancreatitis.   Patient was able to tolerate diet.  Was asymptomatic on the day of discharge.  No complaint of abdominal pain.  MRI with no acute finding patient was advised to follow-up with general surgery in outpatient setting.  Patient agreeable to plan.  On the day of discharge patient was hemodynamically stable with no active complaint.  Plan of discharge was discussed with nursing and patient's husband at bedside.  Advised patient to seek an urgent help if condition changes.    Assessment and Plan: Gallstones, common bile duct  Suspect passed CBD stone (choledocholithiasis) Patient has total bilirubin elevation of 1.5 AST 198 ALT 118 new from 7 months ago This was accompanied by severe abdominal pain and system upset of note the patient's lipase was 692 which with the abdominal pain fits [place] criteria for acute pancreatitis although no evidence of pancreatitis on imaging (MRCP) The patient's MRCP showed multiple gallstones without signs of acute cholecystitis although dilated CBD without a CBD stone no pancreatic duct dilation or evidence of pancreatitis Patient's ECG was reassuring twelve-lead ECG sinus rhythm with 60 ventricular rate QTc 415 without any ST changes independently reviewed and interpreted the patient's troponin was negative as well, accordingly her chest pain pain proceed was likely from her intra-abdominal concern No evidence of ascending infection patient's WBC is 8.4 she is afebrile and no signs of acute cholecystitis on MRCP Plan: We will repeat LFTs in the a.m. to ensure no new CBD blockage and resolving clinical picture, obtain acute hepatitis panel for completeness, advance diet as tolerated, as needed Zofran, as needed IV hydromorphone (patient has oxycodone allergy, morphine with sphincter of Oddi avoiding), outpatient general surgery follow-up if continues to clinically improve otherwise inpatient general surgery and gastroenterology  consultation  Myasthenia gravis  Myasthenia  gravis Continuing the patient's home CellCept 500 mg twice daily p.o. Given the patient's overall nontoxic we will avoid respiratory mechanics at this time If the patient becomes infected or worsens we will add respiratory mechanics Continue Vit D and Pyridoxine  Hypothyroidism Hypothyroidism Continue the patient's Synthroid 100 mcg daily  Hyperlipidemia  Hyperlipidemia Continuing the patient's aspirin as well as ezetimibe from home      Consultants: None  Procedures performed: None   Disposition: Home Diet recommendation:  Discharge Diet Orders (From admission, onward)     Start     Ordered   10/27/23 0000  Diet - low sodium heart healthy        10/27/23 1155           Cardiac diet DISCHARGE MEDICATION: Allergies as of 10/27/2023       Reactions   Atorvastatin Other (See Comments)   Other reaction(s): Myalgia   Brimonidine Tartrate-timolol Swelling   Statins Diarrhea, Other (See Comments), Nausea And Vomiting   Other reaction(s): Adverse reaction to substance   Myalgias lymphacytic colitis  Other reaction(s): Adverse reaction to substance   Myalgias lymphacytic colitis Muscle pain in legs and contribute to lymphocytic colitis   Celecoxib Other (See Comments)   Aggitation  Other reaction(s): Unknown Aggitation   Esomeprazole Magnesium Diarrhea   Fd&c Yellow #5 (tartrazine) Hives   Gabapentin Other (See Comments)   FELT UNSTEADY ON MY FEET  Other reaction(s): Unknown FELT UNSTEADY ON MY FEET   Latanoprost Swelling   Latanoprost drops   Oxycodone Itching   Pravastatin Nausea And Vomiting   Yellow Dye Hives   Levofloxacin Palpitations        Medication List     TAKE these medications    acetaminophen 500 MG tablet Commonly known as: TYLENOL Take 500 mg by mouth every 4 (four) hours as needed for mild pain (pain score 1-3) or moderate pain (pain score 4-6).   aspirin EC 81 MG tablet Take 81 mg by mouth daily.   Biotin Plus Keratin 10000-100  MCG-MG Tabs Take 1 tablet by mouth daily.   carboxymethylcellulose 0.5 % Soln Commonly known as: REFRESH PLUS Place 1-2 drops into both eyes daily as needed.   cyanocobalamin 500 MCG tablet Commonly known as: VITAMIN B12 Take 500 mcg by mouth daily.   folic acid 400 MCG tablet Commonly known as: FOLVITE Take 400 mcg by mouth daily.   metoprolol succinate 25 MG 24 hr tablet Commonly known as: TOPROL-XL Take 1 tablet by mouth daily.   Multi-Vitamin tablet Take 1 tablet by mouth daily.   mycophenolate 500 MG tablet Commonly known as: CELLCEPT Take 500 mg by mouth 2 (two) times daily.   Nexlizet 180-10 MG Tabs Generic drug: Bempedoic Acid-Ezetimibe Take 1 tablet by mouth daily.   polyethylene glycol powder 17 GM/SCOOP powder Commonly known as: GLYCOLAX/MIRALAX Take 1 Container by mouth daily as needed for mild constipation or moderate constipation.   pyridOXINE 25 MG tablet Commonly known as: VITAMIN B6 Take 25 mg by mouth daily.   SLOW IRON PO Take 1 tablet by mouth daily.   Synthroid 100 MCG tablet Generic drug: levothyroxine Take 100 mcg by mouth daily.   Vitamin D3 10 MCG (400 UNIT) tablet Take 400 Units by mouth daily.        Discharge Exam: Filed Weights   10/26/23 1124  Weight: 62.1 kg   Physical Exam Constitutional:      Appearance: She is well-developed.  Eyes:     Pupils: Pupils are equal, round, and reactive to light.  Cardiovascular:     Rate and Rhythm: Normal rate and regular rhythm.  Pulmonary:     Effort: Pulmonary effort is normal.  Abdominal:     Palpations: Abdomen is soft.     Tenderness: There is abdominal tenderness.  Musculoskeletal:        General: Normal range of motion.     Cervical back: Normal range of motion.  Skin:    General: Skin is warm.  Neurological:     General: No focal deficit present.     Mental Status: She is alert and oriented to person, place, and time. Mental status is at baseline.  Psychiatric:         Mood and Affect: Mood normal.        Behavior: Behavior normal.        Thought Content: Thought content normal.      Condition at discharge: good  The results of significant diagnostics from this hospitalization (including imaging, microbiology, ancillary and laboratory) are listed below for reference.   Imaging Studies: MR ABDOMEN MRCP W WO CONTAST Result Date: 10/26/2023 CLINICAL DATA:  RIGHT upper quadrant pain. Cholelithiasis on ultrasound. EXAM: MRI ABDOMEN WITHOUT AND WITH CONTRAST (INCLUDING MRCP) TECHNIQUE: Multiplanar multisequence MR imaging of the abdomen was performed both before and after the administration of intravenous contrast. Heavily T2-weighted images of the biliary and pancreatic ducts were obtained, and three-dimensional MRCP images were rendered by post processing. CONTRAST:  6mL GADAVIST GADOBUTROL 1 MMOL/ML IV SOLN COMPARISON:  Ultrasound 10/26/2023 FINDINGS: Lower chest:  Lung bases are clear. Hepatobiliary: Multiple dependent stones within the lumen the gallbladder. Gallbladder measures 4.6 cm in diameter (image 23/series 4. Stones range in size from 2-4 mm. There approximately 20 stones. Small amount pericholecystic fluid is present along the gallbladder fossa. Common bile duct is dilated to 10 mm (image 23/4. No filling defect within the common bile duct. No intrahepatic biliary duct dilatation evident. No intrahepatic biliary duct dilatation. No enhancing lesion within liver parenchyma. Pancreas: motion degradation of the pre and post contrast T1 weighted imaging of the pancreas. On the T2 weighted sequences, there is no evidence of pancreatic duct dilatation. No pancreatic inflammation on T2 weighted imaging. Spleen: Normal spleen. Adrenals/urinary tract: Kidneys and adrenal glands are normal. Stomach/Bowel: Stomach and limited of the small bowel is unremarkable Vascular/Lymphatic: Abdominal aortic normal caliber. No retroperitoneal periportal lymphadenopathy.  Musculoskeletal: No aggressive osseous lesion IMPRESSION: 1. Multiple gallstones without evidence acute cholecystitis. 2. Dilated common bile duct without evidence of choledocholithiasis. 3. No pancreatic duct dilatation.  No evidence of pancreatitis. Electronically Signed   By: Genevive Bi M.D.   On: 10/26/2023 18:28   MR 3D Recon At Scanner Result Date: 10/26/2023 CLINICAL DATA:  RIGHT upper quadrant pain. Cholelithiasis on ultrasound. EXAM: MRI ABDOMEN WITHOUT AND WITH CONTRAST (INCLUDING MRCP) TECHNIQUE: Multiplanar multisequence MR imaging of the abdomen was performed both before and after the administration of intravenous contrast. Heavily T2-weighted images of the biliary and pancreatic ducts were obtained, and three-dimensional MRCP images were rendered by post processing. CONTRAST:  6mL GADAVIST GADOBUTROL 1 MMOL/ML IV SOLN COMPARISON:  Ultrasound 10/26/2023 FINDINGS: Lower chest:  Lung bases are clear. Hepatobiliary: Multiple dependent stones within the lumen the gallbladder. Gallbladder measures 4.6 cm in diameter (image 23/series 4. Stones range in size from 2-4 mm. There approximately 20 stones. Small amount pericholecystic fluid is present along the gallbladder fossa. Common bile duct is dilated  to 10 mm (image 23/4. No filling defect within the common bile duct. No intrahepatic biliary duct dilatation evident. No intrahepatic biliary duct dilatation. No enhancing lesion within liver parenchyma. Pancreas: motion degradation of the pre and post contrast T1 weighted imaging of the pancreas. On the T2 weighted sequences, there is no evidence of pancreatic duct dilatation. No pancreatic inflammation on T2 weighted imaging. Spleen: Normal spleen. Adrenals/urinary tract: Kidneys and adrenal glands are normal. Stomach/Bowel: Stomach and limited of the small bowel is unremarkable Vascular/Lymphatic: Abdominal aortic normal caliber. No retroperitoneal periportal lymphadenopathy. Musculoskeletal: No  aggressive osseous lesion IMPRESSION: 1. Multiple gallstones without evidence acute cholecystitis. 2. Dilated common bile duct without evidence of choledocholithiasis. 3. No pancreatic duct dilatation.  No evidence of pancreatitis. Electronically Signed   By: Genevive Bi M.D.   On: 10/26/2023 18:28   US Abdomen Limited RUQ (LIVER/GB) Result Date: 10/26/2023 CLINICAL DATA:  Upper abdominal pain. EXAM: ULTRASOUND ABDOMEN LIMITED RIGHT UPPER QUADRANT COMPARISON:  None Available. FINDINGS: Gallbladder: Gallbladder is distended. There are multiple mobile dependent stones. No wall thickening or pericholecystic fluid. Common bile duct: Diameter: Dilated to 1.2 cm.  No visualized duct stone. Liver: No focal lesion identified. Within normal limits in parenchymal echogenicity. Portal vein is patent on color Doppler imaging with normal direction of blood flow towards the liver. Other: None. IMPRESSION: 1. Cholelithiasis. No evidence of acute cholecystitis. 2. Dilated common bile duct to 1.2 cm. No visualized duct stone. Consider MRCP for further assessment if there are clinical findings of duct obstruction. Electronically Signed   By: Amie Portland M.D.   On: 10/26/2023 13:16   DG Chest 2 View Result Date: 10/26/2023 CLINICAL DATA:  Chest pain. EXAM: CHEST - 2 VIEW COMPARISON:  None Available. FINDINGS: Focal nonspecific opacity along the lateral aspect of right mid lung zone may represent underlying focal scarring/fibrosis. Bilateral lung fields are otherwise clear. No dense consolidation or lung collapse. Bilateral costophrenic angles are clear. Normal cardio-mediastinal silhouette. No acute osseous abnormalities. The soft tissues are within normal limits. IMPRESSION: No active cardiopulmonary disease. Electronically Signed   By: Jules Schick M.D.   On: 10/26/2023 11:57    Microbiology: No results found for this or any previous visit.  Labs: CBC: Recent Labs  Lab 10/26/23 1323 10/26/23 2203  10/27/23 0507  WBC 8.4 5.8 4.8  HGB 11.2* 11.5* 10.1*  HCT 33.8* 34.3* 30.7*  MCV 89.9 88.9 90.0  PLT 238 258 244   Basic Metabolic Panel: Recent Labs  Lab 10/26/23 1323 10/26/23 2203 10/27/23 0507  NA 138  --  137  K 3.5  --  3.3*  CL 105  --  107  CO2 27  --  26  GLUCOSE 100*  --  77  BUN 13  --  14  CREATININE 0.72 0.73 0.69  CALCIUM 9.0  --  8.4*   Liver Function Tests: Recent Labs  Lab 10/26/23 1323 10/27/23 0507  AST 198* 86*  ALT 118* 82*  ALKPHOS 44 37*  BILITOT 1.5* 0.9  PROT 6.4* 5.7*  ALBUMIN 4.1 3.6   CBG: No results for input(s): "GLUCAP" in the last 168 hours.  Discharge time spent: greater than 30 minutes.  Signed: Kirstie Peri, MD [facility] 10/27/2023

## 2023-10-27 NOTE — Plan of Care (Signed)

## 2023-11-01 ENCOUNTER — Ambulatory Visit: Payer: Self-pay | Admitting: Surgery

## 2023-11-04 ENCOUNTER — Telehealth: Payer: Self-pay

## 2023-11-04 ENCOUNTER — Other Ambulatory Visit: Payer: Self-pay

## 2023-11-04 DIAGNOSIS — K8050 Calculus of bile duct without cholangitis or cholecystitis without obstruction: Secondary | ICD-10-CM

## 2023-11-04 NOTE — Telephone Encounter (Signed)
 Faxed referral to [facility] at [phone].

## 2024-04-09 ENCOUNTER — Other Ambulatory Visit: Payer: Self-pay | Admitting: Internal Medicine

## 2024-04-09 DIAGNOSIS — Z1231 Encounter for screening mammogram for malignant neoplasm of breast: Secondary | ICD-10-CM

## 2024-05-12 ENCOUNTER — Ambulatory Visit
Admission: RE | Admit: 2024-05-12 | Discharge: 2024-05-12 | Disposition: A | Discharge status: Home / Self Care | Source: Ambulatory Visit | Attending: Internal Medicine | Admitting: Internal Medicine

## 2024-05-12 DIAGNOSIS — Z1231 Encounter for screening mammogram for malignant neoplasm of breast: Secondary | ICD-10-CM | POA: Diagnosis present
